# Patient Record
Sex: Male | Born: 1950
Health system: Southern US, Community
[De-identification: ages and names within clinical notes are randomized; demographics above are authoritative.]

## PROBLEM LIST (undated history)

## (undated) DIAGNOSIS — G4730 Sleep apnea, unspecified: Secondary | ICD-10-CM

## (undated) DIAGNOSIS — D509 Iron deficiency anemia, unspecified: Secondary | ICD-10-CM

## (undated) DIAGNOSIS — D573 Sickle-cell trait: Secondary | ICD-10-CM

## (undated) DIAGNOSIS — T7840XA Allergy, unspecified, initial encounter: Secondary | ICD-10-CM

## (undated) DIAGNOSIS — E785 Hyperlipidemia, unspecified: Secondary | ICD-10-CM

## (undated) DIAGNOSIS — I10 Essential (primary) hypertension: Secondary | ICD-10-CM

## (undated) DIAGNOSIS — K5790 Diverticulosis of intestine, part unspecified, without perforation or abscess without bleeding: Secondary | ICD-10-CM

## (undated) DIAGNOSIS — M1990 Unspecified osteoarthritis, unspecified site: Secondary | ICD-10-CM

## (undated) DIAGNOSIS — G4733 Obstructive sleep apnea (adult) (pediatric): Secondary | ICD-10-CM

## (undated) HISTORY — PX: POLYPECTOMY: SHX149

## (undated) HISTORY — DX: Diverticulosis of intestine, part unspecified, without perforation or abscess without bleeding: K57.90

## (undated) HISTORY — DX: Allergy, unspecified, initial encounter: T78.40XA

## (undated) HISTORY — PX: COLONOSCOPY: SHX174

## (undated) HISTORY — PX: THROAT SURGERY: SHX803

## (undated) HISTORY — DX: Hyperlipidemia, unspecified: E78.5

## (undated) HISTORY — DX: Sleep apnea, unspecified: G47.30

## (undated) HISTORY — DX: Obstructive sleep apnea (adult) (pediatric): G47.33

## (undated) HISTORY — PX: ROTATOR CUFF REPAIR: SHX139

## (undated) HISTORY — DX: Essential (primary) hypertension: I10

## (undated) HISTORY — DX: Unspecified osteoarthritis, unspecified site: M19.90

## (undated) HISTORY — DX: Sickle-cell trait: D57.3

---
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
## 2004-02-01 ENCOUNTER — Ambulatory Visit: Payer: Self-pay | Admitting: Internal Medicine

## 2004-02-11 ENCOUNTER — Ambulatory Visit: Payer: Self-pay | Admitting: Internal Medicine

## 2004-08-22 ENCOUNTER — Ambulatory Visit: Payer: Self-pay

## 2004-08-22 ENCOUNTER — Ambulatory Visit: Payer: Self-pay | Admitting: Internal Medicine

## 2005-02-09 ENCOUNTER — Ambulatory Visit: Payer: Self-pay | Admitting: Internal Medicine

## 2005-02-16 ENCOUNTER — Ambulatory Visit: Payer: Self-pay | Admitting: Internal Medicine

## 2005-05-05 ENCOUNTER — Ambulatory Visit: Payer: Self-pay | Admitting: Internal Medicine

## 2005-07-03 ENCOUNTER — Ambulatory Visit: Payer: Self-pay | Admitting: Internal Medicine

## 2005-07-18 ENCOUNTER — Ambulatory Visit: Payer: Self-pay | Admitting: Internal Medicine

## 2005-07-19 ENCOUNTER — Ambulatory Visit: Payer: Self-pay | Admitting: Internal Medicine

## 2005-07-20 ENCOUNTER — Ambulatory Visit (HOSPITAL_COMMUNITY): Admission: RE | Admit: 2005-07-20 | Discharge: 2005-07-20 | Payer: Self-pay | Admitting: Internal Medicine

## 2005-08-11 ENCOUNTER — Ambulatory Visit: Payer: Self-pay | Admitting: Internal Medicine

## 2005-09-04 ENCOUNTER — Ambulatory Visit: Payer: Self-pay | Admitting: Internal Medicine

## 2006-02-12 ENCOUNTER — Ambulatory Visit: Payer: Self-pay | Admitting: Internal Medicine

## 2006-02-12 LAB — CONVERTED CEMR LAB
Albumin: 4 g/dL (ref 3.5–5.2)
Basophils Absolute: 0 10*3/uL (ref 0.0–0.1)
Basophils Relative: 0.5 % (ref 0.0–1.0)
CO2: 28 meq/L (ref 19–32)
Creatinine, Ser: 1.2 mg/dL (ref 0.4–1.5)
GFR calc non Af Amer: 66 mL/min
Glucose, Bld: 96 mg/dL (ref 70–99)
HCT: 36.1 % — ABNORMAL LOW (ref 39.0–52.0)
HDL: 26.2 mg/dL — ABNORMAL LOW (ref >39.0)
Hemoglobin, Urine: NEGATIVE
Hemoglobin: 12.1 g/dL — ABNORMAL LOW (ref 13.0–17.0)
MCV: 86.6 fL (ref 78.0–100.0)
Monocytes Absolute: 0.3 10*3/uL (ref 0.2–0.7)
Monocytes Relative: 7.6 % (ref 3.0–11.0)
PSA: 2.37 ng/mL (ref 0.10–4.00)
Potassium: 3.8 meq/L (ref 3.5–5.1)
RDW: 12.8 % (ref 11.5–14.6)
Sodium: 144 meq/L (ref 135–145)
TSH: 0.73 microintl units/mL (ref 0.35–5.50)
Total Bilirubin: 0.6 mg/dL (ref 0.3–1.2)
Total Protein, Urine: NEGATIVE mg/dL
Total Protein: 6.9 g/dL (ref 6.0–8.3)
WBC: 3.8 10*3/uL — ABNORMAL LOW (ref 4.5–10.5)

## 2006-02-19 ENCOUNTER — Ambulatory Visit: Payer: Self-pay | Admitting: Internal Medicine

## 2006-03-01 ENCOUNTER — Ambulatory Visit: Payer: Self-pay

## 2006-10-31 ENCOUNTER — Encounter: Payer: Self-pay | Admitting: Internal Medicine

## 2006-10-31 DIAGNOSIS — I10 Essential (primary) hypertension: Secondary | ICD-10-CM | POA: Insufficient documentation

## 2007-02-22 ENCOUNTER — Ambulatory Visit: Payer: Self-pay | Admitting: Internal Medicine

## 2007-02-22 LAB — CONVERTED CEMR LAB
ALT: 27 units/L (ref 0–53)
Albumin: 3.9 g/dL (ref 3.5–5.2)
Alkaline Phosphatase: 60 units/L (ref 39–117)
BUN: 14 mg/dL (ref 6–23)
CO2: 31 meq/L (ref 19–32)
Cholesterol: 129 mg/dL (ref 0–200)
Eosinophils Relative: 2.5 % (ref 0.0–5.0)
GFR calc Af Amer: 88 mL/min
Glucose, Bld: 102 mg/dL — ABNORMAL HIGH (ref 70–99)
HDL: 15.6 mg/dL — ABNORMAL LOW (ref >39.0)
Hemoglobin, Urine: NEGATIVE
Ketones, ur: NEGATIVE mg/dL
LDL Cholesterol: 90 mg/dL (ref 0–99)
Neutro Abs: 2.7 10*3/uL (ref 1.4–7.7)
Neutrophils Relative %: 57.4 % (ref 43.0–77.0)
Potassium: 3.8 meq/L (ref 3.5–5.1)
RBC: 4.31 M/uL (ref 4.22–5.81)
Sodium: 143 meq/L (ref 135–145)
Specific Gravity, Urine: 1.015 (ref 1.000–1.03)
TSH: 0.82 microintl units/mL (ref 0.35–5.50)
Total Protein, Urine: NEGATIVE mg/dL
Total Protein: 7.1 g/dL (ref 6.0–8.3)
Triglycerides: 115 mg/dL (ref 0–149)
Urobilinogen, UA: 0.2 (ref 0.0–1.0)
WBC: 4.7 10*3/uL (ref 4.5–10.5)
pH: 6 (ref 5.0–8.0)

## 2007-03-08 ENCOUNTER — Ambulatory Visit: Payer: Self-pay | Admitting: Internal Medicine

## 2007-03-09 DIAGNOSIS — I872 Venous insufficiency (chronic) (peripheral): Secondary | ICD-10-CM | POA: Insufficient documentation

## 2007-03-09 DIAGNOSIS — K5730 Diverticulosis of large intestine without perforation or abscess without bleeding: Secondary | ICD-10-CM | POA: Insufficient documentation

## 2007-03-09 DIAGNOSIS — Z860100 Personal history of colon polyps, unspecified: Secondary | ICD-10-CM | POA: Insufficient documentation

## 2007-03-09 DIAGNOSIS — I739 Peripheral vascular disease, unspecified: Secondary | ICD-10-CM

## 2007-03-13 ENCOUNTER — Telehealth: Payer: Self-pay | Admitting: Internal Medicine

## 2007-05-01 ENCOUNTER — Ambulatory Visit: Payer: Self-pay | Admitting: Internal Medicine

## 2007-05-01 DIAGNOSIS — J310 Chronic rhinitis: Secondary | ICD-10-CM | POA: Insufficient documentation

## 2007-05-01 DIAGNOSIS — J309 Allergic rhinitis, unspecified: Secondary | ICD-10-CM | POA: Insufficient documentation

## 2007-07-02 ENCOUNTER — Telehealth: Payer: Self-pay | Admitting: Endocrinology

## 2007-08-20 ENCOUNTER — Telehealth (INDEPENDENT_AMBULATORY_CARE_PROVIDER_SITE_OTHER): Payer: Self-pay | Admitting: *Deleted

## 2007-08-20 DIAGNOSIS — R5381 Other malaise: Secondary | ICD-10-CM

## 2007-08-20 DIAGNOSIS — R5383 Other fatigue: Secondary | ICD-10-CM | POA: Insufficient documentation

## 2007-08-26 ENCOUNTER — Ambulatory Visit: Payer: Self-pay | Admitting: Internal Medicine

## 2007-08-26 LAB — CONVERTED CEMR LAB
BUN: 19 mg/dL (ref 6–23)
CO2: 26 meq/L (ref 19–32)
Chloride: 111 meq/L (ref 96–112)
Cholesterol: 123 mg/dL (ref 0–200)
Creatinine, Ser: 1.2 mg/dL (ref 0.4–1.5)
GFR calc Af Amer: 80 mL/min
Glucose, Bld: 103 mg/dL — ABNORMAL HIGH (ref 70–99)
Sodium: 144 meq/L (ref 135–145)
Total CHOL/HDL Ratio: 6.9
Triglycerides: 184 mg/dL — ABNORMAL HIGH (ref 0–149)
VLDL: 37 mg/dL (ref 0–40)

## 2007-09-04 ENCOUNTER — Ambulatory Visit: Payer: Self-pay | Admitting: Internal Medicine

## 2007-09-04 DIAGNOSIS — R351 Nocturia: Secondary | ICD-10-CM | POA: Insufficient documentation

## 2007-09-04 DIAGNOSIS — E782 Mixed hyperlipidemia: Secondary | ICD-10-CM | POA: Insufficient documentation

## 2007-09-04 DIAGNOSIS — E785 Hyperlipidemia, unspecified: Secondary | ICD-10-CM

## 2007-10-22 ENCOUNTER — Ambulatory Visit: Payer: Self-pay | Admitting: Internal Medicine

## 2007-10-22 DIAGNOSIS — T07XXXA Unspecified multiple injuries, initial encounter: Secondary | ICD-10-CM | POA: Insufficient documentation

## 2007-10-30 ENCOUNTER — Ambulatory Visit: Payer: Self-pay | Admitting: Internal Medicine

## 2007-10-30 DIAGNOSIS — L02619 Cutaneous abscess of unspecified foot: Secondary | ICD-10-CM | POA: Insufficient documentation

## 2007-10-30 DIAGNOSIS — L03119 Cellulitis of unspecified part of limb: Secondary | ICD-10-CM

## 2007-10-30 DIAGNOSIS — H538 Other visual disturbances: Secondary | ICD-10-CM | POA: Insufficient documentation

## 2007-10-30 DIAGNOSIS — M109 Gout, unspecified: Secondary | ICD-10-CM | POA: Insufficient documentation

## 2007-10-31 LAB — CONVERTED CEMR LAB: Uric Acid, Serum: 6.4 mg/dL (ref 4.0–7.8)

## 2007-11-05 ENCOUNTER — Encounter: Payer: Self-pay | Admitting: Internal Medicine

## 2007-11-06 ENCOUNTER — Ambulatory Visit: Payer: Self-pay | Admitting: Internal Medicine

## 2007-11-06 DIAGNOSIS — M25579 Pain in unspecified ankle and joints of unspecified foot: Secondary | ICD-10-CM | POA: Insufficient documentation

## 2007-12-12 ENCOUNTER — Encounter: Payer: Self-pay | Admitting: Internal Medicine

## 2008-02-28 ENCOUNTER — Encounter: Payer: Self-pay | Admitting: Internal Medicine

## 2008-02-28 ENCOUNTER — Telehealth (INDEPENDENT_AMBULATORY_CARE_PROVIDER_SITE_OTHER): Payer: Self-pay | Admitting: *Deleted

## 2008-03-09 ENCOUNTER — Ambulatory Visit (HOSPITAL_COMMUNITY): Admission: RE | Admit: 2008-03-09 | Discharge: 2008-03-10 | Payer: Self-pay | Admitting: Orthopedic Surgery

## 2008-03-19 ENCOUNTER — Ambulatory Visit: Payer: Self-pay | Admitting: Internal Medicine

## 2008-03-19 LAB — CONVERTED CEMR LAB
ALT: 18 units/L (ref 0–53)
Albumin: 4 g/dL (ref 3.5–5.2)
Alkaline Phosphatase: 65 units/L (ref 39–117)
Bilirubin Urine: NEGATIVE
Chloride: 101 meq/L (ref 96–112)
GFR calc Af Amer: 80 mL/min
GFR calc non Af Amer: 66 mL/min
HDL: 18.7 mg/dL — ABNORMAL LOW (ref >39.0)
Hemoglobin, Urine: NEGATIVE
Hemoglobin: 13.3 g/dL (ref 13.0–17.0)
LDL Cholesterol: 95 mg/dL (ref 0–99)
MCV: 90.4 fL (ref 78.0–100.0)
Nitrite: NEGATIVE
PSA: 2.27 ng/mL (ref 0.10–4.00)
Platelets: 208 10*3/uL (ref 150–400)
Specific Gravity, Urine: 1.015 (ref 1.000–1.03)
TSH: 0.8 microintl units/mL (ref 0.35–5.50)
Total Protein: 7.4 g/dL (ref 6.0–8.3)
Urobilinogen, UA: 0.2 (ref 0.0–1.0)
WBC: 4.4 10*3/uL — ABNORMAL LOW (ref 4.5–10.5)
pH: 5.5 (ref 5.0–8.0)

## 2008-03-26 ENCOUNTER — Encounter: Admission: RE | Admit: 2008-03-26 | Discharge: 2008-06-24 | Payer: Self-pay | Admitting: Orthopedic Surgery

## 2008-03-30 ENCOUNTER — Ambulatory Visit: Payer: Self-pay | Admitting: Internal Medicine

## 2008-06-25 ENCOUNTER — Encounter: Admission: RE | Admit: 2008-06-25 | Discharge: 2008-07-03 | Payer: Self-pay | Admitting: Orthopedic Surgery

## 2009-03-24 ENCOUNTER — Ambulatory Visit: Payer: Self-pay | Admitting: Internal Medicine

## 2009-03-24 LAB — CONVERTED CEMR LAB
ALT: 29 units/L (ref 0–53)
Albumin: 4.1 g/dL (ref 3.5–5.2)
Calcium: 9 mg/dL (ref 8.4–10.5)
Chloride: 108 meq/L (ref 96–112)
Cholesterol: 131 mg/dL (ref 0–200)
Creatinine, Ser: 1.4 mg/dL (ref 0.4–1.5)
Direct LDL: 79.2 mg/dL
GFR calc non Af Amer: 66.34 mL/min (ref >60)
Glucose, Bld: 105 mg/dL — ABNORMAL HIGH (ref 70–99)
HCT: 40.4 % (ref 39.0–52.0)
Hemoglobin, Urine: NEGATIVE
Hemoglobin: 13.4 g/dL (ref 13.0–17.0)
Lymphocytes Relative: 42.9 % (ref 12.0–46.0)
Lymphs Abs: 1.5 10*3/uL (ref 0.7–4.0)
MCHC: 33.3 g/dL (ref 30.0–36.0)
Neutro Abs: 1.6 10*3/uL (ref 1.4–7.7)
Nitrite: NEGATIVE
Potassium: 4.1 meq/L (ref 3.5–5.1)
RBC: 4.34 M/uL (ref 4.22–5.81)
Total Bilirubin: 0.9 mg/dL (ref 0.3–1.2)
Total CHOL/HDL Ratio: 6
Total Protein, Urine: NEGATIVE mg/dL
Total Protein: 7.3 g/dL (ref 6.0–8.3)
Urobilinogen, UA: 0.2 (ref 0.0–1.0)
VLDL: 40.4 mg/dL — ABNORMAL HIGH (ref 0.0–40.0)
WBC: 3.5 10*3/uL — ABNORMAL LOW (ref 4.5–10.5)
pH: 5.5 (ref 5.0–8.0)

## 2009-04-01 ENCOUNTER — Ambulatory Visit: Payer: Self-pay | Admitting: Internal Medicine

## 2009-04-01 DIAGNOSIS — M549 Dorsalgia, unspecified: Secondary | ICD-10-CM | POA: Insufficient documentation

## 2009-04-02 ENCOUNTER — Encounter: Payer: Self-pay | Admitting: Internal Medicine

## 2009-04-27 ENCOUNTER — Encounter: Payer: Self-pay | Admitting: Internal Medicine

## 2009-04-27 ENCOUNTER — Ambulatory Visit: Payer: Self-pay

## 2009-05-04 ENCOUNTER — Telehealth: Payer: Self-pay | Admitting: Internal Medicine

## 2009-05-26 ENCOUNTER — Ambulatory Visit: Payer: Self-pay | Admitting: Internal Medicine

## 2009-05-26 DIAGNOSIS — L989 Disorder of the skin and subcutaneous tissue, unspecified: Secondary | ICD-10-CM | POA: Insufficient documentation

## 2009-05-26 DIAGNOSIS — R6882 Decreased libido: Secondary | ICD-10-CM | POA: Insufficient documentation

## 2010-03-04 ENCOUNTER — Ambulatory Visit: Payer: Self-pay | Admitting: Internal Medicine

## 2010-03-04 LAB — CONVERTED CEMR LAB
ALT: 20 units/L (ref 0–53)
AST: 20 units/L (ref 0–37)
BUN: 17 mg/dL (ref 6–23)
Basophils Absolute: 0 10*3/uL (ref 0.0–0.1)
Basophils Relative: 0.4 % (ref 0.0–3.0)
Bilirubin Urine: NEGATIVE
Blood, UA: NEGATIVE
CO2: 27 meq/L (ref 19–32)
Calcium: 8.8 mg/dL (ref 8.4–10.5)
Chloride: 107 meq/L (ref 96–112)
Cholesterol: 152 mg/dL (ref 0–200)
GFR calc non Af Amer: 74.69 mL/min (ref >60.00)
Glucose, Bld: 81 mg/dL (ref 70–99)
HDL: 25.8 mg/dL — ABNORMAL LOW (ref >39.00)
Hemoglobin: 12.8 g/dL — ABNORMAL LOW (ref 13.0–17.0)
Ketones, ur: NEGATIVE mg/dL
Leukocytes, UA: NEGATIVE
Lymphocytes Relative: 32.9 % (ref 12.0–46.0)
Monocytes Absolute: 0.3 10*3/uL (ref 0.1–1.0)
Potassium: 3.9 meq/L (ref 3.5–5.1)
RBC: 4.27 M/uL (ref 4.22–5.81)
RDW: 14.2 % (ref 11.5–14.6)
Specific Gravity, Urine: 1.01 (ref 1.000–1.030)
Total Bilirubin: 0.5 mg/dL (ref 0.3–1.2)
Total CHOL/HDL Ratio: 6
Total Protein: 6.7 g/dL (ref 6.0–8.3)
Urine Glucose: NEGATIVE mg/dL
Urobilinogen, UA: 0.2 (ref 0.0–1.0)
pH: 5.5 (ref 5.0–8.0)
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
## 2010-04-12 NOTE — Progress Notes (Signed)
Summary: rx alt  Phone Note Call from Patient Call back at Home Phone [phone]   Caller: Patient [phone] Summary of Call: pt called stating that [facility] [address] told pt that generic Colchicine is no longer available and pt cannot afford brand name Colcrys. pt is requesting alternative, has enough to lalst until MDs return. Initial call taken by: Margaret Pyle, CMA,  May 04, 2009 3:17 PM  Follow-up for Phone Call        ok to increase the allopurinol 100 mg - 2 per day - done escript Follow-up by: Corwin Levins MD,  May 04, 2009 5:29 PM  Additional Follow-up for Phone Call Additional follow up Details #1::        pt informed Additional Follow-up by: Margaret Pyle, CMA,  May 05, 2009 8:14 AM    New/Updated Medications: ALLOPURINOL 100 MG  TABS (ALLOPURINOL) Take 2 tablet by mouth once a day Prescriptions: ALLOPURINOL 100 MG  TABS (ALLOPURINOL) Take 2 tablet by mouth once a day  #60 x 11   Entered and Authorized by:   Corwin Levins MD   Signed by:   Corwin Levins MD on 05/04/2009   Method used:   Electronically to        [facility]  [address] # [id number]* (retail)       [address]       [place], [place]  [address]       Ph: [phone] or [phone]       Fax: [phone]   RxID:   [id number]
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
## 2010-04-12 NOTE — Miscellaneous (Signed)
Summary: Orders Update  Clinical Lists Changes  Orders: Added new Test order of Carotid Duplex (Carotid Duplex) - Signed 

## 2010-04-12 NOTE — Progress Notes (Signed)
Summary: PSA readings/Patient  PSA readings/Patient   Imported By: Sherian Rein 04/01/2009 14:55:22  _____________________________________________________________________  External Attachment:    Type:   Image     Comment:   External Document
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
## 2010-07-19 ENCOUNTER — Other Ambulatory Visit: Payer: Self-pay | Admitting: Internal Medicine
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
## 2010-08-20 ENCOUNTER — Encounter: Payer: Self-pay | Admitting: Internal Medicine

## 2010-09-12 ENCOUNTER — Ambulatory Visit: Payer: Managed Care, Other (non HMO) | Attending: Orthopedic Surgery | Admitting: Physical Therapy

## 2010-09-12 DIAGNOSIS — M2560 Stiffness of unspecified joint, not elsewhere classified: Secondary | ICD-10-CM | POA: Insufficient documentation

## 2010-09-12 DIAGNOSIS — IMO0001 Reserved for inherently not codable concepts without codable children: Secondary | ICD-10-CM | POA: Insufficient documentation

## 2010-09-12 DIAGNOSIS — M25519 Pain in unspecified shoulder: Secondary | ICD-10-CM | POA: Insufficient documentation

## 2010-09-15 ENCOUNTER — Ambulatory Visit: Payer: Managed Care, Other (non HMO) | Admitting: Physical Therapy

## 2010-09-20 ENCOUNTER — Ambulatory Visit: Payer: Managed Care, Other (non HMO) | Admitting: Physical Therapy

## 2010-09-22 ENCOUNTER — Ambulatory Visit: Payer: Managed Care, Other (non HMO) | Admitting: Physical Therapy

## 2010-09-26 ENCOUNTER — Ambulatory Visit: Payer: Managed Care, Other (non HMO) | Admitting: Physical Therapy

## 2010-09-28 ENCOUNTER — Ambulatory Visit: Payer: Managed Care, Other (non HMO) | Admitting: Physical Therapy

## 2010-10-06 ENCOUNTER — Ambulatory Visit: Payer: Managed Care, Other (non HMO) | Admitting: Physical Therapy

## 2010-10-10 ENCOUNTER — Encounter: Payer: Managed Care, Other (non HMO) | Admitting: Physical Therapy

## 2010-12-16 LAB — CBC
HCT: 40.9 % (ref 39.0–52.0)
Hemoglobin: 12.8 g/dL — ABNORMAL LOW (ref 13.0–17.0)
MCHC: 33.4 g/dL (ref 30.0–36.0)
MCHC: 33.9 g/dL (ref 30.0–36.0)
MCV: 91.1 fL (ref 78.0–100.0)
Platelets: 141 10*3/uL — ABNORMAL LOW (ref 150–400)
Platelets: 157 10*3/uL (ref 150–400)
RBC: 4.51 MIL/uL (ref 4.22–5.81)
RDW: 13.3 % (ref 11.5–15.5)

## 2010-12-16 LAB — URINALYSIS, ROUTINE W REFLEX MICROSCOPIC
Nitrite: NEGATIVE
Urobilinogen, UA: 0.2 mg/dL (ref 0.0–1.0)

## 2010-12-16 LAB — BASIC METABOLIC PANEL
BUN: 12 mg/dL (ref 6–23)
Calcium: 9.2 mg/dL (ref 8.4–10.5)
Creatinine, Ser: 1.21 mg/dL (ref 0.4–1.5)
Creatinine, Ser: 1.37 mg/dL (ref 0.4–1.5)
GFR calc Af Amer: >60 mL/min (ref >60)
GFR calc non Af Amer: 53 mL/min — ABNORMAL LOW (ref >60)
Glucose, Bld: 121 mg/dL — ABNORMAL HIGH (ref 70–99)
Sodium: 143 mEq/L (ref 135–145)

## 2010-12-16 LAB — DIFFERENTIAL
Basophils Absolute: 0 10*3/uL (ref 0.0–0.1)
Eosinophils Relative: 3 % (ref 0–5)
Lymphs Abs: 1.7 10*3/uL (ref 0.7–4.0)
Monocytes Absolute: 0.3 10*3/uL (ref 0.1–1.0)
Monocytes Relative: 6 % (ref 3–12)
Neutrophils Relative %: 50 % (ref 43–77)

## 2010-12-16 LAB — PROTIME-INR: INR: 1 (ref 0.00–1.49)

## 2011-01-09 ENCOUNTER — Encounter: Payer: Self-pay | Admitting: Internal Medicine

## 2011-01-11 ENCOUNTER — Other Ambulatory Visit: Payer: Self-pay | Admitting: Internal Medicine

## 2011-01-17 ENCOUNTER — Telehealth: Payer: Self-pay | Admitting: Internal Medicine

## 2011-01-17 DIAGNOSIS — Z1289 Encounter for screening for malignant neoplasm of other sites: Secondary | ICD-10-CM

## 2011-01-17 DIAGNOSIS — Z0000 Encounter for general adult medical examination without abnormal findings: Secondary | ICD-10-CM

## 2011-01-17 NOTE — Telephone Encounter (Signed)
Put order in for physical labs. 

## 2011-01-24 ENCOUNTER — Ambulatory Visit (AMBULATORY_SURGERY_CENTER): Payer: Managed Care, Other (non HMO)

## 2011-01-24 VITALS — Ht 70.0 in | Wt 286.6 lb

## 2011-01-24 DIAGNOSIS — Z1211 Encounter for screening for malignant neoplasm of colon: Secondary | ICD-10-CM

## 2011-01-24 DIAGNOSIS — Z86010 Personal history of colonic polyps: Secondary | ICD-10-CM

## 2011-01-24 MED ORDER — PEG-KCL-NACL-NASULF-NA ASC-C 100 G PO SOLR: 1.0000 | Freq: Once | ORAL | Status: AC

## 2011-02-13 ENCOUNTER — Other Ambulatory Visit (INDEPENDENT_AMBULATORY_CARE_PROVIDER_SITE_OTHER): Payer: Managed Care, Other (non HMO)

## 2011-02-13 ENCOUNTER — Ambulatory Visit (AMBULATORY_SURGERY_CENTER): Payer: Managed Care, Other (non HMO) | Admitting: Internal Medicine

## 2011-02-13 ENCOUNTER — Encounter: Payer: Self-pay | Admitting: Internal Medicine

## 2011-02-13 VITALS — BP 140/76 | HR 52 | Temp 97.6°F | Resp 14 | Ht 70.0 in | Wt 286.0 lb

## 2011-02-13 DIAGNOSIS — K5730 Diverticulosis of large intestine without perforation or abscess without bleeding: Secondary | ICD-10-CM

## 2011-02-13 DIAGNOSIS — Z0000 Encounter for general adult medical examination without abnormal findings: Secondary | ICD-10-CM

## 2011-02-13 DIAGNOSIS — Z1289 Encounter for screening for malignant neoplasm of other sites: Secondary | ICD-10-CM

## 2011-02-13 DIAGNOSIS — Z86010 Personal history of colonic polyps: Secondary | ICD-10-CM

## 2011-02-13 DIAGNOSIS — Z1211 Encounter for screening for malignant neoplasm of colon: Secondary | ICD-10-CM

## 2011-02-13 LAB — CBC WITH DIFFERENTIAL/PLATELET
Basophils Relative: 0.5 % (ref 0.0–3.0)
Eosinophils Absolute: 0.1 10*3/uL (ref 0.0–0.7)
Eosinophils Relative: 1.6 % (ref 0.0–5.0)
HCT: 41.8 % (ref 39.0–52.0)
Lymphs Abs: 1.8 10*3/uL (ref 0.7–4.0)
MCHC: 34 g/dL (ref 30.0–36.0)
MCV: 90.8 fl (ref 78.0–100.0)
Monocytes Absolute: 0.3 10*3/uL (ref 0.1–1.0)
Neutrophils Relative %: 43.8 % (ref 43.0–77.0)
Platelets: 160 10*3/uL (ref 150.0–400.0)
RBC: 4.6 Mil/uL (ref 4.22–5.81)
WBC: 3.9 10*3/uL — ABNORMAL LOW (ref 4.5–10.5)

## 2011-02-13 LAB — URINALYSIS, ROUTINE W REFLEX MICROSCOPIC
Ketones, ur: NEGATIVE
Leukocytes, UA: NEGATIVE
Specific Gravity, Urine: 1.01 (ref 1.000–1.030)
Urobilinogen, UA: 0.2 (ref 0.0–1.0)

## 2011-02-13 LAB — BASIC METABOLIC PANEL
BUN: 12 mg/dL (ref 6–23)
Calcium: 8.9 mg/dL (ref 8.4–10.5)
GFR: 78.02 mL/min (ref >60.00)
Glucose, Bld: 88 mg/dL (ref 70–99)

## 2011-02-13 LAB — LIPID PANEL
Cholesterol: 143 mg/dL (ref 0–200)
LDL Cholesterol: 89 mg/dL (ref 0–99)
Total CHOL/HDL Ratio: 6

## 2011-02-13 LAB — TSH: TSH: 0.67 u[IU]/mL (ref 0.35–5.50)

## 2011-02-13 LAB — HEPATIC FUNCTION PANEL
Albumin: 4.2 g/dL (ref 3.5–5.2)
Total Bilirubin: 1.2 mg/dL (ref 0.3–1.2)

## 2011-02-13 MED ORDER — SODIUM CHLORIDE 0.9 % IV SOLN: 500.0000 mL | INTRAVENOUS | Status: DC

## 2011-02-13 NOTE — Progress Notes (Signed)
Patient did not experience any of the following events: a burn prior to discharge; a fall within the facility; wrong site/side/patient/procedure/implant event; or a hospital transfer or hospital admission upon discharge from the facility. (G8907) Patient did not have preoperative order for IV antibiotic SSI prophylaxis. (G8918)  

## 2011-02-13 NOTE — Patient Instructions (Signed)
Please refer to blue and green discharge instruction sheets. 

## 2011-02-14 ENCOUNTER — Telehealth: Payer: Self-pay

## 2011-02-14 NOTE — Telephone Encounter (Signed)

## 2011-02-28 ENCOUNTER — Encounter: Payer: Self-pay | Admitting: Internal Medicine

## 2011-02-28 DIAGNOSIS — Z0000 Encounter for general adult medical examination without abnormal findings: Secondary | ICD-10-CM | POA: Insufficient documentation

## 2011-03-06 ENCOUNTER — Encounter: Payer: Self-pay | Admitting: Internal Medicine

## 2011-03-06 ENCOUNTER — Ambulatory Visit (INDEPENDENT_AMBULATORY_CARE_PROVIDER_SITE_OTHER): Payer: Managed Care, Other (non HMO) | Admitting: Internal Medicine

## 2011-03-06 VITALS — BP 124/86 | HR 61 | Temp 98.1°F | Ht 69.0 in | Wt 284.4 lb

## 2011-03-06 DIAGNOSIS — I10 Essential (primary) hypertension: Secondary | ICD-10-CM

## 2011-03-06 DIAGNOSIS — Z0000 Encounter for general adult medical examination without abnormal findings: Secondary | ICD-10-CM

## 2011-03-06 MED ORDER — SILDENAFIL CITRATE 100 MG PO TABS: 100.0000 mg | ORAL_TABLET | ORAL | Status: DC | PRN

## 2011-03-06 MED ORDER — FLUTICASONE PROPIONATE 50 MCG/ACT NA SUSP: 2.0000 | Freq: Every day | NASAL | Status: DC

## 2011-03-06 NOTE — Patient Instructions (Signed)
Continue all other medications as before Your refills were sent as requested Please have the pharmacy call if you need further refills Please return in 1 year for your yearly visit, or sooner if needed, with Lab testing done 3-5 days before

## 2011-03-06 NOTE — Assessment & Plan Note (Addendum)

## 2011-03-07 ENCOUNTER — Encounter: Payer: Self-pay | Admitting: Internal Medicine

## 2011-03-07 NOTE — Assessment & Plan Note (Signed)
stable overall by hx and exam, most recent data reviewed with pt, and pt to continue medical treatment as before  BP Readings from Last 3 Encounters:  03/06/11 124/86  02/13/11 140/76  03/04/10 120/84

## 2011-03-07 NOTE — Progress Notes (Signed)
Subjective:    Patient ID: Calvin [name], male    DOB: [birth year], 60 y.o.   MRN: [id number]  HPI  Here for wellness and f/u;  Overall doing ok;  Pt denies CP, worsening SOB, DOE, wheezing, orthopnea, PND, worsening LE edema, palpitations, dizziness or syncope.  Pt denies neurological change such as new Headache, facial or extremity weakness.  Pt denies polydipsia, polyuria, or low sugar symptoms. Pt states overall good compliance with treatment and medications, good tolerability, and trying to follow lower cholesterol diet.  Pt denies worsening depressive symptoms, suicidal ideation or panic. No fever, wt loss, night sweats, loss of appetite, or other constitutional symptoms.  Pt states good ability with ADL's, low fall risk, home safety reviewed and adequate, no significant changes in hearing or vision, and occasionally active with exercise.  Needs flonase refill, and tx recently with colcrys for gout. Past Medical History  Diagnosis Date  . Hyperlipidemia   . Hypertension   . Gout   . Diverticulosis    Past Surgical History  Procedure Date  . Rotator cuff repair     left  . Throat surgery     polyps removed from vocal cord  . Polypectomy   . Colonoscopy     reports that he quit smoking about 33 years ago. He has never used smokeless tobacco. He reports that he does not drink alcohol or use illicit drugs. family history is not on file. No Known Allergies Current Outpatient Prescriptions on File Prior to Visit  Medication Sig Dispense Refill  . allopurinol (ZYLOPRIM) 100 MG tablet Take 100 mg by mouth daily. Take 2 tabs daily       . aspirin 81 MG tablet Take 81 mg by mouth daily.        . Coenzyme Q10 (CO Q 10 PO) Take by mouth daily.        . fish oil-omega-3 fatty acids 1000 MG capsule Take 2 g by mouth daily.        Marland Kitchen glucosamine-chondroitin 500-400 MG tablet Take 1 tablet by mouth daily.        . indomethacin (INDOCIN) 50 MG capsule Take 50 mg by mouth 3 (three) times daily as  needed.        . meloxicam (MOBIC) 15 MG tablet Take 15 mg by mouth daily.        Marland Kitchen olmesartan-hydrochlorothiazide (BENICAR HCT) 40-12.5 MG per tablet Take 1 tablet by mouth daily.        . pravastatin (PRAVACHOL) 40 MG tablet Take 40 mg by mouth daily.        . verapamil (VERELAN PM) 200 MG 24 hr capsule Take 200 mg by mouth at bedtime.        . vitamin C (ASCORBIC ACID) 500 MG tablet Take 500 mg by mouth daily.         Current Facility-Administered Medications on File Prior to Visit  Medication Dose Route Frequency Provider Last Rate Last Dose  . 0.9 %  sodium chloride infusion  500 mL Intravenous Continuous Yancey Flemings, MD       Review of Systems Review of Systems  Constitutional: Negative for diaphoresis, activity change, appetite change and unexpected weight change.  HENT: Negative for hearing loss, ear pain, facial swelling, mouth sores and neck stiffness.   Eyes: Negative for pain, redness and visual disturbance.  Respiratory: Negative for shortness of breath and wheezing.   Cardiovascular: Negative for chest pain and palpitations.  Gastrointestinal: Negative for diarrhea, blood in stool,  abdominal distention and rectal pain.  Genitourinary: Negative for hematuria, flank pain and decreased urine volume.  Musculoskeletal: Negative for myalgias and joint swelling.  Skin: Negative for color change and wound.  Neurological: Negative for syncope and numbness.  Hematological: Negative for adenopathy.  Psychiatric/Behavioral: Negative for hallucinations, self-injury, decreased concentration and agitation.      Objective:   Physical Exam BP 124/86  Pulse 61  Temp(Src) 98.1 F (36.7 C) (Oral)  Ht 5\' 9"  (1.753 m)  Wt 284 lb 6 oz (128.992 kg)  BMI 41.99 kg/m2  SpO2 97% Physical Exam  VS noted Constitutional: Pt is oriented to person, place, and time. Appears well-developed and well-nourished.  HENT:  Head: Normocephalic and atraumatic.  Right Ear: External ear normal.  Left Ear:  External ear normal.  Nose: Nose normal.  Mouth/Throat: Oropharynx is clear and moist.  Eyes: Conjunctivae and EOM are normal. Pupils are equal, round, and reactive to light.  Neck: Normal range of motion. Neck supple. No JVD present. No tracheal deviation present.  Cardiovascular: Normal rate, regular rhythm, normal heart sounds and intact distal pulses.   Pulmonary/Chest: Effort normal and breath sounds normal.  Abdominal: Soft. Bowel sounds are normal. There is no tenderness.  Musculoskeletal: Normal range of motion. Exhibits no edema.  Lymphadenopathy:  Has no cervical adenopathy.  Neurological: Pt is alert and oriented to person, place, and time. Pt has normal reflexes. No cranial nerve deficit.  Skin: Skin is warm and dry. No rash noted.  Psychiatric:  Has  normal mood and affect. Behavior is normal.     Assessment & Plan:

## 2011-03-10 ENCOUNTER — Other Ambulatory Visit: Payer: Self-pay | Admitting: *Deleted

## 2011-03-10 MED ORDER — OLMESARTAN MEDOXOMIL-HCTZ 40-12.5 MG PO TABS: 1.0000 | ORAL_TABLET | Freq: Every day | ORAL | Status: DC

## 2011-03-10 MED ORDER — ALLOPURINOL 100 MG PO TABS: 200.0000 mg | ORAL_TABLET | Freq: Every day | ORAL | Status: DC

## 2011-03-10 MED ORDER — VERAPAMIL HCL 200 MG PO CP24: 200.0000 mg | ORAL_CAPSULE | Freq: Every day | ORAL | Status: DC

## 2011-03-10 MED ORDER — PRAVASTATIN SODIUM 40 MG PO TABS: 40.0000 mg | ORAL_TABLET | Freq: Every day | ORAL | Status: DC

## 2011-03-10 MED ORDER — MELOXICAM 15 MG PO TABS: 15.0000 mg | ORAL_TABLET | Freq: Every day | ORAL | Status: DC

## 2011-03-10 NOTE — Telephone Encounter (Signed)
Pt needs written rx for Allopurinol, Meloxicam, Benicar HCT, Pravastatin and Verapamil to send to mail order company. Please call pt once rx is ready.

## 2011-03-13 MED ORDER — VERAPAMIL HCL 200 MG PO CP24: 200.0000 mg | ORAL_CAPSULE | Freq: Every day | ORAL | Status: DC

## 2011-03-13 MED ORDER — MELOXICAM 15 MG PO TABS: 15.0000 mg | ORAL_TABLET | Freq: Every day | ORAL | Status: DC

## 2011-03-13 MED ORDER — ALLOPURINOL 100 MG PO TABS: 100.0000 mg | ORAL_TABLET | Freq: Every day | ORAL | Status: DC

## 2011-03-13 MED ORDER — OLMESARTAN MEDOXOMIL-HCTZ 40-12.5 MG PO TABS: 1.0000 | ORAL_TABLET | Freq: Every day | ORAL | Status: DC

## 2011-03-13 MED ORDER — PRAVASTATIN SODIUM 40 MG PO TABS: 40.0000 mg | ORAL_TABLET | Freq: Every day | ORAL | Status: DC

## 2011-03-13 NOTE — Telephone Encounter (Signed)
Patient informed written prescriptions requested are ready for pickup at front desk.

## 2011-03-24 ENCOUNTER — Telehealth: Payer: Self-pay | Admitting: *Deleted

## 2011-03-24 MED ORDER — ALLOPURINOL 100 MG PO TABS: 200.0000 mg | ORAL_TABLET | Freq: Every day | ORAL | Status: DC

## 2011-03-24 NOTE — Telephone Encounter (Signed)
rx corrected 

## 2011-03-24 NOTE — Telephone Encounter (Signed)
Clarification requested for patient's Allopurinol SIG: Ref #[id number] SIG: sent in read: Take 1 tablet (100 mg total) by mouth daily. Take 2 tabs daily  Please advise.

## 2011-03-24 NOTE — Telephone Encounter (Signed)
Addended by: Corwin Levins on: 03/24/2011 05:24 PM   Modules accepted: Orders

## 2011-05-08 ENCOUNTER — Telehealth: Payer: Self-pay

## 2011-05-08 MED ORDER — ALLOPURINOL 100 MG PO TABS: 100.0000 mg | ORAL_TABLET | Freq: Every day | ORAL | Status: DC

## 2011-05-08 NOTE — Telephone Encounter (Signed)
Sent prescription requested to pharmacy.

## 2011-05-31 ENCOUNTER — Other Ambulatory Visit: Payer: Self-pay | Admitting: Internal Medicine

## 2011-08-04 ENCOUNTER — Telehealth: Payer: Self-pay

## 2011-08-04 NOTE — Telephone Encounter (Signed)
Pt called stating that since retiring, the cost of Benicar has dramatically increased - $300+. Pt is requesting a cheaper alternative. Pt aware that MD is out of office until 05/28 and is willing to wait for his recommendation.

## 2011-08-08 MED ORDER — VALSARTAN-HYDROCHLOROTHIAZIDE 320-12.5 MG PO TABS: 1.0000 | ORAL_TABLET | Freq: Every day | ORAL | Status: DC

## 2011-08-08 NOTE — Telephone Encounter (Signed)
Called the patient informed of prescription change and sent to pharmacy per pt. Request.

## 2011-08-08 NOTE — Telephone Encounter (Signed)
Ok to change to generic diovan hct  - done per emr to [facility]

## 2011-08-17 ENCOUNTER — Other Ambulatory Visit: Payer: Self-pay | Admitting: Internal Medicine

## 2011-09-27 ENCOUNTER — Ambulatory Visit (INDEPENDENT_AMBULATORY_CARE_PROVIDER_SITE_OTHER): Payer: Managed Care, Other (non HMO) | Admitting: Internal Medicine

## 2011-09-27 ENCOUNTER — Encounter: Payer: Self-pay | Admitting: Internal Medicine

## 2011-09-27 VITALS — BP 132/82 | HR 76 | Temp 98.1°F | Ht 68.5 in | Wt 278.0 lb

## 2011-09-27 DIAGNOSIS — M109 Gout, unspecified: Secondary | ICD-10-CM

## 2011-09-27 MED ORDER — PREDNISONE (PAK) 10 MG PO TABS: 10.0000 mg | ORAL_TABLET | ORAL | Status: AC

## 2011-09-27 NOTE — Assessment & Plan Note (Signed)
Hx same, R 5th digit, R wrist and L knee/toes delay in starting colchine and Indomethacin due to vacation Will treat with pred pak now Education/information provided

## 2011-09-27 NOTE — Progress Notes (Signed)
  Subjective:    Patient ID: Calvin [name], male    DOB: 11-20-1950, 61 y.o.   MRN: [id number]  HPI  complains of gout flare - R hand: wrist and little finger Onset while on vacation - delay in starting indocin/colchine Improved but not resolved pain and swelling on tx Hx prior steroids with rapid resolution  Past Medical History  Diagnosis Date  . Hyperlipidemia   . Hypertension   . Gout   . Diverticulosis     Review of Systems  Constitutional: Negative for fever and fatigue.  Musculoskeletal: Positive for joint swelling. Negative for myalgias and back pain.  Skin: Negative for rash and wound.       Objective:   Physical Exam BP 132/82  Pulse 76  Temp 98.1 F (36.7 C) (Oral)  Ht 5' 8.5" (1.74 m)  Wt 278 lb (126.1 kg)  BMI 41.65 kg/m2  SpO2 96% Gen: overweight, NAD MSkel: R 5th digit with mild diffuse swelling and warmth, R ulnar side of wrist with mild swelling/warmth - FROM but painful      Assessment & Plan:  Gout flare - tx steroids

## 2011-09-27 NOTE — Patient Instructions (Signed)
It was good to see you today. Prednisone taper x 6 days for gout symptoms (use in place of other 2 meds for next 6 days) -  Your prescription(s) have been submitted to your pharmacy. Please take as directed and contact our office if you believe you are having problem(s) with the medication(s). Gout Gout is an inflammatory condition (arthritis) caused by a buildup of uric acid crystals in the joints. Uric acid is a chemical that is normally present in the blood. Under some circumstances, uric acid can form into crystals in your joints. This causes joint redness, soreness, and swelling (inflammation). Repeat attacks are common. Over time, uric acid crystals can form into masses (tophi) near a joint, causing disfigurement. Gout is treatable and often preventable. CAUSES   The disease begins with elevated levels of uric acid in the blood. Uric acid is produced by your body when it breaks down a naturally found substance called purines. This also happens when you eat certain foods such as meats and fish. Causes of an elevated uric acid level include:  Being passed down from parent to child (heredity).   Diseases that cause increased uric acid production (obesity, psoriasis, some cancers).   Excessive alcohol use.   Diet, especially diets rich in meat and seafood.   Medicines, including certain cancer-fighting drugs (chemotherapy), diuretics, and aspirin.   Chronic kidney disease. The kidneys are no longer able to remove uric acid well.   Problems with metabolism.  Conditions strongly associated with gout include:  Obesity.   High blood pressure.   High cholesterol.   Diabetes.  Not everyone with elevated uric acid levels gets gout. It is not understood why some people get gout and others do not. Surgery, joint injury, and eating too much of certain foods are some of the factors that can lead to gout. SYMPTOMS    An attack of gout comes on quickly. It causes intense pain with redness,  swelling, and warmth in a joint.   Fever can occur.   Often, only one joint is involved. Certain joints are more commonly involved:   Base of the big toe.   Knee.   Ankle.   Wrist.   Finger.  Without treatment, an attack usually goes away in a few days to weeks. Between attacks, you usually will not have symptoms, which is different from many other forms of arthritis. DIAGNOSIS   Your caregiver will suspect gout based on your symptoms and exam. Removal of fluid from the joint (arthrocentesis) is done to check for uric acid crystals. Your caregiver will give you a medicine that numbs the area (local anesthetic) and use a needle to remove joint fluid for exam. Gout is confirmed when uric acid crystals are seen in joint fluid, using a special microscope. Sometimes, blood, urine, and X-ray tests are also used. TREATMENT   There are 2 phases to gout treatment: treating the sudden onset (acute) attack and preventing attacks (prophylaxis). Treatment of an Acute Attack  Medicines are used. These include anti-inflammatory medicines or steroid medicines.   An injection of steroid medicine into the affected joint is sometimes necessary.   The painful joint is rested. Movement can worsen the arthritis.   You may use warm or cold treatments on painful joints, depending which works best for you.   Discuss the use of coffee, vitamin C, or cherries with your caregiver. These may be helpful treatment options.  Treatment to Prevent Attacks After the acute attack subsides, your caregiver may advise prophylactic  medicine. These medicines either help your kidneys eliminate uric acid from your body or decrease your uric acid production. You may need to stay on these medicines for a very long time. The early phase of treatment with prophylactic medicine can be associated with an increase in acute gout attacks. For this reason, during the first few months of treatment, your caregiver may also advise you to  take medicines usually used for acute gout treatment. Be sure you understand your caregiver's directions. You should also discuss dietary treatment with your caregiver. Certain foods such as meats and fish can increase uric acid levels. Other foods such as dairy can decrease levels. Your caregiver can give you a list of foods to avoid. HOME CARE INSTRUCTIONS    Do not take aspirin to relieve pain. This raises uric acid levels.   Only take over-the-counter or prescription medicines for pain, discomfort, or fever as directed by your caregiver.   Rest the joint as much as possible. When in bed, keep sheets and blankets off painful areas.   Keep the affected joint raised (elevated).   Use crutches if the painful joint is in your leg.   Drink enough water and fluids to keep your urine clear or pale yellow. This helps your body get rid of uric acid. Do not drink alcoholic beverages. They slow the passage of uric acid.   Follow your caregiver's dietary instructions. Pay careful attention to the amount of protein you eat. Your daily diet should emphasize fruits, vegetables, whole grains, and fat-free or low-fat milk products.   Maintain a healthy body weight.  SEEK MEDICAL CARE IF:    You have an oral temperature above 102 F (38.9 C).   You develop diarrhea, vomiting, or any side effects from medicines.   You do not feel better in 24 hours, or you are getting worse.  SEEK IMMEDIATE MEDICAL CARE IF:    Your joint becomes suddenly more tender and you have:   Chills.   An oral temperature above 102 F (38.9 C), not controlled by medicine.  MAKE SURE YOU:    Understand these instructions.   Will watch your condition.   Will get help right away if you are not doing well or get worse.  Document Released: 02/25/2000 Document Revised: 02/16/2011 Document Reviewed: 06/07/2009 [facility] Patient Information 2012 [place], [place].

## 2011-10-09 ENCOUNTER — Telehealth: Payer: Self-pay

## 2011-10-09 MED ORDER — PREDNISONE (PAK) 10 MG PO TABS: 10.0000 mg | ORAL_TABLET | ORAL | Status: AC

## 2011-10-09 NOTE — Telephone Encounter (Signed)
Pt called stating that prednisone helped with gout sxs but a few days after completing course, swelling and tenderness returned, more to his foot than hand. Pt is requesting MD advisement, should he repeat prednisone course?

## 2011-10-09 NOTE — Telephone Encounter (Signed)
Pt advised of Rx and MD's recommendations.

## 2011-10-09 NOTE — Telephone Encounter (Signed)
Ok to repeat pred course - erx done - OV to reeval if worse or unimproved

## 2011-12-13 ENCOUNTER — Telehealth: Payer: Self-pay

## 2011-12-13 MED ORDER — IRBESARTAN-HYDROCHLOROTHIAZIDE 300-12.5 MG PO TABS: 1.0000 | ORAL_TABLET | Freq: Every day | ORAL | Status: DC

## 2011-12-13 NOTE — Telephone Encounter (Signed)
Done erx 

## 2011-12-13 NOTE — Telephone Encounter (Signed)
Received PA for Benicar HCT tab. 40-12.5.  Advise to proceed or offer alternative

## 2011-12-13 NOTE — Telephone Encounter (Signed)
Called the patient and he is ok to change. 

## 2011-12-13 NOTE — Telephone Encounter (Signed)
Ok to ask pt if ok to change to avalide 300/12.5 as this is generic and similar tx

## 2011-12-20 ENCOUNTER — Telehealth: Payer: Self-pay | Admitting: Internal Medicine

## 2011-12-20 NOTE — Telephone Encounter (Signed)
Calvin [name] to contact pt  We received his recent PSA of 2.97 from [facility] lab - sept 16, 2031  His PSA has ranged here at our lab in the past 5 yrs from low of 1.98 to 2.56  The level this time is the highest he has had, but done at a different lab and not overly concerning for the increase in PSA "velocity" (rate of increase over time)  He is due for OV in 3 mo, has labs in computer including the PSA.  Please ask pt to make an appt for 3 mo, and have labs completed for further consideration of need for urology referral

## 2011-12-20 NOTE — Telephone Encounter (Signed)
Called patient informed of results and MD instructions on lab and appt.  He agreed to all instructions.

## 2012-02-12 ENCOUNTER — Ambulatory Visit (INDEPENDENT_AMBULATORY_CARE_PROVIDER_SITE_OTHER): Payer: Managed Care, Other (non HMO) | Admitting: Internal Medicine

## 2012-02-12 ENCOUNTER — Encounter: Payer: Self-pay | Admitting: Internal Medicine

## 2012-02-12 VITALS — BP 132/98 | HR 68 | Ht 69.0 in | Wt 282.0 lb

## 2012-02-12 DIAGNOSIS — R42 Dizziness and giddiness: Secondary | ICD-10-CM

## 2012-02-12 DIAGNOSIS — Z136 Encounter for screening for cardiovascular disorders: Secondary | ICD-10-CM

## 2012-02-12 MED ORDER — MECLIZINE HCL 50 MG PO TABS: 50.0000 mg | ORAL_TABLET | Freq: Three times a day (TID) | ORAL | Status: DC | PRN

## 2012-02-12 NOTE — Progress Notes (Signed)
Subjective:    Patient ID: Calvin [name], male    DOB: 1950-05-18, 61 y.o.   MRN: [id number]  HPI  Pt presents to the clinic today with c/o dizziness and lightheadedness. This started this morning when he woke up to go to the bathroom. He felt as if the room was spinning, so he grabbed onto the dresser. He did not fall. The dizziness and lightheadedness resolved after about 15 minutes. He is feeling well now. He does c/o of ear fullness as well. He denies nausea, vomiting, chest pain, chest tightness or SOB.  Review of Systems  Past Medical History  Diagnosis Date  . Hyperlipidemia   . Hypertension   . Gout   . Diverticulosis     Current Outpatient Prescriptions  Medication Sig Dispense Refill  . allopurinol (ZYLOPRIM) 100 MG tablet Take 1 tablet (100 mg total) by mouth daily.  90 tablet  3  . aspirin 81 MG tablet Take 81 mg by mouth daily.        . Coenzyme Q10 (CO Q 10 PO) Take by mouth daily.        Marland Kitchen COLCRYS 0.6 MG tablet take 1 tablet by mouth once daily if needed  30 tablet  11  . fish oil-omega-3 fatty acids 1000 MG capsule Take 2 g by mouth daily.        . fluticasone (FLONASE) 50 MCG/ACT nasal spray Place 2 sprays into the nose daily.  48 g  3  . glucosamine-chondroitin 500-400 MG tablet Take 1 tablet by mouth daily.        . indomethacin (INDOCIN) 50 MG capsule take 1 capsule by mouth three times a day  90 capsule  3  . irbesartan-hydrochlorothiazide (AVALIDE) 300-12.5 MG per tablet Take 1 tablet by mouth daily.  90 tablet  3  . meloxicam (MOBIC) 15 MG tablet Take 1 tablet (15 mg total) by mouth daily.  90 tablet  3  . pravastatin (PRAVACHOL) 40 MG tablet Take 1 tablet (40 mg total) by mouth daily.  90 tablet  3  . sildenafil (VIAGRA) 100 MG tablet Take 1 tablet (100 mg total) by mouth as needed for erectile dysfunction.  5 tablet  11  . Verapamil HCl CR 200 MG CP24 TAKE 1 CAPSULE ONCE DAILY  90 capsule  3  . vitamin C (ASCORBIC ACID) 500 MG tablet Take 500 mg by mouth  daily.          No Known Allergies  No family history on file.  History   Social History  . Marital Status: Married    Spouse Name: N/A    Number of Children: N/A  . Years of Education: N/A   Occupational History  . Not on file.   Social History Main Topics  . Smoking status: Former Smoker -- 5 years    Quit date: 05/23/1977  . Smokeless tobacco: Never Used  . Alcohol Use: No  . Drug Use: No     Comment: Completed program in april 2000  . Sexually Active: Not on file   Other Topics Concern  . Not on file   Social History Narrative  . No narrative on file     Constitutional: Denies fever, malaise, fatigue, headache or abrupt weight changes.  HEENT: Pt reports ear fullness. Denies eye pain, eye redness, ear pain, ringing in the ears, wax buildup, runny nose, nasal congestion, bloody nose, or sore throat. Respiratory: Denies difficulty breathing, shortness of breath, cough or sputum production.   Cardiovascular:  Denies chest pain, chest tightness, palpitations or swelling in the hands or feet.  Neurological: Pt reports dizziness and lightheadedness. Denies difficulty with memory, difficulty with speech or problems with balance and coordination.   No other specific complaints in a complete review of systems (except as listed in HPI above).     Objective:   Physical Exam  BP 132/98  Pulse 68  Ht 5\' 9"  (1.753 m)  Wt 282 lb (127.914 kg)  BMI 41.64 kg/m2  SpO2 97% Wt Readings from Last 3 Encounters:  02/12/12 282 lb (127.914 kg)  09/27/11 278 lb (126.1 kg)  03/06/11 284 lb 6 oz (128.992 kg)    General: Appears his stated age, well developed, well nourished in NAD. HEENT: Head: normal shape and size; Eyes: sclera white, no icterus, conjunctiva pink, PERRLA and EOMs intact; Ears: Tm's gray and intact, normal light reflex, cerumen impacted bilaterally R>L; Nose: mucosa pink and moist, septum midline; Throat/Mouth: Teeth present, mucosa pink and moist, no exudate,  lesions or ulcerations noted.  Cardiovascular: Normal rate and rhythm. S1,S2 noted.  No murmur, rubs or gallops noted. No JVD or BLE edema. No carotid bruits noted. Pulmonary/Chest: Normal effort and positive vesicular breath sounds. No respiratory distress. No wheezes, rales or ronchi noted.  Musculoskeletal: Normal range of motion. No signs of joint swelling. No difficulty with gait. Negative Rhomberg. Neurological: Alert and oriented. Cranial nerves II-XII intact. Coordination normal. +DTRs bilaterally.    EKG: WNL  BMET    Component Value Date/Time   NA 139 02/13/2011 1009   K 3.9 02/13/2011 1009   CL 104 02/13/2011 1009   CO2 28 02/13/2011 1009   GLUCOSE 88 02/13/2011 1009   GLUCOSE 96 02/12/2006 0738   BUN 12 02/13/2011 1009   CREATININE 1.2 02/13/2011 1009   CALCIUM 8.9 02/13/2011 1009   GFRNONAA 74.69 03/04/2010 0956   GFRAA 80 03/19/2008 0855    Lipid Panel     Component Value Date/Time   CHOL 143 02/13/2011 1009   TRIG 155.0* 02/13/2011 1009   HDL 23.40* 02/13/2011 1009   CHOLHDL 6 02/13/2011 1009   VLDL 31.0 02/13/2011 1009   LDLCALC 89 02/13/2011 1009    CBC    Component Value Date/Time   WBC 3.9* 02/13/2011 1009   RBC 4.60 02/13/2011 1009   HGB 14.2 02/13/2011 1009   HCT 41.8 02/13/2011 1009   PLT 160.0 02/13/2011 1009   MCV 90.8 02/13/2011 1009   MCHC 34.0 02/13/2011 1009   RDW 13.4 02/13/2011 1009   LYMPHSABS 1.8 02/13/2011 1009   MONOABS 0.3 02/13/2011 1009   EOSABS 0.1 02/13/2011 1009   BASOSABS 0.0 02/13/2011 1009    Hgb A1C No results found for this basename: HGBA1C       Assessment & Plan:   Dizziness and Lightheadedness, likely due to cerumen impaction, new onset with additional workup required:  Will obtain EKG to r/o cardiac ischemia Ears lavaged today eRx given for antivert in case dizziness returns  RTC as needed. If symptoms persist or if you develop chest pain, chest pressure, nausea, vomiting or shortness of breath, go to the ER immeadiatly.

## 2012-02-12 NOTE — Patient Instructions (Addendum)
Dizziness Dizziness is a common problem. It is a feeling of unsteadiness or lightheadedness. You may feel like you are about to faint. Dizziness can lead to injury if you stumble or fall. A person of any age group can suffer from dizziness, but dizziness is more common in older adults. CAUSES   Dizziness can be caused by many different things, including:  Middle ear problems.   Standing for too long.   Infections.   An allergic reaction.   Aging.   An emotional response to something, such as the sight of blood.   Side effects of medicines.   Fatigue.   Problems with circulation or blood pressure.   Excess use of alcohol, medicines, or illegal drug use.   Breathing too fast (hyperventilation).   An arrhythmia or problems with your heart rhythm.   Low red blood cell count (anemia).   Pregnancy.   Vomiting, diarrhea, fever, or other illnesses that cause dehydration.   Diseases or conditions such as Parkinson's disease, high blood pressure (hypertension), diabetes, and thyroid problems.   Exposure to extreme heat.  DIAGNOSIS   To find the cause of your dizziness, your caregiver may do a physical exam, lab tests, radiologic imaging scans, or an electrocardiography test (ECG).   TREATMENT   Treatment of dizziness depends on the cause of your symptoms and can vary greatly. HOME CARE INSTRUCTIONS    Drink enough fluids to keep your urine clear or pale yellow. This is especially important in very hot weather. In the elderly, it is also important in cold weather.   If your dizziness is caused by medicines, take them exactly as directed. When taking blood pressure medicines, it is especially important to get up slowly.   Rise slowly from chairs and steady yourself until you feel okay.   In the morning, first sit up on the side of the bed. When this seems okay, stand slowly while holding onto something until you know your balance is fine.   If you need to stand in one place  for a long time, be sure to move your legs often. Tighten and relax the muscles in your legs while standing.   If dizziness continues to be a problem, have someone stay with you for a day or two. Do this until you feel you are well enough to stay alone. Have the person call your caregiver if he or she notices changes in you that are concerning.   Do not drive or use heavy machinery if you feel dizzy.   Do not drink alcohol.  SEEK IMMEDIATE MEDICAL CARE IF:    Your dizziness or lightheadedness gets worse.   You feel nauseous or vomit.   You develop problems with talking, walking, weakness, or using your arms, hands, or legs.   You are not thinking clearly or you have difficulty forming sentences. It may take a friend or family member to determine if your thinking is normal.   You develop chest pain, abdominal pain, shortness of breath, or sweating.   Your vision changes.   You notice any bleeding.   You have side effects from medicine that seems to be getting worse rather than better.  MAKE SURE YOU:    Understand these instructions.   Will watch your condition.   Will get help right away if you are not doing well or get worse.  Document Released: 08/23/2000 Document Revised: 05/22/2011 Document Reviewed: 09/16/2010 [facility] Patient Information 2013 [place], [place].   Cerumen Impaction The structures of  the external ear canal secrete a waxy substance known as cerumen. Excess cerumen can build up in the ear canal, causing a condition known as cerumen impaction. Cerumen impaction can cause ear pain as well as disrupt the function of the ear. The rate of cerumen production differs for each individual. For certain individuals, the configuration of one's ear canal may cause him or her to have a decreased ability to naturally remove cerumen. It is important to note that removing cerumen as a part of normal hygiene is not necessary, and the use of swabs in the ear canal is not  recommended. SYMPTOMS    Diminished hearing.   Ear drainage.   Ear pain.   Ear itch.  CAUSES   Excessive cerumen production.   RISK INCREASES WITH:  Frequent use of swabs to clean ears.   Narrow ear canals.   Eczema (a skin condition).   Dehydration.  PREVENTION  Do Not insert objects into the ear, even with the intent of cleaning the ear.   Maintain hydration.   Control eczema if present.  TREATMENT   Maintaining preventative measures is the best way to treat cerumen impaction. If symptoms of cerumen impaction develop the first step is to use over-the-counter or prescription ear drops that are intended to soften the cerumen. If the cerumen does not clear, then visit your caregiver to have the cerumen removed. The most common method for cerumen removal is through irrigation with warm water. Although, some caregivers use ear curettes and other instruments to remove the cerumen physically. In the most severe cases, cerumen may be removed surgically. Document Released: 02/27/2005 Document Revised: 05/22/2011 Document Reviewed: 06/11/2008 [facility] Patient Information 2013 [place], [place].

## 2012-03-11 ENCOUNTER — Other Ambulatory Visit (INDEPENDENT_AMBULATORY_CARE_PROVIDER_SITE_OTHER): Payer: Managed Care, Other (non HMO)

## 2012-03-11 ENCOUNTER — Other Ambulatory Visit: Payer: Self-pay | Admitting: Internal Medicine

## 2012-03-11 DIAGNOSIS — Z0000 Encounter for general adult medical examination without abnormal findings: Secondary | ICD-10-CM

## 2012-03-11 LAB — LIPID PANEL
HDL: 22 mg/dL — ABNORMAL LOW (ref >39.00)
Total CHOL/HDL Ratio: 5
Triglycerides: 181 mg/dL — ABNORMAL HIGH (ref 0.0–149.0)
VLDL: 36.2 mg/dL (ref 0.0–40.0)

## 2012-03-11 LAB — BASIC METABOLIC PANEL
CO2: 31 mEq/L (ref 19–32)
Calcium: 8.8 mg/dL (ref 8.4–10.5)
Glucose, Bld: 108 mg/dL — ABNORMAL HIGH (ref 70–99)
Potassium: 3.5 mEq/L (ref 3.5–5.1)
Sodium: 142 mEq/L (ref 135–145)

## 2012-03-11 LAB — PSA: PSA: 2.48 ng/mL (ref 0.10–4.00)

## 2012-03-11 LAB — HEPATIC FUNCTION PANEL
AST: 29 U/L (ref 0–37)
Albumin: 4 g/dL (ref 3.5–5.2)
Total Bilirubin: 0.9 mg/dL (ref 0.3–1.2)

## 2012-03-11 LAB — URINALYSIS, ROUTINE W REFLEX MICROSCOPIC
Bilirubin Urine: NEGATIVE
Hgb urine dipstick: NEGATIVE
Ketones, ur: NEGATIVE
Leukocytes, UA: NEGATIVE
Specific Gravity, Urine: 1.02 (ref 1.000–1.030)
Urine Glucose: NEGATIVE
Urobilinogen, UA: 0.2 (ref 0.0–1.0)

## 2012-03-11 LAB — TSH: TSH: 0.77 u[IU]/mL (ref 0.35–5.50)

## 2012-03-12 LAB — CBC WITH DIFFERENTIAL/PLATELET
Basophils Absolute: 0 10*3/uL (ref 0.0–0.1)
Eosinophils Absolute: 0.1 10*3/uL (ref 0.0–0.7)
HCT: 40.5 % (ref 39.0–52.0)
Hemoglobin: 13.8 g/dL (ref 13.0–17.0)
Lymphs Abs: 1.2 10*3/uL (ref 0.7–4.0)
MCHC: 33.9 g/dL (ref 30.0–36.0)
MCV: 87.7 fl (ref 78.0–100.0)
Monocytes Absolute: 0.4 10*3/uL (ref 0.1–1.0)
Neutro Abs: 1.5 10*3/uL (ref 1.4–7.7)
Platelets: 152 10*3/uL (ref 150.0–400.0)
RDW: 14.6 % (ref 11.5–14.6)

## 2012-03-18 ENCOUNTER — Encounter: Payer: Self-pay | Admitting: Internal Medicine

## 2012-03-18 ENCOUNTER — Ambulatory Visit (INDEPENDENT_AMBULATORY_CARE_PROVIDER_SITE_OTHER): Payer: Managed Care, Other (non HMO) | Admitting: Internal Medicine

## 2012-03-18 VITALS — BP 140/90 | HR 73 | Temp 96.7°F | Ht 69.0 in | Wt 286.0 lb

## 2012-03-18 DIAGNOSIS — Z0000 Encounter for general adult medical examination without abnormal findings: Secondary | ICD-10-CM

## 2012-03-18 MED ORDER — INDOMETHACIN 50 MG PO CAPS: 50.0000 mg | ORAL_CAPSULE | Freq: Three times a day (TID) | ORAL | Status: DC | PRN

## 2012-03-18 MED ORDER — ALLOPURINOL 100 MG PO TABS: 100.0000 mg | ORAL_TABLET | Freq: Every day | ORAL | Status: DC

## 2012-03-18 MED ORDER — MELOXICAM 15 MG PO TABS: 15.0000 mg | ORAL_TABLET | Freq: Every day | ORAL | Status: DC | PRN

## 2012-03-18 MED ORDER — SILDENAFIL CITRATE 100 MG PO TABS: 100.0000 mg | ORAL_TABLET | ORAL | Status: DC | PRN

## 2012-03-18 MED ORDER — FLUTICASONE PROPIONATE 50 MCG/ACT NA SUSP: 2.0000 | Freq: Every day | NASAL | Status: DC

## 2012-03-18 MED ORDER — VERAPAMIL HCL ER 200 MG PO CP24: 1.0000 | ORAL_CAPSULE | Freq: Every day | ORAL | Status: DC

## 2012-03-18 MED ORDER — IRBESARTAN-HYDROCHLOROTHIAZIDE 300-12.5 MG PO TABS: 1.0000 | ORAL_TABLET | Freq: Every day | ORAL | Status: DC

## 2012-03-18 MED ORDER — COLCHICINE 0.6 MG PO TABS: 0.6000 mg | ORAL_TABLET | Freq: Every day | ORAL | Status: DC

## 2012-03-18 MED ORDER — PRAVASTATIN SODIUM 40 MG PO TABS: 40.0000 mg | ORAL_TABLET | Freq: Every day | ORAL | Status: DC

## 2012-03-18 NOTE — Assessment & Plan Note (Signed)

## 2012-03-18 NOTE — Patient Instructions (Addendum)
Continue all other medications as before You are given all the hardcopy refills today Please continue your efforts at being more active, low cholesterol diet, and weight control. You are otherwise up to date with prevention measures Thank you for enrolling in MyChart. Please follow the instructions below to securely access your online medical record. MyChart allows you to send messages to your doctor, view your test results, renew your prescriptions, schedule appointments, and more. Please return in 1 year for your yearly visit, or sooner if needed, with Lab testing done 3-5 days before

## 2012-03-18 NOTE — Progress Notes (Signed)
Subjective:    Patient ID: Calvin [name], male    DOB: 07-Jan-1951, 62 y.o.   MRN: [id number]  HPI  Here for wellness and f/u;  Overall doing ok;  Pt denies CP, worsening SOB, DOE, wheezing, orthopnea, PND, worsening LE edema, palpitations, dizziness or syncope.  Pt denies neurological change such as new Headache, facial or extremity weakness.  Pt denies polydipsia, polyuria, or low sugar symptoms. Pt states overall good compliance with treatment and medications, good tolerability, and trying to follow lower cholesterol diet.  Pt denies worsening depressive symptoms, suicidal ideation or panic. No fever, wt loss, night sweats, loss of appetite, or other constitutional symptoms.  Pt states good ability with ADL's, low fall risk, home safety reviewed and adequate, no significant changes in hearing or vision, and occasionally active with exercise.  Had been going to the gym 4-5 times per wk until last nov, plans to re-start soon.  Now retired Past Medical History  Diagnosis Date  . Hyperlipidemia   . Hypertension   . Gout   . Diverticulosis    Past Surgical History  Procedure Date  . Rotator cuff repair     left  . Throat surgery     polyps removed from vocal cord  . Polypectomy   . Colonoscopy     reports that he quit smoking about 34 years ago. He has never used smokeless tobacco. He reports that he does not drink alcohol or use illicit drugs. family history is not on file. No Known Allergies Current Outpatient Prescriptions on File Prior to Visit  Medication Sig Dispense Refill  . allopurinol (ZYLOPRIM) 100 MG tablet Take 1 tablet (100 mg total) by mouth daily.  90 tablet  3  . aspirin 81 MG tablet Take 81 mg by mouth daily.        . Coenzyme Q10 (CO Q 10 PO) Take by mouth daily.        . colchicine (COLCRYS) 0.6 MG tablet Take 1 tablet (0.6 mg total) by mouth daily.  90 tablet  3  . fluticasone (FLONASE) 50 MCG/ACT nasal spray Place 2 sprays into the nose daily.  48 g  3  .  glucosamine-chondroitin 500-400 MG tablet Take 1 tablet by mouth daily.        . indomethacin (INDOCIN) 50 MG capsule Take 1 capsule (50 mg total) by mouth 3 (three) times daily as needed.  90 capsule  3  . irbesartan-hydrochlorothiazide (AVALIDE) 300-12.5 MG per tablet Take 1 tablet by mouth daily.  90 tablet  3  . meloxicam (MOBIC) 15 MG tablet Take 1 tablet (15 mg total) by mouth daily as needed for pain.  90 tablet  3  . pravastatin (PRAVACHOL) 40 MG tablet Take 1 tablet (40 mg total) by mouth daily.  90 tablet  3  . sildenafil (VIAGRA) 100 MG tablet Take 1 tablet (100 mg total) by mouth as needed for erectile dysfunction.  10 tablet  11  . Verapamil HCl CR 200 MG CP24 Take 1 capsule (200 mg total) by mouth daily.  90 capsule  3  . vitamin C (ASCORBIC ACID) 500 MG tablet Take 500 mg by mouth daily.         Review of Systems Review of Systems  Constitutional: Negative for diaphoresis, activity change, appetite change and unexpected weight change.  HENT: Negative for hearing loss, ear pain, facial swelling, mouth sores and neck stiffness.   Eyes: Negative for pain, redness and visual disturbance.  Respiratory: Negative for shortness  of breath and wheezing.   Cardiovascular: Negative for chest pain and palpitations.  Gastrointestinal: Negative for diarrhea, blood in stool, abdominal distention and rectal pain.  Genitourinary: Negative for hematuria, flank pain and decreased urine volume.  Musculoskeletal: Negative for myalgias and joint swelling.  Skin: Negative for color change and wound.  Neurological: Negative for syncope and numbness.  Hematological: Negative for adenopathy.  Psychiatric/Behavioral: Negative for hallucinations, self-injury, decreased concentration and agitation.      Objective:   Physical Exam BP 140/90  Pulse 73  Temp 96.7 F (35.9 C) (Oral)  Ht 5\' 9"  (1.753 m)  Wt 286 lb (129.729 kg)  BMI 42.23 kg/m2  SpO2 93% Physical Exam  VS noted Constitutional: Pt is  oriented to person, place, and time. Appears well-developed and well-nourished.  HENT:  Head: Normocephalic and atraumatic.  Right Ear: External ear normal.  Left Ear: External ear normal.  Nose: Nose normal.  Mouth/Throat: Oropharynx is clear and moist.  Eyes: Conjunctivae and EOM are normal. Pupils are equal, round, and reactive to light.  Neck: Normal range of motion. Neck supple. No JVD present. No tracheal deviation present.  Cardiovascular: Normal rate, regular rhythm, normal heart sounds and intact distal pulses.   Pulmonary/Chest: Effort normal and breath sounds normal.  Abdominal: Soft. Bowel sounds are normal. There is no tenderness.  Musculoskeletal: Normal range of motion. Exhibits no edema.  Lymphadenopathy:  Has no cervical adenopathy.  Neurological: Pt is alert and oriented to person, place, and time. Pt has normal reflexes. No cranial nerve deficit.  Skin: Skin is warm and dry. No rash noted.  Psychiatric:  Has  normal mood and affect. Behavior is normal.      Assessment & Plan:

## 2012-07-05 ENCOUNTER — Ambulatory Visit (INDEPENDENT_AMBULATORY_CARE_PROVIDER_SITE_OTHER): Payer: Managed Care, Other (non HMO) | Admitting: Internal Medicine

## 2012-07-05 ENCOUNTER — Ambulatory Visit (INDEPENDENT_AMBULATORY_CARE_PROVIDER_SITE_OTHER)
Admission: RE | Admit: 2012-07-05 | Discharge: 2012-07-05 | Disposition: A | Discharge status: Home / Self Care | Payer: Managed Care, Other (non HMO) | Source: Ambulatory Visit | Attending: Internal Medicine | Admitting: Internal Medicine

## 2012-07-05 ENCOUNTER — Encounter: Payer: Self-pay | Admitting: Internal Medicine

## 2012-07-05 ENCOUNTER — Telehealth: Payer: Self-pay

## 2012-07-05 VITALS — BP 150/90 | HR 70 | Temp 97.0°F | Ht 68.0 in | Wt 284.1 lb

## 2012-07-05 DIAGNOSIS — I10 Essential (primary) hypertension: Secondary | ICD-10-CM

## 2012-07-05 DIAGNOSIS — E785 Hyperlipidemia, unspecified: Secondary | ICD-10-CM

## 2012-07-05 DIAGNOSIS — M25559 Pain in unspecified hip: Secondary | ICD-10-CM

## 2012-07-05 DIAGNOSIS — M25552 Pain in left hip: Secondary | ICD-10-CM

## 2012-07-05 MED ORDER — HYDROCHLOROTHIAZIDE 25 MG PO TABS: 25.0000 mg | ORAL_TABLET | Freq: Every day | ORAL | Status: DC

## 2012-07-05 MED ORDER — NAPROXEN 500 MG PO TABS: 500.0000 mg | ORAL_TABLET | Freq: Two times a day (BID) | ORAL | Status: DC

## 2012-07-05 MED ORDER — IRBESARTAN 300 MG PO TABS: 300.0000 mg | ORAL_TABLET | Freq: Every day | ORAL | Status: DC

## 2012-07-05 MED ORDER — CYCLOBENZAPRINE HCL 10 MG PO TABS
10.0000 mg | ORAL_TABLET | Freq: Three times a day (TID) | ORAL | Status: DC | PRN
Start: 2012-07-05 — End: 2013-11-13

## 2012-07-05 MED ORDER — IRBESARTAN-HYDROCHLOROTHIAZIDE 300-25 MG PO TABS: 1.0000 | ORAL_TABLET | Freq: Every day | ORAL | Status: DC

## 2012-07-05 NOTE — Telephone Encounter (Signed)
Ok for the 2 scripts - done erx

## 2012-07-05 NOTE — Telephone Encounter (Signed)
Avalide 300-25 not available has been 300/12.5 or can change to two separate rx's  Advise please [facility] [address] [place]

## 2012-07-05 NOTE — Patient Instructions (Addendum)
Please take all new medication as prescribed - the muscle relaxer and anti-inflammatory OK to stop the avalide 300/12.5 Please take all new medication as prescribed - the Avalide 300/25 mg - 1 per day Please go to the XRAY Department in the Basement (go straight as you get off the elevator) for the x-ray testing You will be contacted by phone if any changes need to be made immediately.  Otherwise, you will receive a letter about your results with an explanation, but please check with MyChart first. Thank you for enrolling in MyChart. Please follow the instructions below to securely access your online medical record. MyChart allows you to send messages to your doctor, view your test results, renew your prescriptions, schedule appointments, and more.

## 2012-07-05 NOTE — Progress Notes (Signed)
Subjective:    Patient ID: Calvin [name], male    DOB: [birth year], 62 y.o.   MRN: [id number]  HPI  Here to f/u, was moving furniture recently for his mother with some of it heavy requiring squatting, didn't notice any particular pain or strain at the time, but next day and for the past wk has had mild to mod but persistent left prox leg and hip pain and stiffness, worse in the AM to first get up, takes about an hour to "work it out" but still sometimes limps a bit later in the day after sitting for a time.  Worse to flex hip while lying down as well, nothing makes better .  Has not tried OTC nsaid or similar.  No other significant gait change or falls.   Pt denies fever, wt loss, night sweats, loss of appetite, or other constitutional symptoms  Pt denies chest pain, increased sob or doe, wheezing, orthopnea, PND, increased LE swelling, palpitations, dizziness or syncope.   Past Medical History  Diagnosis Date  . Hyperlipidemia   . Hypertension   . Gout   . Diverticulosis    Past Surgical History  Procedure Laterality Date  . Rotator cuff repair      left  . Throat surgery      polyps removed from vocal cord  . Polypectomy    . Colonoscopy      reports that he quit smoking about 35 years ago. He has never used smokeless tobacco. He reports that he does not drink alcohol or use illicit drugs. family history is not on file. No Known Allergies Current Outpatient Prescriptions on File Prior to Visit  Medication Sig Dispense Refill  . allopurinol (ZYLOPRIM) 100 MG tablet Take 1 tablet (100 mg total) by mouth daily.  90 tablet  3  . aspirin 81 MG tablet Take 81 mg by mouth daily.        . Coenzyme Q10 (CO Q 10 PO) Take by mouth daily.        . colchicine (COLCRYS) 0.6 MG tablet Take 1 tablet (0.6 mg total) by mouth daily.  90 tablet  3  . fluticasone (FLONASE) 50 MCG/ACT nasal spray Place 2 sprays into the nose daily.  48 g  3  . glucosamine-chondroitin 500-400 MG tablet Take 1 tablet by  mouth daily.        . indomethacin (INDOCIN) 50 MG capsule Take 1 capsule (50 mg total) by mouth 3 (three) times daily as needed.  90 capsule  3  . meloxicam (MOBIC) 15 MG tablet Take 1 tablet (15 mg total) by mouth daily as needed for pain.  90 tablet  3  . pravastatin (PRAVACHOL) 40 MG tablet Take 1 tablet (40 mg total) by mouth daily.  90 tablet  3  . sildenafil (VIAGRA) 100 MG tablet Take 1 tablet (100 mg total) by mouth as needed for erectile dysfunction.  10 tablet  11  . Verapamil HCl CR 200 MG CP24 Take 1 capsule (200 mg total) by mouth daily.  90 capsule  3  . vitamin C (ASCORBIC ACID) 500 MG tablet Take 500 mg by mouth daily.         No current facility-administered medications on file prior to visit.   Review of Systems  Constitutional: Negative for unexpected weight change, or unusual diaphoresis  HENT: Negative for tinnitus.   Eyes: Negative for photophobia and visual disturbance.  Respiratory: Negative for choking and stridor.   Gastrointestinal: Negative for vomiting and  blood in stool.  Genitourinary: Negative for hematuria and decreased urine volume.  Musculoskeletal: Negative for acute joint swelling Skin: Negative for color change and wound.  Neurological: Negative for tremors and numbness other than noted  Psychiatric/Behavioral: Negative for decreased concentration or  hyperactivity.       Objective:   Physical Exam BP 150/90  Pulse 70  Temp(Src) 97 F (36.1 C) (Oral)  Ht 5\' 8"  (1.727 m)  Wt 284 lb 2 oz (128.878 kg)  BMI 43.21 kg/m2  SpO2 95% VS noted,  Constitutional: Pt appears well-developed and well-nourished.  HENT: Head: NCAT.  Right Ear: External ear normal.  Left Ear: External ear normal.  Eyes: Conjunctivae and EOM are normal. Pupils are equal, round, and reactive to light.  Neck: Normal range of motion. Neck supple.  Cardiovascular: Normal rate and regular rhythm.   Pulmonary/Chest: Effort normal and breath sounds normal.  Abd:  Soft, NT,  non-distended, + BS Left hip with FROM, NT, some discomfort elicited on passive ext rotation only No groin mass, swelling Neurological: Pt is alert. Not confused, motor/dtr intact  Skin: Skin is warm. No erythema. No rash Psychiatric: Pt behavior is normal. Thought content normal.     Assessment & Plan:

## 2012-07-07 DIAGNOSIS — M25559 Pain in unspecified hip: Secondary | ICD-10-CM | POA: Insufficient documentation

## 2012-07-07 NOTE — Assessment & Plan Note (Signed)
Mild uncontrolled,  BP Readings from Last 3 Encounters:  07/05/12 150/90  03/18/12 140/90  02/12/12 132/98   For change avalide 300/12.5 to 300/25 qd,  to f/u any worsening symptoms or concerns

## 2012-07-07 NOTE — Assessment & Plan Note (Signed)
Suspect msk strain, for film to r/o other such as paget, DJD, doubt fracture, for nsaid and muscle relaxer prn

## 2012-07-07 NOTE — Assessment & Plan Note (Signed)
stable overall by history and exam, recent data reviewed with pt, and pt to continue medical treatment as before,  to f/u any worsening symptoms or concerns Lab Results  Component Value Date   LDLCALC 60 03/11/2012

## 2012-07-11 ENCOUNTER — Encounter: Payer: Self-pay | Admitting: Internal Medicine

## 2012-07-16 ENCOUNTER — Encounter: Payer: Self-pay | Admitting: Internal Medicine

## 2012-07-16 NOTE — Telephone Encounter (Signed)
If there is not a lot of pain or fever, it may be ok to be seen on Friday.  I will have the office call. This is likely a different problem than the hip.  Thanks  Harriett Sine to call pt -

## 2012-07-19 ENCOUNTER — Encounter: Payer: Self-pay | Admitting: Internal Medicine

## 2012-07-19 ENCOUNTER — Other Ambulatory Visit (INDEPENDENT_AMBULATORY_CARE_PROVIDER_SITE_OTHER): Payer: Managed Care, Other (non HMO)

## 2012-07-19 ENCOUNTER — Ambulatory Visit (INDEPENDENT_AMBULATORY_CARE_PROVIDER_SITE_OTHER): Payer: Managed Care, Other (non HMO) | Admitting: Internal Medicine

## 2012-07-19 VITALS — BP 122/82 | HR 77 | Temp 97.0°F | Ht 68.0 in | Wt 287.2 lb

## 2012-07-19 DIAGNOSIS — R339 Retention of urine, unspecified: Secondary | ICD-10-CM

## 2012-07-19 DIAGNOSIS — I10 Essential (primary) hypertension: Secondary | ICD-10-CM

## 2012-07-19 DIAGNOSIS — R350 Frequency of micturition: Secondary | ICD-10-CM

## 2012-07-19 LAB — URINALYSIS, ROUTINE W REFLEX MICROSCOPIC
Nitrite: NEGATIVE
Total Protein, Urine: NEGATIVE
Urine Glucose: NEGATIVE
pH: 6 (ref 5.0–8.0)

## 2012-07-19 MED ORDER — TAMSULOSIN HCL 0.4 MG PO CAPS: 0.4000 mg | ORAL_CAPSULE | Freq: Every day | ORAL | Status: DC

## 2012-07-19 NOTE — Progress Notes (Signed)
Subjective:    Patient ID: Calvin [name], male    DOB: 11-18-1950, 62 y.o.   MRN: [id number]  HPI  Here to f/u; overall doing ok,  Pt denies chest pain, increased sob or doe, wheezing, orthopnea, PND, increased LE swelling, palpitations, dizziness or syncope.  Pt denies polydipsia, polyuria, or low sugar symptoms such as weakness or confusion improved with po intake.  Pt denies new neurological symptoms such as new headache, or facial or extremity weakness or numbness.   Pt states overall good compliance with meds, has been trying to follow lower cholesterol diet, with wt overall stable,  but little exercise however.  Does drink plenty of fluids "about 10 glasses a day" and edema resolved with hctz now at 25 mg  but noticed the diuretic effect and dry mouth, and with hard time starting urination with trickling and dribbling, sometime takes 2 min to start urinating/retention   ? Worse with recent med, better later in the day possibly.  Last psa ok dec 2014. Past Medical History  Diagnosis Date  . Hyperlipidemia   . Hypertension   . Gout   . Diverticulosis    Past Surgical History  Procedure Laterality Date  . Rotator cuff repair      left  . Throat surgery      polyps removed from vocal cord  . Polypectomy    . Colonoscopy      reports that he quit smoking about 35 years ago. He has never used smokeless tobacco. He reports that he does not drink alcohol or use illicit drugs. family history is not on file. No Known Allergies Current Outpatient Prescriptions on File Prior to Visit  Medication Sig Dispense Refill  . allopurinol (ZYLOPRIM) 100 MG tablet Take 1 tablet (100 mg total) by mouth daily.  90 tablet  3  . aspirin 81 MG tablet Take 81 mg by mouth daily.        . Coenzyme Q10 (CO Q 10 PO) Take by mouth daily.        . colchicine (COLCRYS) 0.6 MG tablet Take 1 tablet (0.6 mg total) by mouth daily.  90 tablet  3  . cyclobenzaprine (FLEXERIL) 10 MG tablet Take 1 tablet (10 mg total) by  mouth 3 (three) times daily as needed for muscle spasms.  60 tablet  0  . fluticasone (FLONASE) 50 MCG/ACT nasal spray Place 2 sprays into the nose daily.  48 g  3  . glucosamine-chondroitin 500-400 MG tablet Take 1 tablet by mouth daily.        . hydrochlorothiazide (HYDRODIURIL) 25 MG tablet Take 1 tablet (25 mg total) by mouth daily.  90 tablet  3  . indomethacin (INDOCIN) 50 MG capsule Take 1 capsule (50 mg total) by mouth 3 (three) times daily as needed.  90 capsule  3  . irbesartan (AVAPRO) 300 MG tablet Take 1 tablet (300 mg total) by mouth daily.  90 tablet  3  . meloxicam (MOBIC) 15 MG tablet Take 1 tablet (15 mg total) by mouth daily as needed for pain.  90 tablet  3  . naproxen (NAPROSYN) 500 MG tablet Take 1 tablet (500 mg total) by mouth 2 (two) times daily with a meal.  60 tablet  2  . pravastatin (PRAVACHOL) 40 MG tablet Take 1 tablet (40 mg total) by mouth daily.  90 tablet  3  . sildenafil (VIAGRA) 100 MG tablet Take 1 tablet (100 mg total) by mouth as needed for erectile dysfunction.  10 tablet  11  . Verapamil HCl CR 200 MG CP24 Take 1 capsule (200 mg total) by mouth daily.  90 capsule  3  . vitamin C (ASCORBIC ACID) 500 MG tablet Take 500 mg by mouth daily.         No current facility-administered medications on file prior to visit.     Review of Systems  Constitutional: Negative for unexpected weight change, or unusual diaphoresis  HENT: Negative for tinnitus.   Eyes: Negative for photophobia and visual disturbance.  Respiratory: Negative for choking and stridor.   Gastrointestinal: Negative for vomiting and blood in stool.  Genitourinary: Negative for hematuria and decreased urine volume.  Musculoskeletal: Negative for acute joint swelling Skin: Negative for color change and wound.  Neurological: Negative for tremors and numbness other than noted  Psychiatric/Behavioral: Negative for decreased concentration or  hyperactivity.       Objective:   Physical Exam BP  122/82  Pulse 77  Temp(Src) 97 F (36.1 C) (Oral)  Ht 5\' 8"  (1.727 m)  Wt 287 lb 4 oz (130.296 kg)  BMI 43.69 kg/m2  SpO2 97% VS noted,  Constitutional: Pt appears well-developed and well-nourished.  HENT: Head: NCAT.  Right Ear: External ear normal.  Left Ear: External ear normal.  Eyes: Conjunctivae and EOM are normal. Pupils are equal, round, and reactive to light.  Neck: Normal range of motion. Neck supple.  Cardiovascular: Normal rate and regular rhythm.   Pulmonary/Chest: Effort normal and breath sounds normal.  Abd:  Soft, NT, non-distended, + BS Declines DRE Neurological: Pt is alert. Not confused  Skin: Skin is warm. No erythema.  Psychiatric: Pt behavior is normal. Thought content normal.     Assessment & Plan:

## 2012-07-19 NOTE — Patient Instructions (Addendum)
Please take all new medication as prescribed- the generic for flomax Please call after 1-2 wks if not improved, for urology referral Please continue all other medications as before, and refills have been done if requested. Please have the pharmacy call with any other refills you may need. Please go to the LAB in the Basement (turn left off the elevator) for the tests to be done today - just the urine test today You will be contacted by phone if any changes need to be made immediately.  Otherwise, you will receive a letter about your results with an explanation, but please check with MyChart first. Thank you for enrolling in MyChart. Please follow the instructions below to securely access your online medical record. MyChart allows you to send messages to your doctor, view your test results, renew your prescriptions, schedule appointments, and more.

## 2012-07-20 DIAGNOSIS — R350 Frequency of micturition: Secondary | ICD-10-CM | POA: Insufficient documentation

## 2012-07-20 DIAGNOSIS — R339 Retention of urine, unspecified: Secondary | ICD-10-CM | POA: Insufficient documentation

## 2012-07-20 HISTORY — DX: Retention of urine, unspecified: R33.9

## 2012-07-20 HISTORY — DX: Frequency of micturition: R35.0

## 2012-07-20 NOTE — Assessment & Plan Note (Signed)
Exam benign, suspect related to BPH underlying, for UA today,  to f/u any worsening symptoms or concerns

## 2012-07-20 NOTE — Assessment & Plan Note (Addendum)
prob BPH, for flomax asd,  to f/u any worsening symptoms or concerns for urology if not improved in 1-2 wks

## 2012-07-20 NOTE — Assessment & Plan Note (Signed)
Improved and stable overall by history and exam, recent data reviewed with pt, and pt to continue medical treatment as before,  to f/u any worsening symptoms or concerns BP Readings from Last 3 Encounters:  07/19/12 122/82  07/05/12 150/90  03/18/12 140/90

## 2012-07-29 ENCOUNTER — Encounter: Payer: Self-pay | Admitting: Internal Medicine

## 2012-08-20 ENCOUNTER — Encounter: Payer: Self-pay | Admitting: Internal Medicine

## 2012-08-26 ENCOUNTER — Encounter: Payer: Self-pay | Admitting: Internal Medicine

## 2012-08-26 DIAGNOSIS — R7302 Impaired glucose tolerance (oral): Secondary | ICD-10-CM

## 2012-08-29 ENCOUNTER — Other Ambulatory Visit (INDEPENDENT_AMBULATORY_CARE_PROVIDER_SITE_OTHER): Payer: Managed Care, Other (non HMO)

## 2012-08-29 DIAGNOSIS — R7309 Other abnormal glucose: Secondary | ICD-10-CM

## 2012-08-29 DIAGNOSIS — R7302 Impaired glucose tolerance (oral): Secondary | ICD-10-CM

## 2012-08-29 LAB — HEMOGLOBIN A1C: Hgb A1c MFr Bld: 5.8 % (ref 4.6–6.5)

## 2012-11-13 ENCOUNTER — Encounter: Payer: Self-pay | Admitting: Internal Medicine

## 2012-11-13 ENCOUNTER — Ambulatory Visit (INDEPENDENT_AMBULATORY_CARE_PROVIDER_SITE_OTHER): Payer: Managed Care, Other (non HMO) | Admitting: Internal Medicine

## 2012-11-13 ENCOUNTER — Ambulatory Visit (INDEPENDENT_AMBULATORY_CARE_PROVIDER_SITE_OTHER): Payer: Managed Care, Other (non HMO) | Admitting: Family Medicine

## 2012-11-13 ENCOUNTER — Encounter: Payer: Self-pay | Admitting: Family Medicine

## 2012-11-13 VITALS — BP 120/70 | HR 92 | Temp 98.0°F | Ht 69.0 in | Wt 287.4 lb

## 2012-11-13 VITALS — BP 120/70 | HR 92 | Wt 287.0 lb

## 2012-11-13 DIAGNOSIS — M1A2690 Drug-induced chronic gout, unspecified knee, without tophus (tophi): Secondary | ICD-10-CM | POA: Insufficient documentation

## 2012-11-13 DIAGNOSIS — I10 Essential (primary) hypertension: Secondary | ICD-10-CM

## 2012-11-13 DIAGNOSIS — M25569 Pain in unspecified knee: Secondary | ICD-10-CM

## 2012-11-13 DIAGNOSIS — M1A00X0 Idiopathic chronic gout, unspecified site, without tophus (tophi): Secondary | ICD-10-CM

## 2012-11-13 DIAGNOSIS — M25561 Pain in right knee: Secondary | ICD-10-CM | POA: Insufficient documentation

## 2012-11-13 DIAGNOSIS — J069 Acute upper respiratory infection, unspecified: Secondary | ICD-10-CM | POA: Insufficient documentation

## 2012-11-13 DIAGNOSIS — M25562 Pain in left knee: Secondary | ICD-10-CM

## 2012-11-13 MED ORDER — NAPROXEN 500 MG PO TABS: 500.0000 mg | ORAL_TABLET | Freq: Two times a day (BID) | ORAL | Status: DC

## 2012-11-13 MED ORDER — INDOMETHACIN 50 MG PO CAPS: 50.0000 mg | ORAL_CAPSULE | Freq: Three times a day (TID) | ORAL | Status: DC | PRN

## 2012-11-13 MED ORDER — AZITHROMYCIN 250 MG PO TABS: ORAL_TABLET | ORAL | Status: DC

## 2012-11-13 MED ORDER — AMLODIPINE BESYLATE 5 MG PO TABS: 5.0000 mg | ORAL_TABLET | Freq: Every day | ORAL | Status: DC

## 2012-11-13 NOTE — Patient Instructions (Addendum)
Very nice to meet you Do the exercise daily Do colchicine twice daily for 5 days. Wear the brace with activity Stop HCTZ Start amlodipine 5 mg daily. Probably come see Dr. Jonny Ruiz in 2-3 weeks to make sure blood pressure is ok.  Watch your diet, I will give you a handout.  Come see me again in 1 month if still having any pain.  Gout Gout is an inflammatory condition (arthritis) caused by a buildup of uric acid crystals in the joints. Uric acid is a chemical that is normally present in the blood. Under some circumstances, uric acid can form into crystals in your joints. This causes joint redness, soreness, and swelling (inflammation). Repeat attacks are common. Over time, uric acid crystals can form into masses (tophi) near a joint, causing disfigurement. Gout is treatable and often preventable. CAUSES  The disease begins with elevated levels of uric acid in the blood. Uric acid is produced by your body when it breaks down a naturally found substance called purines. This also happens when you eat certain foods such as meats and fish. Causes of an elevated uric acid level include:  Being passed down from parent to child (heredity).  Diseases that cause increased uric acid production (obesity, psoriasis, some cancers).  Excessive alcohol use.  Diet, especially diets rich in meat and seafood.  Medicines, including certain cancer-fighting drugs (chemotherapy), diuretics, and aspirin.  Chronic kidney disease. The kidneys are no longer able to remove uric acid well.  Problems with metabolism. Conditions strongly associated with gout include:  Obesity.  High blood pressure.  High cholesterol.  Diabetes. Not everyone with elevated uric acid levels gets gout. It is not understood why some people get gout and others do not. Surgery, joint injury, and eating too much of certain foods are some of the factors that can lead to gout. SYMPTOMS   An attack of gout comes on quickly. It causes intense  pain with redness, swelling, and warmth in a joint.  Fever can occur.  Often, only one joint is involved. Certain joints are more commonly involved:  Base of the big toe.  Knee.  Ankle.  Wrist.  Finger. Without treatment, an attack usually goes away in a few days to weeks. Between attacks, you usually will not have symptoms, which is different from many other forms of arthritis. DIAGNOSIS  Your caregiver will suspect gout based on your symptoms and exam. Removal of fluid from the joint (arthrocentesis) is done to check for uric acid crystals. Your caregiver will give you a medicine that numbs the area (local anesthetic) and use a needle to remove joint fluid for exam. Gout is confirmed when uric acid crystals are seen in joint fluid, using a special microscope. Sometimes, blood, urine, and X-ray tests are also used. TREATMENT  There are 2 phases to gout treatment: treating the sudden onset (acute) attack and preventing attacks (prophylaxis). Treatment of an Acute Attack  Medicines are used. These include anti-inflammatory medicines or steroid medicines.  An injection of steroid medicine into the affected joint is sometimes necessary.  The painful joint is rested. Movement can worsen the arthritis.  You may use warm or cold treatments on painful joints, depending which works best for you.  Discuss the use of coffee, vitamin C, or cherries with your caregiver. These may be helpful treatment options. Treatment to Prevent Attacks After the acute attack subsides, your caregiver may advise prophylactic medicine. These medicines either help your kidneys eliminate uric acid from your body or decrease  your uric acid production. You may need to stay on these medicines for a very long time. The early phase of treatment with prophylactic medicine can be associated with an increase in acute gout attacks. For this reason, during the first few months of treatment, your caregiver may also advise you  to take medicines usually used for acute gout treatment. Be sure you understand your caregiver's directions. You should also discuss dietary treatment with your caregiver. Certain foods such as meats and fish can increase uric acid levels. Other foods such as dairy can decrease levels. Your caregiver can give you a list of foods to avoid. HOME CARE INSTRUCTIONS   Do not take aspirin to relieve pain. This raises uric acid levels.  Only take over-the-counter or prescription medicines for pain, discomfort, or fever as directed by your caregiver.  Rest the joint as much as possible. When in bed, keep sheets and blankets off painful areas.  Keep the affected joint raised (elevated).  Use crutches if the painful joint is in your leg.  Drink enough water and fluids to keep your urine clear or pale yellow. This helps your body get rid of uric acid. Do not drink alcoholic beverages. They slow the passage of uric acid.  Follow your caregiver's dietary instructions. Pay careful attention to the amount of protein you eat. Your daily diet should emphasize fruits, vegetables, whole grains, and fat-free or low-fat milk products.  Maintain a healthy body weight. SEEK MEDICAL CARE IF:   You have an oral temperature above 102 F (38.9 C).  You develop diarrhea, vomiting, or any side effects from medicines.  You do not feel better in 24 hours, or you are getting worse. SEEK IMMEDIATE MEDICAL CARE IF:   Your joint becomes suddenly more tender and you have:  Chills.  An oral temperature above 102 F (38.9 C), not controlled by medicine. MAKE SURE YOU:   Understand these instructions.  Will watch your condition.  Will get help right away if you are not doing well or get worse. Document Released: 02/25/2000 Document Revised: 05/22/2011 Document Reviewed: 06/07/2009 [facility] Patient Information 2014 [place], [place].

## 2012-11-13 NOTE — Progress Notes (Signed)
I'm seeing this patient by the request  of:  Dr. Jonny Ruiz  CC: Patient is a very pleasant 62 year old male coming in with left knee pain. Patient does have a past medical history significant for gout of his left knee multiple years ago. Patient states recently he has been doing very well but unfortunately he started having some mild swelling as well as pain over the course last couple weeks on the left knee. Patient had been taking indomethacin which has been somewhat helpful. Patient states though that he try to cut back to 2 pills a day unfortunately that started to increase the pain again. Patient's states that he did go to a seafood restaurant recently as well as change in medication. Patient has started hydrochlorothiazide recently. Patient states that this feels somewhat like his gout flare but does not have the swelling or redness that is usually accompanied him. She denies any true injury. Patient describes the pain is more of a dull aching sensation with sharp pain with certain movement. Patient is also noticed he is unable to flex it nearly as much as previously. Patient denies any radiation and denies any numbness or weakness of the leg. Patient states though that the pain can wake him up at night. Patient states that any movement seems to be more her full and once again indomethacin does seem to be somewhat beneficial. Patient was a severity a 7/10. Patient though has also been trying to use the elliptical more to try to lose weight.   Past medical, surgical, family and social history reviewed. Medications reviewed all in the electronic medical record.   Review of Systems: No headache, visual changes, nausea, vomiting, diarrhea, constipation, dizziness, abdominal pain, skin rash, fevers, chills, night sweats, weight loss, swollen lymph nodes, body aches, joint swelling, muscle aches, chest pain, shortness of breath, mood changes.   Objective:    Blood pressure 120/70, pulse 92, weight 287 lb  (130.182 kg), SpO2 97.00%.   General: No apparent distress alert and oriented x3 mood and affect normal, dressed appropriately. Obese male HEENT: Pupils equal, extraocular movements intact Respiratory: Patient's speak in full sentences and does not appear short of breath Cardiovascular: No lower extremity edema, non tender, no erythema Skin: Warm dry intact with no signs of infection or rash on extremities or on axial skeleton. Abdomen: Soft nontender Neuro: Cranial nerves II through XII are intact, neurovascularly intact in all extremities with 2+ DTRs and 2+ pulses. Lymph: No lymphadenopathy of posterior or anterior cervical chain or axillae bilaterally.  Gait normal with good balance and coordination.  MSK: Non tender with full range of motion and good stability and symmetric strength and tone of shoulders, elbows, wrist, hip,  and ankles bilaterally.  Knee: Left Normal to inspection with no erythema but does have trace effusion Palpation normal with no warmth,  but does have joint line tenderness, patellar tenderness, or condyle tenderness. Range of motion shows full extension but only has flexion to 95 compared to 120 and contralateral side. Ligaments with solid consistent endpoints including ACL, PCL, LCL, MCL. Symmetric to contralateral side Negative Mcmurray's, Apley's, and Thessalonian tests. Non painful patellar compression. Patellar glide without crepitus. Patellar and quadriceps tendons unremarkable. Hamstring and quadriceps strength is normal.   MSK US performed of: Left knee This study was ordered, performed, and interpreted by Terrilee Files D.O.  Knee: Left limited exam All structures visualized. Anteromedial, anterolateral, posteromedial, and posterolateral menisci unremarkable without tearing, fraying, , or displacement. Patient though does have some mild hypoechoic  changes surrounding the medial meniscus and this is layering that is consistent with gouty deposits. This is  also seen on the lateral meniscus. No true tear accompanied with the gouty deposits. Patellar Tendon unremarkable on long and transverse views without effusion. Patient though does have a trace effusion of the knee joint itself the No abnormality of prepatellar bursa. LCL and MCL unremarkable on long and transverse views. No abnormality of origin of medial or lateral head of the gastrocnemius.  IMPRESSION:  Gouty deposits with trace knee effusion  Procedure note After informed written and verbal consent, patient was seated on exam table. Left knee was prepped with alcohol swab and utilizing anterolateral approach, patient's left knee space was injected with 2:2:1  Lidocaine 1% :marcaine 0.5%: Kenalog 40mg /dL. Patient tolerated the procedure well without immediate complications.  Impression and Recommendations:     This case required medical decision making of moderate complexity.

## 2012-11-13 NOTE — Patient Instructions (Signed)
Please take all new medication as prescribed - the antibiotic, and the naproxyn for pain Please continue all other medications as before, and refills have been done if requested. Please have the pharmacy call with any other refills you may need.  Please see Dr Katrinka Blazing in our office for your knee pain later today - 330 pm  Please remember to sign up for My Chart if you have not done so, as this will be important to you in the future with finding out test results, communicating by private email, and scheduling acute appointments online when needed.

## 2012-11-13 NOTE — Assessment & Plan Note (Signed)
Injected as described above. I do think patient's likely has exacerbation secondary to his diet as well as potentially the hydrochlorothiazide causing a reabsorption of his uric acid. Patient was changed to a looking today after discussing with primary care provider. Patient was given a brace today and was fitted by me. Strapping was done by me as well. Home exercise program was given Discussed icing protocol Patient will return in one month for further followup.

## 2012-11-13 NOTE — Progress Notes (Signed)
Subjective:    Patient ID: Calvin [name], male    DOB: 22-Jan-1951, 62 y.o.   MRN: [id number]  HPI   Here with 2-3 days acute onset fever, facial pain, ear pain, pressure, headache, general weakness and malaise, and greenish d/c, with mild ST and cough, but pt denies chest pain, wheezing, increased sob or doe, orthopnea, PND, increased LE swelling, palpitations, dizziness or syncope. Pt denies new neurological symptoms such as new headache, or facial or extremity weakness or numbness  Also states mobic not working for bilat left > right knee pain, indocin 50 tid some better but stil pain about 6/10, chronic persistent, without effusion or giveaways or falls, but daily, worse to ambulate/stand. Past Medical History  Diagnosis Date  . Hyperlipidemia   . Hypertension   . Gout   . Diverticulosis    Past Surgical History  Procedure Laterality Date  . Rotator cuff repair      left  . Throat surgery      polyps removed from vocal cord  . Polypectomy    . Colonoscopy      reports that he quit smoking about 35 years ago. He has never used smokeless tobacco. He reports that he does not drink alcohol or use illicit drugs. family history is not on file. No Known Allergies Current Outpatient Prescriptions on File Prior to Visit  Medication Sig Dispense Refill  . allopurinol (ZYLOPRIM) 100 MG tablet Take 1 tablet (100 mg total) by mouth daily.  90 tablet  3  . aspirin 81 MG tablet Take 81 mg by mouth daily.        . Coenzyme Q10 (CO Q 10 PO) Take by mouth daily.        . colchicine (COLCRYS) 0.6 MG tablet Take 1 tablet (0.6 mg total) by mouth daily.  90 tablet  3  . cyclobenzaprine (FLEXERIL) 10 MG tablet Take 1 tablet (10 mg total) by mouth 3 (three) times daily as needed for muscle spasms.  60 tablet  0  . fluticasone (FLONASE) 50 MCG/ACT nasal spray Place 2 sprays into the nose daily.  48 g  3  . glucosamine-chondroitin 500-400 MG tablet Take 1 tablet by mouth daily.        . hydrochlorothiazide  (HYDRODIURIL) 25 MG tablet Take 1 tablet (25 mg total) by mouth daily.  90 tablet  3  . irbesartan (AVAPRO) 300 MG tablet Take 1 tablet (300 mg total) by mouth daily.  90 tablet  3  . meloxicam (MOBIC) 15 MG tablet Take 1 tablet (15 mg total) by mouth daily as needed for pain.  90 tablet  3  . pravastatin (PRAVACHOL) 40 MG tablet Take 1 tablet (40 mg total) by mouth daily.  90 tablet  3  . sildenafil (VIAGRA) 100 MG tablet Take 1 tablet (100 mg total) by mouth as needed for erectile dysfunction.  10 tablet  11  . tamsulosin (FLOMAX) 0.4 MG CAPS Take 1 capsule (0.4 mg total) by mouth daily.  90 capsule  3  . Verapamil HCl CR 200 MG CP24 Take 1 capsule (200 mg total) by mouth daily.  90 capsule  3  . vitamin C (ASCORBIC ACID) 500 MG tablet Take 500 mg by mouth daily.         No current facility-administered medications on file prior to visit.   Review of Systems  Constitutional: Negative for unexpected weight change, or unusual diaphoresis  HENT: Negative for tinnitus.   Eyes: Negative for photophobia and visual  disturbance.  Respiratory: Negative for choking and stridor.   Gastrointestinal: Negative for vomiting and blood in stool.  Genitourinary: Negative for hematuria and decreased urine volume.  Musculoskeletal: Negative for acute joint swelling Skin: Negative for color change and wound.  Neurological: Negative for tremors and numbness other than noted  Psychiatric/Behavioral: Negative for decreased concentration or  hyperactivity.       Objective:   Physical Exam BP 120/70  Pulse 92  Temp(Src) 98 F (36.7 C) (Oral)  Ht 5\' 9"  (1.753 m)  Wt 287 lb 6 oz (130.352 kg)  BMI 42.42 kg/m2  SpO2 97% VS noted,  Constitutional: Pt appears well-developed and well-nourished.  HENT: Head: NCAT.  Right Ear: External ear normal.  Left Ear: External ear normal.  Bilat tm's with mild erythema.  Max sinus areas mild tender.  Pharynx with mild erythema, no exudate Eyes: Conjunctivae and EOM are  normal. Pupils are equal, round, and reactive to light.  Neck: Normal range of motion. Neck supple.  Cardiovascular: Normal rate and regular rhythm.   Pulmonary/Chest: Effort normal and breath sounds normal.  Neurological: Pt is alert. Not confused  Skin: Skin is warm. No erythema.  Psychiatric: Pt behavior is normal. Thought content normal.  Bilat knee crepitus, decreased ROM, no effusion noted    Assessment & Plan:

## 2012-11-17 NOTE — Assessment & Plan Note (Signed)
?   djd vs other, for sports med referral today, pain control,  to f/u any worsening symptoms or concerns

## 2012-11-17 NOTE — Assessment & Plan Note (Signed)
Mild to mod, for antibx course,  to f/u any worsening symptoms or concerns 

## 2012-11-17 NOTE — Assessment & Plan Note (Signed)
stable overall by history and exam, recent data reviewed with pt, and pt to continue medical treatment as before,  to f/u any worsening symptoms or concerns BP Readings from Last 3 Encounters:  11/13/12 120/70  11/13/12 120/70  07/19/12 122/82

## 2012-12-09 ENCOUNTER — Encounter: Payer: Self-pay | Admitting: Internal Medicine

## 2012-12-09 DIAGNOSIS — R972 Elevated prostate specific antigen [PSA]: Secondary | ICD-10-CM

## 2012-12-10 ENCOUNTER — Other Ambulatory Visit (INDEPENDENT_AMBULATORY_CARE_PROVIDER_SITE_OTHER): Payer: Managed Care, Other (non HMO)

## 2012-12-10 DIAGNOSIS — R972 Elevated prostate specific antigen [PSA]: Secondary | ICD-10-CM

## 2012-12-10 LAB — PSA: PSA: 3.08 ng/mL (ref 0.10–4.00)

## 2013-01-20 ENCOUNTER — Ambulatory Visit (INDEPENDENT_AMBULATORY_CARE_PROVIDER_SITE_OTHER): Payer: Managed Care, Other (non HMO) | Admitting: Nurse Practitioner

## 2013-01-20 VITALS — BP 124/78 | HR 67 | Temp 98.1°F | Wt 282.0 lb

## 2013-01-20 DIAGNOSIS — T1490XA Injury, unspecified, initial encounter: Secondary | ICD-10-CM

## 2013-01-20 DIAGNOSIS — IMO0002 Reserved for concepts with insufficient information to code with codable children: Secondary | ICD-10-CM

## 2013-01-20 NOTE — Patient Instructions (Signed)
I removed a small splinter from your foot today. Please prevent infection by soaking foot daily in warm water, peroxide & literene for 3-4 days. Keep covered with band aid. Look at foot daily for signs of infection: redness, red streaks, pain, pus, fever. Pleasure to meet you!

## 2013-01-20 NOTE — Progress Notes (Signed)
Pre-visit discussion using our clinic review tool. No additional management support is needed unless otherwise documented below in the visit note.  

## 2013-01-20 NOTE — Progress Notes (Signed)
Subjective:    Calvin [name] is a 62 y.o. male who presents with right foot pain. Onset of the symptoms was 2 weeks ago. Precipitating event: none known. Current symptoms include: occasional pain in foot, thinks something is in it. Evaluation to date: none. Treatment to date: none.  The following portions of the patient's history were reviewed and updated as appropriate: allergies, current medications, past medical history, past surgical history and problem list.  Review of Systems Constitutional: negative for chills, fatigue, fevers and night sweats Integument/breast: thinks he can feel something in bottom of foot. Does not remember stepping on anything. hurts off & on, depending on position of foot. Musculoskeletal:negative for arthralgias and swelling    Objective:    BP 124/78  Pulse 67  Temp(Src) 98.1 F (36.7 C) (Oral)  Wt 282 lb (127.914 kg)  SpO2 97% Right foot:  discoloration & nodular swelling bottom of r foot, point of entry size of 25 ga needle. Nontender. no pus or bleeding.  Left foot:  normal exam, no swelling, tenderness, instability; ligaments intact, full range of motion of all ankle/foot joints   Splinter removal: cleaned nodular area with betadine swabs X3. Allowed to dry. Used 21 ga needle to open point of entry area. Removed splinter 3/8 " brown. Unable to express puss, blood, or drainage. Cleansed betadine from foot with alcohol pad. Pt tolerated procedure with no c/o pain. Applied bacitracin & band aid.  Assessment & Plan:    Splinter imbedded in R foot. Removed w/no complications. Instructed on foot care & f/u.

## 2013-03-19 ENCOUNTER — Other Ambulatory Visit: Payer: Self-pay | Admitting: Internal Medicine

## 2013-03-19 ENCOUNTER — Telehealth: Payer: Self-pay | Admitting: Internal Medicine

## 2013-03-19 ENCOUNTER — Encounter: Payer: Self-pay | Admitting: Family Medicine

## 2013-03-19 DIAGNOSIS — D582 Other hemoglobinopathies: Secondary | ICD-10-CM

## 2013-03-19 NOTE — Telephone Encounter (Signed)
03/19/2012 Pt dropped off paperwork from [employer] for Dr. Jenny Reichmann to look over before his lab work/CPE next week. Put in Dr.'s box for review.

## 2013-03-19 NOTE — Telephone Encounter (Signed)
For lab due to [facility] info per pt that sickling seen on recent lab screening

## 2013-03-20 ENCOUNTER — Telehealth: Payer: Self-pay | Admitting: Family Medicine

## 2013-03-20 MED ORDER — AMLODIPINE BESYLATE 5 MG PO TABS: 5.0000 mg | ORAL_TABLET | Freq: Every day | ORAL | Status: DC

## 2013-03-20 NOTE — Telephone Encounter (Signed)
Refilled med

## 2013-03-26 ENCOUNTER — Other Ambulatory Visit (INDEPENDENT_AMBULATORY_CARE_PROVIDER_SITE_OTHER): Payer: Managed Care, Other (non HMO)

## 2013-03-26 DIAGNOSIS — Z0000 Encounter for general adult medical examination without abnormal findings: Secondary | ICD-10-CM

## 2013-03-26 DIAGNOSIS — D582 Other hemoglobinopathies: Secondary | ICD-10-CM

## 2013-03-26 LAB — CBC WITH DIFFERENTIAL/PLATELET
BASOS ABS: 0 10*3/uL (ref 0.0–0.1)
BASOS PCT: 0.4 % (ref 0.0–3.0)
EOS PCT: 3 % (ref 0.0–5.0)
Eosinophils Absolute: 0.1 10*3/uL (ref 0.0–0.7)
HCT: 38.5 % — ABNORMAL LOW (ref 39.0–52.0)
HEMOGLOBIN: 13 g/dL (ref 13.0–17.0)
LYMPHS PCT: 39.8 % (ref 12.0–46.0)
Lymphs Abs: 1.4 10*3/uL (ref 0.7–4.0)
MCHC: 33.8 g/dL (ref 30.0–36.0)
MCV: 88 fl (ref 78.0–100.0)
MONOS PCT: 8.5 % (ref 3.0–12.0)
Monocytes Absolute: 0.3 10*3/uL (ref 0.1–1.0)
NEUTROS ABS: 1.7 10*3/uL (ref 1.4–7.7)
Neutrophils Relative %: 48.3 % (ref 43.0–77.0)
Platelets: 185 10*3/uL (ref 150.0–400.0)
RBC: 4.37 Mil/uL (ref 4.22–5.81)
RDW: 13.8 % (ref 11.5–14.6)
WBC: 3.6 10*3/uL — ABNORMAL LOW (ref 4.5–10.5)

## 2013-03-26 LAB — URINALYSIS, ROUTINE W REFLEX MICROSCOPIC
BILIRUBIN URINE: NEGATIVE
Hgb urine dipstick: NEGATIVE
KETONES UR: NEGATIVE
Leukocytes, UA: NEGATIVE
Nitrite: NEGATIVE
RBC / HPF: NONE SEEN (ref 0–?)
Specific Gravity, Urine: 1.02 (ref 1.000–1.030)
TOTAL PROTEIN, URINE-UPE24: NEGATIVE
Urine Glucose: NEGATIVE
Urobilinogen, UA: 0.2 (ref 0.0–1.0)
pH: 6 (ref 5.0–8.0)

## 2013-03-26 LAB — PSA: PSA: 3.4 ng/mL (ref 0.10–4.00)

## 2013-03-26 LAB — TSH: TSH: 0.78 u[IU]/mL (ref 0.35–5.50)

## 2013-03-26 LAB — BASIC METABOLIC PANEL WITH GFR
BUN: 11 mg/dL (ref 6–23)
CO2: 28 meq/L (ref 19–32)
Calcium: 8.9 mg/dL (ref 8.4–10.5)
Chloride: 108 meq/L (ref 96–112)
Creatinine, Ser: 1.2 mg/dL (ref 0.4–1.5)
GFR: 77.49 mL/min
Glucose, Bld: 86 mg/dL (ref 70–99)
Potassium: 3.5 meq/L (ref 3.5–5.1)
Sodium: 141 meq/L (ref 135–145)

## 2013-03-26 LAB — HEPATIC FUNCTION PANEL
ALBUMIN: 4.1 g/dL (ref 3.5–5.2)
ALT: 20 U/L (ref 0–53)
AST: 22 U/L (ref 0–37)
Alkaline Phosphatase: 66 U/L (ref 39–117)
Bilirubin, Direct: 0.2 mg/dL (ref 0.0–0.3)
Total Bilirubin: 1 mg/dL (ref 0.3–1.2)
Total Protein: 7 g/dL (ref 6.0–8.3)

## 2013-03-28 LAB — HEMOGLOBINOPATHY EVALUATION
HGB A2 QUANT: 3.4 % — AB (ref 2.2–3.2)
Hemoglobin Other: 0 %
Hgb A: 58.6 % — ABNORMAL LOW (ref 96.8–97.8)
Hgb F Quant: 0 % (ref 0.0–2.0)
Hgb S Quant: 38 % — ABNORMAL HIGH

## 2013-04-02 ENCOUNTER — Ambulatory Visit (INDEPENDENT_AMBULATORY_CARE_PROVIDER_SITE_OTHER): Payer: Managed Care, Other (non HMO) | Admitting: Internal Medicine

## 2013-04-02 ENCOUNTER — Other Ambulatory Visit (INDEPENDENT_AMBULATORY_CARE_PROVIDER_SITE_OTHER): Payer: Managed Care, Other (non HMO)

## 2013-04-02 ENCOUNTER — Encounter: Payer: Self-pay | Admitting: Internal Medicine

## 2013-04-02 VITALS — BP 120/80 | HR 64 | Temp 98.0°F | Ht 69.0 in | Wt 280.0 lb

## 2013-04-02 DIAGNOSIS — Z0000 Encounter for general adult medical examination without abnormal findings: Secondary | ICD-10-CM

## 2013-04-02 DIAGNOSIS — N400 Enlarged prostate without lower urinary tract symptoms: Secondary | ICD-10-CM | POA: Insufficient documentation

## 2013-04-02 DIAGNOSIS — E785 Hyperlipidemia, unspecified: Secondary | ICD-10-CM

## 2013-04-02 DIAGNOSIS — R972 Elevated prostate specific antigen [PSA]: Secondary | ICD-10-CM

## 2013-04-02 LAB — LIPID PANEL
CHOL/HDL RATIO: 5
Cholesterol: 123 mg/dL (ref 0–200)
HDL: 22.9 mg/dL — ABNORMAL LOW (ref >39.00)
LDL Cholesterol: 83 mg/dL (ref 0–99)
TRIGLYCERIDES: 86 mg/dL (ref 0.0–149.0)
VLDL: 17.2 mg/dL (ref 0.0–40.0)

## 2013-04-02 MED ORDER — PRAVASTATIN SODIUM 40 MG PO TABS: 40.0000 mg | ORAL_TABLET | Freq: Every day | ORAL | Status: DC

## 2013-04-02 MED ORDER — NAPROXEN 500 MG PO TABS: 500.0000 mg | ORAL_TABLET | Freq: Two times a day (BID) | ORAL | Status: DC

## 2013-04-02 MED ORDER — HYDROCHLOROTHIAZIDE 25 MG PO TABS: 25.0000 mg | ORAL_TABLET | Freq: Every day | ORAL | Status: DC

## 2013-04-02 MED ORDER — MELOXICAM 15 MG PO TABS: 15.0000 mg | ORAL_TABLET | Freq: Every day | ORAL | Status: DC | PRN

## 2013-04-02 MED ORDER — IRBESARTAN 300 MG PO TABS
300.0000 mg | ORAL_TABLET | Freq: Every day | ORAL | Status: DC
Start: 2013-04-02 — End: 2014-04-22

## 2013-04-02 MED ORDER — VERAPAMIL HCL ER 200 MG PO CP24: 1.0000 | ORAL_CAPSULE | Freq: Every day | ORAL | Status: DC

## 2013-04-02 MED ORDER — TAMSULOSIN HCL 0.4 MG PO CAPS: 0.4000 mg | ORAL_CAPSULE | Freq: Every day | ORAL | Status: DC

## 2013-04-02 MED ORDER — AMLODIPINE BESYLATE 5 MG PO TABS: 5.0000 mg | ORAL_TABLET | Freq: Every day | ORAL | Status: DC

## 2013-04-02 NOTE — Progress Notes (Signed)
Subjective:    Patient ID: Calvin [name], male    DOB: 07/03/1950, 63 y.o.   MRN: [id number]  HPI Here for wellness and f/u;  Overall doing ok;  Pt denies CP, worsening SOB, DOE, wheezing, orthopnea, PND, worsening LE edema, palpitations, dizziness or syncope.  Pt denies neurological change such as new headache, facial or extremity weakness.  Pt denies polydipsia, polyuria, or low sugar symptoms. Pt states overall good compliance with treatment and medications, good tolerability, and has been trying to follow lower cholesterol diet.  Pt denies worsening depressive symptoms, suicidal ideation or panic. No fever, night sweats, wt loss, loss of appetite, or other constitutional symptoms.  Pt states good ability with ADL's, has low fall risk, home safety reviewed and adequate, no other significant changes in hearing or vision, and active with exercise - back to exercise now after the first of the yr, mostly cardio at the gym.  Also with occas night sweats for unclear reasons. No other acute complaints Past Medical History  Diagnosis Date  . Hyperlipidemia   . Hypertension   . Gout   . Diverticulosis    Past Surgical History  Procedure Laterality Date  . Rotator cuff repair      left  . Throat surgery      polyps removed from vocal cord  . Polypectomy    . Colonoscopy      reports that he quit smoking about 35 years ago. He has never used smokeless tobacco. He reports that he does not drink alcohol or use illicit drugs. family history is not on file. No Known Allergies  Review of Systems Constitutional: Negative for diaphoresis, activity change, appetite change or unexpected weight change.  HENT: Negative for hearing loss, ear pain, facial swelling, mouth sores and neck stiffness.   Eyes: Negative for pain, redness and visual disturbance.  Respiratory: Negative for shortness of breath and wheezing.   Cardiovascular: Negative for chest pain and palpitations.  Gastrointestinal: Negative for  diarrhea, blood in stool, abdominal distention or other pain Genitourinary: Negative for hematuria, flank pain or change in urine volume.  Musculoskeletal: Negative for myalgias and joint swelling.  Skin: Negative for color change and wound.  Neurological: Negative for syncope and numbness. other than noted Hematological: Negative for adenopathy.  Psychiatric/Behavioral: Negative for hallucinations, self-injury, decreased concentration and agitation.      Objective:   Physical Exam BP 120/80  Pulse 64  Temp(Src) 98 F (36.7 C) (Oral)  Ht 5\' 9"  (1.753 m)  Wt 280 lb (127.007 kg)  BMI 41.33 kg/m2  SpO2 98% VS noted,  Constitutional: Pt is oriented to person, place, and time. Appears well-developed and well-nourished.  Head: Normocephalic and atraumatic.  Right Ear: External ear normal.  Left Ear: External ear normal.  Nose: Nose normal.  Mouth/Throat: Oropharynx is clear and moist.  Eyes: Conjunctivae and EOM are normal. Pupils are equal, round, and reactive to light.  Neck: Normal range of motion. Neck supple. No JVD present. No tracheal deviation present.  Cardiovascular: Normal rate, regular rhythm, normal heart sounds and intact distal pulses.   Pulmonary/Chest: Effort normal and breath sounds normal.  Abdominal: Soft. Bowel sounds are normal. There is no tenderness. No HSM  Musculoskeletal: Normal range of motion. Exhibits no edema.  Lymphadenopathy:  Has no cervical adenopathy.  Neurological: Pt is alert and oriented to person, place, and time. Pt has normal reflexes. No cranial nerve deficit.  Skin: Skin is warm and dry. No rash noted.  Psychiatric:  Has  normal mood and affect. Behavior is normal.     Assessment & Plan:

## 2013-04-02 NOTE — Assessment & Plan Note (Signed)

## 2013-04-02 NOTE — Assessment & Plan Note (Signed)
Ok for urology referral.  

## 2013-04-02 NOTE — Assessment & Plan Note (Signed)
For some reason lipids not done with previsit labs - for lipids

## 2013-04-02 NOTE — Patient Instructions (Addendum)
Please make a Nurse Appt after June 2 for the new Prevnar pneumonia shot Please continue all other medications as before, and refills have been done if requested. Please have the pharmacy call with any other refills you may need. Please continue your efforts at being more active, low cholesterol diet, and weight control. You are otherwise up to date with prevention measures today. Please keep your appointments with your specialists as you may have planned  Your EKG was OK today Your labs were given to you today  Please go to the LAB in the Basement (turn left off the elevator) for the tests to be done today - ONLY the cholesterol today  You will be contacted by phone if any changes need to be made immediately.  Otherwise, you will receive a letter about your results with an explanation, but please check with MyChart first.  You will be contacted regarding the referral for: urology  Please remember to sign up for My Chart if you have not done so, as this will be important to you in the future with finding out test results, communicating by private email, and scheduling acute appointments online when needed.  Please return in 1 year for your yearly visit, or sooner if needed, with Lab testing done 3-5 days before

## 2013-04-02 NOTE — Progress Notes (Signed)
Pre-visit discussion using our clinic review tool. No additional management support is needed unless otherwise documented below in the visit note.  

## 2013-04-08 ENCOUNTER — Encounter: Payer: Self-pay | Admitting: Internal Medicine

## 2013-04-08 NOTE — Telephone Encounter (Signed)
The referral has been "in the system" since jan 21; I will forward the concern to the coordinators

## 2013-05-02 ENCOUNTER — Other Ambulatory Visit: Payer: Self-pay

## 2013-05-02 ENCOUNTER — Encounter: Payer: Self-pay | Admitting: Internal Medicine

## 2013-05-02 MED ORDER — ALLOPURINOL 100 MG PO TABS: 100.0000 mg | ORAL_TABLET | Freq: Every day | ORAL | Status: DC

## 2013-08-27 ENCOUNTER — Ambulatory Visit: Payer: Managed Care, Other (non HMO)

## 2013-08-27 ENCOUNTER — Encounter: Payer: Self-pay | Admitting: Internal Medicine

## 2013-08-27 ENCOUNTER — Ambulatory Visit (INDEPENDENT_AMBULATORY_CARE_PROVIDER_SITE_OTHER): Payer: Medicare Other | Admitting: Internal Medicine

## 2013-08-27 VITALS — BP 110/82 | HR 93 | Temp 98.3°F | Wt 281.0 lb

## 2013-08-27 DIAGNOSIS — R609 Edema, unspecified: Secondary | ICD-10-CM | POA: Insufficient documentation

## 2013-08-27 DIAGNOSIS — Z23 Encounter for immunization: Secondary | ICD-10-CM | POA: Diagnosis not present

## 2013-08-27 DIAGNOSIS — R600 Localized edema: Secondary | ICD-10-CM

## 2013-08-27 DIAGNOSIS — R197 Diarrhea, unspecified: Secondary | ICD-10-CM | POA: Diagnosis not present

## 2013-08-27 DIAGNOSIS — I10 Essential (primary) hypertension: Secondary | ICD-10-CM

## 2013-08-27 MED ORDER — CIPROFLOXACIN HCL 500 MG PO TABS: 500.0000 mg | ORAL_TABLET | Freq: Two times a day (BID) | ORAL | Status: DC

## 2013-08-27 MED ORDER — FUROSEMIDE 20 MG PO TABS: 20.0000 mg | ORAL_TABLET | Freq: Every day | ORAL | Status: DC

## 2013-08-27 NOTE — Patient Instructions (Addendum)
You had the new prevnar pneumonia shot today  Please take all new medication as prescribed  - the antibiotic, and the fluid pill as prescribed  Please continue all other medications as before, and refills have been done if requested.  Please have the pharmacy call with any other refills you may need.  Please continue your efforts at being more active, low cholesterol diet, and weight control.  Please return for further evaluation for any persistent or worsening swelling, abdominal pain or diarrhea

## 2013-08-27 NOTE — Progress Notes (Signed)
Subjective:    Patient ID: Calvin [name], male    DOB: Jun 16, 1950, 63 y.o.   MRN: [id number]  HPI  Here to f/u, just back from 2 wk vacation out of the country involving [place] and [employer], admits to many meals with salt such as french fries, now with new onset LE edema both legs beginning towards late vacation, not really worse with travle, overall mild, worse towards the evening, better in the am, has been on chronic amlod 5 and naproxen prn without prior swelling for years and no other med changes. Pt denies chest pain, increased sob or doe, wheezing, orthopnea, PND, increased LE swelling, palpitations, dizziness or syncope. Except for the above.  Also incidentally just after returned home with acute onset now 2-3 days mult joint aches and soreness without swelling or stiffness, but also with sudden onset  "growling" stomach mult loose watery diarrheal stools, no blood, worsening abd pai, n/v, wt loss, dizziness. Pt asks for prevnar today as he is due Past Medical History  Diagnosis Date  . Hyperlipidemia   . Hypertension   . Gout   . Diverticulosis    Past Surgical History  Procedure Laterality Date  . Rotator cuff repair      left  . Throat surgery      polyps removed from vocal cord  . Polypectomy    . Colonoscopy      reports that he quit smoking about 36 years ago. He has never used smokeless tobacco. He reports that he does not drink alcohol or use illicit drugs. family history is not on file. No Known Allergies Current Outpatient Prescriptions on File Prior to Visit  Medication Sig Dispense Refill  . allopurinol (ZYLOPRIM) 100 MG tablet Take 1 tablet (100 mg total) by mouth daily.  90 tablet  3  . amLODipine (NORVASC) 5 MG tablet Take 1 tablet (5 mg total) by mouth daily.  90 tablet  3  . aspirin 81 MG tablet Take 81 mg by mouth daily.        . Coenzyme Q10 (CO Q 10 PO) Take by mouth daily.        . colchicine (COLCRYS) 0.6 MG tablet Take 1 tablet (0.6 mg total) by  mouth daily.  90 tablet  3  . cyclobenzaprine (FLEXERIL) 10 MG tablet Take 1 tablet (10 mg total) by mouth 3 (three) times daily as needed for muscle spasms.  60 tablet  0  . fluticasone (FLONASE) 50 MCG/ACT nasal spray Place 2 sprays into the nose daily.  48 g  3  . glucosamine-chondroitin 500-400 MG tablet Take 1 tablet by mouth daily.        . hydrochlorothiazide (HYDRODIURIL) 25 MG tablet Take 1 tablet (25 mg total) by mouth daily.  90 tablet  3  . indomethacin (INDOCIN) 50 MG capsule Take 1 capsule (50 mg total) by mouth 3 (three) times daily as needed. For gout  90 capsule  3  . irbesartan (AVAPRO) 300 MG tablet Take 1 tablet (300 mg total) by mouth daily.  90 tablet  3  . meloxicam (MOBIC) 15 MG tablet Take 1 tablet (15 mg total) by mouth daily as needed for pain.  90 tablet  3  . naproxen (NAPROSYN) 500 MG tablet Take 1 tablet (500 mg total) by mouth 2 (two) times daily with a meal.  60 tablet  5  . pravastatin (PRAVACHOL) 40 MG tablet Take 1 tablet (40 mg total) by mouth daily.  90 tablet  3  . sildenafil (VIAGRA) 100 MG tablet Take 1 tablet (100 mg total) by mouth as needed for erectile dysfunction.  10 tablet  11  . tamsulosin (FLOMAX) 0.4 MG CAPS capsule Take 1 capsule (0.4 mg total) by mouth daily.  90 capsule  3  . Verapamil HCl CR 200 MG CP24 Take 1 capsule (200 mg total) by mouth daily.  90 capsule  3  . vitamin C (ASCORBIC ACID) 500 MG tablet Take 500 mg by mouth daily.         No current facility-administered medications on file prior to visit.   Review of Systems  Constitutional: Negative for unusual diaphoresis or other sweats  HENT: Negative for ringing in ear Eyes: Negative for double vision or worsening visual disturbance.  Respiratory: Negative for choking and stridor.   Gastrointestinal: Negative for vomiting or other signifcant bowel change Genitourinary: Negative for hematuria or decreased urine volume.  Musculoskeletal: Negative for other MSK pain or  swelling Skin: Negative for color change and worsening wound.  Neurological: Negative for tremors and numbness other than noted  Psychiatric/Behavioral: Negative for decreased concentration or agitation other than above       Objective:   Physical Exam BP 110/82  Pulse 93  Temp(Src) 98.3 F (36.8 C) (Oral)  Wt 281 lb (127.461 kg)  SpO2 99% VS noted, not ill appearing Constitutional: Pt appears well-developed, well-nourished.  HENT: Head: NCAT.  Right Ear: External ear normal.  Left Ear: External ear normal.  Eyes: . Pupils are equal, round, and reactive to light. Conjunctivae and EOM are normal Neck: Normal range of motion. Neck supple.  Cardiovascular: Normal rate and regular rhythm.   Pulmonary/Chest: Effort normal and breath sounds normal.  Abd:  Soft, NT, ND, + BS Neurological: Pt is alert. Not confused , motor grossly intact Skin: Skin is warm. No rash, trace bilat LE edema to knees Psychiatric: Pt behavior is normal. No agitation.     Assessment & Plan:

## 2013-08-27 NOTE — Assessment & Plan Note (Signed)
Suspect norovirus or traveler's diarreha, for cipro bid course x 1 wk empiric, consider further stool studies/c diff, labs for persistent or worsening s/s

## 2013-08-27 NOTE — Assessment & Plan Note (Signed)
I suspect dietary indiscretion with vacation has led to fluid retention/salt excess. Exam o/w benign, will hold on further labs, ecg, cxr but would consider for further symptoms or worsening edema, for lasix 20 qd for 1 wk,  to f/u any worsening symptoms or concerns

## 2013-08-27 NOTE — Assessment & Plan Note (Signed)
stable overall by history and exam, recent data reviewed with pt, and pt to continue medical treatment as before,  to f/u any worsening symptoms or concerns BP Readings from Last 3 Encounters:  08/27/13 110/82  04/02/13 120/80  01/20/13 124/78

## 2013-11-13 ENCOUNTER — Encounter: Payer: Self-pay | Admitting: Pulmonary Disease

## 2013-11-13 ENCOUNTER — Ambulatory Visit (INDEPENDENT_AMBULATORY_CARE_PROVIDER_SITE_OTHER): Payer: Medicare Other | Admitting: Pulmonary Disease

## 2013-11-13 VITALS — BP 128/62 | HR 80 | Ht 69.0 in | Wt 289.0 lb

## 2013-11-13 DIAGNOSIS — R0609 Other forms of dyspnea: Secondary | ICD-10-CM | POA: Diagnosis not present

## 2013-11-13 DIAGNOSIS — R0683 Snoring: Secondary | ICD-10-CM

## 2013-11-13 DIAGNOSIS — R0989 Other specified symptoms and signs involving the circulatory and respiratory systems: Secondary | ICD-10-CM

## 2013-11-13 DIAGNOSIS — G4733 Obstructive sleep apnea (adult) (pediatric): Secondary | ICD-10-CM

## 2013-11-13 HISTORY — DX: Obstructive sleep apnea (adult) (pediatric): G47.33

## 2013-11-13 NOTE — Progress Notes (Deleted)
   Subjective:    Patient ID: Calvin [name], male    DOB: 02-10-1951, 63 y.o.   MRN: [id number]  HPI    Review of Systems  Constitutional: Negative for fever and unexpected weight change.  HENT: Negative for congestion, dental problem, ear pain, nosebleeds, postnasal drip, rhinorrhea, sinus pressure, sneezing, sore throat and trouble swallowing.        Allergies  Eyes: Negative for redness and itching.  Respiratory: Negative for cough, chest tightness, shortness of breath and wheezing.   Cardiovascular: Negative for palpitations and leg swelling.  Gastrointestinal: Negative for nausea and vomiting.  Genitourinary: Negative for dysuria.  Musculoskeletal: Negative for joint swelling.  Skin: Negative for rash.  Neurological: Negative for headaches.  Hematological: Does not bruise/bleed easily.  Psychiatric/Behavioral: Negative for dysphoric mood. The patient is not nervous/anxious.        Objective:   Physical Exam        Assessment & Plan:

## 2013-11-13 NOTE — Patient Instructions (Signed)
Will arrange for home sleep study Will call to arrange for follow up after sleep study reviewed  

## 2013-11-13 NOTE — Progress Notes (Signed)
Chief Complaint  Patient presents with  . SLEEP CONSULT    Self referral. Epworth Score: 4    History of Present Illness: Calvin [name] is a 63 y.o. male for evaluation of sleep problems.  His wife has been concerned about his snoring, and says he stops breathing while asleep.  He has dry mouth at night.  He has to use energy drinks when he goes on long trips.  He has several of his friends who have sleep problems and use CPAP.  He was concerned he has similar trouble with his sleep.  He goes to sleep at 11 pm.  He falls asleep quickly.  He wakes up 3 times to use the bathroom.  He gets out of bed at 7 am.  He feels tire in the morning.  He denies morning headache.  He does not use anything to help him fall sleep or stay awake.  He denies sleep walking, sleep talking, bruxism, or nightmares.  There is no history of restless legs.  He denies sleep hallucinations, sleep paralysis, or cataplexy.  The Epworth score is 4 out of 24.   Calvin [name]  has a past medical history of Hyperlipidemia; Hypertension; Gout; and Diverticulosis.  Calvin [name]  has past surgical history that includes Rotator cuff repair; Throat surgery; Polypectomy; and Colonoscopy.  Prior to Admission medications   Medication Sig Start Date End Date Taking? Authorizing Provider  allopurinol (ZYLOPRIM) 100 MG tablet Take 1 tablet (100 mg total) by mouth daily. 05/02/13  Yes Biagio Borg, MD  amLODipine (NORVASC) 5 MG tablet Take 1 tablet (5 mg total) by mouth daily. 04/02/13  Yes Biagio Borg, MD  aspirin 81 MG tablet Take 81 mg by mouth daily.     Yes Historical Provider, MD  colchicine (COLCRYS) 0.6 MG tablet Take 1 tablet (0.6 mg total) by mouth daily. 03/18/12  Yes Biagio Borg, MD  fluticasone (FLONASE) 50 MCG/ACT nasal spray Place 2 sprays into the nose daily. 03/18/12  Yes Biagio Borg, MD  glucosamine-chondroitin 500-400 MG tablet Take 1 tablet by mouth daily.     Yes Historical Provider, MD  indomethacin (INDOCIN)  50 MG capsule Take 1 capsule (50 mg total) by mouth 3 (three) times daily as needed. For gout 11/13/12  Yes Biagio Borg, MD  irbesartan (AVAPRO) 300 MG tablet Take 1 tablet (300 mg total) by mouth daily. 04/02/13  Yes Biagio Borg, MD  naproxen (NAPROSYN) 500 MG tablet Take 1 tablet (500 mg total) by mouth 2 (two) times daily with a meal. 04/02/13  Yes Biagio Borg, MD  pravastatin (PRAVACHOL) 40 MG tablet Take 1 tablet (40 mg total) by mouth daily. 04/02/13  Yes Biagio Borg, MD  sildenafil (VIAGRA) 100 MG tablet Take 1 tablet (100 mg total) by mouth as needed for erectile dysfunction. 03/18/12  Yes Biagio Borg, MD  tamsulosin (FLOMAX) 0.4 MG CAPS capsule Take 1 capsule (0.4 mg total) by mouth daily. 04/02/13  Yes Biagio Borg, MD  Verapamil HCl CR 200 MG CP24 Take 1 capsule (200 mg total) by mouth daily. 04/02/13  Yes Biagio Borg, MD  vitamin C (ASCORBIC ACID) 500 MG tablet Take 500 mg by mouth daily.     Yes Historical Provider, MD    No Known Allergies  His family history is not on file.  He  reports that he quit smoking about 36 years ago. His smoking use included Cigarettes. He has a 10 pack-year smoking  history. He has never used smokeless tobacco. He reports that he does not drink alcohol or use illicit drugs.  Review of Systems  Constitutional: Negative for fever and unexpected weight change.  HENT: Negative for congestion, dental problem, ear pain, nosebleeds, postnasal drip, rhinorrhea, sinus pressure, sneezing, sore throat and trouble swallowing.        Allergies  Eyes: Negative for redness and itching.  Respiratory: Negative for cough, chest tightness, shortness of breath and wheezing.   Cardiovascular: Negative for palpitations and leg swelling.  Gastrointestinal: Negative for nausea and vomiting.  Genitourinary: Negative for dysuria.  Musculoskeletal: Negative for joint swelling.  Skin: Negative for rash.  Neurological: Negative for headaches.  Hematological: Does not  bruise/bleed easily.  Psychiatric/Behavioral: Negative for dysphoric mood. The patient is not nervous/anxious.    Physical Exam:  General - No distress ENT - No sinus tenderness, no oral exudate, no LAN, no thyromegaly, TM clear, pupils equal/reactive, MP 4 Cardiac - s1s2 regular, no murmur, pulses symmetric Chest - No wheeze/rales/dullness, good air entry, normal respiratory excursion Back - No focal tenderness Abd - Soft, non-tender, no organomegaly, + bowel sounds Ext - No edema Neuro - Normal strength, cranial nerves intact Skin - onychomycosis Psych - Normal mood, and behavior  Assessment/plan:  Calvin [name], M.D. Pager [phone]

## 2013-11-13 NOTE — Assessment & Plan Note (Addendum)
He has snoring, sleep disruption, apnea, and daytime sleepiness.  He has hx of HTN.  I am concerned he could have sleep apnea.  We discussed how sleep apnea can affect various health problems including risks for hypertension, cardiovascular disease, and diabetes.  We also discussed how sleep disruption can increase risks for accident, such as while driving.  Weight loss as a means of improving sleep apnea was also reviewed.  Additional treatment options discussed were CPAP therapy, oral appliance, and surgical intervention.  To further assess will arrange for home sleep study pending insurance approval.

## 2013-11-24 ENCOUNTER — Other Ambulatory Visit: Payer: Self-pay | Admitting: Internal Medicine

## 2013-12-01 DIAGNOSIS — Z23 Encounter for immunization: Secondary | ICD-10-CM | POA: Diagnosis not present

## 2013-12-10 ENCOUNTER — Ambulatory Visit (INDEPENDENT_AMBULATORY_CARE_PROVIDER_SITE_OTHER): Payer: Medicare Other | Admitting: Family Medicine

## 2013-12-10 ENCOUNTER — Encounter: Payer: Self-pay | Admitting: Family Medicine

## 2013-12-10 ENCOUNTER — Ambulatory Visit (INDEPENDENT_AMBULATORY_CARE_PROVIDER_SITE_OTHER)
Admission: RE | Admit: 2013-12-10 | Discharge: 2013-12-10 | Disposition: A | Discharge status: Home / Self Care | Payer: Medicare Other | Source: Ambulatory Visit | Attending: Family Medicine | Admitting: Family Medicine

## 2013-12-10 VITALS — BP 130/70 | HR 86 | Temp 98.5°F | Wt 283.0 lb

## 2013-12-10 DIAGNOSIS — S83207A Unspecified tear of unspecified meniscus, current injury, left knee, initial encounter: Secondary | ICD-10-CM | POA: Insufficient documentation

## 2013-12-10 DIAGNOSIS — S83209D Unspecified tear of unspecified meniscus, current injury, unspecified knee, subsequent encounter: Secondary | ICD-10-CM

## 2013-12-10 DIAGNOSIS — M1710 Unilateral primary osteoarthritis, unspecified knee: Secondary | ICD-10-CM

## 2013-12-10 DIAGNOSIS — Z23 Encounter for immunization: Secondary | ICD-10-CM | POA: Diagnosis not present

## 2013-12-10 DIAGNOSIS — IMO0002 Reserved for concepts with insufficient information to code with codable children: Secondary | ICD-10-CM

## 2013-12-10 MED ORDER — TRIAMCINOLONE ACETONIDE 40 MG/ML IJ SUSP
40.0000 mg | Freq: Once | INTRAMUSCULAR | Status: AC
  Administered 2013-12-10: 40 mg via INTRA_ARTICULAR

## 2013-12-10 MED ORDER — DICLOFENAC SODIUM 2 % TD SOLN: 2.0000 "application " | Freq: Two times a day (BID) | TRANSDERMAL | Status: DC

## 2013-12-10 NOTE — Progress Notes (Signed)
CC: Left knee pain  Patient is a very pleasant 63 year old male coming in with left knee pain. Patient was seen greater than one year ago and was found to have gouty arthritic changes of the left knee. Patient did respond very well to a corticosteroid injection and we did start patient on gout medications and changed hypertensive medications at that time. 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 Patient states that since that time approximately 2 weeks ago the pain is gotten worse and worse. Patient states that unfortunately the pain seems to be on the medial and lateral aspect of the knee and get some catching sensation from time to time. Denies ever giving on him. States that the severity is about 6/10. Denies any nighttime awakening. Patient though has had to decrease his activity secondary to the pain. Patient has tried naproxen with mild benefit.   Past medical, surgical, family and social history reviewed. Medications reviewed all in the electronic medical record.   Review of Systems: No headache, visual changes, nausea, vomiting, diarrhea, constipation, dizziness, abdominal pain, skin rash, fevers, chills, night sweats, weight loss, swollen lymph nodes, body aches, joint swelling, muscle aches, chest pain, shortness of breath, mood changes.   Objective:    Blood pressure 130/70, pulse 86, temperature 98.5 F (36.9 C), temperature source Oral, weight 283 lb (128.368 kg), SpO2 96.00%.   General: No apparent distress alert and oriented x3 mood and affect normal, dressed appropriately. Obese male HEENT: Pupils equal, extraocular movements intact Respiratory: Patient's speak in full sentences and does not appear short of breath Cardiovascular: No lower extremity edema, non tender, no erythema Skin: Warm dry intact with no signs of infection or rash on extremities or on axial skeleton. Abdomen: Soft  nontender Neuro: Cranial nerves II through XII are intact, neurovascularly intact in all extremities with 2+ DTRs and 2+ pulses. Lymph: No lymphadenopathy of posterior or anterior cervical chain or axillae bilaterally.  Gait normal with good balance and coordination.  MSK: Non tender with full range of motion and good stability and symmetric strength and tone of shoulders, elbows, wrist, hip,  and ankles bilaterally.  Knee: Left Normal to inspection with no erythema but does have trace effusion  Palpation normal with no warmth,  but does have joint line tenderness, but negative patellar tenderness, or condyle tenderness. Range of motion is full but does have some pain with full flexion Ligaments with solid consistent endpoints including ACL, PCL, LCL, MCL. Symmetric to contralateral side Positive Mcmurray's, Apley's, and Thessalonian tests. Non painful patellar compression. Patellar glide without crepitus. Patellar and quadriceps tendons unremarkable. Hamstring and quadriceps strength is normal.   MSK US performed of: Left knee This study was ordered, performed, and interpreted by Charlann Boxer D.O.  Knee: Left limited exam All structures visualized. Anterior medial meniscus does seem to be displacing does have severe narrowing of the medial joint line. No Swelling in the knee noted. This is also seen on the lateral meniscus.  Patellar Tendon unremarkable on long and transverse views without effusion. Patient though does have a trace effusion of the knee joint itself the No abnormality of prepatellar bursa. LCL and MCL unremarkable on long and transverse views. No abnormality of origin of medial or lateral head of the gastrocnemius.  IMPRESSION:  Meniscal tear of the medial side with moderate osteophytic changes  Procedure: Real-time Ultrasound Guided Injection of left knee Device: GE  Logiq E  Ultrasound guided injection is preferred based studies that show increased duration, increased  effect, greater accuracy, decreased procedural pain, increased response rate, and decreased cost with ultrasound guided versus blind injection.  Verbal informed consent obtained.  Time-out conducted.  Noted no overlying erythema, induration, or other signs of local infection.  Skin prepped in a sterile fashion.  Local anesthesia: Topical Ethyl chloride.  With sterile technique and under real time ultrasound guidance: With a 22-gauge 2 inch needle patient was injected with 4 cc of 0.5% Marcaine and 1 cc of Kenalog 40 mg/dL. This was from a superior lateral approach.  Completed without difficulty  Pain immediately resolved suggesting accurate placement of the medication.  Advised to call if fevers/chills, erythema, induration, drainage, or persistent bleeding.  Images permanently stored and available for review in the ultrasound unit.  Impression: Technically successful ultrasound guided injection.  Impression and Recommendations:     This case required medical decision making of moderate complexity.

## 2013-12-10 NOTE — Addendum Note (Signed)
Addended by: Monico Blitz T on: 12/10/2013 01:57 PM   Modules accepted: Orders

## 2013-12-10 NOTE — Assessment & Plan Note (Signed)
Patient was given injection today and tolerated the procedure very well. Patient's given the suggestion to wear brace a regular basis. In addition this patient will do a topical anti-inflammatory prescription was given. Patient was to do home exercises 3 times a week and then come back and see me again for further evaluation. Patient has continued pain worsening pain we may need to consider further imaging. X-rays were ordered today and pending to evaluate this significant aspect of his arthritis.  Spent greater than 25 minutes with patient face-to-face and had greater than 50% of counseling including as described above in assessment and plan.

## 2013-12-10 NOTE — Addendum Note (Signed)
Addended by: Monico Blitz T on: 12/10/2013 01:52 PM   Modules accepted: Orders

## 2013-12-10 NOTE — Patient Instructions (Signed)
Great to see you again.  Ice is your friend. Ice 20 minutes 2 times daily. Usually after activity and before bed. Good shoes can help Try the pennsaid twice daily and can stop the naproxen.  Exercises 3 times a week.  Wear brace with a lot of activity Xray downstairs today.  See me again in 3 weeks

## 2013-12-10 NOTE — Progress Notes (Signed)
Pre visit review using our clinic review tool, if applicable. No additional management support is needed unless otherwise documented below in the visit note. 

## 2013-12-24 DIAGNOSIS — N528 Other male erectile dysfunction: Secondary | ICD-10-CM | POA: Diagnosis not present

## 2013-12-24 DIAGNOSIS — R972 Elevated prostate specific antigen [PSA]: Secondary | ICD-10-CM | POA: Diagnosis not present

## 2013-12-29 DIAGNOSIS — G4730 Sleep apnea, unspecified: Secondary | ICD-10-CM | POA: Diagnosis not present

## 2013-12-30 ENCOUNTER — Ambulatory Visit: Payer: Medicare Other | Admitting: Family Medicine

## 2013-12-30 ENCOUNTER — Ambulatory Visit (INDEPENDENT_AMBULATORY_CARE_PROVIDER_SITE_OTHER): Payer: Medicare Other | Admitting: Family Medicine

## 2013-12-30 ENCOUNTER — Encounter: Payer: Self-pay | Admitting: Family Medicine

## 2013-12-30 VITALS — BP 138/72 | HR 71 | Ht 69.0 in | Wt 280.0 lb

## 2013-12-30 DIAGNOSIS — M179 Osteoarthritis of knee, unspecified: Secondary | ICD-10-CM

## 2013-12-30 DIAGNOSIS — M1710 Unilateral primary osteoarthritis, unspecified knee: Secondary | ICD-10-CM

## 2013-12-30 NOTE — Assessment & Plan Note (Signed)
Patient does have severe osteophytic changes mostly of the medial joint line. We discussed him trying some conservative therapy. Patient had so much good relief with the cortical steroid injection of the left knee that we try one on the right knee today. Patient did ambulate better immediately. Patient encouraged to do the same exercises and icing protocol as he does on the other side. Patient will come back and see me again in 3 weeks. At that time if he continues to have pain he would be a candidate for viscous supplementation. We'll discuss at followup. Discussed the importance of weight loss as well.  Spent greater than 25 minutes with patient face-to-face and had greater than 50% of counseling including as described above in assessment and plan.

## 2013-12-30 NOTE — Patient Instructions (Signed)
Great to see you Calvin [name] is your friend.  Continue the exercises 3 times a week.  Continue the medications we discussed.  Pennsaid and naproxen ok if not hurting the stomach During gout flare stop the naproxen and start indomethacin.  See me again in 3-4 weeks.

## 2013-12-30 NOTE — Progress Notes (Signed)
CC: Right knee pain  Patient was seen previously for left knee pain and found to have moderate osteophytic changes as well as an acute on chronic meniscal tear. Patient was given a corticosteroid injection and states that he is doing significantly better. Patient states that he is approximately 85-90% better of the left knee.  Unfortunately patient is now having right sided knee pain. Patient does not remember any injury but states that since his left knee has been feeling better he started having more pain on the medial aspect of the right knee. States that it is worse when he goes up or down stairs. Has a clicking sensation and can be very painful. Denies it giving out on him. Does not feel like his gout. States that the severity is approximately 6 or even 7/10. Denies any nighttime awakening.   Past medical, surgical, family and social history reviewed. Medications reviewed all in the electronic medical record.   Review of Systems: No headache, visual changes, nausea, vomiting, diarrhea, constipation, dizziness, abdominal pain, skin rash, fevers, chills, night sweats, weight loss, swollen lymph nodes, body aches, joint swelling, muscle aches, chest pain, shortness of breath, mood changes.   Objective:    Blood pressure 138/72, pulse 71, height 5\' 9"  (1.753 m), weight 280 lb (127.007 kg), SpO2 97.00%.   General: No apparent distress alert and oriented x3 mood and affect normal, dressed appropriately. Obese male HEENT: Pupils equal, extraocular movements intact Respiratory: Patient's speak in full sentences and does not appear short of breath Cardiovascular: No lower extremity edema, non tender, no erythema Skin: Warm dry intact with no signs of infection or rash on extremities or on axial skeleton. Abdomen: Soft nontender Neuro: Cranial nerves II through XII are intact, neurovascularly intact in all extremities with 2+ DTRs and 2+ pulses. Lymph: No lymphadenopathy of posterior or anterior  cervical chain or axillae bilaterally.  Gait normal with good balance and coordination.  MSK: Non tender with full range of motion and good stability and symmetric strength and tone of shoulders, elbows, wrist, hip,  and ankles bilaterally.  Knee: Right Normal to inspection with no erythema but does have trace effusion  Palpation shows medial joint line tenderness Range of motion is full but does have some pain with full flexion Ligaments with solid consistent endpoints including ACL, PCL, LCL, MCL. Symmetric to contralateral side Negative Mcmurray's, Apley's, and Thessalonian tests. Non painful patellar compression. Patellar glide without crepitus. Patellar and quadriceps tendons unremarkable. Hamstring and quadriceps strength is normal.  Contralateral knee is significantly better but still has a positive McMurray's.  MSK US performed of: Right knee This study was ordered, performed, and interpreted by Charlann Boxer D.O.  Knee: Right limited exam All structures visualized. Patient has severe osteophytic changes of the medial joint line and unable to see meniscus. No Swelling in the knee noted. This is also seen on the lateral meniscus.  Patellar Tendon unremarkable on long and transverse views without effusion. Patient though does have a trace effusion of the knee joint itself the No abnormality of prepatellar bursa. LCL and MCL unremarkable on long and transverse views. No abnormality of origin of medial or lateral head of the gastrocnemius.  IMPRESSION:  Significant osteoarthritic changes mostly of the medial joint line  Procedure: Real-time Ultrasound Guided Injection of right knee Device: GE Logiq E  Ultrasound guided injection is preferred based studies that show increased duration, increased effect, greater accuracy, decreased procedural pain, increased response rate, and decreased cost with ultrasound guided versus  blind injection.  Verbal informed consent obtained.  Time-out  conducted.  Noted no overlying erythema, induration, or other signs of local infection.  Skin prepped in a sterile fashion.  Local anesthesia: Topical Ethyl chloride.  With sterile technique and under real time ultrasound guidance: With a 22-gauge 2 inch needle patient was injected with 4 cc of 0.5% Marcaine and 1 cc of Kenalog 40 mg/dL. This was from a superior lateral approach.  Completed without difficulty  Pain immediately resolved suggesting accurate placement of the medication.  Advised to call if fevers/chills, erythema, induration, drainage, or persistent bleeding.  Images permanently stored and available for review in the ultrasound unit.  Impression: Technically successful ultrasound guided injection.  Impression and Recommendations:     This case required medical decision making of moderate complexity.

## 2013-12-31 ENCOUNTER — Other Ambulatory Visit: Payer: Self-pay | Admitting: Internal Medicine

## 2014-01-01 ENCOUNTER — Telehealth: Payer: Self-pay | Admitting: Pulmonary Disease

## 2014-01-01 ENCOUNTER — Encounter: Payer: Self-pay | Admitting: Pulmonary Disease

## 2014-01-01 DIAGNOSIS — G4730 Sleep apnea, unspecified: Secondary | ICD-10-CM

## 2014-01-01 NOTE — Telephone Encounter (Signed)
HST 12/29/13 >> AHI 62.8, SaO2 low 71%.  Will have my nurse inform pt that sleep study shows severe sleep apnea.  Options at this time are 1) arrange for auto CPAP now and ROV in 2 months after CPAP set up, or 2) ROV first.  If pt is agreeable to auto CPAP, then send order for auto CPAP with pressure range of 5 to 20 cm H2O with heated humidity and mask of choice.  Have download sent 1 month after starting CPAP and ROV 2 months after starting CPAP.

## 2014-01-06 NOTE — Telephone Encounter (Signed)
LMTCB x 1 

## 2014-01-08 ENCOUNTER — Encounter: Payer: Self-pay | Admitting: Pulmonary Disease

## 2014-01-09 DIAGNOSIS — G4730 Sleep apnea, unspecified: Secondary | ICD-10-CM | POA: Diagnosis not present

## 2014-01-12 ENCOUNTER — Ambulatory Visit (INDEPENDENT_AMBULATORY_CARE_PROVIDER_SITE_OTHER): Payer: Medicare Other | Admitting: Family Medicine

## 2014-01-12 ENCOUNTER — Encounter: Payer: Self-pay | Admitting: Family Medicine

## 2014-01-12 VITALS — BP 132/78 | HR 60 | Ht 69.0 in | Wt 278.0 lb

## 2014-01-12 DIAGNOSIS — M1710 Unilateral primary osteoarthritis, unspecified knee: Secondary | ICD-10-CM

## 2014-01-12 DIAGNOSIS — M179 Osteoarthritis of knee, unspecified: Secondary | ICD-10-CM | POA: Diagnosis not present

## 2014-01-12 MED ORDER — ALLOPURINOL 100 MG PO TABS: 200.0000 mg | ORAL_TABLET | Freq: Every day | ORAL | Status: DC

## 2014-01-12 NOTE — Progress Notes (Signed)
   CC: bilateral knee pain  Patient is coming in for follow-up of his bilateral knee pain. Patient did have an acute meniscal tear of the left knee but did improve after an injection greater than 1 month ago. Patient unfortunately is starting to have more right knee pain. Patient was seen not long ago and was given a critical steroid injection into his right knee. Patient has been doing the over-the-counter medicines as well as naproxen and has noticed significant improvement. Patient states that he is able to do significantly more activity. Patient is able to get up and down from a seated position much easier than previous. Patient is also started on back to the gym and has noticed improvement. Patient denies any new symptoms. Able to do all daily activities as well as rest comfortably at night. Patient has had some dry skin no secondary to the topical anti-inflammatory.   Past medical, surgical, family and social history reviewed. Medications reviewed all in the electronic medical record.   Review of Systems: No headache, visual changes, nausea, vomiting, diarrhea, constipation, dizziness, abdominal pain, skin rash, fevers, chills, night sweats, weight loss, swollen lymph nodes, body aches, joint swelling, muscle aches, chest pain, shortness of breath, mood changes.   Objective:    Blood pressure 132/78, pulse 60, height 5\' 9"  (1.753 m), weight 278 lb (126.1 kg), SpO2 97 %.   General: No apparent distress alert and oriented x3 mood and affect normal, dressed appropriately. Obese male HEENT: Pupils equal, extraocular movements intact Respiratory: Patient's speak in full sentences and does not appear short of breath Cardiovascular: No lower extremity edema, non tender, no erythema Skin: Warm dry intact with no signs of infection or rash on extremities or on axial skeleton. Abdomen: Soft nontender Neuro: Cranial nerves II through XII are intact, neurovascularly intact in all extremities with 2+  DTRs and 2+ pulses. Lymph: No lymphadenopathy of posterior or anterior cervical chain or axillae bilaterally.  Gait normal with good balance and coordination.  MSK: Non tender with full range of motion and good stability and symmetric strength and tone of shoulders, elbows, wrist, hip,  and ankles bilaterally.  Knee: bilateral Inspection shows the patient does have severe dry skin over the knees where the medicine was placed. No breakdown in skin no signs of infection. Palpation shows medial joint line tenderness Range of motion is full but does have some pain with full flexion Ligaments with solid consistent endpoints including ACL, PCL, LCL, MCL. Symmetric to contralateral side Negative Mcmurray's, Apley's, and Thessalonian tests. Non painful patellar compression. Patellar glide without crepitus. Patellar and quadriceps tendons unremarkable. Hamstring and quadriceps strength is normal.  Contralateral knee is significantly better and now  negativeMcMurray's.  .  Impression and Recommendations:     This case required medical decision making of moderate complexity.

## 2014-01-12 NOTE — Assessment & Plan Note (Signed)
Patient is doing significantly better after bilateral corticosteroid injections over the course of the last 5 weeks. Patient does not need any more aggressive therapy to seems at this time. Patient is having allergic reaction to the topical anti-inflammatory and we'll change of medication to see if he makes any improvement. Patient will continue with the naproxen. I did increase patient's allopurinol to 200 mg daily if this is any association with the C PPD that was seen on x-ray. Discussed the importance of losing weight and staying active. Patient and will follow up and see me again in 8 weeks for further evaluation.  Spent greater than 25 minutes with patient face-to-face and had greater than 50% of counseling including as described above in assessment and plan.

## 2014-01-12 NOTE — Patient Instructions (Signed)
Good to see you You are doing great! Ice after exercises but ok to do hot baths as well.  Try the voltaren gel and see if it drys out your skin as well Stop the pennsaid Continue the exercises and trying to lose weight See me when you need me.  We can repeat injections in 3 months.

## 2014-01-12 NOTE — Telephone Encounter (Signed)
Pt stopped by office. Informed pt of the results and recs per VS. Pt stated he would move forward with the CPAP set up. Order placed for CPAP. Appt made for 1/13 with VS. Pt aware someone will be contacting him to get CPAP set up. Pt verbalized understanding and denied any further questions or concerns at this time.

## 2014-03-01 ENCOUNTER — Other Ambulatory Visit: Payer: Self-pay | Admitting: Internal Medicine

## 2014-03-18 ENCOUNTER — Telehealth: Payer: Self-pay | Admitting: Pulmonary Disease

## 2014-03-18 NOTE — Telephone Encounter (Signed)
Per Ashtyn - we can double book on 03/30/14 at 2pm.  Pt is aware and appointment has been changed.

## 2014-03-21 ENCOUNTER — Encounter: Payer: Self-pay | Admitting: Family Medicine

## 2014-03-23 MED ORDER — DICLOFENAC SODIUM 2 % TD SOLN: 2.0000 "application " | Freq: Two times a day (BID) | TRANSDERMAL | Status: DC

## 2014-03-25 ENCOUNTER — Ambulatory Visit: Payer: Medicare Other | Admitting: Pulmonary Disease

## 2014-03-27 MED ORDER — DICLOFENAC SODIUM 1 % TD GEL: 2.0000 g | Freq: Two times a day (BID) | TRANSDERMAL | Status: DC

## 2014-03-27 NOTE — Addendum Note (Signed)
Addended by: Douglass Rivers T on: 03/27/2014 03:34 PM   Modules accepted: Orders

## 2014-03-30 ENCOUNTER — Encounter: Payer: Self-pay | Admitting: Pulmonary Disease

## 2014-03-30 ENCOUNTER — Ambulatory Visit (INDEPENDENT_AMBULATORY_CARE_PROVIDER_SITE_OTHER): Payer: Medicare Other | Admitting: Pulmonary Disease

## 2014-03-30 ENCOUNTER — Telehealth: Payer: Self-pay | Admitting: Pulmonary Disease

## 2014-03-30 VITALS — BP 134/78 | HR 62 | Temp 97.6°F | Ht 69.0 in | Wt 281.0 lb

## 2014-03-30 DIAGNOSIS — G4733 Obstructive sleep apnea (adult) (pediatric): Secondary | ICD-10-CM

## 2014-03-30 NOTE — Progress Notes (Signed)
Chief Complaint  Patient presents with  . Follow-up    Pt here after sleep study. Pt states he is wearing CPAP nightly for 7 hours. Pt states he has dry mouth in the monring and saliva is gathering in the mask. Pt denies issues with machine. Pt stated he is sleeping much better, occassional llight snoring but much improved.    History of Present Illness: Calvin [name] is a 64 y.o. male with OSA.  He has been doing well with CPAP.  He has full face mask.  He is sleeping better, and feels rested.  He gets occasional water build up in his mask.  He is no longer snoring.   TESTS: HST 12/29/13 >> AHI 62.8, SaO2 low 71%. Auto CPAP 02/28/14 to 03/29/14 >> used on 27 of 30 nights with average 6 hrs and 55 min.  Average AHI is 9 with median CPAP 10 cm H2O and 95 th percentile CPAP 14 cm H20.   Past medical hx >> HTN, HLD, Gout  Past surgical hx, Medications, Allergies, Family hx, Social hx all reviewed.   Physical Exam:  General - No distress ENT - No sinus tenderness, no oral exudate, no LAN, MP 4 Cardiac - s1s2 regular, no murmur Chest - No wheeze/rales/dullness Back - No focal tenderness Abd - Soft, non-tender Ext - No edema Neuro - Normal strength Skin - No rashes Psych - normal mood, and behavior   Assessment/Plan:  Obstructive sleep apnea. He is compliant with CPAP and reports benefit from therapy. Plan: - continue auto CPAP with range of 5 to 20 cm H2O  Obesity. Plan: - discussed importance of weight loss   [name], MD [facility] Pager:  [phone]

## 2014-03-30 NOTE — Telephone Encounter (Signed)
Auto CPAP 02/28/14 to 03/29/14 >> used on 27 of 30 nights with average 6 hrs and 55 min.  Average AHI is 9 with median CPAP 10 cm H2O and 95 th percentile CPAP 14 cm H20.   Will have my nurse inform pt that CPAP report looks good.  No change to current set up.

## 2014-03-30 NOTE — Patient Instructions (Signed)
Follow up in 1 year.

## 2014-04-02 NOTE — Telephone Encounter (Signed)
Results have been explained to patient, pt expressed understanding. Requests that results be mailed to him. These have been placed in mail. Nothing further needed.

## 2014-04-03 DIAGNOSIS — G4733 Obstructive sleep apnea (adult) (pediatric): Secondary | ICD-10-CM | POA: Diagnosis not present

## 2014-04-11 DIAGNOSIS — G4733 Obstructive sleep apnea (adult) (pediatric): Secondary | ICD-10-CM | POA: Diagnosis not present

## 2014-04-16 ENCOUNTER — Other Ambulatory Visit (INDEPENDENT_AMBULATORY_CARE_PROVIDER_SITE_OTHER): Payer: Medicare Other

## 2014-04-16 DIAGNOSIS — Z125 Encounter for screening for malignant neoplasm of prostate: Secondary | ICD-10-CM

## 2014-04-16 DIAGNOSIS — I10 Essential (primary) hypertension: Secondary | ICD-10-CM

## 2014-04-16 DIAGNOSIS — E785 Hyperlipidemia, unspecified: Secondary | ICD-10-CM | POA: Diagnosis not present

## 2014-04-16 DIAGNOSIS — Z0000 Encounter for general adult medical examination without abnormal findings: Secondary | ICD-10-CM | POA: Diagnosis not present

## 2014-04-16 LAB — LIPID PANEL
CHOL/HDL RATIO: 6
CHOLESTEROL: 132 mg/dL (ref 0–200)
HDL: 22.7 mg/dL — AB (ref >39.00)
LDL Cholesterol: 93 mg/dL (ref 0–99)
NonHDL: 109.3
Triglycerides: 83 mg/dL (ref 0.0–149.0)
VLDL: 16.6 mg/dL (ref 0.0–40.0)

## 2014-04-16 LAB — URINALYSIS, ROUTINE W REFLEX MICROSCOPIC
BILIRUBIN URINE: NEGATIVE
HGB URINE DIPSTICK: NEGATIVE
Ketones, ur: NEGATIVE
Leukocytes, UA: NEGATIVE
NITRITE: NEGATIVE
RBC / HPF: NONE SEEN (ref 0–?)
Specific Gravity, Urine: 1.02 (ref 1.000–1.030)
Total Protein, Urine: NEGATIVE
Urine Glucose: NEGATIVE
Urobilinogen, UA: 0.2 (ref 0.0–1.0)
pH: 6 (ref 5.0–8.0)

## 2014-04-16 LAB — CBC WITH DIFFERENTIAL/PLATELET
BASOS ABS: 0 10*3/uL (ref 0.0–0.1)
BASOS PCT: 0.5 % (ref 0.0–3.0)
EOS PCT: 1.7 % (ref 0.0–5.0)
Eosinophils Absolute: 0.1 10*3/uL (ref 0.0–0.7)
HEMATOCRIT: 38.3 % — AB (ref 39.0–52.0)
Hemoglobin: 13.1 g/dL (ref 13.0–17.0)
LYMPHS ABS: 1.3 10*3/uL (ref 0.7–4.0)
Lymphocytes Relative: 37.4 % (ref 12.0–46.0)
MCHC: 34.3 g/dL (ref 30.0–36.0)
MCV: 86.9 fl (ref 78.0–100.0)
MONO ABS: 0.3 10*3/uL (ref 0.1–1.0)
Monocytes Relative: 8.9 % (ref 3.0–12.0)
Neutro Abs: 1.9 10*3/uL (ref 1.4–7.7)
Neutrophils Relative %: 51.5 % (ref 43.0–77.0)
PLATELETS: 179 10*3/uL (ref 150.0–400.0)
RBC: 4.4 Mil/uL (ref 4.22–5.81)
RDW: 13.5 % (ref 11.5–15.5)
WBC: 3.6 10*3/uL — ABNORMAL LOW (ref 4.0–10.5)

## 2014-04-16 LAB — HEPATIC FUNCTION PANEL
ALK PHOS: 79 U/L (ref 39–117)
ALT: 15 U/L (ref 0–53)
AST: 18 U/L (ref 0–37)
Albumin: 4.2 g/dL (ref 3.5–5.2)
Bilirubin, Direct: 0.2 mg/dL (ref 0.0–0.3)
Total Bilirubin: 0.6 mg/dL (ref 0.2–1.2)
Total Protein: 6.8 g/dL (ref 6.0–8.3)

## 2014-04-16 LAB — BASIC METABOLIC PANEL
BUN: 13 mg/dL (ref 6–23)
CALCIUM: 8.9 mg/dL (ref 8.4–10.5)
CHLORIDE: 109 meq/L (ref 96–112)
CO2: 27 mEq/L (ref 19–32)
Creatinine, Ser: 1.2 mg/dL (ref 0.40–1.50)
GFR: 77.98 mL/min (ref >60.00)
GLUCOSE: 84 mg/dL (ref 70–99)
POTASSIUM: 3.8 meq/L (ref 3.5–5.1)
Sodium: 142 mEq/L (ref 135–145)

## 2014-04-16 LAB — PSA: PSA: 2.32 ng/mL (ref 0.10–4.00)

## 2014-04-16 LAB — TSH: TSH: 0.73 u[IU]/mL (ref 0.35–4.50)

## 2014-04-22 ENCOUNTER — Encounter: Payer: Self-pay | Admitting: Internal Medicine

## 2014-04-22 ENCOUNTER — Ambulatory Visit (INDEPENDENT_AMBULATORY_CARE_PROVIDER_SITE_OTHER): Payer: Medicare Other | Admitting: Family Medicine

## 2014-04-22 ENCOUNTER — Encounter: Payer: Self-pay | Admitting: Family Medicine

## 2014-04-22 ENCOUNTER — Ambulatory Visit (INDEPENDENT_AMBULATORY_CARE_PROVIDER_SITE_OTHER): Payer: Medicare Other | Admitting: Internal Medicine

## 2014-04-22 VITALS — BP 138/82 | HR 72 | Wt 275.0 lb

## 2014-04-22 VITALS — BP 140/92 | HR 60 | Temp 97.6°F | Ht 69.0 in | Wt 275.0 lb

## 2014-04-22 DIAGNOSIS — I10 Essential (primary) hypertension: Secondary | ICD-10-CM | POA: Diagnosis not present

## 2014-04-22 DIAGNOSIS — M179 Osteoarthritis of knee, unspecified: Secondary | ICD-10-CM

## 2014-04-22 DIAGNOSIS — M1710 Unilateral primary osteoarthritis, unspecified knee: Secondary | ICD-10-CM

## 2014-04-22 DIAGNOSIS — Z0189 Encounter for other specified special examinations: Secondary | ICD-10-CM

## 2014-04-22 DIAGNOSIS — Z0000 Encounter for general adult medical examination without abnormal findings: Secondary | ICD-10-CM | POA: Diagnosis not present

## 2014-04-22 MED ORDER — IRBESARTAN 300 MG PO TABS: 300.0000 mg | ORAL_TABLET | Freq: Every day | ORAL | Status: DC

## 2014-04-22 MED ORDER — TAMSULOSIN HCL 0.4 MG PO CAPS: 0.4000 mg | ORAL_CAPSULE | Freq: Every day | ORAL | Status: DC

## 2014-04-22 MED ORDER — FINASTERIDE 5 MG PO TABS: 5.0000 mg | ORAL_TABLET | Freq: Every day | ORAL | Status: DC

## 2014-04-22 MED ORDER — ALLOPURINOL 100 MG PO TABS: 200.0000 mg | ORAL_TABLET | Freq: Every day | ORAL | Status: DC

## 2014-04-22 MED ORDER — PRAVASTATIN SODIUM 40 MG PO TABS: 40.0000 mg | ORAL_TABLET | Freq: Every day | ORAL | Status: DC

## 2014-04-22 MED ORDER — NAPROXEN 500 MG PO TABS: 500.0000 mg | ORAL_TABLET | Freq: Two times a day (BID) | ORAL | Status: DC

## 2014-04-22 MED ORDER — AMLODIPINE BESYLATE 5 MG PO TABS: 5.0000 mg | ORAL_TABLET | Freq: Every day | ORAL | Status: DC

## 2014-04-22 MED ORDER — VERAPAMIL HCL ER 200 MG PO CP24: 1.0000 | ORAL_CAPSULE | Freq: Every day | ORAL | Status: DC

## 2014-04-22 NOTE — Assessment & Plan Note (Signed)
Patient is doing significantly better even 4 months after last injection. Encourage patient to continue to be active and continue to work on weight loss. Patient has braces if needed. Patient will continue with the topical medicine he has over-the-counter. Encourage patient to continue the vitamin supplementation. Patient follow-up with me again on an as-needed basis.

## 2014-04-22 NOTE — Assessment & Plan Note (Signed)

## 2014-04-22 NOTE — Progress Notes (Signed)
Pre visit review using our clinic review tool, if applicable. No additional management support is needed unless otherwise documented below in the visit note. 

## 2014-04-22 NOTE — Patient Instructions (Signed)
Please continue all other medications as before, and refills have been done if requested to [facility]  Please have the pharmacy call with any other refills you may need.  Please continue your efforts at being more active, low cholesterol diet, and weight control.  You are otherwise up to date with prevention measures today.  Please keep your appointments with your specialists as you may have planned  Please return in 1 year for your yearly visit, or sooner if needed, with Lab testing done 3-5 days before

## 2014-04-22 NOTE — Progress Notes (Signed)
Subjective:    Patient ID: Calvin [name], male    DOB: 08-17-1950, 64 y.o.   MRN: [id number]  HPI  Here for wellness and f/u;  Overall doing ok;  Pt denies CP, worsening SOB, DOE, wheezing, orthopnea, PND, worsening LE edema, palpitations, dizziness or syncope.  Pt denies neurological change such as new headache, facial or extremity weakness.  Pt denies polydipsia, polyuria, or low sugar symptoms. Pt states overall good compliance with treatment and medications, good tolerability, and has been trying to follow lower cholesterol diet.  Pt denies worsening depressive symptoms, suicidal ideation or panic. No fever, night sweats, wt loss, loss of appetite, or other constitutional symptoms.  Pt states good ability with ADL's, has low fall risk, home safety reviewed and adequate, no other significant changes in hearing or vision, and only occasionally active with exercise.  Sees Dr Halford Chessman Fatima Sanger wit tx for CPAP good compliance.   Retired at [employer].   Past Medical History  Diagnosis Date  . Hyperlipidemia   . Hypertension   . Gout   . Diverticulosis   . OSA (obstructive sleep apnea) 11/13/2013   Past Surgical History  Procedure Laterality Date  . Rotator cuff repair      left  . Throat surgery      polyps removed from vocal cord  . Polypectomy    . Colonoscopy      reports that he quit smoking about 36 years ago. His smoking use included Cigarettes. He has a 10 pack-year smoking history. He has never used smokeless tobacco. He reports that he does not drink alcohol or use illicit drugs. family history is not on file. No Known Allergies Current Outpatient Prescriptions on File Prior to Visit  Medication Sig Dispense Refill  . allopurinol (ZYLOPRIM) 100 MG tablet Take 2 tablets (200 mg total) by mouth daily. 180 tablet 3  . amLODipine (NORVASC) 5 MG tablet Take 1 tablet (5 mg total) by mouth daily. 90 tablet 3  . aspirin 81 MG tablet Take 81 mg by mouth daily.      Marland Kitchen COLCRYS 0.6 MG tablet take 1  tablet by mouth daily (Patient taking differently: take 1 tablet by mouth daily prn) 90 tablet 3  . diclofenac sodium (VOLTAREN) 1 % GEL Apply 2 g topically 2 (two) times daily. 100 g 3  . fluticasone (FLONASE) 50 MCG/ACT nasal spray Place 2 sprays into the nose daily. (Patient taking differently: Place 2 sprays into the nose daily as needed. ) 48 g 3  . glucosamine-chondroitin 500-400 MG tablet Take 1 tablet by mouth daily.      . indomethacin (INDOCIN) 50 MG capsule take 1 capsule by mouth three times a day if needed 90 capsule 1  . irbesartan (AVAPRO) 300 MG tablet Take 1 tablet (300 mg total) by mouth daily. 90 tablet 3  . pravastatin (PRAVACHOL) 40 MG tablet Take 1 tablet (40 mg total) by mouth daily. 90 tablet 3  . sildenafil (VIAGRA) 100 MG tablet Take 1 tablet (100 mg total) by mouth as needed for erectile dysfunction. 10 tablet 11  . tamsulosin (FLOMAX) 0.4 MG CAPS capsule Take 1 capsule (0.4 mg total) by mouth daily. 90 capsule 3  . Verapamil HCl CR 200 MG CP24 Take 1 capsule (200 mg total) by mouth daily. 90 capsule 3  . vitamin C (ASCORBIC ACID) 500 MG tablet Take 500 mg by mouth daily.       No current facility-administered medications on file prior to visit.  Review of Systems Constitutional: Negative for increased diaphoresis, other activity, appetite or other siginficant weight change  HENT: Negative for worsening hearing loss, ear pain, facial swelling, mouth sores and neck stiffness.   Eyes: Negative for other worsening pain, redness or visual disturbance.  Respiratory: Negative for shortness of breath and wheezing.   Cardiovascular: Negative for chest pain and palpitations.  Gastrointestinal: Negative for diarrhea, blood in stool, abdominal distention or other pain Genitourinary: Negative for hematuria, flank pain or change in urine volume.  Musculoskeletal: Negative for myalgias or other joint complaints.  Skin: Negative for color change and wound.  Neurological:  Negative for syncope and numbness. other than noted Hematological: Negative for adenopathy. or other swelling Psychiatric/Behavioral: Negative for hallucinations, self-injury, decreased concentration or other worsening agitation.      Objective:   Physical Exam BP 140/92 mmHg  Pulse 60  Temp(Src) 97.6 F (36.4 C) (Oral)  Ht 5\' 9"  (1.753 m)  Wt 275 lb (124.739 kg)  BMI 40.59 kg/m2 VS noted,  Constitutional: Pt is oriented to person, place, and time. Appears well-developed and well-nourished. Calvin [name] Head: Normocephalic and atraumatic.  Right Ear: External ear normal.  Left Ear: External ear normal.  Nose: Nose normal.  Mouth/Throat: Oropharynx is clear and moist.  Eyes: Conjunctivae and EOM are normal. Pupils are equal, round, and reactive to light.  Neck: Normal range of motion. Neck supple. No JVD present. No tracheal deviation present.  Cardiovascular: Normal rate, regular rhythm, normal heart sounds and intact distal pulses.   Pulmonary/Chest: Effort normal and breath sounds without rales or wheezing  Abdominal: Soft. Bowel sounds are normal. NT. No HSM  Musculoskeletal: Normal range of motion. Exhibits no edema.  Lymphadenopathy:  Has no cervical adenopathy.  Neurological: Pt is alert and oriented to person, place, and time. Pt has normal reflexes. No cranial nerve deficit. Motor grossly intact Skin: Skin is warm and dry. No rash noted.  Psychiatric:  Has normal mood and affect. Behavior is normal.     Assessment & Plan:

## 2014-04-22 NOTE — Progress Notes (Signed)
   CC: bilateral knee pain  Patient is coming in for follow-up of his bilateral knee pain. Patient did have an acute meniscal tear of the left knee but did improve after an injection greater than 3 months ago. Patient states overall he continues to do well. Patient has started to increase his activity recently. Patient was doing topical anti-inflammatory gel but unfortunately due to cost he is no longer doing this. Patient has gotten some over-the-counter topical which has been helpful. Patient continues to do the exercises fairly regularly. These knees or not stopping him from any activities at this time. Overall patient is fairly happy with the results.   Past medical, surgical, family and social history reviewed. Medications reviewed all in the electronic medical record.   Review of Systems: No headache, visual changes, nausea, vomiting, diarrhea, constipation, dizziness, abdominal pain, skin rash, fevers, chills, night sweats, weight loss, swollen lymph nodes, body aches, joint swelling, muscle aches, chest pain, shortness of breath, mood changes.   Objective:    Blood pressure 138/82, pulse 72, weight 275 lb (124.739 kg), SpO2 97 %.   General: No apparent distress alert and oriented x3 mood and affect normal, dressed appropriately. Obese male HEENT: Pupils equal, extraocular movements intact Respiratory: Patient's speak in full sentences and does not appear short of breath Cardiovascular: No lower extremity edema, non tender, no erythema Skin: Warm dry intact with no signs of infection or rash on extremities or on axial skeleton. Abdomen: Soft nontender Neuro: Cranial nerves II through XII are intact, neurovascularly intact in all extremities with 2+ DTRs and 2+ pulses. Lymph: No lymphadenopathy of posterior or anterior cervical chain or axillae bilaterally.  Gait normal with good balance and coordination.  MSK: Non tender with full range of motion and good stability and symmetric  strength and tone of shoulders, elbows, wrist, hip,  and ankles bilaterally.  Knee: bilateral Inspection shows no significant discoloration of skin which is an improvement from previous exam. Palpation shows medial joint line tenderness but improved from previous exam Range of motion is full but does have some pain with full flexion Ligaments with solid consistent endpoints including ACL, PCL, LCL, MCL. Symmetric to contralateral side Negative Mcmurray's, Apley's, and Thessalonian tests. Non painful patellar compression. Patellar glide without crepitus. Patellar and quadriceps tendons unremarkable. Hamstring and quadriceps strength is normal.  Contralateral knee is significantly better and now  negativeMcMurray's. Moderate improvement from previous exam. .  Impression and Recommendations:     This case required medical decision making of moderate complexity.

## 2014-04-22 NOTE — Patient Instructions (Addendum)
Good to see you Ice is your friend Continue the exercises and being active Try the capsicin 2-3 times a week.   See me when you need me.

## 2014-04-23 ENCOUNTER — Telehealth: Payer: Self-pay | Admitting: Pulmonary Disease

## 2014-04-23 NOTE — Telephone Encounter (Signed)
Spoke with pt. He reports [facility] advised him his appt for 03/30/14 w/ VS was too soon. He needed his follow up on CPAP 04/2014 or after. He reports [facility] is telling him insurance will not cover CPAP machine based off of appt in January. He was told he needs to scheduled another appt with VS. Please advise PCC's thanks

## 2014-04-24 ENCOUNTER — Encounter: Payer: Self-pay | Admitting: Internal Medicine

## 2014-04-24 MED ORDER — VERAPAMIL HCL ER 180 MG PO CP24: 180.0000 mg | ORAL_CAPSULE | Freq: Every day | ORAL | Status: DC

## 2014-04-27 NOTE — Telephone Encounter (Signed)
Called and spoke with patient and he was set up on his cpap with [facility] on 03/03/14. Pt has to be seen for f/u visit on CPAP within 31-90 days. Pt came in on 03/30/14, however, that was too soon.  Pt will need an appointment before before May 21, 2014 to be compliant with his insurance. There are no slots available for appointment. Need triage to schedule patient an appointment to see Dr. Halford Chessman before his 90 days run out.  Please contact patient to schedule this appointment. Pt is aware and is fine coming back in to see Dr. Halford Chessman. Rhonda J Cobb

## 2014-04-27 NOTE — Telephone Encounter (Signed)
Calvin [name], please advise where pt can be worked in.  Thank you.

## 2014-04-28 ENCOUNTER — Other Ambulatory Visit: Payer: Self-pay | Admitting: Internal Medicine

## 2014-04-28 NOTE — Telephone Encounter (Signed)
Pt scheduled for OV with VS 05/06/14 at 4:15 Will send to [facility] as FYI.  Nothing further needed.

## 2014-05-04 DIAGNOSIS — G4733 Obstructive sleep apnea (adult) (pediatric): Secondary | ICD-10-CM | POA: Diagnosis not present

## 2014-05-06 ENCOUNTER — Ambulatory Visit (INDEPENDENT_AMBULATORY_CARE_PROVIDER_SITE_OTHER): Payer: Medicare Other | Admitting: Pulmonary Disease

## 2014-05-06 ENCOUNTER — Ambulatory Visit: Payer: Medicare Other | Admitting: Pulmonary Disease

## 2014-05-06 ENCOUNTER — Encounter: Payer: Self-pay | Admitting: Pulmonary Disease

## 2014-05-06 VITALS — BP 120/84 | HR 68 | Temp 97.1°F | Ht 68.0 in | Wt 273.8 lb

## 2014-05-06 DIAGNOSIS — G4733 Obstructive sleep apnea (adult) (pediatric): Secondary | ICD-10-CM | POA: Diagnosis not present

## 2014-05-06 NOTE — Progress Notes (Signed)
Chief Complaint  Patient presents with  . Follow-up    CPAP compliance- 2 month. Wears CPAP nightly. Denies problems with mask or pressure.     History of Present Illness: Calvin [name] is a 64 y.o. male with OSA.  He was seen last month.  He was required to have repeat visit to meet compliance requirements by medicare.  He is doing well with CPAP.  He is sleeping about 7 hours per night.  He has no issues with mask fit.  He is not using bathroom as much at night, and no longer needs to take naps.  He is working on his weight by changing his diet.  TESTS: HST 12/29/13 >> AHI 62.8, SaO2 low 71%. Auto CPAP 04/05/14 to 05/04/14 >> used on 30 of 30 nights with average 7 hrs and 23 min.  Average AHI is 3.8 with median CPAP 9 cm H2O and 95 th percentile CPAP 13 cm H20.  Past medical hx >> HTN, HLD, Gout  Past surgical hx, Medications, Allergies, Family hx, Social hx all reviewed.   Physical Exam: Blood pressure 120/84, pulse 68, temperature 97.1 F (36.2 C), temperature source Oral, height 5\' 8"  (1.727 m), weight 273 lb 12.8 oz (124.195 kg), SpO2 97 %. Body mass index is 41.64 kg/(m^2).  General - No distress ENT - No sinus tenderness, no oral exudate, no LAN, MP 4 Cardiac - s1s2 regular, no murmur Chest - No wheeze/rales/dullness Back - No focal tenderness Abd - Soft, non-tender Ext - No edema Neuro - Normal strength Skin - No rashes Psych - normal mood, and behavior   Assessment/Plan:  Obstructive sleep apnea. He is compliant with CPAP and reports benefit from therapy. Plan: - continue auto CPAP with range of 5 to 20 cm H2O  Obesity. Plan: - discussed importance of weight loss and techniques to assist with weight loss   [name], MD [facility] Pager:  [phone]

## 2014-05-06 NOTE — Patient Instructions (Signed)
Follow up in 1 year.

## 2014-05-07 DIAGNOSIS — G4733 Obstructive sleep apnea (adult) (pediatric): Secondary | ICD-10-CM | POA: Diagnosis not present

## 2014-05-08 ENCOUNTER — Other Ambulatory Visit: Payer: Self-pay | Admitting: Internal Medicine

## 2014-05-27 ENCOUNTER — Encounter: Payer: Self-pay | Admitting: Internal Medicine

## 2014-05-27 ENCOUNTER — Ambulatory Visit (INDEPENDENT_AMBULATORY_CARE_PROVIDER_SITE_OTHER): Payer: Medicare Other | Admitting: Internal Medicine

## 2014-05-27 VITALS — BP 142/80 | HR 68 | Temp 97.8°F | Resp 20 | Ht 68.0 in | Wt 278.6 lb

## 2014-05-27 DIAGNOSIS — I10 Essential (primary) hypertension: Secondary | ICD-10-CM

## 2014-05-27 MED ORDER — VERAPAMIL HCL ER 180 MG PO TBCR: 180.0000 mg | EXTENDED_RELEASE_TABLET | Freq: Every day | ORAL | Status: DC

## 2014-05-27 NOTE — Patient Instructions (Signed)
OK to stop the verapamil ER 200 capsule  Please take all new medication as prescribed - the verapamil ER 180 tablet  Please continue all other medications as before, and refills have been done if requested.  Please have the pharmacy call with any other refills you may need.  Please keep your appointments with your specialists as you may have planned

## 2014-05-27 NOTE — Progress Notes (Signed)
Subjective:    Patient ID: Calvin [name], male    DOB: 10-07-1950, 64 y.o.   MRN: [id number]  HPI  Here to f/u; overall doing ok,  Pt denies chest pain, increasing sob or doe, wheezing, orthopnea, PND, increased LE swelling, palpitations, dizziness or syncope.  Pt denies new neurological symptoms such as new headache, or facial or extremity weakness or numbness.  Pt denies polydipsia, polyuria, or low sugar episode.   Pt denies new neurological symptoms such as new headache, or facial or extremity weakness or numbness.   Pt states overall good compliance with meds.  Needs verapamil cap 240 qd changed to less expensive tablet Past Medical History  Diagnosis Date  . Hyperlipidemia   . Hypertension   . Gout   . Diverticulosis   . OSA (obstructive sleep apnea) 11/13/2013   Past Surgical History  Procedure Laterality Date  . Rotator cuff repair      left  . Throat surgery      polyps removed from vocal cord  . Polypectomy    . Colonoscopy      reports that he quit smoking about 37 years ago. His smoking use included Cigarettes. He has a 10 pack-year smoking history. He has never used smokeless tobacco. He reports that he does not drink alcohol or use illicit drugs. family history is not on file. No Known Allergies Current Outpatient Prescriptions on File Prior to Visit  Medication Sig Dispense Refill  . allopurinol (ZYLOPRIM) 100 MG tablet Take 2 tablets (200 mg total) by mouth daily. 180 tablet 3  . amLODipine (NORVASC) 5 MG tablet take 1 tablet by mouth once daily 90 tablet 3  . aspirin 81 MG tablet Take 81 mg by mouth daily.      Marland Kitchen COLCRYS 0.6 MG tablet take 1 tablet by mouth daily (Patient taking differently: take 1 tablet by mouth daily prn) 90 tablet 3  . diclofenac sodium (VOLTAREN) 1 % GEL Apply 2 g topically 2 (two) times daily. 100 g 3  . finasteride (PROSCAR) 5 MG tablet Take 1 tablet (5 mg total) by mouth daily. 90 tablet 3  . fluticasone (FLONASE) 50 MCG/ACT nasal spray Place  2 sprays into the nose daily. (Patient taking differently: Place 2 sprays into the nose daily as needed. ) 48 g 3  . glucosamine-chondroitin 500-400 MG tablet Take 1 tablet by mouth daily.      . indomethacin (INDOCIN) 50 MG capsule take 1 capsule by mouth three times a day if needed 90 capsule 1  . irbesartan (AVAPRO) 300 MG tablet take 1 tablet by mouth once daily 90 tablet 3  . naproxen (NAPROSYN) 500 MG tablet Take 1 tablet (500 mg total) by mouth 2 (two) times daily with a meal. 180 tablet 3  . pravastatin (PRAVACHOL) 40 MG tablet take 1 tablet by mouth once daily 30 tablet 3  . sildenafil (VIAGRA) 100 MG tablet Take 1 tablet (100 mg total) by mouth as needed for erectile dysfunction. 10 tablet 11  . tamsulosin (FLOMAX) 0.4 MG CAPS capsule take 1 capsule by mouth once daily 90 capsule 3  . vitamin C (ASCORBIC ACID) 500 MG tablet Take 500 mg by mouth daily.       No current facility-administered medications on file prior to visit.   Review of Systems All otherwise neg per pt     Objective:   Physical Exam BP Readings from Last 3 Encounters:  05/27/14 142/80  05/06/14 120/84  04/22/14 140/92  BP  142/80 mmHg  Pulse 68  Temp(Src) 97.8 F (36.6 C) (Oral)  Resp 20  Ht 5\' 8"  (1.727 m)  Wt 278 lb 10.2 oz (126.39 kg)  BMI 42.38 kg/m2  SpO2 97% VS noted,  Constitutional: Pt appears in no significant distress HENT: Head: NCAT.  Right Ear: External ear normal.  Left Ear: External ear normal.  Eyes: . Pupils are equal, round, and reactive to light. Conjunctivae and EOM are normal Neck: Normal range of motion. Neck supple.  Cardiovascular: Normal rate and regular rhythm.   Pulmonary/Chest: Effort normal and breath sounds without rales or wheezing.  Neurological: Pt is alert. Not confused , motor grossly intact Skin: Skin is warm. No rash, no LE edema Psychiatric: Pt behavior is normal. No agitation.     Assessment & Plan:

## 2014-05-27 NOTE — Progress Notes (Signed)
Pre visit review using our clinic review tool, if applicable. No additional management support is needed unless otherwise documented below in the visit note. 

## 2014-05-28 NOTE — Assessment & Plan Note (Signed)
stable overall by history and exam, recent data reviewed with pt, and pt ok to change verapamil cap 200 to 180 mg tablet due to cost ,  to f/u any worsening symptoms or concerns BP Readings from Last 3 Encounters:  05/27/14 142/80  05/06/14 120/84  04/22/14 140/92

## 2014-06-02 DIAGNOSIS — G4733 Obstructive sleep apnea (adult) (pediatric): Secondary | ICD-10-CM | POA: Diagnosis not present

## 2014-06-04 ENCOUNTER — Telehealth: Payer: Self-pay | Admitting: Internal Medicine

## 2014-06-04 MED ORDER — VERAPAMIL HCL ER 240 MG PO CP24: 240.0000 mg | ORAL_CAPSULE | Freq: Every day | ORAL | Status: DC

## 2014-06-04 MED ORDER — VERAPAMIL HCL ER 240 MG PO TBCR: 240.0000 mg | EXTENDED_RELEASE_TABLET | Freq: Every day | ORAL | Status: DC

## 2014-06-04 NOTE — Telephone Encounter (Signed)
Notified pt md change to tablets...Calvin [name]

## 2014-06-04 NOTE — Addendum Note (Signed)
Addended by: Earnstine Regal on: 06/04/2014 11:37 AM   Modules accepted: Orders

## 2014-06-04 NOTE — Telephone Encounter (Signed)
See previous phone note.  Patient is requesting tablets of verelan pm to be sent to pharmacy instead of capsules.

## 2014-06-04 NOTE — Telephone Encounter (Signed)
For cherina to let pt know  Med list was reviewed again  He does not need to be on amlodipine 5 mg daily in addition to the Verapamil ER since they are somewhat similar  Ok to d/c the amloidipine  Also to increase the verapamil ER to 240 qd (done to [facility])

## 2014-06-04 NOTE — Telephone Encounter (Signed)
Notified pt with md response. Sent 30 day to [facility] so pt can get started...Calvin [name]

## 2014-06-04 NOTE — Telephone Encounter (Signed)
[place] for tab - I think I sent in the right one  Let me know if not done right, thanks

## 2014-06-08 DIAGNOSIS — G4733 Obstructive sleep apnea (adult) (pediatric): Secondary | ICD-10-CM | POA: Diagnosis not present

## 2014-07-03 DIAGNOSIS — G4733 Obstructive sleep apnea (adult) (pediatric): Secondary | ICD-10-CM | POA: Diagnosis not present

## 2014-07-10 ENCOUNTER — Ambulatory Visit (INDEPENDENT_AMBULATORY_CARE_PROVIDER_SITE_OTHER): Payer: Medicare Other | Admitting: Family Medicine

## 2014-07-10 ENCOUNTER — Telehealth: Payer: Self-pay | Admitting: Family Medicine

## 2014-07-10 ENCOUNTER — Encounter: Payer: Self-pay | Admitting: Family Medicine

## 2014-07-10 VITALS — BP 130/76 | HR 76 | Resp 10 | Ht 68.0 in | Wt 275.0 lb

## 2014-07-10 DIAGNOSIS — M25562 Pain in left knee: Secondary | ICD-10-CM

## 2014-07-10 DIAGNOSIS — M25561 Pain in right knee: Secondary | ICD-10-CM

## 2014-07-10 DIAGNOSIS — M1710 Unilateral primary osteoarthritis, unspecified knee: Secondary | ICD-10-CM

## 2014-07-10 DIAGNOSIS — M179 Osteoarthritis of knee, unspecified: Secondary | ICD-10-CM

## 2014-07-10 NOTE — Assessment & Plan Note (Addendum)
Injection bilaterally patient continue oral anti-inflammatories, topical antibiotic inflammatory's, icing protocol and home exercises. Patient is also to do some bracing. Patient will follow-up with me again next month. Patient may be a candidate for viscous supplementation.  Spent  25 minutes with patient face-to-face and had greater than 50% of counseling including as described above in assessment and plan.

## 2014-07-10 NOTE — Progress Notes (Signed)
CC: bilateral knee pain  Patient is coming in for follow-up of his bilateral knee pain. Patient did have an acute meniscal tear of the left knee but did improve after an injection greater than 3 months ago. Patient was doing very well but is having worsening symptoms again. Patient discusses it as more of a dull, throbbing, aching sensation. No significant instability of the knee but unfortunately given him too much pain to walk on a regular basis. Patient is going out of town and would like to see if another injection is possible.  Past medical, surgical, family and social history reviewed. Medications reviewed all in the electronic medical record.   Review of Systems: No headache, visual changes, nausea, vomiting, diarrhea, constipation, dizziness, abdominal pain, skin rash, fevers, chills, night sweats, weight loss, swollen lymph nodes, body aches, joint swelling, muscle aches, chest pain, shortness of breath, mood changes.   Objective:    Blood pressure 130/76, pulse 76, resp. rate 10, height 5\' 8"  (1.727 m), weight 275 lb (124.739 kg), SpO2 98 %.   General: No apparent distress alert and oriented x3 mood and affect normal, dressed appropriately. Obese male HEENT: Pupils equal, extraocular movements intact Respiratory: Patient's speak in full sentences and does not appear short of breath Cardiovascular: No lower extremity edema, non tender, no erythema Skin: Warm dry intact with no signs of infection or rash on extremities or on axial skeleton. Abdomen: Soft nontender Neuro: Cranial nerves II through XII are intact, neurovascularly intact in all extremities with 2+ DTRs and 2+ pulses. Lymph: No lymphadenopathy of posterior or anterior cervical chain or axillae bilaterally.  Gait normal with good balance and coordination.  MSK: Non tender with full range of motion and good stability and symmetric strength and tone of shoulders, elbows, wrist, hip,  and ankles bilaterally.  Knee:  bilateral Inspection shows no significant discoloration of skin which is an improvement from previous exam. The patient shows the patient is having severe bilateral knee pain. Mostly over the medial joint line. Range of motion is full but does have some pain with full flexion Ligaments with solid consistent endpoints including ACL, PCL, LCL, MCL. Symmetric to contralateral side Negative Mcmurray's, Apley's, and Thessalonian tests. Non painful patellar compression. Patellar glide without crepitus. Patellar and quadriceps tendons unremarkable. Hamstring and quadriceps strength is normal.  Contralateral knee is significantly better and now  negativeMcMurray's. Moderate improvement from previous exam.  Procedure: Real-time Ultrasound Guided Injection of right knee Device: GE Logiq E  Ultrasound guided injection is preferred based studies that show increased duration, increased effect, greater accuracy, decreased procedural pain, increased response rate, and decreased cost with ultrasound guided versus blind injection.  Verbal informed consent obtained.  Time-out conducted.  Noted no overlying erythema, induration, or other signs of local infection.  Skin prepped in a sterile fashion.  Local anesthesia: Topical Ethyl chloride.  With sterile technique and under real time ultrasound guidance: With a 22-gauge 2 inch needle patient was injected with 4 cc of 0.5% Marcaine and 1 cc of Kenalog 40 mg/dL. This was from a superior lateral approach.  Completed without difficulty  Pain immediately resolved suggesting accurate placement of the medication.  Advised to call if fevers/chills, erythema, induration, drainage, or persistent bleeding.  Images permanently stored and available for review in the ultrasound unit.  Impression: Technically successful ultrasound guided injection.   Procedure: Real-time Ultrasound Guided Injection of left knee Device: GE Logiq E  Ultrasound guided injection is  preferred based studies that show increased  duration, increased effect, greater accuracy, decreased procedural pain, increased response rate, and decreased cost with ultrasound guided versus blind injection.  Verbal informed consent obtained.  Time-out conducted.  Noted no overlying erythema, induration, or other signs of local infection.  Skin prepped in a sterile fashion.  Local anesthesia: Topical Ethyl chloride.  With sterile technique and under real time ultrasound guidance: With a 22-gauge 2 inch needle patient was injected with 4 cc of 0.5% Marcaine and 1 cc of Kenalog 40 mg/dL. This was from a superior lateral approach.  Completed without difficulty  Pain immediately resolved suggesting accurate placement of the medication.  Advised to call if fevers/chills, erythema, induration, drainage, or persistent bleeding.  Images permanently stored and available for review in the ultrasound unit.  Impression: Technically successful ultrasound guided injection. .  Impression and Recommendations:     This case required medical decision making of moderate complexity.

## 2014-07-10 NOTE — Patient Instructions (Signed)
Good to see you Have a great trip Ice is your friend Continue the medicines and the vitamins See me again in 4 weeks.

## 2014-07-10 NOTE — Telephone Encounter (Signed)
Patient is requesting an injection in his knee as soon as possible.  I have scheduled patient for Wed but patient is hoping to be worked in on Monday.  Please follow up with patient in regards.

## 2014-07-13 DIAGNOSIS — G4733 Obstructive sleep apnea (adult) (pediatric): Secondary | ICD-10-CM | POA: Diagnosis not present

## 2014-07-13 NOTE — Telephone Encounter (Signed)
Dr Tamala Julian did injections on Friday.

## 2014-07-14 ENCOUNTER — Encounter: Payer: Self-pay | Admitting: Family Medicine

## 2014-07-15 ENCOUNTER — Ambulatory Visit: Payer: Medicare Other | Admitting: Family Medicine

## 2014-07-20 ENCOUNTER — Telehealth: Payer: Self-pay | Admitting: *Deleted

## 2014-07-20 DIAGNOSIS — G4733 Obstructive sleep apnea (adult) (pediatric): Secondary | ICD-10-CM | POA: Diagnosis not present

## 2014-07-20 NOTE — Telephone Encounter (Signed)
Received fax pt needing PA on his Indomethacin. Completed PA on cover-my-meds waiting on approval status...Calvin [name]

## 2014-07-20 NOTE — Telephone Encounter (Signed)
Received PA back med has been denied. It states plan does not cover this drug unless pt has tried and failed celebrex, colchicine, etodalac, diflunisal, ketoprofen, naproxen, oxaprozin, and sulindac. Gave PA response to md for his recommendation...Calvin [name]

## 2014-07-21 ENCOUNTER — Encounter: Payer: Self-pay | Admitting: Internal Medicine

## 2014-07-21 MED ORDER — INDOMETHACIN 50 MG PO CAPS: ORAL_CAPSULE | ORAL | Status: DC

## 2014-07-22 ENCOUNTER — Encounter: Payer: Self-pay | Admitting: Internal Medicine

## 2014-07-22 MED ORDER — INDOMETHACIN 50 MG PO CAPS: ORAL_CAPSULE | ORAL | Status: DC

## 2014-07-22 NOTE — Telephone Encounter (Signed)
Pt has sent md email regarding denial for Indomethicin md gave his recommendation....Calvin [name]

## 2014-07-22 NOTE — Addendum Note (Signed)
Addended by: Biagio Borg on: 07/22/2014 09:29 PM   Modules accepted: Orders

## 2014-07-24 ENCOUNTER — Other Ambulatory Visit: Payer: Self-pay

## 2014-07-24 MED ORDER — INDOMETHACIN 50 MG PO CAPS: ORAL_CAPSULE | ORAL | Status: DC

## 2014-08-02 DIAGNOSIS — G4733 Obstructive sleep apnea (adult) (pediatric): Secondary | ICD-10-CM | POA: Diagnosis not present

## 2014-09-02 DIAGNOSIS — G4733 Obstructive sleep apnea (adult) (pediatric): Secondary | ICD-10-CM | POA: Diagnosis not present

## 2014-09-06 ENCOUNTER — Encounter: Payer: Self-pay | Admitting: Internal Medicine

## 2014-09-07 DIAGNOSIS — G4733 Obstructive sleep apnea (adult) (pediatric): Secondary | ICD-10-CM | POA: Diagnosis not present

## 2014-09-07 MED ORDER — VERAPAMIL HCL ER 240 MG PO TBCR: 240.0000 mg | EXTENDED_RELEASE_TABLET | Freq: Every day | ORAL | Status: DC

## 2014-09-22 DIAGNOSIS — L820 Inflamed seborrheic keratosis: Secondary | ICD-10-CM | POA: Diagnosis not present

## 2014-09-22 DIAGNOSIS — L821 Other seborrheic keratosis: Secondary | ICD-10-CM | POA: Diagnosis not present

## 2014-10-02 DIAGNOSIS — G4733 Obstructive sleep apnea (adult) (pediatric): Secondary | ICD-10-CM | POA: Diagnosis not present

## 2014-10-12 DIAGNOSIS — G4733 Obstructive sleep apnea (adult) (pediatric): Secondary | ICD-10-CM | POA: Diagnosis not present

## 2014-10-20 DIAGNOSIS — L820 Inflamed seborrheic keratosis: Secondary | ICD-10-CM | POA: Diagnosis not present

## 2014-10-20 DIAGNOSIS — L720 Epidermal cyst: Secondary | ICD-10-CM | POA: Diagnosis not present

## 2014-11-02 DIAGNOSIS — G4733 Obstructive sleep apnea (adult) (pediatric): Secondary | ICD-10-CM | POA: Diagnosis not present

## 2014-11-11 DIAGNOSIS — G4733 Obstructive sleep apnea (adult) (pediatric): Secondary | ICD-10-CM | POA: Diagnosis not present

## 2014-11-26 ENCOUNTER — Encounter: Payer: Self-pay | Admitting: Internal Medicine

## 2014-12-03 DIAGNOSIS — G4733 Obstructive sleep apnea (adult) (pediatric): Secondary | ICD-10-CM | POA: Diagnosis not present

## 2014-12-11 DIAGNOSIS — G4733 Obstructive sleep apnea (adult) (pediatric): Secondary | ICD-10-CM | POA: Diagnosis not present

## 2014-12-28 DIAGNOSIS — R972 Elevated prostate specific antigen [PSA]: Secondary | ICD-10-CM | POA: Diagnosis not present

## 2014-12-28 DIAGNOSIS — N5201 Erectile dysfunction due to arterial insufficiency: Secondary | ICD-10-CM | POA: Diagnosis not present

## 2014-12-28 DIAGNOSIS — N401 Enlarged prostate with lower urinary tract symptoms: Secondary | ICD-10-CM | POA: Diagnosis not present

## 2014-12-28 DIAGNOSIS — R3912 Poor urinary stream: Secondary | ICD-10-CM | POA: Diagnosis not present

## 2015-01-02 DIAGNOSIS — G4733 Obstructive sleep apnea (adult) (pediatric): Secondary | ICD-10-CM | POA: Diagnosis not present

## 2015-01-11 DIAGNOSIS — G4733 Obstructive sleep apnea (adult) (pediatric): Secondary | ICD-10-CM | POA: Diagnosis not present

## 2015-01-12 ENCOUNTER — Encounter: Payer: Self-pay | Admitting: Internal Medicine

## 2015-01-12 ENCOUNTER — Ambulatory Visit (INDEPENDENT_AMBULATORY_CARE_PROVIDER_SITE_OTHER): Payer: Medicare Other | Admitting: Internal Medicine

## 2015-01-12 VITALS — BP 124/80 | HR 52 | Temp 97.9°F | Ht 68.0 in | Wt 261.0 lb

## 2015-01-12 DIAGNOSIS — R52 Pain, unspecified: Secondary | ICD-10-CM

## 2015-01-12 DIAGNOSIS — I10 Essential (primary) hypertension: Secondary | ICD-10-CM | POA: Diagnosis not present

## 2015-01-12 DIAGNOSIS — Z0000 Encounter for general adult medical examination without abnormal findings: Secondary | ICD-10-CM

## 2015-01-12 MED ORDER — CYCLOBENZAPRINE HCL 5 MG PO TABS: 5.0000 mg | ORAL_TABLET | Freq: Three times a day (TID) | ORAL | Status: DC | PRN

## 2015-01-12 NOTE — Progress Notes (Signed)
Subjective:    Patient ID: Calvin [name], male    DOB: 11/14/1950, 64 y.o.   MRN: [id number]  HPI  Here with 1 wk onset right lateral abd/side pain, sore and tender to touch but Denies worsening reflux, abd pain, dysphagia, n/v, bowel change or blood.  No fever. Denies urinary symptoms such as dysuria, frequency, urgency, flank pain, hematuria or n/v, fever, chills.  Started after working in yard.  Worse to twist at the waist left and right.  Pt denies new neurological symptoms such as new headache, or facial or extremity weakness or numbness   Pt denies polydipsia, polyuria Past Medical History  Diagnosis Date  . Hyperlipidemia   . Hypertension   . Gout   . Diverticulosis   . OSA (obstructive sleep apnea) 11/13/2013   Past Surgical History  Procedure Laterality Date  . Rotator cuff repair      left  . Throat surgery      polyps removed from vocal cord  . Polypectomy    . Colonoscopy      reports that he quit smoking about 37 years ago. His smoking use included Cigarettes. He has a 10 pack-year smoking history. He has never used smokeless tobacco. He reports that he does not drink alcohol or use illicit drugs. family history is not on file. No Known Allergies Current Outpatient Prescriptions on File Prior to Visit  Medication Sig Dispense Refill  . allopurinol (ZYLOPRIM) 100 MG tablet Take 2 tablets (200 mg total) by mouth daily. 180 tablet 3  . aspirin 81 MG tablet Take 81 mg by mouth daily.      Marland Kitchen COLCRYS 0.6 MG tablet take 1 tablet by mouth daily (Patient taking differently: take 1 tablet by mouth daily prn) 90 tablet 3  . finasteride (PROSCAR) 5 MG tablet Take 1 tablet (5 mg total) by mouth daily. 90 tablet 3  . fluticasone (FLONASE) 50 MCG/ACT nasal spray Place 2 sprays into the nose daily. (Patient taking differently: Place 2 sprays into the nose daily as needed. ) 48 g 3  . glucosamine-chondroitin 500-400 MG tablet Take 1 tablet by mouth daily.      . indomethacin (INDOCIN) 50  MG capsule take 1 capsule by mouth three times a day if needed 90 capsule 1  . irbesartan (AVAPRO) 300 MG tablet take 1 tablet by mouth once daily 90 tablet 3  . naproxen (NAPROSYN) 500 MG tablet Take 1 tablet (500 mg total) by mouth 2 (two) times daily with a meal. 180 tablet 3  . pravastatin (PRAVACHOL) 40 MG tablet take 1 tablet by mouth once daily 30 tablet 3  . sildenafil (VIAGRA) 100 MG tablet Take 1 tablet (100 mg total) by mouth as needed for erectile dysfunction. 10 tablet 11  . tamsulosin (FLOMAX) 0.4 MG CAPS capsule take 1 capsule by mouth once daily 90 capsule 3  . verapamil (CALAN-SR) 240 MG CR tablet Take 1 tablet (240 mg total) by mouth at bedtime. 90 tablet 3  . diclofenac sodium (VOLTAREN) 1 % GEL Apply 2 g topically 2 (two) times daily. (Patient not taking: Reported on 01/12/2015) 100 g 3  . vitamin C (ASCORBIC ACID) 500 MG tablet Take 500 mg by mouth daily.       No current facility-administered medications on file prior to visit.   Review of Systems  All otherwise neg per pt       Objective:   Physical Exam BP 124/80 mmHg  Pulse 52  Temp(Src) 97.9 F (  36.6 C) (Oral)  Ht 5\' 8"  (1.727 m)  Wt 261 lb (118.389 kg)  BMI 39.69 kg/m2  SpO2 98% VS noted,  Constitutional: Pt appears in no significant distress HENT: Head: NCAT.  Right Ear: External ear normal.  Left Ear: External ear normal.  Eyes: . Pupils are equal, round, and reactive to light. Conjunctivae and EOM are normal Neck: Normal range of motion. Neck supple.  Cardiovascular: Normal rate and regular rhythm.   Pulmonary/Chest: Effort normal and breath sounds without rales or wheezing.  Abd:  Soft, NT, ND, + BS, mild tender right side approx t11 dermatome without rash or swelling Neurological: Pt is alert. Not confused , motor grossly intact Skin: Skin is warm. No rash, no LE edema Psychiatric: Pt behavior is normal. No agitation.     Assessment & Plan:

## 2015-01-12 NOTE — Patient Instructions (Addendum)
Please take all new medication as prescribed - the muscle relaxer  Please continue all other medications as before, and refills have been done if requested.  Please have the pharmacy call with any other refills you may need.  Please continue your efforts at being more active, low cholesterol diet, and weight control.  You are otherwise up to date with prevention measures today.  Please keep your appointments with your specialists as you may have planned  Please return in Feb 2017 or sooner if needed, with Lab testing done 3-5 days before

## 2015-01-16 DIAGNOSIS — R52 Pain, unspecified: Secondary | ICD-10-CM | POA: Insufficient documentation

## 2015-01-16 NOTE — Assessment & Plan Note (Addendum)
Hx and exam c/w  Likely msk discomfort, for muscle relaxer, I feel likely does not need further evaluation such as UA or other,  to f/u any worsening symptoms or concerns

## 2015-01-16 NOTE — Assessment & Plan Note (Signed)
stable overall by history and exam, recent data reviewed with pt, and pt to continue medical treatment as before,  to f/u any worsening symptoms or concerns BP Readings from Last 3 Encounters:  01/12/15 124/80  07/10/14 130/76  05/27/14 142/80

## 2015-02-02 DIAGNOSIS — G4733 Obstructive sleep apnea (adult) (pediatric): Secondary | ICD-10-CM | POA: Diagnosis not present

## 2015-02-10 DIAGNOSIS — G4733 Obstructive sleep apnea (adult) (pediatric): Secondary | ICD-10-CM | POA: Diagnosis not present

## 2015-03-03 ENCOUNTER — Ambulatory Visit (INDEPENDENT_AMBULATORY_CARE_PROVIDER_SITE_OTHER): Payer: Medicare Other | Admitting: Family Medicine

## 2015-03-03 ENCOUNTER — Encounter: Payer: Self-pay | Admitting: Family Medicine

## 2015-03-03 ENCOUNTER — Ambulatory Visit (INDEPENDENT_AMBULATORY_CARE_PROVIDER_SITE_OTHER): Payer: Medicare Other

## 2015-03-03 DIAGNOSIS — M7551 Bursitis of right shoulder: Secondary | ICD-10-CM | POA: Insufficient documentation

## 2015-03-03 DIAGNOSIS — M25511 Pain in right shoulder: Secondary | ICD-10-CM

## 2015-03-03 NOTE — Progress Notes (Signed)
[name] [place], [place] [address] Phone: [phone] Subjective:    \  CC: right shoulder pain  [id number] Calvin [name] is a 64 y.o. male coming in with complaint of right shoulder pain. Patient describes the pain as more of a dull, throbbing aching sensation that has been weeks. Not affecting his daily activities but unfortunately does give him discomfort at night. Patient feels that his gout has been well controlled. Does not think that this is associated with it. Denies any radiation down the arm or any numbness or tingling. Denies any weakness. Rates the severity of pain though is 5 out of 10 at night. It is affecting his sleep regularly. Anti-inflammatories is somewhat beneficial.     Past Medical History  Diagnosis Date  . Hyperlipidemia   . Hypertension   . Gout   . Diverticulosis   . OSA (obstructive sleep apnea) 11/13/2013   Past Surgical History  Procedure Laterality Date  . Rotator cuff repair      left  . Throat surgery      polyps removed from vocal cord  . Polypectomy    . Colonoscopy     Social History  Substance Use Topics  . Smoking status: Former Smoker -- 2.00 packs/day for 5 years    Types: Cigarettes    Quit date: 05/23/1977  . Smokeless tobacco: Never Used  . Alcohol Use: No     Comment: QUIT 2002   No Known Allergies No family history on file.  Past medical history, social, surgical and family history all reviewed in electronic medical record.   Review of Systems: No headache, visual changes, nausea, vomiting, diarrhea, constipation, dizziness, abdominal pain, skin rash, fevers, chills, night sweats, weight loss, swollen lymph nodes, body aches, joint swelling, muscle aches, chest pain, shortness of breath, mood changes.   Objective There were no vitals taken for this visit.  General: No apparent distress alert and oriented x3 mood and affect normal, dressed appropriately.  HEENT: Pupils  equal, extraocular movements intact  Respiratory: Patient's speak in full sentences and does not appear short of breath  Cardiovascular: No lower extremity edema, non tender, no erythema  Skin: Warm dry intact with no signs of infection or rash on extremities or on axial skeleton.  Abdomen: Soft nontender  Neuro: Cranial nerves II through XII are intact, neurovascularly intact in all extremities with 2+ DTRs and 2+ pulses.  Lymph: No lymphadenopathy of posterior or anterior cervical chain or axillae bilaterally.  Gait normal with good balance and coordination.  MSK:  Non tender with full range of motion and good stability and symmetric strength and tone of  elbows, wrist, hip, knee and ankles bilaterally.  Shoulder: Right Inspection reveals no abnormalities, atrophy or asymmetry. Palpation is normal with no tenderness over AC joint or bicipital groove. ROM is full in all planes passively. Rotator cuff strength normal throughout. signs of impingement with positive Neer and Hawkin's tests, but negative empty can sign. Speeds and Yergason's tests normal. No labral pathology noted with negative Obrien's, negative clunk and good stability. Normal scapular function observed. No painful arc and no drop arm sign. No apprehension sign  MSK US performed of: Right This study was ordered, performed, and interpreted by Charlann Boxer D.O.  Shoulder:   Supraspinatus:  Appears normal on long and transverse views, Bursal bulge seen with shoulder abduction on impingement view. Infraspinatus:  Appears normal on long and transverse views. Significant increase in Doppler flow Subscapularis:  Appears normal on long and transverse views. Positive bursa Teres Minor:  Appears normal on long and transverse views. AC joint:  Capsule undistended, no geyser sign. Glenohumeral Joint:  Appears normal without effusion. Glenoid Labrum:  Intact without visualized tears. Biceps Tendon:  Appears normal on long and  transverse views, no fraying of tendon, tendon located in intertubercular groove, no subluxation with shoulder internal or external rotation.  Impression: Subacromial bursitis  Procedure: Real-time Ultrasound Guided Injection of right glenohumeral joint Device: GE Logiq E  Ultrasound guided injection is preferred based studies that show increased duration, increased effect, greater accuracy, decreased procedural pain, increased response rate with ultrasound guided versus blind injection.  Verbal informed consent obtained.  Time-out conducted.  Noted no overlying erythema, induration, or other signs of local infection.  Skin prepped in a sterile fashion.  Local anesthesia: Topical Ethyl chloride.  With sterile technique and under real time ultrasound guidance:  Joint visualized.  23g 1  inch needle inserted posterior approach. Pictures taken for needle placement. Patient did have injection of 2 cc of 1% lidocaine, 2 cc of 0.5% Marcaine, and 1.0 cc of Kenalog 40 mg/dL. Completed without difficulty  Pain immediately resolved suggesting accurate placement of the medication.  Advised to call if fevers/chills, erythema, induration, drainage, or persistent bleeding.  Images permanently stored and available for review in the ultrasound unit.  Impression: Technically successful ultrasound guided injection.    Impression and Recommendations:     This case required medical decision making of moderate complexity.

## 2015-03-03 NOTE — Assessment & Plan Note (Signed)
She given an injection today and tolerated the procedure well. Patient given home exercises and will try topical anti-inflammatories, icing, and we discussed over-the-counter supplementation. Patient will remain active. We will see him back again in 3-4 weeks for further evaluation and treatment.

## 2015-03-03 NOTE — Patient Instructions (Addendum)
Good to see you Ice 20 minutes 2 times daily. Usually after activity and before bed. Exercises 3 times a week.  pennsaid pinkie amount topically 2 times daily as needed. Steal some of your wifes Have a great anniversary  Happy holidays!  See me again in 4 weeks if not happy with it.

## 2015-03-03 NOTE — Progress Notes (Signed)
Pre visit review using our clinic review tool, if applicable. No additional management support is needed unless otherwise documented below in the visit note. 

## 2015-03-04 DIAGNOSIS — G4733 Obstructive sleep apnea (adult) (pediatric): Secondary | ICD-10-CM | POA: Diagnosis not present

## 2015-03-05 ENCOUNTER — Ambulatory Visit: Payer: Medicare Other | Admitting: Family Medicine

## 2015-03-12 DIAGNOSIS — G4733 Obstructive sleep apnea (adult) (pediatric): Secondary | ICD-10-CM | POA: Diagnosis not present

## 2015-03-16 ENCOUNTER — Other Ambulatory Visit: Payer: Self-pay | Admitting: Internal Medicine

## 2015-03-22 ENCOUNTER — Encounter: Payer: Self-pay | Admitting: Family Medicine

## 2015-04-04 DIAGNOSIS — G4733 Obstructive sleep apnea (adult) (pediatric): Secondary | ICD-10-CM | POA: Diagnosis not present

## 2015-04-06 ENCOUNTER — Other Ambulatory Visit: Payer: Self-pay | Admitting: Internal Medicine

## 2015-04-11 DIAGNOSIS — G4733 Obstructive sleep apnea (adult) (pediatric): Secondary | ICD-10-CM | POA: Diagnosis not present

## 2015-04-14 ENCOUNTER — Other Ambulatory Visit (INDEPENDENT_AMBULATORY_CARE_PROVIDER_SITE_OTHER): Payer: Medicare Other

## 2015-04-14 ENCOUNTER — Telehealth: Payer: Self-pay | Admitting: Internal Medicine

## 2015-04-14 ENCOUNTER — Telehealth: Payer: Self-pay

## 2015-04-14 DIAGNOSIS — Z0000 Encounter for general adult medical examination without abnormal findings: Secondary | ICD-10-CM

## 2015-04-14 DIAGNOSIS — Z0189 Encounter for other specified special examinations: Secondary | ICD-10-CM

## 2015-04-14 DIAGNOSIS — E785 Hyperlipidemia, unspecified: Secondary | ICD-10-CM | POA: Diagnosis not present

## 2015-04-14 LAB — CBC WITH DIFFERENTIAL/PLATELET
Basophils Absolute: 0 10*3/uL (ref 0.0–0.1)
Basophils Relative: 0.3 % (ref 0.0–3.0)
EOS PCT: 1.6 % (ref 0.0–5.0)
Eosinophils Absolute: 0.1 10*3/uL (ref 0.0–0.7)
HCT: 36.5 % — ABNORMAL LOW (ref 39.0–52.0)
HEMOGLOBIN: 12.1 g/dL — AB (ref 13.0–17.0)
LYMPHS ABS: 1.6 10*3/uL (ref 0.7–4.0)
Lymphocytes Relative: 33.5 % (ref 12.0–46.0)
MCHC: 33.3 g/dL (ref 30.0–36.0)
MCV: 86.9 fl (ref 78.0–100.0)
MONOS PCT: 6.9 % (ref 3.0–12.0)
Monocytes Absolute: 0.3 10*3/uL (ref 0.1–1.0)
Neutro Abs: 2.8 10*3/uL (ref 1.4–7.7)
Neutrophils Relative %: 57.7 % (ref 43.0–77.0)
Platelets: 190 10*3/uL (ref 150.0–400.0)
RBC: 4.2 Mil/uL — AB (ref 4.22–5.81)
RDW: 14.9 % (ref 11.5–15.5)
WBC: 4.9 10*3/uL (ref 4.0–10.5)

## 2015-04-14 LAB — LIPID PANEL
CHOL/HDL RATIO: 6
CHOLESTEROL: 139 mg/dL (ref 0–200)
HDL: 24.4 mg/dL — ABNORMAL LOW (ref >39.00)
LDL CALC: 83 mg/dL (ref 0–99)
NonHDL: 114.66
Triglycerides: 158 mg/dL — ABNORMAL HIGH (ref 0.0–149.0)
VLDL: 31.6 mg/dL (ref 0.0–40.0)

## 2015-04-14 LAB — URINALYSIS, ROUTINE W REFLEX MICROSCOPIC
Bilirubin Urine: NEGATIVE
HGB URINE DIPSTICK: NEGATIVE
Ketones, ur: NEGATIVE
LEUKOCYTES UA: NEGATIVE
NITRITE: NEGATIVE
RBC / HPF: NONE SEEN (ref 0–?)
Specific Gravity, Urine: 1.01 (ref 1.000–1.030)
Total Protein, Urine: NEGATIVE
Urine Glucose: NEGATIVE
Urobilinogen, UA: 0.2 (ref 0.0–1.0)
WBC, UA: NONE SEEN (ref 0–?)
pH: 6 (ref 5.0–8.0)

## 2015-04-14 LAB — BASIC METABOLIC PANEL
BUN: 15 mg/dL (ref 6–23)
CALCIUM: 9.2 mg/dL (ref 8.4–10.5)
CO2: 27 mEq/L (ref 19–32)
Chloride: 107 mEq/L (ref 96–112)
Creatinine, Ser: 1.21 mg/dL (ref 0.40–1.50)
GFR: 77 mL/min (ref >60.00)
GLUCOSE: 96 mg/dL (ref 70–99)
Potassium: 4.3 mEq/L (ref 3.5–5.1)
SODIUM: 143 meq/L (ref 135–145)

## 2015-04-14 LAB — HEPATIC FUNCTION PANEL
ALBUMIN: 4.3 g/dL (ref 3.5–5.2)
ALK PHOS: 80 U/L (ref 39–117)
ALT: 17 U/L (ref 0–53)
AST: 18 U/L (ref 0–37)
BILIRUBIN TOTAL: 0.5 mg/dL (ref 0.2–1.2)
Bilirubin, Direct: 0.1 mg/dL (ref 0.0–0.3)
Total Protein: 7.3 g/dL (ref 6.0–8.3)

## 2015-04-14 LAB — TSH: TSH: 0.86 u[IU]/mL (ref 0.35–4.50)

## 2015-04-14 LAB — PSA: PSA: 1.88 ng/mL (ref 0.10–4.00)

## 2015-04-14 NOTE — Telephone Encounter (Signed)
Pt came in today and had his labs done. Mychart told him to get a Hep C test. They went ahead and did the lab for the hep c but they are needing you to put the order in for it.

## 2015-04-14 NOTE — Telephone Encounter (Signed)
Patient called in and needed Hep C drawn with his other labs. Lab went ahead and drew the lab work they just needed the order put in

## 2015-04-15 LAB — HEPATITIS C ANTIBODY: HCV AB: NEGATIVE

## 2015-04-23 ENCOUNTER — Ambulatory Visit (INDEPENDENT_AMBULATORY_CARE_PROVIDER_SITE_OTHER): Payer: Medicare Other | Admitting: Internal Medicine

## 2015-04-23 ENCOUNTER — Encounter: Payer: Self-pay | Admitting: Internal Medicine

## 2015-04-23 VITALS — BP 132/78 | HR 75 | Temp 98.6°F | Resp 20 | Wt 270.0 lb

## 2015-04-23 DIAGNOSIS — I10 Essential (primary) hypertension: Secondary | ICD-10-CM

## 2015-04-23 DIAGNOSIS — E785 Hyperlipidemia, unspecified: Secondary | ICD-10-CM

## 2015-04-23 DIAGNOSIS — R413 Other amnesia: Secondary | ICD-10-CM

## 2015-04-23 DIAGNOSIS — Z0000 Encounter for general adult medical examination without abnormal findings: Secondary | ICD-10-CM

## 2015-04-23 MED ORDER — HYDROCHLOROTHIAZIDE 25 MG PO TABS: ORAL_TABLET | ORAL | Status: DC

## 2015-04-23 NOTE — Progress Notes (Signed)
Pre visit review using our clinic review tool, if applicable. No additional management support is needed unless otherwise documented below in the visit note. 

## 2015-04-23 NOTE — Assessment & Plan Note (Signed)
C/w benign forgetfulness, ok to continue to monitor for now

## 2015-04-23 NOTE — Patient Instructions (Addendum)
Please take all new medication as prescribed - the mild fluid pill  Please continue all other medications as before, and refills have been done if requested.  Please have the pharmacy call with any other refills you may need.  Please continue your efforts at being more active, low cholesterol diet, and weight control.  You are otherwise up to date with prevention measures today.  Please keep your appointments with your specialists as you may have planned  Please return in 1 year for your yearly visit, or sooner if needed, with Lab testing done 3-5 days before

## 2015-04-23 NOTE — Assessment & Plan Note (Signed)

## 2015-04-23 NOTE — Progress Notes (Signed)
Subjective:    Patient ID: Calvin [name], male    DOB: [birth year], 65 y.o.   MRN: [id number]  HPI Here for wellness and f/u;  Overall doing ok;  Pt denies Chest pain, worsening SOB, DOE, wheezing, orthopnea, PND, worsening LE edema, palpitations, dizziness or syncope.  Pt denies neurological change such as new headache, facial or extremity weakness.  Pt denies polydipsia, polyuria, or low sugar symptoms. Pt states overall good compliance with treatment and medications, good tolerability, and has been trying to follow appropriate diet.  Pt denies worsening depressive symptoms, suicidal ideation or panic. No fever, night sweats, wt loss, loss of appetite, or other constitutional symptoms.  Pt states good ability with ADL's, has low fall risk, home safety reviewed and adequate, no other significant changes in hearing or vision, and only occasionally active with exercise. Now retired, working at home and community activities. No specific compalints Wt Readings from Last 3 Encounters:  04/23/15 270 lb (122.471 kg)  01/12/15 261 lb (118.389 kg)  07/10/14 275 lb (124.739 kg)  [facility] and [facility] gives [facility] mild elevated most often 140/'s to 150 sbp.  Mother with hx of dementia, and he has notice some benign forgetfulness in the last year, worried about this.  Has no other signficant ST memory issue.  Has some mild worseing hearing loss that wife reminds him of, but declines eval for this now.   Past Medical History  Diagnosis Date  . Hyperlipidemia   . Hypertension   . Gout   . Diverticulosis   . OSA (obstructive sleep apnea) 11/13/2013   Past Surgical History  Procedure Laterality Date  . Rotator cuff repair      left  . Throat surgery      polyps removed from vocal cord  . Polypectomy    . Colonoscopy      reports that he quit smoking about 37 years ago. His smoking use included Cigarettes. He has a 10 pack-year smoking history. He has never used smokeless tobacco. He reports that  he does not drink alcohol or use illicit drugs. family history is not on file. No Known Allergies Current Outpatient Prescriptions on File Prior to Visit  Medication Sig Dispense Refill  . allopurinol (ZYLOPRIM) 100 MG tablet Take 2 tablets (200 mg total) by mouth daily. 180 tablet 3  . aspirin 81 MG tablet Take 81 mg by mouth daily.      . colchicine 0.6 MG tablet take 1 tablet by mouth once daily 90 tablet 3  . cyclobenzaprine (FLEXERIL) 5 MG tablet Take 1 tablet (5 mg total) by mouth 3 (three) times daily as needed for muscle spasms. 60 tablet 1  . finasteride (PROSCAR) 5 MG tablet Take 1 tablet (5 mg total) by mouth daily. 90 tablet 3  . fluticasone (FLONASE) 50 MCG/ACT nasal spray Place 2 sprays into the nose daily. (Patient taking differently: Place 2 sprays into the nose daily as needed. ) 48 g 3  . glucosamine-chondroitin 500-400 MG tablet Take 1 tablet by mouth daily.      . indomethacin (INDOCIN) 50 MG capsule take 1 capsule by mouth three times a day if needed 90 capsule 1  . irbesartan (AVAPRO) 300 MG tablet take 1 tablet by mouth once daily 90 tablet 3  . naproxen (NAPROSYN) 500 MG tablet Take 1 tablet (500 mg total) by mouth 2 (two) times daily with a meal. 180 tablet 3  . pravastatin (PRAVACHOL) 40 MG tablet Take 1  tablet by mouth  daily 90 tablet 1  . sildenafil (VIAGRA) 100 MG tablet Take 1 tablet (100 mg total) by mouth as needed for erectile dysfunction. 10 tablet 11  . tamsulosin (FLOMAX) 0.4 MG CAPS capsule take 1 capsule by mouth once daily 90 capsule 3  . verapamil (CALAN-SR) 240 MG CR tablet Take 1 tablet (240 mg total) by mouth at bedtime. 90 tablet 3  . vitamin C (ASCORBIC ACID) 500 MG tablet Take 500 mg by mouth daily.       No current facility-administered medications on file prior to visit.    Review of Systems Constitutional: Negative for increased diaphoresis, other activity, appetite or siginficant weight change other than noted HENT: Negative for worsening  hearing loss, ear pain, facial swelling, mouth sores and neck stiffness.   Eyes: Negative for other worsening pain, redness or visual disturbance.  Respiratory: Negative for shortness of breath and wheezing  Cardiovascular: Negative for chest pain and palpitations.  Gastrointestinal: Negative for diarrhea, blood in stool, abdominal distention or other pain Genitourinary: Negative for hematuria, flank pain or change in urine volume.  Musculoskeletal: Negative for myalgias or other joint complaints.  Skin: Negative for color change and wound or drainage.  Neurological: Negative for syncope and numbness. other than noted Hematological: Negative for adenopathy. or other swelling Psychiatric/Behavioral: Negative for hallucinations, SI, self-injury, decreased concentration or other worsening agitation. ]     Objective:   Physical Exam BP 132/78 mmHg  Pulse 75  Temp(Src) 98.6 F (37 C) (Oral)  Resp 20  Wt 270 lb (122.471 kg)  SpO2 98% VS noted,  Constitutional: Pt is oriented to person, place, and time. Appears well-developed and well-nourished, in no significant distress Head: Normocephalic and atraumatic.  Right Ear: External ear normal.  Left Ear: External ear normal.  Nose: Nose normal.  Mouth/Throat: Oropharynx is clear and moist.  Eyes: Conjunctivae and EOM are normal. Pupils are equal, round, and reactive to light.  Neck: Normal range of motion. Neck supple. No JVD present. No tracheal deviation present or significant neck LA or mass Cardiovascular: Normal rate, regular rhythm, normal heart sounds and intact distal pulses.   Pulmonary/Chest: Effort normal and breath sounds without rales or wheezing  Abdominal: Soft. Bowel sounds are normal. NT. No HSM  Musculoskeletal: Normal range of motion. Exhibits no edema.  Lymphadenopathy:  Has no cervical adenopathy.  Neurological: Pt is alert and oriented to person, place, and time. Pt has normal reflexes. No cranial nerve deficit. Motor  grossly intact Skin: Skin is warm and dry. No rash noted.  Psychiatric:  Has normal mood and affect. Behavior is normal.     Assessment & Plan:

## 2015-04-23 NOTE — Assessment & Plan Note (Signed)
stable overall by history and exam, recent data reviewed with pt, and pt to continue medical treatment as before,  to f/u any worsening symptoms or concerns Lab Results  Component Value Date   LDLCALC 83 04/14/2015

## 2015-04-23 NOTE — Assessment & Plan Note (Signed)
Mild uncontrolled overall by BP at home, for add hct 12.5 qd, cont all other meds, f/u BP at home and next visit, to f/u any worsening symptoms or concerns BP Readings from Last 3 Encounters:  04/23/15 132/78  01/12/15 124/80  07/10/14 130/76

## 2015-04-30 ENCOUNTER — Ambulatory Visit (INDEPENDENT_AMBULATORY_CARE_PROVIDER_SITE_OTHER): Payer: Medicare Other | Admitting: Pulmonary Disease

## 2015-04-30 ENCOUNTER — Other Ambulatory Visit: Payer: Self-pay | Admitting: Internal Medicine

## 2015-04-30 ENCOUNTER — Encounter: Payer: Self-pay | Admitting: Pulmonary Disease

## 2015-04-30 VITALS — BP 120/86 | HR 67 | Ht 69.0 in | Wt 269.0 lb

## 2015-04-30 DIAGNOSIS — G4733 Obstructive sleep apnea (adult) (pediatric): Secondary | ICD-10-CM

## 2015-04-30 MED ORDER — TAMSULOSIN HCL 0.4 MG PO CAPS: 0.4000 mg | ORAL_CAPSULE | Freq: Every day | ORAL | Status: DC

## 2015-04-30 MED ORDER — ALLOPURINOL 100 MG PO TABS: 200.0000 mg | ORAL_TABLET | Freq: Every day | ORAL | Status: DC

## 2015-04-30 MED ORDER — NAPROXEN 500 MG PO TABS: 500.0000 mg | ORAL_TABLET | Freq: Two times a day (BID) | ORAL | Status: DC

## 2015-04-30 MED ORDER — IRBESARTAN 300 MG PO TABS: 300.0000 mg | ORAL_TABLET | Freq: Every day | ORAL | Status: DC

## 2015-04-30 NOTE — Telephone Encounter (Signed)
All done erx 

## 2015-04-30 NOTE — Progress Notes (Signed)
Current Outpatient Prescriptions on File Prior to Visit  Medication Sig  . allopurinol (ZYLOPRIM) 100 MG tablet Take 2 tablets (200 mg total) by mouth daily.  Marland Kitchen aspirin 81 MG tablet Take 81 mg by mouth daily.    . colchicine 0.6 MG tablet take 1 tablet by mouth once daily  . cyclobenzaprine (FLEXERIL) 5 MG tablet Take 1 tablet (5 mg total) by mouth 3 (three) times daily as needed for muscle spasms.  . finasteride (PROSCAR) 5 MG tablet Take 1 tablet (5 mg total) by mouth daily.  . fluticasone (FLONASE) 50 MCG/ACT nasal spray Place 2 sprays into the nose daily. (Patient taking differently: Place 2 sprays into the nose daily as needed. )  . glucosamine-chondroitin 500-400 MG tablet Take 1 tablet by mouth daily.    . hydrochlorothiazide (HYDRODIURIL) 25 MG tablet 1/2 tab by mouth per day  . indomethacin (INDOCIN) 50 MG capsule take 1 capsule by mouth three times a day if needed  . irbesartan (AVAPRO) 300 MG tablet take 1 tablet by mouth once daily  . naproxen (NAPROSYN) 500 MG tablet Take 1 tablet (500 mg total) by mouth 2 (two) times daily with a meal.  . pravastatin (PRAVACHOL) 40 MG tablet Take 1 tablet by mouth  daily  . sildenafil (VIAGRA) 100 MG tablet Take 1 tablet (100 mg total) by mouth as needed for erectile dysfunction.  . tamsulosin (FLOMAX) 0.4 MG CAPS capsule take 1 capsule by mouth once daily  . verapamil (CALAN-SR) 240 MG CR tablet Take 1 tablet (240 mg total) by mouth at bedtime.  . vitamin C (ASCORBIC ACID) 500 MG tablet Take 500 mg by mouth daily.     No current facility-administered medications on file prior to visit.     Chief Complaint  Patient presents with  . Follow-up    Wears CPAP nightly. Denies problems with mask/pressure. DME: [facility]     Tests HST 12/29/13 >> AHI 62.8, SaO2 low 71%. Auto CPAP 03/30/15 to 04/28/15 >> used on 30 of 30 nights with average 6 hrs 41 min.  Average AHI 3.6 with median CPAP 10 cm H2O and 95 th percentile CPAP 16 cm H2O  Past medical  hx HTN. HLD, Gout, Diverticulosis  Past surgical hx, Allergies, Family hx, Social hx all reviewed.  Vital Signs BP 120/86 mmHg  Pulse 67  Ht 5\' 9"  (1.753 m)  Wt 269 lb (122.018 kg)  BMI 39.71 kg/m2  SpO2 98%  History of Present Illness Calvin [name] is a 65 y.o. male with OSA.  He has been doing well with CPAP.  He has full face mask, and no issues with mask fit.  He gets good sleep and feels rested.  Physical Exam  General - No distress ENT - No sinus tenderness, no oral exudate, no LAN, MP 4, enlarged tongue Cardiac - s1s2 regular, no murmur Chest - No wheeze/rales/dullness Back - No focal tenderness Abd - Soft, non-tender Ext - No edema Neuro - Normal strength Skin - No rashes Psych - normal mood, and behavior   Assessment/Plan  Obstructive sleep apnea. He is compliant with therapy and reports benefit. Plan: - continue auto CPAP    Patient Instructions  Follow up in 1 year     [name], MD [place] Pager:  [phone]

## 2015-04-30 NOTE — Telephone Encounter (Signed)
Patient walked in to request that we submit RX's to his mail order- [facility]  tamsulosin (FLOMAX) 0.4 MG CAPS capsule [id number]  irbesartan (AVAPRO) 300 MG tablet [id number]  allopurinol (ZYLOPRIM) 100 MG tablet [id number]  naproxen (NAPROSYN) 500 MG tablet [id number]

## 2015-04-30 NOTE — Patient Instructions (Signed)
Follow up in 1 year.

## 2015-05-05 DIAGNOSIS — G4733 Obstructive sleep apnea (adult) (pediatric): Secondary | ICD-10-CM | POA: Diagnosis not present

## 2015-05-11 DIAGNOSIS — G4733 Obstructive sleep apnea (adult) (pediatric): Secondary | ICD-10-CM | POA: Diagnosis not present

## 2015-06-02 DIAGNOSIS — G4733 Obstructive sleep apnea (adult) (pediatric): Secondary | ICD-10-CM | POA: Diagnosis not present

## 2015-06-10 DIAGNOSIS — G4733 Obstructive sleep apnea (adult) (pediatric): Secondary | ICD-10-CM | POA: Diagnosis not present

## 2015-06-23 ENCOUNTER — Other Ambulatory Visit: Payer: Self-pay | Admitting: Internal Medicine

## 2015-07-03 DIAGNOSIS — G4733 Obstructive sleep apnea (adult) (pediatric): Secondary | ICD-10-CM | POA: Diagnosis not present

## 2015-07-10 DIAGNOSIS — G4733 Obstructive sleep apnea (adult) (pediatric): Secondary | ICD-10-CM | POA: Diagnosis not present

## 2015-07-15 ENCOUNTER — Encounter: Payer: Self-pay | Admitting: Internal Medicine

## 2015-07-15 ENCOUNTER — Ambulatory Visit (INDEPENDENT_AMBULATORY_CARE_PROVIDER_SITE_OTHER): Payer: Medicare Other | Admitting: Internal Medicine

## 2015-07-15 VITALS — BP 120/82 | HR 69 | Temp 97.8°F | Resp 20 | Wt 276.0 lb

## 2015-07-15 DIAGNOSIS — E785 Hyperlipidemia, unspecified: Secondary | ICD-10-CM | POA: Diagnosis not present

## 2015-07-15 DIAGNOSIS — I10 Essential (primary) hypertension: Secondary | ICD-10-CM

## 2015-07-15 NOTE — Progress Notes (Signed)
Subjective:    Patient ID: Calvin [name], male    DOB: [birth year], 65 y.o.   MRN: [id number]  HPI  Here to f/u; overall doing ok,  Pt denies chest pain, increasing sob or doe, wheezing, orthopnea, PND, increased LE swelling, palpitations, dizziness or syncope.  Pt denies new neurological symptoms such as new headache, or facial or extremity weakness or numbness.  Pt denies polydipsia, polyuria, or low sugar episode.   Pt denies new neurological symptoms such as new headache, or facial or extremity weakness or numbness.   Pt states overall good compliance with meds, mostly trying to follow appropriate diet, with wt overall stable,  but little exercise however. Concerned about the HCT and being in the sun. Wt Readings from Last 3 Encounters:  07/15/15 276 lb (125.193 kg)  04/30/15 269 lb (122.018 kg)  04/23/15 270 lb (122.471 kg)   Past Medical History  Diagnosis Date  . Hyperlipidemia   . Hypertension   . Gout   . Diverticulosis   . OSA (obstructive sleep apnea) 11/13/2013   Past Surgical History  Procedure Laterality Date  . Rotator cuff repair      left  . Throat surgery      polyps removed from vocal cord  . Polypectomy    . Colonoscopy      reports that he quit smoking about 38 years ago. His smoking use included Cigarettes. He has a 10 pack-year smoking history. He has never used smokeless tobacco. He reports that he does not drink alcohol or use illicit drugs. family history is not on file. No Known Allergies Current Outpatient Prescriptions on File Prior to Visit  Medication Sig Dispense Refill  . allopurinol (ZYLOPRIM) 100 MG tablet Take 2 tablets (200 mg total) by mouth daily. 180 tablet 3  . aspirin 81 MG tablet Take 81 mg by mouth daily.      . colchicine 0.6 MG tablet take 1 tablet by mouth once daily 90 tablet 3  . cyclobenzaprine (FLEXERIL) 5 MG tablet Take 1 tablet (5 mg total) by mouth 3 (three) times daily as needed for muscle spasms. 60 tablet 1  . finasteride  (PROSCAR) 5 MG tablet Take 1 tablet (5 mg total) by mouth daily. 90 tablet 3  . fluticasone (FLONASE) 50 MCG/ACT nasal spray Place 2 sprays into the nose daily. (Patient taking differently: Place 2 sprays into the nose daily as needed. ) 48 g 3  . glucosamine-chondroitin 500-400 MG tablet Take 1 tablet by mouth daily.      . hydrochlorothiazide (HYDRODIURIL) 25 MG tablet 1/2 tab by mouth per day 45 tablet 3  . indomethacin (INDOCIN) 50 MG capsule take 1 capsule by mouth three times a day if needed 90 capsule 1  . irbesartan (AVAPRO) 300 MG tablet Take 1 tablet (300 mg total) by mouth daily. 90 tablet 3  . naproxen (NAPROSYN) 500 MG tablet Take 1 tablet (500 mg total) by mouth 2 (two) times daily with a meal. 180 tablet 3  . pravastatin (PRAVACHOL) 40 MG tablet Take 1 tablet by mouth  daily 90 tablet 1  . sildenafil (VIAGRA) 100 MG tablet Take 1 tablet (100 mg total) by mouth as needed for erectile dysfunction. 10 tablet 11  . tamsulosin (FLOMAX) 0.4 MG CAPS capsule Take 1 capsule (0.4 mg total) by mouth daily. 90 capsule 3  . verapamil (CALAN-SR) 240 MG CR tablet Take 1 tablet by mouth at  bedtime 90 tablet 0  . vitamin C (ASCORBIC ACID)  500 MG tablet Take 500 mg by mouth daily.       No current facility-administered medications on file prior to visit.   Review of Systems  Constitutional: Negative for unusual diaphoresis or night sweats HENT: Negative for ear swelling or discharge Eyes: Negative for worsening visual haziness  Respiratory: Negative for choking and stridor.   Gastrointestinal: Negative for distension or worsening eructation Genitourinary: Negative for retention or change in urine volume.  Musculoskeletal: Negative for other MSK pain or swelling Skin: Negative for color change and worsening wound Neurological: Negative for tremors and numbness other than noted  Psychiatric/Behavioral: Negative for decreased concentration or agitation other than above       Objective:    Physical Exam BP 120/82 mmHg  Pulse 69  Temp(Src) 97.8 F (36.6 C) (Oral)  Resp 20  Wt 276 lb (125.193 kg)  SpO2 99% VS noted, not ill appearing Constitutional: Pt appears in no apparent distress HENT: Head: NCAT.  Right Ear: External ear normal.  Left Ear: External ear normal.  Eyes: . Pupils are equal, round, and reactive to light. Conjunctivae and EOM are normal Neck: Normal range of motion. Neck supple.  Cardiovascular: Normal rate and regular rhythm.   Pulmonary/Chest: Effort normal and breath sounds without rales or wheezing.  Neurological: Pt is alert. Not confused , motor grossly intact Skin: Skin is warm. No rash, no LE edema Psychiatric: Pt behavior is normal. No agitation.     Assessment & Plan:

## 2015-07-15 NOTE — Progress Notes (Signed)
Pre visit review using our clinic review tool, if applicable. No additional management support is needed unless otherwise documented below in the visit note. 

## 2015-07-15 NOTE — Assessment & Plan Note (Signed)
stable overall by history and exam, recent data reviewed with pt, and pt to continue medical treatment as before,  to f/u any worsening symptoms or concerns Lab Results  Component Value Date   LDLCALC 83 04/14/2015

## 2015-07-15 NOTE — Patient Instructions (Signed)
Please continue all other medications as before, and refills have been done if requested.  Please have the pharmacy call with any other refills you may need.  Please keep your appointments with your specialists as you may have planned  Have a good time in [place].

## 2015-07-15 NOTE — Assessment & Plan Note (Signed)
stable overall by history and exam, recent data reviewed with pt, and pt to continue medical treatment as before,  to f/u any worsening symptoms or concerns BP Readings from Last 3 Encounters:  07/15/15 120/82  04/30/15 120/86  04/23/15 132/78  ok to cont all meds in the sun

## 2015-08-02 DIAGNOSIS — G4733 Obstructive sleep apnea (adult) (pediatric): Secondary | ICD-10-CM | POA: Diagnosis not present

## 2015-08-09 DIAGNOSIS — G4733 Obstructive sleep apnea (adult) (pediatric): Secondary | ICD-10-CM | POA: Diagnosis not present

## 2015-08-29 ENCOUNTER — Other Ambulatory Visit: Payer: Self-pay | Admitting: Internal Medicine

## 2015-09-02 DIAGNOSIS — G4733 Obstructive sleep apnea (adult) (pediatric): Secondary | ICD-10-CM | POA: Diagnosis not present

## 2015-09-09 DIAGNOSIS — G4733 Obstructive sleep apnea (adult) (pediatric): Secondary | ICD-10-CM | POA: Diagnosis not present

## 2015-10-02 DIAGNOSIS — G4733 Obstructive sleep apnea (adult) (pediatric): Secondary | ICD-10-CM | POA: Diagnosis not present

## 2015-10-09 DIAGNOSIS — G4733 Obstructive sleep apnea (adult) (pediatric): Secondary | ICD-10-CM | POA: Diagnosis not present

## 2015-10-20 DIAGNOSIS — H5203 Hypermetropia, bilateral: Secondary | ICD-10-CM | POA: Diagnosis not present

## 2015-11-02 DIAGNOSIS — G4733 Obstructive sleep apnea (adult) (pediatric): Secondary | ICD-10-CM | POA: Diagnosis not present

## 2015-11-08 DIAGNOSIS — G4733 Obstructive sleep apnea (adult) (pediatric): Secondary | ICD-10-CM | POA: Diagnosis not present

## 2015-11-18 ENCOUNTER — Encounter: Payer: Self-pay | Admitting: Family Medicine

## 2015-11-22 NOTE — Progress Notes (Signed)
[name] [place], [place] [address] Phone: [phone] Subjective:    I'm seeing this patient by the request  of:  Cathlean Cower, MD   CC: Left shoulder pain  [id number]  Calvin [name] is a 65 y.o. male coming in with complaint of left shoulder pain. Past medical history is significant for rotator cuff tear. Patient fell out of the way back of a [employer] last week. Patient states fell directly onto the left shoulder. Patient states unfortunately he has had significant amount of pain and swelling and bruising immediately. Patient states since then has had decreasing pain and swelling but unfortunately continues to have difficulty with moving it. Unable to lift greater than 30. Denies any radiation down the arm. Patient states that it is waking him up at night. Anti-inflammatories have been helpful. Rates severity of pain a 7 out of 10     Past Medical History:  Diagnosis Date  . Diverticulosis   . Gout   . Hyperlipidemia   . Hypertension   . OSA (obstructive sleep apnea) 11/13/2013   Past Surgical History:  Procedure Laterality Date  . COLONOSCOPY    . POLYPECTOMY    . ROTATOR CUFF REPAIR     left  . THROAT SURGERY     polyps removed from vocal cord   Social History   Social History  . Marital status: Married    Spouse name: N/A  . Number of children: N/A  . Years of education: N/A   Social History Main Topics  . Smoking status: Former Smoker    Packs/day: 2.00    Years: 5.00    Types: Cigarettes    Quit date: 05/23/1977  . Smokeless tobacco: Never Used  . Alcohol use No     Comment: QUIT 2002  . Drug use: No     Comment: Completed program in april 2000  . Sexual activity: Not Asked   Other Topics Concern  . None   Social History Narrative  . None   No Known Allergies No family history on file. No family history rheumatological diseases.  Past medical history, social, surgical and family history all reviewed in  electronic medical record.  No pertanent information unless stated regarding to the chief complaint.   Review of Systems: No headache, visual changes, nausea, vomiting, diarrhea, constipation, dizziness, abdominal pain, skin rash, fevers, chills, night sweats, weight loss, swollen lymph nodes, body aches, joint swelling, muscle aches, chest pain, shortness of breath, mood changes.   Objective  Blood pressure 128/80, pulse 77, weight 279 lb (126.6 kg), SpO2 96 %.  General: No apparent distress alert and oriented x3 mood and affect normal, dressed appropriately.  HEENT: Pupils equal, extraocular movements intact  Respiratory: Patient's speak in full sentences and does not appear short of breath  Cardiovascular: No lower extremity edema, non tender, no erythema  Skin: Warm dry intact with no signs of infection or rash on extremities or on axial skeleton.  Abdomen: Soft nontender  Neuro: Cranial nerves II through XII are intact, neurovascularly intact in all extremities with 2+ DTRs and 2+ pulses.  Lymph: No lymphadenopathy of posterior or anterior cervical chain or axillae bilaterally.  Gait normal with good balance and coordination.  MSK:  Non tender with full range of motion and good stability and symmetric strength and tone of  elbows, wrist, hip, knee and ankles bilaterally.  Shoulder: Left Atrophy noted.. Palpation is normal with no tenderness over AC joint or bicipital  groove. Rotator cuff has 3-5 strength. signs of impingement with positive Neer and Hawkin's tests, but negative empty can sign. Speeds and Yergason's tests positive. painful arc and no drop arm sign. Contralateral shoulder unremarkable  MSK US performed of: left This study was ordered, performed, and interpreted by Charlann Boxer D.O.  Shoulder:   Supraspinatus: Large high-grade tear of the proximal he 75% of the articular surface. Mild retraction noted. Infraspinatus:  Appears normal on long and transverse views.  Significant increase in Doppler flow Subscapularis:  Intersubstance tear noted. Teres Minor:  Appears normal on long and transverse views. AC joint:  Mild arthritic changes Glenohumeral Joint:  Mild arthritis Glenoid Labrum: Degenerative changes noted Biceps Tendon:  Patient does have what appears to be a full-thickness tear noted. No significant retraction though.  Impression: High grade partial tear.       Impression and Recommendations:     This case required medical decision making of moderate complexity.      Note: This dictation was prepared with Dragon dictation along with smaller phrase technology. Any transcriptional errors that result from this process are unintentional.

## 2015-11-23 ENCOUNTER — Ambulatory Visit (INDEPENDENT_AMBULATORY_CARE_PROVIDER_SITE_OTHER): Payer: Medicare Other | Admitting: Family Medicine

## 2015-11-23 ENCOUNTER — Ambulatory Visit (INDEPENDENT_AMBULATORY_CARE_PROVIDER_SITE_OTHER)
Admission: RE | Admit: 2015-11-23 | Discharge: 2015-11-23 | Disposition: A | Discharge status: Home / Self Care | Payer: Medicare Other | Source: Ambulatory Visit | Attending: Family Medicine | Admitting: Family Medicine

## 2015-11-23 ENCOUNTER — Other Ambulatory Visit: Payer: Self-pay

## 2015-11-23 ENCOUNTER — Encounter: Payer: Self-pay | Admitting: Family Medicine

## 2015-11-23 VITALS — BP 128/80 | HR 77 | Wt 279.0 lb

## 2015-11-23 DIAGNOSIS — M25512 Pain in left shoulder: Secondary | ICD-10-CM

## 2015-11-23 DIAGNOSIS — M75102 Unspecified rotator cuff tear or rupture of left shoulder, not specified as traumatic: Secondary | ICD-10-CM | POA: Diagnosis not present

## 2015-11-23 NOTE — Patient Instructions (Signed)
Good to see you  I am concern with the rotator cuff but does not appear to be full thickness but with your history I do not want to lose any ground.  Lets get xray downstairs today and MRI is ordered.  I will write you when I get the results.  Depending on this we will see what is going on.  Now if you get better before the MRI then we can wait a little longer.  Ice 20 minutes 2 times daily. Usually after activity and before bed.

## 2015-11-23 NOTE — Assessment & Plan Note (Signed)
Patient does have a left rotator cuff tear. We discussed with patient that I do think that this could need surgical intervention. Patient unfortunately has had surgery on this side previously. I think x-rays are needed. With patient also been very active in the amount of weakness I do feel that advance imaging is warranted as well. Ultrasound shows some retraction already underwent a make sure that patient has a opportunity to have surgical intervention if necessary. Patient then will come back after the MRI and we'll discuss further evaluation and treatment.

## 2015-11-30 ENCOUNTER — Ambulatory Visit
Admission: RE | Admit: 2015-11-30 | Discharge: 2015-11-30 | Disposition: A | Discharge status: Home / Self Care | Payer: Medicare Other | Source: Ambulatory Visit | Attending: Family Medicine | Admitting: Family Medicine

## 2015-11-30 DIAGNOSIS — M25512 Pain in left shoulder: Secondary | ICD-10-CM

## 2015-11-30 DIAGNOSIS — S46012A Strain of muscle(s) and tendon(s) of the rotator cuff of left shoulder, initial encounter: Secondary | ICD-10-CM | POA: Diagnosis not present

## 2015-12-01 ENCOUNTER — Encounter: Payer: Self-pay | Admitting: Family Medicine

## 2015-12-03 DIAGNOSIS — G4733 Obstructive sleep apnea (adult) (pediatric): Secondary | ICD-10-CM | POA: Diagnosis not present

## 2015-12-08 DIAGNOSIS — G4733 Obstructive sleep apnea (adult) (pediatric): Secondary | ICD-10-CM | POA: Diagnosis not present

## 2015-12-14 DIAGNOSIS — S46012A Strain of muscle(s) and tendon(s) of the rotator cuff of left shoulder, initial encounter: Secondary | ICD-10-CM | POA: Diagnosis not present

## 2015-12-14 DIAGNOSIS — S4982XA Other specified injuries of left shoulder and upper arm, initial encounter: Secondary | ICD-10-CM | POA: Diagnosis not present

## 2015-12-21 DIAGNOSIS — R972 Elevated prostate specific antigen [PSA]: Secondary | ICD-10-CM | POA: Diagnosis not present

## 2015-12-27 DIAGNOSIS — R972 Elevated prostate specific antigen [PSA]: Secondary | ICD-10-CM | POA: Diagnosis not present

## 2015-12-27 DIAGNOSIS — N400 Enlarged prostate without lower urinary tract symptoms: Secondary | ICD-10-CM | POA: Diagnosis not present

## 2015-12-31 ENCOUNTER — Encounter: Payer: Self-pay | Admitting: Internal Medicine

## 2015-12-31 DIAGNOSIS — D649 Anemia, unspecified: Secondary | ICD-10-CM

## 2016-01-02 DIAGNOSIS — G4733 Obstructive sleep apnea (adult) (pediatric): Secondary | ICD-10-CM | POA: Diagnosis not present

## 2016-01-03 ENCOUNTER — Other Ambulatory Visit (INDEPENDENT_AMBULATORY_CARE_PROVIDER_SITE_OTHER): Payer: Medicare Other

## 2016-01-03 ENCOUNTER — Ambulatory Visit (INDEPENDENT_AMBULATORY_CARE_PROVIDER_SITE_OTHER): Payer: Medicare Other

## 2016-01-03 DIAGNOSIS — D649 Anemia, unspecified: Secondary | ICD-10-CM

## 2016-01-03 DIAGNOSIS — Z23 Encounter for immunization: Secondary | ICD-10-CM | POA: Diagnosis not present

## 2016-01-03 LAB — CBC WITH DIFFERENTIAL/PLATELET
BASOS PCT: 0.4 % (ref 0.0–3.0)
Basophils Absolute: 0 10*3/uL (ref 0.0–0.1)
EOS ABS: 0.1 10*3/uL (ref 0.0–0.7)
Eosinophils Relative: 2.1 % (ref 0.0–5.0)
HCT: 35 % — ABNORMAL LOW (ref 39.0–52.0)
Hemoglobin: 11.6 g/dL — ABNORMAL LOW (ref 13.0–17.0)
Lymphocytes Relative: 42 % (ref 12.0–46.0)
Lymphs Abs: 1.5 10*3/uL (ref 0.7–4.0)
MCHC: 33 g/dL (ref 30.0–36.0)
MCV: 81.1 fl (ref 78.0–100.0)
MONO ABS: 0.2 10*3/uL (ref 0.1–1.0)
Monocytes Relative: 6.6 % (ref 3.0–12.0)
NEUTROS ABS: 1.8 10*3/uL (ref 1.4–7.7)
NEUTROS PCT: 48.9 % (ref 43.0–77.0)
PLATELETS: 171 10*3/uL (ref 150.0–400.0)
RBC: 4.31 Mil/uL (ref 4.22–5.81)
RDW: 20.4 % — AB (ref 11.5–15.5)
WBC: 3.7 10*3/uL — ABNORMAL LOW (ref 4.0–10.5)

## 2016-01-03 LAB — IBC PANEL
IRON: 64 ug/dL (ref 42–165)
Saturation Ratios: 14.9 % — ABNORMAL LOW (ref 20.0–50.0)
TRANSFERRIN: 306 mg/dL (ref 212.0–360.0)

## 2016-01-03 LAB — VITAMIN B12: Vitamin B-12: 185 pg/mL — ABNORMAL LOW (ref 211–911)

## 2016-01-03 NOTE — Progress Notes (Signed)
Pre visit review using our clinic review tool, if applicable. No additional management support is needed unless otherwise documented below in the visit note. 

## 2016-01-05 ENCOUNTER — Ambulatory Visit (INDEPENDENT_AMBULATORY_CARE_PROVIDER_SITE_OTHER): Payer: Medicare Other

## 2016-01-05 DIAGNOSIS — E538 Deficiency of other specified B group vitamins: Secondary | ICD-10-CM | POA: Diagnosis not present

## 2016-01-05 MED ORDER — CYANOCOBALAMIN 1000 MCG/ML IJ SOLN
1000.0000 ug | Freq: Once | INTRAMUSCULAR | Status: AC
  Administered 2016-01-05: 1000 ug via INTRAMUSCULAR

## 2016-01-07 DIAGNOSIS — G4733 Obstructive sleep apnea (adult) (pediatric): Secondary | ICD-10-CM | POA: Diagnosis not present

## 2016-01-10 ENCOUNTER — Other Ambulatory Visit: Payer: Self-pay | Admitting: Internal Medicine

## 2016-01-31 ENCOUNTER — Encounter: Payer: Self-pay | Admitting: Internal Medicine

## 2016-02-02 DIAGNOSIS — G4733 Obstructive sleep apnea (adult) (pediatric): Secondary | ICD-10-CM | POA: Diagnosis not present

## 2016-02-07 DIAGNOSIS — G4733 Obstructive sleep apnea (adult) (pediatric): Secondary | ICD-10-CM | POA: Diagnosis not present

## 2016-03-03 DIAGNOSIS — G4733 Obstructive sleep apnea (adult) (pediatric): Secondary | ICD-10-CM | POA: Diagnosis not present

## 2016-03-07 DIAGNOSIS — G4733 Obstructive sleep apnea (adult) (pediatric): Secondary | ICD-10-CM | POA: Diagnosis not present

## 2016-03-21 ENCOUNTER — Other Ambulatory Visit: Payer: Self-pay | Admitting: *Deleted

## 2016-03-21 MED ORDER — SILDENAFIL CITRATE 100 MG PO TABS: 100.0000 mg | ORAL_TABLET | ORAL | 0 refills | Status: DC | PRN

## 2016-03-21 NOTE — Telephone Encounter (Signed)
Left msg on triage pt requesting refill on his Viagra. Sent electronically...Johny Chess

## 2016-04-03 ENCOUNTER — Encounter: Payer: Self-pay | Admitting: Internal Medicine

## 2016-04-03 ENCOUNTER — Ambulatory Visit (AMBULATORY_SURGERY_CENTER): Payer: Self-pay

## 2016-04-03 VITALS — Ht 68.5 in | Wt 278.8 lb

## 2016-04-03 DIAGNOSIS — Z860100 Personal history of colon polyps, unspecified: Secondary | ICD-10-CM

## 2016-04-03 DIAGNOSIS — G4733 Obstructive sleep apnea (adult) (pediatric): Secondary | ICD-10-CM | POA: Diagnosis not present

## 2016-04-03 MED ORDER — SUPREP BOWEL PREP KIT 17.5-3.13-1.6 GM/177ML PO SOLN: 1.0000 | Freq: Once | ORAL | 0 refills | Status: AC

## 2016-04-03 NOTE — Progress Notes (Signed)
No allergies to eggs or soy No diet meds No past problems with anesthesia  Declined emmi 

## 2016-04-06 DIAGNOSIS — H43811 Vitreous degeneration, right eye: Secondary | ICD-10-CM | POA: Diagnosis not present

## 2016-04-10 ENCOUNTER — Telehealth: Payer: Self-pay | Admitting: Internal Medicine

## 2016-04-10 MED ORDER — NA SULFATE-K SULFATE-MG SULF 17.5-3.13-1.6 GM/177ML PO SOLN: 1.0000 | Freq: Once | ORAL | 0 refills | Status: AC

## 2016-04-10 NOTE — Telephone Encounter (Signed)
Sent Suprep to [employer] [facility]

## 2016-04-13 ENCOUNTER — Ambulatory Visit (AMBULATORY_SURGERY_CENTER): Payer: Medicare Other | Admitting: Internal Medicine

## 2016-04-13 ENCOUNTER — Encounter: Payer: Self-pay | Admitting: Internal Medicine

## 2016-04-13 VITALS — BP 148/88 | HR 60 | Temp 96.2°F | Resp 16 | Ht 68.0 in | Wt 278.0 lb

## 2016-04-13 DIAGNOSIS — K621 Rectal polyp: Secondary | ICD-10-CM | POA: Diagnosis not present

## 2016-04-13 DIAGNOSIS — D128 Benign neoplasm of rectum: Secondary | ICD-10-CM

## 2016-04-13 DIAGNOSIS — K635 Polyp of colon: Secondary | ICD-10-CM | POA: Diagnosis not present

## 2016-04-13 DIAGNOSIS — Z86010 Personal history of colonic polyps: Secondary | ICD-10-CM

## 2016-04-13 DIAGNOSIS — Z1211 Encounter for screening for malignant neoplasm of colon: Secondary | ICD-10-CM | POA: Diagnosis not present

## 2016-04-13 DIAGNOSIS — D123 Benign neoplasm of transverse colon: Secondary | ICD-10-CM

## 2016-04-13 MED ORDER — SODIUM CHLORIDE 0.9 % IV SOLN: 500.0000 mL | INTRAVENOUS | Status: DC

## 2016-04-13 NOTE — Patient Instructions (Signed)
YOU HAD AN ENDOSCOPIC PROCEDURE TODAY AT THE [facility]:   Refer to the procedure report that was given to you for any specific questions about what was found during the examination.  If the procedure report does not answer your questions, please call your gastroenterologist to clarify.  If you requested that your care partner not be given the details of your procedure findings, then the procedure report has been included in a sealed envelope for you to review at your convenience later.  YOU SHOULD EXPECT: Some feelings of bloating in the abdomen. Passage of more gas than usual.  Walking can help get rid of the air that was put into your GI tract during the procedure and reduce the bloating. If you had a lower endoscopy (such as a colonoscopy or flexible sigmoidoscopy) you may notice spotting of blood in your stool or on the toilet paper. If you underwent a bowel prep for your procedure, you may not have a normal bowel movement for a few days.  Please Note:  You might notice some irritation and congestion in your nose or some drainage.  This is from the oxygen used during your procedure.  There is no need for concern and it should clear up in a day or so.  SYMPTOMS TO REPORT IMMEDIATELY:   Following lower endoscopy (colonoscopy or flexible sigmoidoscopy):  Excessive amounts of blood in the stool  Significant tenderness or worsening of abdominal pains  Swelling of the abdomen that is new, acute  Fever of 100F or higher   For urgent or emergent issues, a gastroenterologist can be reached at any hour by calling [phone].   DIET:  We do recommend a small meal at first, but then you may proceed to your regular diet.  Drink plenty of fluids but you should avoid alcoholic beverages for 24 hours.  ACTIVITY:  You should plan to take it easy for the rest of today and you should NOT DRIVE or use heavy machinery until tomorrow (because of the sedation medicines used during the test).     FOLLOW UP: Our staff will call the number listed on your records the next business day following your procedure to check on you and address any questions or concerns that you may have regarding the information given to you following your procedure. If we do not reach you, we will leave a message.  However, if you are feeling well and you are not experiencing any problems, there is no need to return our call.  We will assume that you have returned to your regular daily activities without incident.  If any biopsies were taken you will be contacted by phone or by letter within the next 1-3 weeks.  Please call us at [phone] if you have not heard about the biopsies in 3 weeks.    SIGNATURES/CONFIDENTIALITY: You and/or your care partner have signed paperwork which will be entered into your electronic medical record.  These signatures attest to the fact that that the information above on your After Visit Summary has been reviewed and is understood.  Full responsibility of the confidentiality of this discharge information lies with you and/or your care-partner.   Resume medications. Information given on polyps,diverticulosis and hemorrhoids. 

## 2016-04-13 NOTE — Op Note (Signed)
[facility] Patient Name: Calvin [name] Procedure Date: 04/13/2016 8:50 AM MRN: [id number] Endoscopist: Docia Chuck. Henrene Pastor , MD Age: 66 Referring MD:  Date of Birth: Oct 12, 1950 Gender: Male Account #: [id number] Procedure:                Colonoscopy, with cold snare polypectomy x 2 Indications:              High risk colon cancer surveillance: Personal                            history of non-advanced adenoma. Index examination                            1999 with tubular adenoma. Follow-up examinations                            2002, 2007, and 2012 negative for neoplasia Medicines:                Monitored Anesthesia Care Procedure:                Pre-Anesthesia Assessment:                           - Prior to the procedure, a History and Physical                            was performed, and patient medications and                            allergies were reviewed. The patient's tolerance of                            previous anesthesia was also reviewed. The risks                            and benefits of the procedure and the sedation                            options and risks were discussed with the patient.                            All questions were answered, and informed consent                            was obtained. Prior Anticoagulants: The patient has                            taken no previous anticoagulant or antiplatelet                            agents. ASA Grade Assessment: II - A patient with                            mild systemic disease. After reviewing the risks  and benefits, the patient was deemed in                            satisfactory condition to undergo the procedure.                           After obtaining informed consent, the colonoscope                            was passed under direct vision. Throughout the                            procedure, the patient's blood pressure, pulse, and          oxygen saturations were monitored continuously. The                            Model CF-HQ190L [id number]) scope was introduced                            through the anus and advanced to the the cecum,                            identified by appendiceal orifice and ileocecal                            valve. The ileocecal valve, appendiceal orifice,                            and rectum were photographed. The quality of the                            bowel preparation was excellent. The colonoscopy                            was performed without difficulty. The patient                            tolerated the procedure well. The bowel preparation                            used was SUPREP. Scope In: 9:14:19 AM Scope Out: 9:29:14 AM Scope Withdrawal Time: 0 hours 11 minutes 43 seconds  Total Procedure Duration: 0 hours 14 minutes 55 seconds  Findings:                 Two polyps were found in the rectum and ascending                            colon. The polyps were 1 to 3 mm in size. These                            polyps were removed with a cold snare. Resection  and retrieval were complete.                           Multiple small and large-mouthed diverticula were                            found in the entire colon.                           Internal hemorrhoids were found during retroflexion.                           The exam was otherwise without abnormality on                            direct and retroflexion views. Complications:            No immediate complications. Estimated blood loss:                            None. Estimated Blood Loss:     Estimated blood loss: none. Impression:               - Two 1 to 3 mm polyps in the rectum and in the                            ascending colon, removed with a cold snare.                            Resected and retrieved.                           - Diverticulosis in the entire examined colon.                            - Internal hemorrhoids.                           - The examination was otherwise normal on direct                            and retroflexion views. Recommendation:           - Repeat colonoscopy in 5-10 years for surveillance.                           - Patient has a contact number available for                            emergencies. The signs and symptoms of potential                            delayed complications were discussed with the                            patient. Return to normal activities tomorrow.  Written discharge instructions were provided to the                            patient.                           - Resume previous diet.                           - Continue present medications.                           - Await pathology results. Docia Chuck. Henrene Pastor, MD 04/13/2016 9:37:51 AM This report has been signed electronically.

## 2016-04-13 NOTE — Progress Notes (Signed)
Patient awakening,vss,report to rn 

## 2016-04-13 NOTE — Progress Notes (Signed)
Called to room to assist during endoscopic procedure.  Patient ID and intended procedure confirmed with present staff. Received instructions for my participation in the procedure from the performing physician.  

## 2016-04-14 ENCOUNTER — Telehealth: Payer: Self-pay

## 2016-04-14 NOTE — Telephone Encounter (Signed)
  Follow up Call-  Call back number 04/13/2016  Post procedure Call Back phone  # [phone]  Permission to leave phone message Yes  Some recent data might be hidden     Patient questions:  Do you have a fever, pain , or abdominal swelling? No. Pain Score  0 *  Have you tolerated food without any problems? Yes.    Have you been able to return to your normal activities? Yes.    Do you have any questions about your discharge instructions: Diet   No. Medications  No. Follow up visit  No.  Do you have questions or concerns about your Care? No.  Actions: * If pain score is 4 or above: No action needed, pain <4.

## 2016-04-19 ENCOUNTER — Encounter: Payer: Self-pay | Admitting: Internal Medicine

## 2016-04-26 ENCOUNTER — Encounter: Payer: Medicare Other | Admitting: Internal Medicine

## 2016-04-26 ENCOUNTER — Other Ambulatory Visit (INDEPENDENT_AMBULATORY_CARE_PROVIDER_SITE_OTHER): Payer: Medicare Other

## 2016-04-26 DIAGNOSIS — R7989 Other specified abnormal findings of blood chemistry: Secondary | ICD-10-CM

## 2016-04-26 DIAGNOSIS — Z0000 Encounter for general adult medical examination without abnormal findings: Secondary | ICD-10-CM | POA: Diagnosis not present

## 2016-04-26 LAB — CBC WITH DIFFERENTIAL/PLATELET
BASOS ABS: 0 10*3/uL (ref 0.0–0.1)
Basophils Relative: 0.3 % (ref 0.0–3.0)
EOS ABS: 0.1 10*3/uL (ref 0.0–0.7)
Eosinophils Relative: 3 % (ref 0.0–5.0)
HEMATOCRIT: 39.3 % (ref 39.0–52.0)
Hemoglobin: 13.2 g/dL (ref 13.0–17.0)
LYMPHS PCT: 38.4 % (ref 12.0–46.0)
Lymphs Abs: 1.6 10*3/uL (ref 0.7–4.0)
MCHC: 33.6 g/dL (ref 30.0–36.0)
MCV: 87.1 fl (ref 78.0–100.0)
MONOS PCT: 6.9 % (ref 3.0–12.0)
Monocytes Absolute: 0.3 10*3/uL (ref 0.1–1.0)
NEUTROS ABS: 2.2 10*3/uL (ref 1.4–7.7)
NEUTROS PCT: 51.4 % (ref 43.0–77.0)
PLATELETS: 155 10*3/uL (ref 150.0–400.0)
RBC: 4.52 Mil/uL (ref 4.22–5.81)
RDW: 15.8 % — ABNORMAL HIGH (ref 11.5–15.5)
WBC: 4.2 10*3/uL (ref 4.0–10.5)

## 2016-04-26 LAB — HEPATIC FUNCTION PANEL
ALBUMIN: 4.3 g/dL (ref 3.5–5.2)
ALT: 12 U/L (ref 0–53)
AST: 14 U/L (ref 0–37)
Alkaline Phosphatase: 71 U/L (ref 39–117)
Bilirubin, Direct: 0.1 mg/dL (ref 0.0–0.3)
TOTAL PROTEIN: 7.1 g/dL (ref 6.0–8.3)
Total Bilirubin: 0.5 mg/dL (ref 0.2–1.2)

## 2016-04-26 LAB — TSH: TSH: 0.82 u[IU]/mL (ref 0.35–4.50)

## 2016-04-26 LAB — BASIC METABOLIC PANEL
BUN: 17 mg/dL (ref 6–23)
CALCIUM: 8.9 mg/dL (ref 8.4–10.5)
CHLORIDE: 108 meq/L (ref 96–112)
CO2: 30 meq/L (ref 19–32)
CREATININE: 1.31 mg/dL (ref 0.40–1.50)
GFR: 70.04 mL/min (ref >60.00)
GLUCOSE: 120 mg/dL — AB (ref 70–99)
Potassium: 3.8 mEq/L (ref 3.5–5.1)
Sodium: 142 mEq/L (ref 135–145)

## 2016-04-26 LAB — LIPID PANEL
CHOLESTEROL: 122 mg/dL (ref 0–200)
HDL: 22.1 mg/dL — AB (ref >39.00)
NonHDL: 100.36
TRIGLYCERIDES: 225 mg/dL — AB (ref 0.0–149.0)
Total CHOL/HDL Ratio: 6
VLDL: 45 mg/dL — ABNORMAL HIGH (ref 0.0–40.0)

## 2016-04-26 LAB — URINALYSIS, ROUTINE W REFLEX MICROSCOPIC
BILIRUBIN URINE: NEGATIVE
Hgb urine dipstick: NEGATIVE
KETONES UR: NEGATIVE
LEUKOCYTES UA: NEGATIVE
Nitrite: NEGATIVE
PH: 6 (ref 5.0–8.0)
RBC / HPF: NONE SEEN (ref 0–?)
Specific Gravity, Urine: 1.015 (ref 1.000–1.030)
TOTAL PROTEIN, URINE-UPE24: NEGATIVE
UROBILINOGEN UA: 0.2 (ref 0.0–1.0)
Urine Glucose: NEGATIVE
WBC, UA: NONE SEEN (ref 0–?)

## 2016-04-26 LAB — PSA: PSA: 1.66 ng/mL (ref 0.10–4.00)

## 2016-04-26 LAB — LDL CHOLESTEROL, DIRECT: Direct LDL: 62 mg/dL

## 2016-05-02 ENCOUNTER — Ambulatory Visit (INDEPENDENT_AMBULATORY_CARE_PROVIDER_SITE_OTHER): Payer: Medicare Other | Admitting: Pulmonary Disease

## 2016-05-02 ENCOUNTER — Encounter: Payer: Self-pay | Admitting: Pulmonary Disease

## 2016-05-02 VITALS — BP 112/78 | HR 70 | Ht 69.0 in | Wt 273.8 lb

## 2016-05-02 DIAGNOSIS — Z6841 Body Mass Index (BMI) 40.0 and over, adult: Secondary | ICD-10-CM

## 2016-05-02 DIAGNOSIS — G4733 Obstructive sleep apnea (adult) (pediatric): Secondary | ICD-10-CM | POA: Diagnosis not present

## 2016-05-02 NOTE — Patient Instructions (Signed)
Follow up in 1 year.

## 2016-05-02 NOTE — Progress Notes (Signed)
Current Outpatient Prescriptions on File Prior to Visit  Medication Sig  . allopurinol (ZYLOPRIM) 100 MG tablet Take 2 tablets (200 mg total) by mouth daily.  Marland Kitchen aspirin 81 MG tablet Take 81 mg by mouth daily.    . colchicine 0.6 MG tablet take 1 tablet by mouth once daily  . cyclobenzaprine (FLEXERIL) 5 MG tablet Take 1 tablet (5 mg total) by mouth 3 (three) times daily as needed for muscle spasms.  . finasteride (PROSCAR) 5 MG tablet Take 1 tablet (5 mg total) by mouth daily.  . fluticasone (FLONASE) 50 MCG/ACT nasal spray Place 2 sprays into the nose daily. (Patient taking differently: Place 2 sprays into the nose daily as needed. )  . glucosamine-chondroitin 500-400 MG tablet Take 1 tablet by mouth daily.    . hydrochlorothiazide (HYDRODIURIL) 25 MG tablet TAKE ONE-HALF TABLET BY  MOUTH EVERY DAY  . indomethacin (INDOCIN) 50 MG capsule take 1 capsule by mouth three times a day if needed  . irbesartan (AVAPRO) 300 MG tablet Take 1 tablet (300 mg total) by mouth daily.  . naproxen (NAPROSYN) 500 MG tablet Take 1 tablet (500 mg total) by mouth 2 (two) times daily with a meal.  . NON FORMULARY 1,000 mg daily.  . pravastatin (PRAVACHOL) 40 MG tablet Take 1 tablet by mouth  daily  . sildenafil (VIAGRA) 100 MG tablet Take 1 tablet (100 mg total) by mouth as needed for erectile dysfunction. Yearly physical w/labs due in February must see MD for refills  . tamsulosin (FLOMAX) 0.4 MG CAPS capsule TAKE 1 CAPSULE BY MOUTH  DAILY  . verapamil (CALAN-SR) 240 MG CR tablet Take 1 tablet by mouth at  bedtime  . vitamin C (ASCORBIC ACID) 500 MG tablet Take 500 mg by mouth daily.     Current Facility-Administered Medications on File Prior to Visit  Medication  . 0.9 %  sodium chloride infusion     Chief Complaint  Patient presents with  . Follow-up    Wears CPAP nightly. Denies problems with mask/pressure. DME: [facility]     Sleep tests HST 12/29/13 >> AHI 62.8, SaO2 low 71%. Auto CPAP 02/02/16 to  05/02/15 >> used on 90 of 90 nights with average 7 hrs 10 min.  Average AHI 2.6 with median CPAP 8 and 95 th percentile CPAP 14 cm H2O  Past medical hisotry HTN. HLD, Gout, Diverticulosis  Past surgical history, Family history, Social history, Allergies reviewed  Vital Signs BP 112/78 (BP Location: Left Arm, Cuff Size: Normal)   Pulse 70   Ht 5\' 9"  (1.753 m)   Wt 273 lb 12.8 oz (124.2 kg)   SpO2 98%   BMI 40.43 kg/m   History of Present Illness Calvin [name] is a 66 y.o. male with OSA.  He is doing well with CPAP.  Uses every night.  Has full face mask.  Gets occasional mouth dryness.  Asked about CPAP cleaner.  Physical Exam  General - pleasant ENT - MP 4, enlarged tongue, no oral exudate, no sinus tenderness, no LAN Cardiac - regular, no murmur Chest - no wheeze/rales Back - no tenderness Abd - soft, non tender Ext - no edema Neuro - normal strength Skin - no rashes Psych - normal mood  Assessment/Plan  Obstructive sleep apnea. - he is compliant with therapy and reports benefit from CPAP - discussed the role of distilled water, and how to adjust humidifier settings - reviewed +/- of CPAP cleaners, and expense involved - continue auto CPAP  Obesity. - discussed importance of weight loss   Patient Instructions  Follow up in 1 year   [name], MD [facility] Pager:  [phone] 05/02/2016, 10:39 AM

## 2016-05-04 ENCOUNTER — Other Ambulatory Visit: Payer: Self-pay | Admitting: Internal Medicine

## 2016-05-04 ENCOUNTER — Ambulatory Visit (INDEPENDENT_AMBULATORY_CARE_PROVIDER_SITE_OTHER): Payer: Medicare Other | Admitting: Internal Medicine

## 2016-05-04 ENCOUNTER — Encounter: Payer: Self-pay | Admitting: Internal Medicine

## 2016-05-04 VITALS — BP 128/72 | HR 78 | Temp 97.7°F | Ht 69.0 in | Wt 274.0 lb

## 2016-05-04 DIAGNOSIS — I10 Essential (primary) hypertension: Secondary | ICD-10-CM | POA: Diagnosis not present

## 2016-05-04 DIAGNOSIS — R748 Abnormal levels of other serum enzymes: Secondary | ICD-10-CM | POA: Insufficient documentation

## 2016-05-04 DIAGNOSIS — Z0001 Encounter for general adult medical examination with abnormal findings: Secondary | ICD-10-CM

## 2016-05-04 DIAGNOSIS — R739 Hyperglycemia, unspecified: Secondary | ICD-10-CM | POA: Diagnosis not present

## 2016-05-04 DIAGNOSIS — E781 Pure hyperglyceridemia: Secondary | ICD-10-CM | POA: Insufficient documentation

## 2016-05-04 DIAGNOSIS — Z0000 Encounter for general adult medical examination without abnormal findings: Secondary | ICD-10-CM | POA: Diagnosis not present

## 2016-05-04 DIAGNOSIS — R001 Bradycardia, unspecified: Secondary | ICD-10-CM | POA: Insufficient documentation

## 2016-05-04 LAB — POCT GLYCOSYLATED HEMOGLOBIN (HGB A1C): HEMOGLOBIN A1C: 5.4

## 2016-05-04 MED ORDER — IRBESARTAN 300 MG PO TABS: 300.0000 mg | ORAL_TABLET | Freq: Every day | ORAL | 3 refills | Status: DC

## 2016-05-04 MED ORDER — NAPROXEN 500 MG PO TABS: 500.0000 mg | ORAL_TABLET | Freq: Two times a day (BID) | ORAL | 3 refills | Status: DC

## 2016-05-04 MED ORDER — ALLOPURINOL 100 MG PO TABS: 200.0000 mg | ORAL_TABLET | Freq: Every day | ORAL | 3 refills | Status: DC

## 2016-05-04 MED ORDER — TAMSULOSIN HCL 0.4 MG PO CAPS: 0.4000 mg | ORAL_CAPSULE | Freq: Every day | ORAL | 1 refills | Status: DC

## 2016-05-04 MED ORDER — VERAPAMIL HCL ER 240 MG PO TBCR: 240.0000 mg | EXTENDED_RELEASE_TABLET | Freq: Every day | ORAL | 2 refills | Status: DC

## 2016-05-04 MED ORDER — PRAVASTATIN SODIUM 40 MG PO TABS
40.0000 mg | ORAL_TABLET | Freq: Every day | ORAL | 2 refills | Status: DC
Start: 2016-05-04 — End: 2016-05-17

## 2016-05-04 MED ORDER — FINASTERIDE 5 MG PO TABS
5.0000 mg | ORAL_TABLET | Freq: Every day | ORAL | 3 refills | Status: DC
Start: 2016-05-04 — End: 2018-05-09

## 2016-05-04 NOTE — Patient Instructions (Signed)
Your A1c was OK today  Please continue all other medications as before, and refills have been done if requested.  Please have the pharmacy call with any other refills you may need.  Please continue your efforts at being more active, low cholesterol diet, and weight control.  You are otherwise up to date with prevention measures today.  Please keep your appointments with your specialists as you may have planned  Please return in 1 year for your yearly visit, or sooner if needed, with Lab testing done 3-5 days before  

## 2016-05-04 NOTE — Addendum Note (Signed)
Addended by: Elta Guadeloupe on: 05/04/2016 09:28 AM   Modules accepted: Orders

## 2016-05-04 NOTE — Assessment & Plan Note (Signed)
a1c stable, to cont to work on diet, wt loss efforts

## 2016-05-04 NOTE — Assessment & Plan Note (Signed)
Asympt, possibly related to verapamil, declines med change for now

## 2016-05-04 NOTE — Assessment & Plan Note (Signed)

## 2016-05-04 NOTE — Assessment & Plan Note (Signed)
Mild worsening, for wt loss, low fat diet

## 2016-05-04 NOTE — Assessment & Plan Note (Signed)
stable overall by history and exam, recent data reviewed with pt, and pt to continue medical treatment as before,  to f/u any worsening symptoms or concerns BP Readings from Last 3 Encounters:  05/04/16 128/72  05/02/16 112/78  04/13/16 (!) 148/88

## 2016-05-04 NOTE — Progress Notes (Signed)
Subjective:    Patient ID: Calvin [name], male    DOB: 1950-10-09, 66 y.o.   MRN: [id number]  HPI    Here for wellness and f/u;  Overall doing ok;  Pt denies Chest pain, worsening SOB, DOE, wheezing, orthopnea, PND, worsening LE edema, palpitations, dizziness or syncope.  Pt denies neurological change such as new headache, facial or extremity weakness.  Pt denies polydipsia, polyuria, or low sugar symptoms. Pt states overall good compliance with treatment and medications, good tolerability, but has not been trying to follow appropriate diet.  Pt denies worsening depressive symptoms, suicidal ideation or panic. No fever, night sweats, wt loss, loss of appetite, or other constitutional symptoms.  Pt states good ability with ADL's, has low fall risk, home safety reviewed and adequate, no other significant changes in hearing or vision, and not active with exercise, but plans to start more walking.  Plans to start clairitin and flonase with his yearly bike trip to [place], always starts this during the season.  BP Readings from Last 3 Encounters:  05/04/16 128/72  05/02/16 112/78  04/13/16 (!) 148/88   Wt Readings from Last 3 Encounters:  05/04/16 274 lb (124.3 kg)  05/02/16 273 lb 12.8 oz (124.2 kg)  04/13/16 278 lb (126.1 kg)  Pt admits to not following diet, has had increased fat and no increased acitivity despite past recommendations. Past Medical History:  Diagnosis Date  . Allergy   . Arthritis   . Diverticulosis   . Gout   . Hyperlipidemia   . Hypertension   . OSA (obstructive sleep apnea) 11/13/2013  . Sickle cell trait ([place])   . Sleep apnea    Past Surgical History:  Procedure Laterality Date  . COLONOSCOPY    . POLYPECTOMY    . ROTATOR CUFF REPAIR     left  . THROAT SURGERY     polyps removed from vocal cord    reports that he quit smoking about 38 years ago. His smoking use included Cigarettes. He has a 10.00 pack-year smoking history. He has never used smokeless tobacco. He  reports that he does not drink alcohol or use drugs. family history is not on file. No Known Allergies Current Outpatient Prescriptions on File Prior to Visit  Medication Sig Dispense Refill  . allopurinol (ZYLOPRIM) 100 MG tablet Take 2 tablets (200 mg total) by mouth daily. 180 tablet 3  . aspirin 81 MG tablet Take 81 mg by mouth daily.      . colchicine 0.6 MG tablet take 1 tablet by mouth once daily 90 tablet 3  . cyclobenzaprine (FLEXERIL) 5 MG tablet Take 1 tablet (5 mg total) by mouth 3 (three) times daily as needed for muscle spasms. 60 tablet 1  . finasteride (PROSCAR) 5 MG tablet Take 1 tablet (5 mg total) by mouth daily. 90 tablet 3  . fluticasone (FLONASE) 50 MCG/ACT nasal spray Place 2 sprays into the nose daily. (Patient taking differently: Place 2 sprays into the nose daily as needed. ) 48 g 3  . glucosamine-chondroitin 500-400 MG tablet Take 1 tablet by mouth daily.      . hydrochlorothiazide (HYDRODIURIL) 25 MG tablet TAKE ONE-HALF TABLET BY  MOUTH EVERY DAY 45 tablet 1  . indomethacin (INDOCIN) 50 MG capsule take 1 capsule by mouth three times a day if needed 90 capsule 1  . irbesartan (AVAPRO) 300 MG tablet Take 1 tablet (300 mg total) by mouth daily. 90 tablet 3  . naproxen (NAPROSYN) 500 MG tablet  Take 1 tablet (500 mg total) by mouth 2 (two) times daily with a meal. 180 tablet 3  . NON FORMULARY 1,000 mg daily.    . pravastatin (PRAVACHOL) 40 MG tablet Take 1 tablet by mouth  daily 90 tablet 2  . sildenafil (VIAGRA) 100 MG tablet Take 1 tablet (100 mg total) by mouth as needed for erectile dysfunction. Yearly physical w/labs due in February must see MD for refills 10 tablet 0  . tamsulosin (FLOMAX) 0.4 MG CAPS capsule TAKE 1 CAPSULE BY MOUTH  DAILY 90 capsule 1  . verapamil (CALAN-SR) 240 MG CR tablet Take 1 tablet by mouth at  bedtime 90 tablet 2  . vitamin C (ASCORBIC ACID) 500 MG tablet Take 500 mg by mouth daily.       No current facility-administered medications on  file prior to visit.    Review of Systems Constitutional: Negative for increased diaphoresis, or other activity, appetite or siginficant weight change other than noted HENT: Negative for worsening hearing loss, ear pain, facial swelling, mouth sores and neck stiffness.   Eyes: Negative for other worsening pain, redness or visual disturbance.  Respiratory: Negative for choking or stridor Cardiovascular: Negative for other chest pain and palpitations.  Gastrointestinal: Negative for worsening diarrhea, blood in stool, or abdominal distention Genitourinary: Negative for hematuria, flank pain or change in urine volume.  Musculoskeletal: Negative for myalgias or other joint complaints.  Skin: Negative for other color change and wound or drainage.  Neurological: Negative for syncope and numbness. other than noted Hematological: Negative for adenopathy. or other swelling Psychiatric/Behavioral: Negative for hallucinations, SI, self-injury, decreased concentration or other worsening agitation.  All other system neg per pt    Objective:   Physical Exam BP 128/72   Pulse 78   Temp 97.7 F (36.5 C)   Ht 5\' 9"  (1.753 m)   Wt 274 lb (124.3 kg)   SpO2 97%   BMI 40.46 kg/m  VS noted, obese Constitutional: Pt is oriented to person, place, and time. Appears well-developed and well-nourished, in no significant distress Head: Normocephalic and atraumatic  Eyes: Conjunctivae and EOM are normal. Pupils are equal, round, and reactive to light Right Ear: External ear normal.  Left Ear: External ear normal Nose: Nose normal.  Mouth/Throat: Oropharynx is clear and moist  Neck: Normal range of motion. Neck supple. No JVD present. No tracheal deviation present or significant neck LA or mass Cardiovascular: Normal rate, regular rhythm, normal heart sounds and intact distal pulses.   Pulmonary/Chest: Effort normal and breath sounds without rales or wheezing  Abdominal: Soft. Bowel sounds are normal. NT. No  HSM  Musculoskeletal: Normal range of motion. Exhibits no edema, has some crepitus left knee, NT, no effusion Lymphadenopathy: Has no cervical adenopathy.  Neurological: Pt is alert and oriented to person, place, and time. Pt has normal reflexes. No cranial nerve deficit. Motor grossly intact Skin: Skin is warm and dry. No rash noted or new ulcers Psychiatric:  Has normal mood and affect. Behavior is normal.  No other new exam findings  Lab Results  Component Value Date   WBC 4.2 04/26/2016   HGB 13.2 04/26/2016   HCT 39.3 04/26/2016   PLT 155.0 04/26/2016   GLUCOSE 120 (H) 04/26/2016   CHOL 122 04/26/2016   TRIG 225.0 (H) 04/26/2016   HDL 22.10 (L) 04/26/2016   LDLDIRECT 62.0 04/26/2016   LDLCALC 83 04/14/2015   ALT 12 04/26/2016   AST 14 04/26/2016   NA 142 04/26/2016  K 3.8 04/26/2016   CL 108 04/26/2016   CREATININE 1.31 04/26/2016   BUN 17 04/26/2016   CO2 30 04/26/2016   TSH 0.82 04/26/2016   PSA 1.66 04/26/2016   INR 1.0 03/03/2008   HGBA1C 5.8 08/29/2012   POCT - A1c - 5.4  ECG today I have personally interpreted:  Sinus bradycardia 48     Assessment & Plan:

## 2016-05-04 NOTE — Assessment & Plan Note (Signed)
Urged pt to start exercise with walking daily 20 -30 min

## 2016-05-04 NOTE — Telephone Encounter (Signed)
Refilled the medication as follow: send to [facility] Allopurinol Finasteride HZTC irbersartan  Naproxen  Pravastatin tamsulosin Verapamil

## 2016-05-08 DIAGNOSIS — G4733 Obstructive sleep apnea (adult) (pediatric): Secondary | ICD-10-CM | POA: Diagnosis not present

## 2016-05-17 ENCOUNTER — Other Ambulatory Visit: Payer: Self-pay | Admitting: Internal Medicine

## 2016-07-10 DIAGNOSIS — B351 Tinea unguium: Secondary | ICD-10-CM | POA: Diagnosis not present

## 2016-08-08 DIAGNOSIS — G4733 Obstructive sleep apnea (adult) (pediatric): Secondary | ICD-10-CM | POA: Diagnosis not present

## 2016-08-16 ENCOUNTER — Other Ambulatory Visit: Payer: Self-pay | Admitting: Internal Medicine

## 2016-09-04 ENCOUNTER — Other Ambulatory Visit: Payer: Self-pay | Admitting: Internal Medicine

## 2016-09-04 NOTE — Progress Notes (Signed)
Pre visit review using our clinic review tool, if applicable. No additional management support is needed unless otherwise documented below in the visit note. 

## 2016-09-04 NOTE — Progress Notes (Addendum)
Subjective:   Calvin [name] is a 66 y.o. male who presents for an Initial Medicare Annual Wellness Visit.  Review of Systems  Physical assessment deferred to PCP.    Sleep patterns: feels rested on waking, gets up 2 times nightly to void and sleeps 7-8 hours nightly.     Home Safety/Smoke Alarms: Feels safe in home. Smoke alarms in place.  Living environment; residence and Firearm Safety: 2-story house, no firearms.Lives with family, no DME needed Seat Belt Safety/Bike Helmet: Wears seat belt.   Counseling:   Eye Exam- appointment yearly Dental- appointment every 6 months   Male:   [facility]- last 04/13/16, benign polyps, recall 10 years     PSA-  Lab Results  Component Value Date   PSA 1.66 04/26/2016   PSA 1.88 04/14/2015   PSA 2.32 04/16/2014      Objective:    There were no vitals filed for this visit. There is no height or weight on file to calculate BMI.  Current Medications (verified) Outpatient Encounter Prescriptions as of 09/06/2016  Medication Sig  . allopurinol (ZYLOPRIM) 100 MG tablet Take 2 tablets (200 mg total) by mouth daily.  Marland Kitchen allopurinol (ZYLOPRIM) 100 MG tablet TAKE 2 TABLETS BY MOUTH  DAILY  . aspirin 81 MG tablet Take 81 mg by mouth daily.    . colchicine 0.6 MG tablet take 1 tablet by mouth once daily  . cyanocobalamin 1000 MCG tablet Take 1,000 mcg by mouth daily.  . cyclobenzaprine (FLEXERIL) 5 MG tablet Take 1 tablet (5 mg total) by mouth 3 (three) times daily as needed for muscle spasms.  . finasteride (PROSCAR) 5 MG tablet Take 1 tablet (5 mg total) by mouth daily.  . fluticasone (FLONASE) 50 MCG/ACT nasal spray Place 2 sprays into the nose daily. (Patient taking differently: Place 2 sprays into the nose daily as needed. )  . glucosamine-chondroitin 500-400 MG tablet Take 1 tablet by mouth daily.    . hydrochlorothiazide (HYDRODIURIL) 25 MG tablet TAKE ONE-HALF TABLET BY  MOUTH EVERY DAY  . indomethacin (INDOCIN) 50 MG capsule take 1 capsule by  mouth three times a day if needed  . irbesartan (AVAPRO) 300 MG tablet Take 1 tablet (300 mg total) by mouth daily.  . irbesartan (AVAPRO) 300 MG tablet TAKE 1 TABLET BY MOUTH  DAILY  . naproxen (NAPROSYN) 500 MG tablet Take 1 tablet (500 mg total) by mouth 2 (two) times daily with a meal.  . NON FORMULARY 1,000 mg daily.  . pravastatin (PRAVACHOL) 40 MG tablet TAKE 1 TABLET BY MOUTH  DAILY  . sildenafil (VIAGRA) 100 MG tablet Take 1 tablet (100 mg total) by mouth as needed for erectile dysfunction. Yearly physical w/labs due in February must see MD for refills  . tamsulosin (FLOMAX) 0.4 MG CAPS capsule Take 1 capsule (0.4 mg total) by mouth daily.  . tamsulosin (FLOMAX) 0.4 MG CAPS capsule TAKE 1 CAPSULE BY MOUTH  DAILY  . verapamil (CALAN-SR) 240 MG CR tablet Take 1 tablet (240 mg total) by mouth at bedtime.  . verapamil (CALAN-SR) 240 MG CR tablet TAKE 1 TABLET BY MOUTH AT  BEDTIME  . vitamin C (ASCORBIC ACID) 500 MG tablet Take 500 mg by mouth daily.     No facility-administered encounter medications on file as of 09/06/2016.     Allergies (verified) Patient has no known allergies.   History: Past Medical History:  Diagnosis Date  . Allergy   . Arthritis   . Diverticulosis   .  Gout   . Hyperlipidemia   . Hypertension   . OSA (obstructive sleep apnea) 11/13/2013  . Sickle cell trait ([place])   . Sleep apnea    Past Surgical History:  Procedure Laterality Date  . COLONOSCOPY    . POLYPECTOMY    . ROTATOR CUFF REPAIR     left  . THROAT SURGERY     polyps removed from vocal cord   Family History  Problem Relation Age of Onset  . Colon cancer Neg Hx    Social History   Occupational History  . Not on file.   Social History Main Topics  . Smoking status: Former Smoker    Packs/day: 2.00    Years: 5.00    Types: Cigarettes    Quit date: 05/23/1977  . Smokeless tobacco: Never Used  . Alcohol use No     Comment: QUIT 2002  . Drug use: No     Comment: Completed program  in april 2000  . Sexual activity: Not on file   Tobacco Counseling Counseling given: Not Answered   Activities of Daily Living No flowsheet data found.  Immunizations and Health Maintenance Immunization History  Administered Date(s) Administered  . H1N1 03/30/2008  . Influenza Split 01/18/2012, 11/11/2013  . Influenza Whole 01/14/2007, 01/11/2009  . Influenza, High Dose Seasonal PF 01/03/2016  . Influenza-Unspecified 12/18/2012, 02/02/2015  . Pneumococcal Conjugate-13 08/27/2013  . Pneumococcal Polysaccharide-23 12/10/2013  . Td 10/19/2007  . Zoster 04/01/2009  at There are no preventive care reminders to display for this patient.  Patient Care Team: Biagio Borg, MD as PCP - General  Indicate any recent Medical Services you may have received from other than [facility] providers in the past year (date may be approximate).    Assessment:   This is a routine wellness examination for Calvin [name]. Physical assessment deferred to PCP.   Hearing/Vision screen No exam data present  Dietary issues and exercise activities discussed:   Diet (meal preparation, eat out, water intake, caffeinated beverages, dairy products, fruits and vegetables): in general, a "healthy" diet  , low fat/ cholesterol, low salt  Reviewed heart healthy and low carbohydrate diet, Diet education was attached to patient's AVS.   Goals    None     Depression Screen PHQ 2/9 Scores 05/04/2016 04/23/2015 04/23/2015 04/22/2014  PHQ - 2 Score 0 0 0 0    Fall Risk Fall Risk  05/04/2016 04/23/2015 04/23/2015 04/22/2014 04/22/2014  Falls in the past year? Yes No No No No  Number falls in past yr: 1 - - - -  Injury with Fall? Yes - - - -    Cognitive Function:       Ad8 score reviewed for issues:  Issues making decisions: no  Less interest in hobbies / activities: no  Repeats questions, stories (family complaining): no  Trouble using ordinary gadgets (microwave, computer, phone):no  Forgets the month or year:  no  Mismanaging finances: no  Remembering appts: no  Daily problems with thinking and/or memory: no Ad8 score is= 0 Screening Tests Health Maintenance  Topic Date Due  . INFLUENZA VACCINE  10/11/2016  . TETANUS/TDAP  10/18/2017  . COLONOSCOPY  04/13/2026  . Hepatitis C Screening  Completed  . PNA vac Low Risk Adult  Completed        Plan:     Continue doing brain stimulating activities (puzzles, reading, adult coloring books, staying active) to keep memory sharp.   Continue to eat heart healthy diet (full of fruits,  vegetables, whole grains, lean protein, water--limit salt, fat, and sugar intake) and increase physical activity as tolerated.  I have personally reviewed and noted the following in the patient's chart:   . Medical and social history . Use of alcohol, tobacco or illicit drugs  . Current medications and supplements . Functional ability and status . Nutritional status . Physical activity . Advanced directives . List of other physicians . Vitals . Screenings to include cognitive, depression, and falls . Referrals and appointments  In addition, I have reviewed and discussed with patient certain preventive protocols, quality metrics, and best practice recommendations. A written personalized care plan for preventive services as well as general preventive health recommendations were provided to patient.     Michiel Cowboy, RN   09/04/2016   Medical screening examination/treatment/procedure(s) were performed by non-physician practitioner and as supervising physician I was immediately available for consultation/collaboration. I agree with above. Cathlean Cower, MD

## 2016-09-06 ENCOUNTER — Ambulatory Visit (INDEPENDENT_AMBULATORY_CARE_PROVIDER_SITE_OTHER): Payer: Medicare Other | Admitting: *Deleted

## 2016-09-06 VITALS — BP 142/85 | HR 60 | Resp 20 | Ht 69.0 in | Wt 281.0 lb

## 2016-09-06 DIAGNOSIS — Z0000 Encounter for general adult medical examination without abnormal findings: Secondary | ICD-10-CM | POA: Diagnosis not present

## 2016-09-06 DIAGNOSIS — G4733 Obstructive sleep apnea (adult) (pediatric): Secondary | ICD-10-CM | POA: Diagnosis not present

## 2016-09-06 NOTE — Patient Instructions (Addendum)
Continue doing brain stimulating activities (puzzles, reading, adult coloring books, staying active) to keep memory sharp.   Continue to eat heart healthy diet (full of fruits, vegetables, whole grains, lean protein, water--limit salt, fat, and sugar intake) and increase physical activity as tolerated.   Calvin [name] , Thank you for taking time to come for your Medicare Wellness Visit. I appreciate your ongoing commitment to your health goals. Please review the following plan we discussed and let me know if I can assist you in the future.   These are the goals we discussed: Goals    . Start to exercise on a regular basis and lose weight          I will start to go the gym 3 times per week, reduce dessert intake to 3 times a week.       This is a list of the screening recommended for you and due dates:  Health Maintenance  Topic Date Due  . Flu Shot  10/11/2016  . Tetanus Vaccine  10/18/2017  . Colon Cancer Screening  04/13/2026  .  Hepatitis C: One time screening is recommended by Center for Disease Control  (CDC) for  adults born from [birth year] through 1967.   Completed  . Pneumonia vaccines  Completed     Reading Food Labels Foods that are packaged or in containers have a Nutrition Facts panel on the side or back. This is commonly called the food label. The food label helps you make healthy food choices by providing information about serving size and the amount of calories and various nutrients in the food. You can check the food label to find out if the food contains high or low amounts of nutrients that you want to limit in your diet. You can also use the food label to see if the food is a good source of the nutrients that you want to make sure are included in your diet. How do I read the food label?  Begin by checking the serving size and number of servings in the container. All of the nutrition information listed on the food label is based on one serving. If you eat more than one  serving, you must multiply the amounts (such as calories, grams of saturated fat, or milligrams of sodium) by the number of servings.  Check the calories. Choosing foods that are low in calories can help you manage your weight.  Look at the numbers in the % Daily Value column for each listed nutrient. This gives you an idea of how much of the daily recommended amount for that nutrient is provided in one serving of the food. A daily value of 5% or less is considered low. A daily value of 20% or more is considered high.  Check the amounts for the items you should limit in your diet. These include: ? Total fat. ? Saturated fat. ? Trans fat. ? Cholesterol. ? Sodium.  Check the amounts for the items you should make sure you get enough of. These include: ? Dietary fiber. ? Vitamins A and C. ? Calcium. ? Iron. What information is provided on the food label? Serving information  Serving size. ? The serving size is listed in cups or pieces. The nutrient amounts listed on the food label apply to this amount of the food.  Servings per container or package. ? This shows the number of servings you can expect to get from the container or package if you follow the suggested serving size. Amount  per serving  Calories. ? The number of calories in one serving of the food. This information is helpful in managing weight. Low-calorie foods contain 40 calories or less. High-calorie foods contain 400 or more calories.  Calories from fat. ? The number of calories that come from fat in one serving. Percent daily value Percent daily value (shown on the label as % Daily Value) tells you what percent of the daily value for each nutrient one serving provides. The daily value is the recommended amount of the nutrient that you should get each day. For example, if 15% is listed next to dietary fiber, it means that one serving of the food will give you 15% of the recommended amount of fiber that you should get in a  day. The daily values are based on a 2,000-calorie-per-day diet. You may get more or less than 2,000 calories in your diet each day, but the % Daily Value gives you an idea of whether the food contains a high or low amount of the listed nutrient. A daily value of 5% or less is low. A daily value of 20% or more is high. Total fat Total fat shows you the total amount of fat in one serving (listed in grams). Foods with high amounts of fat usually have higher calories and may lead to weight gain. Two of the fats that make up a portion of the total fat are included on the label:  Saturated fat. ? This number is the amount of saturated fat in one serving (listed in grams). Saturated fat increases the amount of blood cholesterol and should be limited to less than 7% of total calories each day. This means that if you eat 2,000 calories each day, you should eat less than 140 calories from saturated fat.    Cholesterol The amount of cholesterol in one serving is listed in milligrams. Cholesterol should be limited to no more than 300 mg each day. Sodium The amount of sodium in one serving is listed in milligrams. Most people should limit their sodium intake to 2,300 mg a day. Total carbohydrate This number shows the amount of total carbohydrate in one serving (listed in grams). This information can help people with diabetes manage the amount of carbohydrate they eat. Two of the carbohydrates that make up a portion of the total carbohydrate are included on the label:  Dietary fiber. ? The amount of dietary fiber in one serving is listed in grams. Most people should eat at least 25 g of dietary fiber each day.  Sugars. ? The amount of sugar in one serving is also listed in grams. This value includes both naturally occurring sugars from fruit and milk and added sugars such as honey or table sugar.  Protein The amount of protein in one serving is listed in grams. What other important labeling is on the food  package? Ingredients Food labels will list each ingredient in the food. The first ingredient listed is the ingredient that the food has the most of. The ingredients are listed in the order of their amount by weight from highest to lowest. Food allergen labeling Food labels may also include a food allergen warning. Listed here are ingredients that can cause allergic reactions in some people. The potential allergens are listed behind the word "Contains" or "May contain." Examples of ingredients that may be listed are wheat, dairy, eggs, soy, and nuts. If a person knows that he or she is allergic to one of these ingredients, he or she  will know to avoid that food. Where to find more information:  [place] Food and Drug Administration: [employer] This information is not intended to replace advice given to you by your health care provider. Make sure you discuss any questions you have with your health care provider. Document Released: 02/27/2005 Document Revised: 10/27/2015 Document Reviewed: 01/20/2013 Elsevier Interactive Patient Education  2017 [employer] for Diabetes Mellitus, Adult Carbohydrate counting is a method for keeping track of how many carbohydrates you eat. Eating carbohydrates naturally increases the amount of sugar (glucose) in the blood. Counting how many carbohydrates you eat helps keep your blood glucose within normal limits, which helps you manage your diabetes (diabetes mellitus). It is important to know how many carbohydrates you can safely have in each meal. This is different for every person. A diet and nutrition specialist (registered dietitian) can help you make a meal plan and calculate how many carbohydrates you should have at each meal and snack. Carbohydrates are found in the following foods:  Grains, such as breads and cereals.  Dried beans and soy products.  Starchy vegetables, such as potatoes, peas, and corn.  Fruit and fruit juices.  Milk  and yogurt.  Sweets and snack foods, such as cake, cookies, candy, chips, and soft drinks.  How do I count carbohydrates? There are two ways to count carbohydrates in food. You can use either of the methods or a combination of both. Reading "Nutrition Facts" on packaged food The "Nutrition Facts" list is included on the labels of almost all packaged foods and beverages in the [place] It includes:  The serving size.  Information about nutrients in each serving, including the grams (g) of carbohydrate per serving.  To use the "Nutrition Facts":  Decide how many servings you will have.  Multiply the number of servings by the number of carbohydrates per serving.  The resulting number is the total amount of carbohydrates that you will be having.  Learning standard serving sizes of other foods When you eat foods containing carbohydrates that are not packaged or do not include "Nutrition Facts" on the label, you need to measure the servings in order to count the amount of carbohydrates:  Measure the foods that you will eat with a food scale or measuring cup, if needed.  Decide how many standard-size servings you will eat.  Multiply the number of servings by 15. Most carbohydrate-rich foods have about 15 g of carbohydrates per serving. ? For example, if you eat 8 oz (170 g) of strawberries, you will have eaten 2 servings and 30 g of carbohydrates (2 servings x 15 g = 30 g).  For foods that have more than one food mixed, such as soups and casseroles, you must count the carbohydrates in each food that is included.  The following list contains standard serving sizes of common carbohydrate-rich foods. Each of these servings has about 15 g of carbohydrates:   hamburger bun or  English muffin.   oz (15 mL) syrup.   oz (14 g) jelly.  1 slice of bread.  1 six-inch tortilla.  3 oz (85 g) cooked rice or pasta.  4 oz (113 g) cooked dried beans.  4 oz (113 g) starchy vegetable, such as  peas, corn, or potatoes.  4 oz (113 g) hot cereal.  4 oz (113 g) mashed potatoes or  of a large baked potato.  4 oz (113 g) canned or frozen fruit.  4 oz (120 mL) fruit juice.  4-6 crackers.  6 chicken nuggets.  6 oz (170 g) unsweetened dry cereal.  6 oz (170 g) plain fat-free yogurt or yogurt sweetened with artificial sweeteners.  8 oz (240 mL) milk.  8 oz (170 g) fresh fruit or one small piece of fruit.  24 oz (680 g) popped popcorn.  Example of carbohydrate counting Sample meal  3 oz (85 g) chicken breast.  6 oz (170 g) brown rice.  4 oz (113 g) corn.  8 oz (240 mL) milk.  8 oz (170 g) strawberries with sugar-free whipped topping. Carbohydrate calculation 1. Identify the foods that contain carbohydrates: ? Rice. ? Corn. ? Milk. ? Strawberries. 2. Calculate how many servings you have of each food: ? 2 servings rice. ? 1 serving corn. ? 1 serving milk. ? 1 serving strawberries. 3. Multiply each number of servings by 15 g: ? 2 servings rice x 15 g = 30 g. ? 1 serving corn x 15 g = 15 g. ? 1 serving milk x 15 g = 15 g. ? 1 serving strawberries x 15 g = 15 g. 4. Add together all of the amounts to find the total grams of carbohydrates eaten: ? 30 g + 15 g + 15 g + 15 g = 75 g of carbohydrates total. This information is not intended to replace advice given to you by your health care provider. Make sure you discuss any questions you have with your health care provider. Document Released: 02/27/2005 Document Revised: 09/17/2015 Document Reviewed: 08/11/2015 Elsevier Interactive Patient Education  [employer].

## 2016-10-24 ENCOUNTER — Other Ambulatory Visit: Payer: Self-pay | Admitting: Internal Medicine

## 2016-11-06 DIAGNOSIS — G4733 Obstructive sleep apnea (adult) (pediatric): Secondary | ICD-10-CM | POA: Diagnosis not present

## 2016-12-22 DIAGNOSIS — R972 Elevated prostate specific antigen [PSA]: Secondary | ICD-10-CM | POA: Diagnosis not present

## 2016-12-28 ENCOUNTER — Other Ambulatory Visit: Payer: Self-pay | Admitting: Internal Medicine

## 2016-12-29 DIAGNOSIS — R972 Elevated prostate specific antigen [PSA]: Secondary | ICD-10-CM | POA: Diagnosis not present

## 2016-12-29 DIAGNOSIS — N401 Enlarged prostate with lower urinary tract symptoms: Secondary | ICD-10-CM | POA: Diagnosis not present

## 2017-02-05 DIAGNOSIS — G4733 Obstructive sleep apnea (adult) (pediatric): Secondary | ICD-10-CM | POA: Diagnosis not present

## 2017-02-08 DIAGNOSIS — H35371 Puckering of macula, right eye: Secondary | ICD-10-CM | POA: Diagnosis not present

## 2017-02-08 DIAGNOSIS — H5203 Hypermetropia, bilateral: Secondary | ICD-10-CM | POA: Diagnosis not present

## 2017-02-12 ENCOUNTER — Encounter: Payer: Self-pay | Admitting: Internal Medicine

## 2017-02-12 ENCOUNTER — Other Ambulatory Visit: Payer: Self-pay | Admitting: Internal Medicine

## 2017-02-12 MED ORDER — INDOMETHACIN 50 MG PO CAPS: ORAL_CAPSULE | ORAL | 1 refills | Status: DC

## 2017-02-14 ENCOUNTER — Ambulatory Visit: Payer: Medicare Other | Admitting: Family Medicine

## 2017-03-07 DIAGNOSIS — G4733 Obstructive sleep apnea (adult) (pediatric): Secondary | ICD-10-CM | POA: Diagnosis not present

## 2017-04-21 ENCOUNTER — Other Ambulatory Visit: Payer: Self-pay | Admitting: Internal Medicine

## 2017-04-30 ENCOUNTER — Other Ambulatory Visit (INDEPENDENT_AMBULATORY_CARE_PROVIDER_SITE_OTHER): Payer: Medicare Other

## 2017-04-30 DIAGNOSIS — R739 Hyperglycemia, unspecified: Secondary | ICD-10-CM | POA: Diagnosis not present

## 2017-04-30 DIAGNOSIS — Z0001 Encounter for general adult medical examination with abnormal findings: Secondary | ICD-10-CM | POA: Diagnosis not present

## 2017-04-30 LAB — BASIC METABOLIC PANEL
BUN: 23 mg/dL (ref 6–23)
CALCIUM: 8.6 mg/dL (ref 8.4–10.5)
CO2: 27 meq/L (ref 19–32)
CREATININE: 1.31 mg/dL (ref 0.40–1.50)
Chloride: 104 mEq/L (ref 96–112)
GFR: 69.83 mL/min (ref >60.00)
GLUCOSE: 107 mg/dL — AB (ref 70–99)
Potassium: 3.6 mEq/L (ref 3.5–5.1)
Sodium: 140 mEq/L (ref 135–145)

## 2017-04-30 LAB — HEPATIC FUNCTION PANEL
ALK PHOS: 64 U/L (ref 39–117)
ALT: 16 U/L (ref 0–53)
AST: 20 U/L (ref 0–37)
Albumin: 4.4 g/dL (ref 3.5–5.2)
BILIRUBIN DIRECT: 0.2 mg/dL (ref 0.0–0.3)
BILIRUBIN TOTAL: 0.7 mg/dL (ref 0.2–1.2)
Total Protein: 7.2 g/dL (ref 6.0–8.3)

## 2017-04-30 LAB — CBC WITH DIFFERENTIAL/PLATELET
BASOS ABS: 0 10*3/uL (ref 0.0–0.1)
Basophils Relative: 0.7 % (ref 0.0–3.0)
EOS PCT: 2.1 % (ref 0.0–5.0)
Eosinophils Absolute: 0.1 10*3/uL (ref 0.0–0.7)
HCT: 38.7 % — ABNORMAL LOW (ref 39.0–52.0)
HEMOGLOBIN: 13.1 g/dL (ref 13.0–17.0)
Lymphocytes Relative: 41.5 % (ref 12.0–46.0)
Lymphs Abs: 1.6 10*3/uL (ref 0.7–4.0)
MCHC: 33.9 g/dL (ref 30.0–36.0)
MCV: 90.2 fl (ref 78.0–100.0)
Monocytes Absolute: 0.2 10*3/uL (ref 0.1–1.0)
Monocytes Relative: 6.3 % (ref 3.0–12.0)
Neutro Abs: 1.9 10*3/uL (ref 1.4–7.7)
Neutrophils Relative %: 49.4 % (ref 43.0–77.0)
Platelets: 131 10*3/uL — ABNORMAL LOW (ref 150.0–400.0)
RBC: 4.29 Mil/uL (ref 4.22–5.81)
RDW: 14 % (ref 11.5–15.5)
WBC: 3.9 10*3/uL — AB (ref 4.0–10.5)

## 2017-04-30 LAB — LIPID PANEL
CHOL/HDL RATIO: 5
Cholesterol: 100 mg/dL (ref 0–200)
HDL: 18.1 mg/dL — AB (ref >39.00)
LDL Cholesterol: 50 mg/dL (ref 0–99)
NONHDL: 81.44
Triglycerides: 155 mg/dL — ABNORMAL HIGH (ref 0.0–149.0)
VLDL: 31 mg/dL (ref 0.0–40.0)

## 2017-04-30 LAB — TSH: TSH: 0.8 u[IU]/mL (ref 0.35–4.50)

## 2017-04-30 LAB — URINALYSIS, ROUTINE W REFLEX MICROSCOPIC
Bilirubin Urine: NEGATIVE
Hgb urine dipstick: NEGATIVE
Ketones, ur: NEGATIVE
Leukocytes, UA: NEGATIVE
NITRITE: NEGATIVE
PH: 6 (ref 5.0–8.0)
RBC / HPF: NONE SEEN (ref 0–?)
Specific Gravity, Urine: 1.015 (ref 1.000–1.030)
Total Protein, Urine: NEGATIVE
Urine Glucose: NEGATIVE
Urobilinogen, UA: 0.2 (ref 0.0–1.0)
WBC UA: NONE SEEN (ref 0–?)

## 2017-04-30 LAB — PSA: PSA: 1.95 ng/mL (ref 0.10–4.00)

## 2017-04-30 LAB — HEMOGLOBIN A1C: HEMOGLOBIN A1C: 5.5 % (ref 4.6–6.5)

## 2017-05-07 DIAGNOSIS — G4733 Obstructive sleep apnea (adult) (pediatric): Secondary | ICD-10-CM | POA: Diagnosis not present

## 2017-05-08 ENCOUNTER — Ambulatory Visit (INDEPENDENT_AMBULATORY_CARE_PROVIDER_SITE_OTHER): Payer: Medicare Other | Admitting: Internal Medicine

## 2017-05-08 ENCOUNTER — Encounter: Payer: Self-pay | Admitting: Internal Medicine

## 2017-05-08 ENCOUNTER — Encounter: Payer: Medicare Other | Admitting: Internal Medicine

## 2017-05-08 VITALS — BP 122/80 | HR 74 | Temp 97.6°F | Ht 69.0 in | Wt 279.0 lb

## 2017-05-08 DIAGNOSIS — R739 Hyperglycemia, unspecified: Secondary | ICD-10-CM

## 2017-05-08 DIAGNOSIS — Z0001 Encounter for general adult medical examination with abnormal findings: Secondary | ICD-10-CM | POA: Diagnosis not present

## 2017-05-08 DIAGNOSIS — R21 Rash and other nonspecific skin eruption: Secondary | ICD-10-CM | POA: Insufficient documentation

## 2017-05-08 DIAGNOSIS — Z0000 Encounter for general adult medical examination without abnormal findings: Secondary | ICD-10-CM

## 2017-05-08 MED ORDER — CLOTRIMAZOLE-BETAMETHASONE 1-0.05 % EX CREA: TOPICAL_CREAM | CUTANEOUS | 1 refills | Status: DC

## 2017-05-08 NOTE — Progress Notes (Signed)
Subjective:    Patient ID: Calvin [name], male    DOB: 1950/07/31, 67 y.o.   MRN: [id number]  HPI  Here for wellness and f/u;  Overall doing ok;  Pt denies Chest pain, worsening SOB, DOE, wheezing, orthopnea, PND, worsening LE edema, palpitations, dizziness or syncope.  Pt denies neurological change such as new headache, facial or extremity weakness.  Pt denies polydipsia, polyuria, or low sugar symptoms. Pt states overall good compliance with treatment and medications, good tolerability, and has been trying to follow appropriate diet.  Pt denies worsening depressive symptoms, suicidal ideation or panic. No fever, night sweats, wt loss, loss of appetite, or other constitutional symptoms.  Pt states good ability with ADL's, has low fall risk, home safety reviewed and adequate, no other significant changes in hearing or vision, and only occasionally active with exercise. Has f/u with Dr Halford Chessman in about 1 mo for OSA, pt plans to ask about getting PFT's  Retired now, plans to attend the [employer] at [place] this spring.  Has a small rash to bilateral pinna with itching.  No other interval hx or new complaints Past Medical History:  Diagnosis Date  . Allergy   . Arthritis   . Diverticulosis   . Gout   . Hyperlipidemia   . Hypertension   . OSA (obstructive sleep apnea) 11/13/2013  . Sickle cell trait ([place])   . Sleep apnea    Past Surgical History:  Procedure Laterality Date  . COLONOSCOPY    . POLYPECTOMY    . ROTATOR CUFF REPAIR     left  . THROAT SURGERY     polyps removed from vocal cord    reports that he quit smoking about 39 years ago. His smoking use included cigarettes. He has a 10.00 pack-year smoking history. he has never used smokeless tobacco. He reports that he does not drink alcohol or use drugs. family history is not on file. No Known Allergies Current Outpatient Medications on File Prior to Visit  Medication Sig Dispense Refill  . allopurinol (ZYLOPRIM) 100 MG tablet Take  2 tablets (200 mg total) by mouth daily. 180 tablet 3  . aspirin 81 MG tablet Take 81 mg by mouth daily.      . colchicine 0.6 MG tablet take 1 tablet by mouth once daily 90 tablet 0  . cyanocobalamin 1000 MCG tablet Take 1,000 mcg by mouth daily.    . finasteride (PROSCAR) 5 MG tablet Take 1 tablet (5 mg total) by mouth daily. 90 tablet 3  . fluticasone (FLONASE) 50 MCG/ACT nasal spray Place 2 sprays into the nose daily. 48 g 3  . glucosamine-chondroitin 500-400 MG tablet Take 1 tablet by mouth daily.      . hydrochlorothiazide (HYDRODIURIL) 25 MG tablet TAKE ONE-HALF TABLET BY  MOUTH EVERY DAY 45 tablet 0  . indomethacin (INDOCIN) 50 MG capsule 1 tab by mouth three times per day as needed 90 capsule 1  . irbesartan (AVAPRO) 300 MG tablet Take 1 tablet (300 mg total) by mouth daily. 90 tablet 3  . loratadine (CLARITIN) 10 MG tablet Take 10 mg by mouth daily as needed for allergies.    . naproxen (NAPROSYN) 500 MG tablet Take 1 tablet (500 mg total) by mouth 2 (two) times daily with a meal. 180 tablet 3  . NON FORMULARY 1,000 mg daily.    . pravastatin (PRAVACHOL) 40 MG tablet TAKE 1 TABLET BY MOUTH  DAILY 90 tablet 0  . sildenafil (VIAGRA) 100 MG  tablet Take 1 tablet (100 mg total) by mouth as needed for erectile dysfunction. Yearly physical w/labs due in February must see MD for refills 10 tablet 0  . tamsulosin (FLOMAX) 0.4 MG CAPS capsule Take 1 capsule (0.4 mg total) by mouth daily. 90 capsule 1  . verapamil (CALAN-SR) 240 MG CR tablet Take 1 tablet (240 mg total) by mouth at bedtime. 90 tablet 2   No current facility-administered medications on file prior to visit.     Review of Systems Constitutional: Negative for other unusual diaphoresis, sweats, appetite or weight changes HENT: Negative for other worsening hearing loss, ear pain, facial swelling, mouth sores or neck stiffness.   Eyes: Negative for other worsening pain, redness or other visual disturbance.  Respiratory: Negative for  other stridor or swelling Cardiovascular: Negative for other palpitations or other chest pain  Gastrointestinal: Negative for worsening diarrhea or loose stools, blood in stool, distention or other pain Genitourinary: Negative for hematuria, flank pain or other change in urine volume.  Musculoskeletal: Negative for myalgias or other joint swelling.  Skin: Negative for other color change, or other wound or worsening drainage.  Neurological: Negative for other syncope or numbness. Hematological: Negative for other adenopathy or swelling Psychiatric/Behavioral: Negative for hallucinations, other worsening agitation, SI, self-injury, or new decreased concentration All other system neg per pt    Objective:   Physical Exam BP 122/80   Pulse 74   Temp 97.6 F (36.4 C) (Oral)   Ht 5\' 9"  (1.753 m)   Wt 279 lb (126.6 kg)   SpO2 98%   BMI 41.20 kg/m  VS noted,  Constitutional: Pt is oriented to person, place, and time. Appears well-developed and well-nourished, in no significant distress and comfortable Head: Normocephalic and atraumatic  Eyes: Conjunctivae and EOM are normal. Pupils are equal, round, and reactive to light Right Ear: External ear normal without discharge Left Ear: External ear normal without discharge Nose: Nose without discharge or deformity Mouth/Throat: Oropharynx is without other ulcerations and moist  Neck: Normal range of motion. Neck supple. No JVD present. No tracheal deviation present or significant neck LA or mass Cardiovascular: Normal rate, regular rhythm, normal heart sounds and intact distal pulses.   Pulmonary/Chest: WOB normal and breath sounds without rales or wheezing  Abdominal: Soft. Bowel sounds are normal. NT. No HSM  Musculoskeletal: Normal range of motion. Exhibits no edema Lymphadenopathy: Has no other cervical adenopathy.  Neurological: Pt is alert and oriented to person, place, and time. Pt has normal reflexes. No cranial nerve deficit. Motor  grossly intact, Gait intact Skin: Skin is warm and dry. + scaly rash to bilat pinna, o/w none noted or new ulcerations Psychiatric:  Has normal mood and affect. Behavior is normal without agitation No other exam findings    Assessment & Plan:

## 2017-05-08 NOTE — Patient Instructions (Addendum)
Please take all new medication as prescribed - the cream for the ears (sent to [facility])  Please continue all other medications as before, and refills have been done if requested.  Please have the pharmacy call with any other refills you may need.  Please continue your efforts at being more active, low cholesterol diet, and weight control.  You are otherwise up to date with prevention measures today.  Please keep your appointments with your specialists as you may have planned  Please return in 1 year for your yearly visit, or sooner if needed, with Lab testing done 3-5 days before

## 2017-05-10 NOTE — Assessment & Plan Note (Signed)

## 2017-05-10 NOTE — Assessment & Plan Note (Signed)
Mild to mod, for steroid cream prn,  to f/u any worsening symptoms or concerns

## 2017-05-10 NOTE — Assessment & Plan Note (Signed)
Asympt,  to f/u any worsening symptoms or concerns,  Lab Results  Component Value Date   HGBA1C 5.5 04/30/2017

## 2017-06-04 ENCOUNTER — Ambulatory Visit: Payer: Medicare Other | Admitting: Pulmonary Disease

## 2017-06-04 ENCOUNTER — Encounter: Payer: Self-pay | Admitting: Pulmonary Disease

## 2017-06-04 VITALS — BP 146/80 | HR 60 | Ht 69.0 in | Wt 283.0 lb

## 2017-06-04 DIAGNOSIS — Z6841 Body Mass Index (BMI) 40.0 and over, adult: Secondary | ICD-10-CM | POA: Diagnosis not present

## 2017-06-04 DIAGNOSIS — G4733 Obstructive sleep apnea (adult) (pediatric): Secondary | ICD-10-CM

## 2017-06-04 NOTE — Progress Notes (Signed)
[facility]  Chief Complaint  Patient presents with  . Follow-up    Vital signs: BP (!) 146/80 (BP Location: Left Arm, Cuff Size: Normal)   Pulse 60   Ht 5\' 9"  (1.753 m)   Wt 283 lb (128.4 kg)   SpO2 99%   BMI 41.79 kg/m   History of Present Illness: Calvin [name] is a 67 y.o. male obstructive sleep apnea.  He is doing well with CPAP.  Has full face mask.  No issue with mask fit.  Can't sleep w/o CPAP.  Is planning to start going to the gym.  Asked about cleaning machines for CPAP.  Physical Exam:  General - pleasant Eyes - pupils reactive ENT - no sinus tenderness, no oral exudate, no LAN, MP 4, enlarged tongue Cardiac - regular, no murmur Chest - no wheeze, rales Abd - soft, non tender Ext - no edema Skin - no rashes Neuro - normal strength Psych - normal mood   Assessment/Plan:  Obstructive sleep apnea. - he is compliant with CPAP and reports benefit from therapy - continue auto CPAP  Obesity. - encouraged him to keep up with is weight loss efforts   Patient Instructions  Follow up in 1 year    [name], MD [place] 06/04/2017, 10:27 AM Pager:  [phone]  Flow Sheet  Sleep tests: HST 12/29/13 >> AHI 62.8, SaO2 low 71%. Auto CPAP 04/02/17 to 05/01/17 >> used on 30 of 30 nights average 7 hrs 37 min.  Average AHI 2.1 with median CPAP 8 and 95 th percentile CPAP 13 cm H2O.  Past Medical History: He  has a past medical history of Allergy, Arthritis, Diverticulosis, Gout, Hyperlipidemia, Hypertension, OSA (obstructive sleep apnea) (11/13/2013), Sickle cell trait ([place]), and Sleep apnea.  Past Surgical History: He  has a past surgical history that includes Rotator cuff repair; Throat surgery; Polypectomy; and Colonoscopy.  Family History: His family history negative for sleep apnea.  Social History: He  reports that he quit smoking about 40 years ago. His smoking use included cigarettes.  He has a 10.00 pack-year smoking history. He has never used smokeless tobacco. He reports that he does not drink alcohol or use drugs.  Medications: Allergies as of 06/04/2017   No Known Allergies     Medication List        Accurate as of 06/04/17 10:27 AM. Always use your most recent med list.          allopurinol 100 MG tablet Commonly known as:  ZYLOPRIM Take 2 tablets (200 mg total) by mouth daily.   aspirin 81 MG tablet Take 81 mg by mouth daily.   clotrimazole-betamethasone cream Commonly known as:  LOTRISONE Use as directed twice per day as needed to affected area   colchicine 0.6 MG tablet take 1 tablet by mouth once daily   finasteride 5 MG tablet Commonly known as:  PROSCAR Take 1 tablet (5 mg total) by mouth daily.   fluticasone 50 MCG/ACT nasal spray Commonly known as:  FLONASE Place 2 sprays into the nose daily.   glucosamine-chondroitin 500-400 MG tablet Take 1 tablet by mouth daily.   hydrochlorothiazide 25 MG tablet Commonly known as:  HYDRODIURIL TAKE ONE-HALF TABLET BY  MOUTH EVERY DAY   indomethacin 50 MG capsule Commonly known as:  INDOCIN 1 tab by mouth three times per day as needed   irbesartan 300 MG tablet Commonly known as:  AVAPRO Take 1 tablet (300 mg total) by  mouth daily.   loratadine 10 MG tablet Commonly known as:  CLARITIN Take 10 mg by mouth daily as needed for allergies.   naproxen 500 MG tablet Commonly known as:  NAPROSYN Take 1 tablet (500 mg total) by mouth 2 (two) times daily with a meal.   NON FORMULARY 1,000 mg daily.   pravastatin 40 MG tablet Commonly known as:  PRAVACHOL TAKE 1 TABLET BY MOUTH  DAILY   sildenafil 100 MG tablet Commonly known as:  VIAGRA Take 1 tablet (100 mg total) by mouth as needed for erectile dysfunction. Yearly physical w/labs due in February must see MD for refills   tamsulosin 0.4 MG Caps capsule Commonly known as:  FLOMAX Take 1 capsule (0.4 mg total) by mouth daily.     verapamil 240 MG CR tablet Commonly known as:  CALAN-SR Take 1 tablet (240 mg total) by mouth at bedtime.

## 2017-06-04 NOTE — Patient Instructions (Signed)
Follow up in 1 year.

## 2017-07-12 ENCOUNTER — Encounter: Payer: Self-pay | Admitting: Internal Medicine

## 2017-07-12 DIAGNOSIS — Z7184 Encounter for health counseling related to travel: Secondary | ICD-10-CM

## 2017-07-13 ENCOUNTER — Other Ambulatory Visit: Payer: Medicare Other

## 2017-07-13 DIAGNOSIS — Z7184 Encounter for health counseling related to travel: Secondary | ICD-10-CM

## 2017-07-13 DIAGNOSIS — Z7189 Other specified counseling: Secondary | ICD-10-CM | POA: Diagnosis not present

## 2017-07-16 LAB — RUBEOLA ANTIBODY IGG: RUBEOLA IGG: 93.7 [AU]/ml

## 2017-07-16 NOTE — Progress Notes (Signed)
Calvin [name]. - 67 y.o. male MRN [id number]  Date of birth: [birth year]  SUBJECTIVE:  Including CC & ROS.  Chief Complaint  Patient presents with  . Wrist Pain    right    Calvin [name]. is a 67 y.o. male that is  presenting with right wrist swelling. Has been taking indomethacin and colchicine for about 2 weeks with some improvement. Has a history of gout and this feels like that. Mild pain located on dorsal aspect of wrist. No injury. Swelling is evident compared to left.    Review of Systems  Constitutional: Negative for fever.  HENT: Negative for congestion.   Respiratory: Negative for cough.   Cardiovascular: Negative for chest pain.  Gastrointestinal: Negative for abdominal pain.  Musculoskeletal: Positive for joint swelling.  Skin: Negative for color change.  Neurological: Negative for weakness.  Hematological: Negative for adenopathy.  Psychiatric/Behavioral: Negative for agitation.    HISTORY: Past Medical, Surgical, Social, and Family History Reviewed & Updated per EMR.   Pertinent Historical Findings include:  Past Medical History:  Diagnosis Date  . Allergy   . Arthritis   . Diverticulosis   . Gout   . Hyperlipidemia   . Hypertension   . OSA (obstructive sleep apnea) 11/13/2013  . Sickle cell trait ([place])   . Sleep apnea     Past Surgical History:  Procedure Laterality Date  . COLONOSCOPY    . POLYPECTOMY    . ROTATOR CUFF REPAIR     left  . THROAT SURGERY     polyps removed from vocal cord    No Known Allergies  Family History  Problem Relation Age of Onset  . Colon cancer Neg Hx      Social History   Socioeconomic History  . Marital status: Married    Spouse name: Not on file  . Number of children: Not on file  . Years of education: Not on file  . Highest education level: Not on file  Occupational History  . Not on file  Social Needs  . Financial resource strain: Not on file  . Food insecurity:    Worry: Not on file   Inability: Not on file  . Transportation needs:    Medical: Not on file    Non-medical: Not on file  Tobacco Use  . Smoking status: Former Smoker    Packs/day: 2.00    Years: 5.00    Pack years: 10.00    Types: Cigarettes    Last attempt to quit: 05/23/1977    Years since quitting: 40.1  . Smokeless tobacco: Never Used  Substance and Sexual Activity  . Alcohol use: No    Comment: QUIT 2002  . Drug use: No    Comment: Completed program in april 2000  . Sexual activity: Yes  Lifestyle  . Physical activity:    Days per week: Not on file    Minutes per session: Not on file  . Stress: Not on file  Relationships  . Social connections:    Talks on phone: Not on file    Gets together: Not on file    Attends religious service: Not on file    Active member of club or organization: Not on file    Attends meetings of clubs or organizations: Not on file    Relationship status: Not on file  . Intimate partner violence:    Fear of current or ex partner: Not on file    Emotionally abused: Not on  file    Physically abused: Not on file    Forced sexual activity: Not on file  Other Topics Concern  . Not on file  Social History Narrative  . Not on file     PHYSICAL EXAM:  VS: BP 110/80 (BP Location: Left Arm, Patient Position: Sitting)   Pulse 70   Ht 5\' 9"  (1.753 m)   Wt 283 lb (128.4 kg)   SpO2 98%   BMI 41.79 kg/m  Physical Exam Gen: NAD, alert, cooperative with exam, well-appearing ENT: normal lips, normal nasal mucosa,  Eye: normal EOM, normal conjunctiva and lids CV:  no edema, +2 pedal pulses   Resp: no accessory muscle use, non-labored,  Skin: no rashes, no areas of induration  Neuro: normal tone, normal sensation to touch Psych:  normal insight, alert and oriented MSK:  Right wrist:  Obvious swelling of the dorsal wrist  Normal ROM  No streaking or redness  Normal finger adduction and abduction  Normal pincer grasp  No snuffbox tenderness  CMC arthritis    Neurovascularly intact   Limited ultrasound: right wrist:  No fracture  Hypoechoic change deep and emanating from the radioscaphoid joint. Appears to have crystalline debris to suggest gout. Degenerative changes of the carpal bones is evident. Possible for gouty arthropathy  No tendinitis in dorsal compartments    Summary: findings suggestive of unresolved gout   Ultrasound and interpretation by Clearance Coots, MD        ASSESSMENT & PLAN:   Right wrist pain Pain seems to be related to a gout flare that hasn't resolved. Has tried colchicine and indomethicin. Uric acid was in 2011 was elevated at 7.8.  - try prednisone  - uric acid  - xray  - if no improvement try CSI

## 2017-07-17 ENCOUNTER — Encounter: Payer: Self-pay | Admitting: Family Medicine

## 2017-07-17 ENCOUNTER — Ambulatory Visit (INDEPENDENT_AMBULATORY_CARE_PROVIDER_SITE_OTHER)
Admission: RE | Admit: 2017-07-17 | Discharge: 2017-07-17 | Disposition: A | Discharge status: Home / Self Care | Payer: Medicare Other | Source: Ambulatory Visit | Attending: Family Medicine | Admitting: Family Medicine

## 2017-07-17 ENCOUNTER — Other Ambulatory Visit (INDEPENDENT_AMBULATORY_CARE_PROVIDER_SITE_OTHER): Payer: Medicare Other

## 2017-07-17 ENCOUNTER — Ambulatory Visit (INDEPENDENT_AMBULATORY_CARE_PROVIDER_SITE_OTHER): Payer: Medicare Other | Admitting: Family Medicine

## 2017-07-17 ENCOUNTER — Ambulatory Visit: Payer: Self-pay

## 2017-07-17 VITALS — BP 110/80 | HR 70 | Ht 69.0 in | Wt 283.0 lb

## 2017-07-17 DIAGNOSIS — M19031 Primary osteoarthritis, right wrist: Secondary | ICD-10-CM | POA: Diagnosis not present

## 2017-07-17 DIAGNOSIS — M25531 Pain in right wrist: Secondary | ICD-10-CM

## 2017-07-17 LAB — URIC ACID: URIC ACID, SERUM: 6.4 mg/dL (ref 4.0–7.8)

## 2017-07-17 MED ORDER — PREDNISONE 10 MG PO TABS: ORAL_TABLET | ORAL | 0 refills | Status: DC

## 2017-07-17 NOTE — Assessment & Plan Note (Signed)
Pain seems to be related to a gout flare that hasn't resolved. Has tried colchicine and indomethicin. Uric acid was in 2011 was elevated at 7.8.  - try prednisone  - uric acid  - xray  - if no improvement try CSI

## 2017-07-17 NOTE — Patient Instructions (Signed)
Good to meet you  Please try the prednisone  I will call you with the results from today  Please follow up with me if your symptoms don't improve

## 2017-07-24 ENCOUNTER — Encounter: Payer: Self-pay | Admitting: Family Medicine

## 2017-08-02 NOTE — Progress Notes (Signed)
[name] [place], [place] [address] Phone: [phone] Subjective:      CC: Right hand, left knee pain  [id number]  Calvin [name]. is a 67 y.o. male coming in with complaint of knee pain. He isn't having bad pain in the knee but wants to discuss glucosamine chondrotin. He has been using naproxen for his pain on hte medial aspect of the knee. He also would like to follow up for wrist pain.  Known arthritic changes.  Has responded to injections in the past.  Not having severe amount of pain at this time no.  Was experiencing tingling in right hand. This has dissipated since last visit with Dr. Raeford Razor.  Patient states that sometimes has a numbness and tingling sensation.  Seems to be in the fingertips.  Reviewing patient's previous notes there was a concern for carpal tunnel.  Patient feels he did not have great information on what to do next.  Feels there is minimal improvement in the last 3 weeks      Past Medical History:  Diagnosis Date  . Allergy   . Arthritis   . Diverticulosis   . Gout   . Hyperlipidemia   . Hypertension   . OSA (obstructive sleep apnea) 11/13/2013  . Sickle cell trait ([place])   . Sleep apnea    Past Surgical History:  Procedure Laterality Date  . COLONOSCOPY    . POLYPECTOMY    . ROTATOR CUFF REPAIR     left  . THROAT SURGERY     polyps removed from vocal cord   Social History   Socioeconomic History  . Marital status: Married    Spouse name: Not on file  . Number of children: Not on file  . Years of education: Not on file  . Highest education level: Not on file  Occupational History  . Not on file  Social Needs  . Financial resource strain: Not on file  . Food insecurity:    Worry: Not on file    Inability: Not on file  . Transportation needs:    Medical: Not on file    Non-medical: Not on file  Tobacco Use  . Smoking status: Former Smoker    Packs/day: 2.00    Years: 5.00    Pack  years: 10.00    Types: Cigarettes    Last attempt to quit: 05/23/1977    Years since quitting: 40.2  . Smokeless tobacco: Never Used  Substance and Sexual Activity  . Alcohol use: No    Comment: QUIT 2002  . Drug use: No    Comment: Completed program in april 2000  . Sexual activity: Yes  Lifestyle  . Physical activity:    Days per week: Not on file    Minutes per session: Not on file  . Stress: Not on file  Relationships  . Social connections:    Talks on phone: Not on file    Gets together: Not on file    Attends religious service: Not on file    Active member of club or organization: Not on file    Attends meetings of clubs or organizations: Not on file    Relationship status: Not on file  Other Topics Concern  . Not on file  Social History Narrative  . Not on file   No Known Allergies Family History  Problem Relation Age of Onset  . Colon cancer Neg Hx      Past  medical history, social, surgical and family history all reviewed in electronic medical record.  No pertanent information unless stated regarding to the chief complaint.   Review of Systems:Review of systems updated and as accurate as of 08/03/17  No headache, visual changes, nausea, vomiting, diarrhea, constipation, dizziness, abdominal pain, skin rash, fevers, chills, night sweats, weight loss, swollen lymph nodes, body aches, joint swelling, muscle aches, chest pain, shortness of breath, mood changes.   Objective  Blood pressure 122/78, pulse 73, height 5\' 9"  (1.753 m), weight 281 lb (127.5 kg), SpO2 98 %. Systems examined below as of 08/03/17   General: No apparent distress alert and oriented x3 mood and affect normal, dressed appropriately.  HEENT: Pupils equal, extraocular movements intact  Respiratory: Patient's speak in full sentences and does not appear short of breath  Cardiovascular: No lower extremity edema, non tender, no erythema  Skin: Warm dry intact with no signs of infection or rash on  extremities or on axial skeleton.  Abdomen: Soft nontender  Neuro: Cranial nerves II through XII are intact, neurovascularly intact in all extremities with 2+ DTRs and 2+ pulses.  Lymph: No lymphadenopathy of posterior or anterior cervical chain or axillae bilaterally.  Gait normal with good balance and coordination.  MSK:  Non tender with full range of motion and good stability and symmetric strength and tone of shoulders, elbows, hip, and ankles bilaterally.  Knee: Left valgus deformity noted. Large thigh to calf ratio.  Tender to palpation over medial and PF joint line.  ROM full in flexion and extension and lower leg rotation. instability with valgus force.  painful patellar compression. Patellar glide with moderate crepitus. Patellar and quadriceps tendons unremarkable. Hamstring and quadriceps strength is normal. Contralateral knee shows mild arthritic changes.  Wrist: Right Inspection normal with no visible erythema or swelling. ROM smooth and normal with good flexion and extension and ulnar/radial deviation that is symmetrical with opposite wrist. Palpation is normal over metacarpals, navicular, lunate, and TFCC; tendons without tenderness/ swelling No snuffbox tenderness. No tenderness over Canal of Gu[place]. Strength 5/5 in all directions without pain. Negative Finkelstein, mild positive Tinel's and phalens. Negative Watson's test.   Impression and Recommendations:     This case required medical decision making of moderate complexity.      Note: This dictation was prepared with Dragon dictation along with smaller phrase technology. Any transcriptional errors that result from this process are unintentional.

## 2017-08-03 ENCOUNTER — Encounter: Payer: Self-pay | Admitting: Family Medicine

## 2017-08-03 ENCOUNTER — Ambulatory Visit: Payer: Medicare Other | Admitting: Family Medicine

## 2017-08-03 ENCOUNTER — Ambulatory Visit: Payer: Self-pay

## 2017-08-03 VITALS — BP 122/78 | HR 73 | Ht 69.0 in | Wt 281.0 lb

## 2017-08-03 DIAGNOSIS — M1A0620 Idiopathic chronic gout, left knee, without tophus (tophi): Secondary | ICD-10-CM | POA: Diagnosis not present

## 2017-08-03 DIAGNOSIS — M25562 Pain in left knee: Secondary | ICD-10-CM

## 2017-08-03 DIAGNOSIS — G8929 Other chronic pain: Secondary | ICD-10-CM | POA: Diagnosis not present

## 2017-08-03 DIAGNOSIS — M1712 Unilateral primary osteoarthritis, left knee: Secondary | ICD-10-CM | POA: Diagnosis not present

## 2017-08-03 DIAGNOSIS — M25531 Pain in right wrist: Secondary | ICD-10-CM | POA: Diagnosis not present

## 2017-08-03 MED ORDER — ALLOPURINOL 300 MG PO TABS: 300.0000 mg | ORAL_TABLET | Freq: Every day | ORAL | 6 refills | Status: DC

## 2017-08-03 NOTE — Assessment & Plan Note (Signed)
Degenerative arthritis of the knee.  Discussed icing regimen anti-inflammatories given.  Discussed the possibility of injections.  Patient does have underlying gout and this can be contributing.  We discussed increasing allopurinol.  Discontinuing glucosamine.  Following up again in 4 weeks

## 2017-08-03 NOTE — Patient Instructions (Signed)
Good to see you  I am glad you are doing better  Ice 20 minutes 2 times daily. Usually after activity and before bed. pennsaid pinkie amount topically 2 times daily as needed.  Stop the glucosamine.  Allopurinol increase to 300mg  daily  Stay active and hydrated Exercises 3 times a week.   Make an appointment in 4 weeks just in case If you need me call [phone]

## 2017-08-03 NOTE — Assessment & Plan Note (Signed)
Based on symptoms and findings sending consistent with potentially mild to moderate carpal tunnel syndrome.  Discussed the possibility of injection which patient declined.  Patient also declined any type of bracing but was given home exercises.  Discussed icing regimen.  Discussed avoiding repetitive activity for flexion topical anti-inflammatories given.  Follow-up again in 4 weeks

## 2017-08-03 NOTE — Assessment & Plan Note (Signed)
Increased allopurinol 300 mg

## 2017-08-04 ENCOUNTER — Other Ambulatory Visit: Payer: Self-pay | Admitting: Internal Medicine

## 2017-08-07 DIAGNOSIS — G4733 Obstructive sleep apnea (adult) (pediatric): Secondary | ICD-10-CM | POA: Diagnosis not present

## 2017-08-29 NOTE — Progress Notes (Signed)
[name] [place], [place] [address] Phone: [phone] Subjective:       CC: Knee pain and wrist pain follow-up  ELF:[id number]  Calvin [name]. is a 67 y.o. male coming in with complaint of knee pain. Patient complains of stiffness in his knee. States that pennsaid has been working well.  Patient states also the allopurinol seems to be helping.  Patient had a flare up of his wrist pain. He took indomethacin instead of naproxen and the pain dissipated. He is back to just taking naproxen. He would like to get a brace today as he declined it last visit.  Right side.  Increasing numbness as well..     Past Medical History:  Diagnosis Date  . Allergy   . Arthritis   . Diverticulosis   . Gout   . Hyperlipidemia   . Hypertension   . OSA (obstructive sleep apnea) 11/13/2013  . Sickle cell trait ([place])   . Sleep apnea    Past Surgical History:  Procedure Laterality Date  . COLONOSCOPY    . POLYPECTOMY    . ROTATOR CUFF REPAIR     left  . THROAT SURGERY     polyps removed from vocal cord   Social History   Socioeconomic History  . Marital status: Married    Spouse name: Not on file  . Number of children: Not on file  . Years of education: Not on file  . Highest education level: Not on file  Occupational History  . Not on file  Social Needs  . Financial resource strain: Not on file  . Food insecurity:    Worry: Not on file    Inability: Not on file  . Transportation needs:    Medical: Not on file    Non-medical: Not on file  Tobacco Use  . Smoking status: Former Smoker    Packs/day: 2.00    Years: 5.00    Pack years: 10.00    Types: Cigarettes    Last attempt to quit: 05/23/1977    Years since quitting: 40.3  . Smokeless tobacco: Never Used  Substance and Sexual Activity  . Alcohol use: No    Comment: QUIT 2002  . Drug use: No    Comment: Completed program in april 2000  . Sexual activity: Yes  Lifestyle  .  Physical activity:    Days per week: Not on file    Minutes per session: Not on file  . Stress: Not on file  Relationships  . Social connections:    Talks on phone: Not on file    Gets together: Not on file    Attends religious service: Not on file    Active member of club or organization: Not on file    Attends meetings of clubs or organizations: Not on file    Relationship status: Not on file  Other Topics Concern  . Not on file  Social History Narrative  . Not on file   No Known Allergies Family History  Problem Relation Age of Onset  . Colon cancer Neg Hx      Past medical history, social, surgical and family history all reviewed in electronic medical record.  No pertanent information unless stated regarding to the chief complaint.   Review of Systems:Review of systems updated and as  No headache, visual changes, nausea, vomiting, diarrhea, constipation, dizziness, abdominal pain, skin rash, fevers, chills, night sweats, weight loss, swollen lymph nodes, body aches,  joint swelling, muscle aches, chest pain, shortness of breath, mood changes.   Objective  Blood pressure 118/72, pulse 76, height 5\' 9"  (1.753 m), weight 280 lb (127 kg), SpO2 98 %.   General: No apparent distress alert and oriented x3 mood and affect normal, dressed appropriately.  HEENT: Pupils equal, extraocular movements intact  Respiratory: Patient's speak in full sentences and does not appear short of breath  Cardiovascular: No lower extremity edema, non tender, no erythema  Skin: Warm dry intact with no signs of infection or rash on extremities or on axial skeleton.  Abdomen: Soft nontender  Neuro: Cranial nerves II through XII are intact, neurovascularly intact in all extremities with 2+ DTRs and 2+ pulses.  Lymph: No lymphadenopathy of posterior or anterior cervical chain or axillae bilaterally.  Gait normal with good balance and coordination.  MSK:  Non tender with full range of motion and good  stability and symmetric strength and tone of shoulders, elbows,  hip, and ankles bilaterally.  Knee: bilateral valgus deformity noted. Large thigh to calf ratio.  Tender to palpation over medial and PF joint line.  ROM full in flexion and extension and lower leg rotation. instability with valgus force.  painful patellar compression. Patellar glide with moderate crepitus. Patellar and quadriceps tendons unremarkable. Hamstring and quadriceps strength is normal.   Wrist: Right Inspection normal with no visible erythema or swelling. ROM smooth and normal with good flexion and extension and ulnar/radial deviation that is symmetrical with opposite wrist. Palpation is normal over metacarpals, navicular, lunate, and TFCC; tendons without tenderness/ swelling No snuffbox tenderness. No tenderness over Canal of Gu[place]. Strength 5/5 in all directions without pain. Negative Finkelstein, positive Tinel's and phalens. Negative Watson's test. Contralateral wrist unremarkable  Procedure: Real-time Ultrasound Guided Injection of right carpal tunnel Device: GE Logiq Q7  Ultrasound guided injection is preferred based studies that show increased duration, increased effect, greater accuracy, decreased procedural pain, increased response rate with ultrasound guided versus blind injection.  Verbal informed consent obtained.  Time-out conducted.  Noted no overlying erythema, induration, or other signs of local infection.  Skin prepped in a sterile fashion.  Local anesthesia: Topical Ethyl chloride.  With sterile technique and under real time ultrasound guidance:  median nerve visualized.  23g 5/8 inch needle inserted distal to proximal approach into nerve sheath. Pictures taken nfor needle placement. Patient did have injection of 2 cc of 1% lidocaine, 1 cc of 0.5% Marcaine, and 1 cc of Kenalog 40 mg/dL. Completed without difficulty  Pain immediately resolved suggesting accurate placement of the medication.    Advised to call if fevers/chills, erythema, induration, drainage, or persistent bleeding.  Images permanently stored and available for review in the ultrasound unit.  Impression: Technically successful ultrasound guided injection. Impression and Recommendations:     This case required medical decision making of moderate complexity.      Note: This dictation was prepared with Dragon dictation along with smaller phrase technology. Any transcriptional errors that result from this process are unintentional.

## 2017-08-30 ENCOUNTER — Encounter: Payer: Self-pay | Admitting: Family Medicine

## 2017-08-30 ENCOUNTER — Ambulatory Visit: Payer: Self-pay

## 2017-08-30 ENCOUNTER — Ambulatory Visit: Payer: Medicare Other | Admitting: Family Medicine

## 2017-08-30 VITALS — BP 118/72 | HR 76 | Ht 69.0 in | Wt 280.0 lb

## 2017-08-30 DIAGNOSIS — M25531 Pain in right wrist: Secondary | ICD-10-CM

## 2017-08-30 DIAGNOSIS — G5601 Carpal tunnel syndrome, right upper limb: Secondary | ICD-10-CM

## 2017-08-30 DIAGNOSIS — M1712 Unilateral primary osteoarthritis, left knee: Secondary | ICD-10-CM

## 2017-08-30 NOTE — Patient Instructions (Addendum)
Good to see you  Calvin [name] is your friend.  pennsaid pinkie amount topically 2 times daily as needed.  Wear the brace day and night for 1 week then nightly for 2 weeks Should calm it down well  Once again make an appointment in 4 weeks just in case.

## 2017-08-31 DIAGNOSIS — G5601 Carpal tunnel syndrome, right upper limb: Secondary | ICD-10-CM | POA: Insufficient documentation

## 2017-08-31 NOTE — Assessment & Plan Note (Signed)
Mild degree, injected today and tolerated procedure well.  Discussed icing regimen, brace given today.  Home exercises encouraged.  Follow-up again in 4 weeks

## 2017-08-31 NOTE — Assessment & Plan Note (Signed)
Stable.  No changes at this point.  Declined any type of injection.

## 2017-09-03 DIAGNOSIS — G4733 Obstructive sleep apnea (adult) (pediatric): Secondary | ICD-10-CM | POA: Diagnosis not present

## 2017-09-07 ENCOUNTER — Ambulatory Visit (INDEPENDENT_AMBULATORY_CARE_PROVIDER_SITE_OTHER): Payer: Medicare Other | Admitting: *Deleted

## 2017-09-07 VITALS — BP 142/84 | HR 68 | Resp 18 | Ht 69.0 in | Wt 281.0 lb

## 2017-09-07 DIAGNOSIS — Z0000 Encounter for general adult medical examination without abnormal findings: Secondary | ICD-10-CM

## 2017-09-07 NOTE — Patient Instructions (Addendum)
Continue doing brain stimulating activities (puzzles, reading, adult coloring books, staying active) to keep memory sharp.   Continue to eat heart healthy diet (full of fruits, vegetables, whole grains, lean protein, water--limit salt, fat, and sugar intake) and increase physical activity as tolerated.   Calvin [name] , Thank you for taking time to come for your Medicare Wellness Visit. I appreciate your ongoing commitment to your health goals. Please review the following plan we discussed and let me know if I can assist you in the future.   These are the goals we discussed: Goals    . Patient Stated     I want to eat dinner earlier before 6:00, exercise routinely 3 times weekly. Enjoy life and family. Research better choices of desserts by looking up healthier  Recipes on line sub. With fruit.        This is a list of the screening recommended for you and due dates:  Health Maintenance  Topic Date Due  . Flu Shot  10/11/2017  . Tetanus Vaccine  10/18/2017  . Colon Cancer Screening  04/13/2026  .  Hepatitis C: One time screening is recommended by Center for Disease Control  (CDC) for  adults born from [birth year] through 1967.   Completed  . Pneumonia vaccines  Completed     Health Maintenance, Male A healthy lifestyle and preventive care is important for your health and wellness. Ask your health care provider about what schedule of regular examinations is right for you. What should I know about weight and diet? Eat a Healthy Diet  Eat plenty of vegetables, fruits, whole grains, low-fat dairy products, and lean protein.  Do not eat a lot of foods high in solid fats, added sugars, or salt.  Maintain a Healthy Weight Regular exercise can help you achieve or maintain a healthy weight. You should:  Do at least 150 minutes of exercise each week. The exercise should increase your heart rate and make you sweat (moderate-intensity exercise).  Do strength-training exercises at least twice a  week.  Watch Your Levels of Cholesterol and Blood Lipids  Have your blood tested for lipids and cholesterol every 5 years starting at 69 years of age. If you are at high risk for heart disease, you should start having your blood tested when you are 69 years old. You may need to have your cholesterol levels checked more often if: ? Your lipid or cholesterol levels are high. ? You are older than 69 years of age. ? You are at high risk for heart disease.  What should I know about cancer screening? Many types of cancers can be detected early and may often be prevented. Lung Cancer  You should be screened every year for lung cancer if: ? You are a current smoker who has smoked for at least 30 years. ? You are a former smoker who has quit within the past 15 years.  Talk to your health care provider about your screening options, when you should start screening, and how often you should be screened.  Colorectal Cancer  Routine colorectal cancer screening usually begins at 69 years of age and should be repeated every 5-10 years until you are 69 years old. You may need to be screened more often if early forms of precancerous polyps or small growths are found. Your health care provider may recommend screening at an earlier age if you have risk factors for colon cancer.  Your health care provider may recommend using home test kits to check  for hidden blood in the stool.  A small camera at the end of a tube can be used to examine your colon (sigmoidoscopy or colonoscopy). This checks for the earliest forms of colorectal cancer.  Prostate and Testicular Cancer  Depending on your age and overall health, your health care provider may do certain tests to screen for prostate and testicular cancer.  Talk to your health care provider about any symptoms or concerns you have about testicular or prostate cancer.  Skin Cancer  Check your skin from head to toe regularly.  Tell your health care provider  about any new moles or changes in moles, especially if: ? There is a change in a mole's size, shape, or color. ? You have a mole that is larger than a pencil eraser.  Always use sunscreen. Apply sunscreen liberally and repeat throughout the day.  Protect yourself by wearing long sleeves, pants, a wide-brimmed hat, and sunglasses when outside.  What should I know about heart disease, diabetes, and high blood pressure?  If you are 85-59 years of age, have your blood pressure checked every 3-5 years. If you are 9 years of age or older, have your blood pressure checked every year. You should have your blood pressure measured twice-once when you are at a hospital or clinic, and once when you are not at a hospital or clinic. Record the average of the two measurements. To check your blood pressure when you are not at a hospital or clinic, you can use: ? An automated blood pressure machine at a pharmacy. ? A home blood pressure monitor.  Talk to your health care provider about your target blood pressure.  If you are between 37-27 years old, ask your health care provider if you should take aspirin to prevent heart disease.  Have regular diabetes screenings by checking your fasting blood sugar level. ? If you are at a normal weight and have a low risk for diabetes, have this test once every three years after the age of 8. ? If you are overweight and have a high risk for diabetes, consider being tested at a younger age or more often.  A one-time screening for abdominal aortic aneurysm (AAA) by ultrasound is recommended for men aged 57-75 years who are current or former smokers. What should I know about preventing infection? Hepatitis B If you have a higher risk for hepatitis B, you should be screened for this virus. Talk with your health care provider to find out if you are at risk for hepatitis B infection. Hepatitis C Blood testing is recommended for:  Everyone born from [birth year] through  1965.  Anyone with known risk factors for hepatitis C.  Sexually Transmitted Diseases (STDs)  You should be screened each year for STDs including gonorrhea and chlamydia if: ? You are sexually active and are younger than 69 years of age. ? You are older than 69 years of age and your health care provider tells you that you are at risk for this type of infection. ? Your sexual activity has changed since you were last screened and you are at an increased risk for chlamydia or gonorrhea. Ask your health care provider if you are at risk.  Talk with your health care provider about whether you are at high risk of being infected with HIV. Your health care provider may recommend a prescription medicine to help prevent HIV infection.  What else can I do?  Schedule regular health, dental, and eye exams.  Stay current with  your vaccines (immunizations).  Do not use any tobacco products, such as cigarettes, chewing tobacco, and e-cigarettes. If you need help quitting, ask your health care provider.  Limit alcohol intake to no more than 2 drinks per day. One drink equals 12 ounces of beer, 5 ounces of wine, or 1 ounces of hard liquor.  Do not use street drugs.  Do not share needles.  Ask your health care provider for help if you need support or information about quitting drugs.  Tell your health care provider if you often feel depressed.  Tell your health care provider if you have ever been abused or do not feel safe at home. This information is not intended to replace advice given to you by your health care provider. Make sure you discuss any questions you have with your health care provider. Document Released: 08/26/2007 Document Revised: 10/27/2015 Document Reviewed: 12/01/2014 Elsevier Interactive Patient Education  [employer].

## 2017-09-07 NOTE — Progress Notes (Addendum)
Subjective:   Calvin [name]. is a 67 y.o. male who presents for Medicare Annual/Subsequent preventive examination.  Review of Systems:  No ROS.  Medicare Wellness Visit. Additional risk factors are reflected in the social history.  Cardiac Risk Factors include: advanced age (>54men, >67 women);dyslipidemia;hypertension;male gender;sedentary lifestyle Sleep patterns: feels rested on waking, gets up 1 times nightly to void and sleeps 7-8 hours nightly.  Uses C-PAP  Home Safety/Smoke Alarms: Feels safe in home. Smoke alarms in place.  Living environment; residence and Firearm Safety: 2-story house, no firearms., Lives with wife, no needs for DME, good support system Seat Belt Safety/Bike Helmet: Wears seat belt.   PSA-  Lab Results  Component Value Date   PSA 1.95 04/30/2017   PSA 1.66 04/26/2016   PSA 1.88 04/14/2015       Objective:    Vitals: BP (!) 142/84   Pulse 68   Resp 18   Ht 5\' 9"  (1.753 m)   Wt 281 lb (127.5 kg)   SpO2 98%   BMI 41.50 kg/m   Body mass index is 41.5 kg/m.  Advanced Directives 09/07/2017 09/06/2016 04/13/2016 04/03/2016  Does Patient Have a Medical Advance Directive? Yes Yes Yes Yes  Type of Paramedic of [place];Living will [place];Living will [place];Living will  Copy of [place] in Chart? - No - copy requested - -    Tobacco Social History   Tobacco Use  Smoking Status Former Smoker  . Packs/day: 2.00  . Years: 5.00  . Pack years: 10.00  . Types: Cigarettes  . Last attempt to quit: 05/23/1977  . Years since quitting: 40.3  Smokeless Tobacco Never Used     Counseling given: Not Answered     Past Medical History:  Diagnosis Date  . Allergy   . Arthritis   . Diverticulosis   . Gout   . Hyperlipidemia   . Hypertension   . OSA (obstructive sleep apnea) 11/13/2013  . Sickle cell trait ([place])   . Sleep apnea     Past Surgical History:  Procedure Laterality Date  . COLONOSCOPY    . POLYPECTOMY    . ROTATOR CUFF REPAIR     left  . THROAT SURGERY     polyps removed from vocal cord   Family History  Problem Relation Age of Onset  . Colon cancer Neg Hx    Social History   Socioeconomic History  . Marital status: Married    Spouse name: Not on file  . Number of children: 1  . Years of education: Not on file  . Highest education level: Not on file  Occupational History  . Not on file  Social Needs  . Financial resource strain: Not hard at all  . Food insecurity:    Worry: Never true    Inability: Never true  . Transportation needs:    Medical: No    Non-medical: No  Tobacco Use  . Smoking status: Former Smoker    Packs/day: 2.00    Years: 5.00    Pack years: 10.00    Types: Cigarettes    Last attempt to quit: 05/23/1977    Years since quitting: 40.3  . Smokeless tobacco: Never Used  Substance and Sexual Activity  . Alcohol use: No    Comment: QUIT 2002  . Drug use: No    Comment: Completed program in april 2000  . Sexual activity: Yes  Lifestyle  . Physical activity:    Days per week: 3 days    Minutes per session: 60 min  . Stress: Not at all  Relationships  . Social connections:    Talks on phone: More than three times a week    Gets together: More than three times a week    Attends religious service: More than 4 times per year    Active member of club or organization: Yes    Attends meetings of clubs or organizations: More than 4 times per year    Relationship status: Married  Other Topics Concern  . Not on file  Social History Narrative  . Not on file    Outpatient Encounter Medications as of 09/07/2017  Medication Sig  . allopurinol (ZYLOPRIM) 300 MG tablet Take 1 tablet (300 mg total) by mouth daily.  Marland Kitchen aspirin 81 MG tablet Take 81 mg by mouth daily.    . clotrimazole-betamethasone (LOTRISONE) cream Use as directed twice per day as needed to affected area   . colchicine 0.6 MG tablet take 1 tablet by mouth once daily  . Diclofenac Sodium (PENNSAID) 2 % SOLN Place 4 % onto the skin as needed.  . finasteride (PROSCAR) 5 MG tablet Take 1 tablet (5 mg total) by mouth daily.  . fluticasone (FLONASE) 50 MCG/ACT nasal spray Place 2 sprays into the nose daily.  Marland Kitchen glucosamine-chondroitin 500-400 MG tablet Take 1 tablet by mouth daily.    . hydrochlorothiazide (HYDRODIURIL) 25 MG tablet TAKE ONE-HALF TABLET BY  MOUTH EVERY DAY  . indomethacin (INDOCIN) 50 MG capsule 1 tab by mouth three times per day as needed  . irbesartan (AVAPRO) 300 MG tablet TAKE 1 TABLET BY MOUTH  DAILY  . loratadine (CLARITIN) 10 MG tablet Take 10 mg by mouth daily as needed for allergies.  . naproxen (NAPROSYN) 500 MG tablet Take 1 tablet (500 mg total) by mouth 2 (two) times daily with a meal.  . pravastatin (PRAVACHOL) 40 MG tablet TAKE 1 TABLET BY MOUTH  DAILY  . sildenafil (VIAGRA) 100 MG tablet Take 1 tablet (100 mg total) by mouth as needed for erectile dysfunction. Yearly physical w/labs due in February must see MD for refills  . tamsulosin (FLOMAX) 0.4 MG CAPS capsule Take 1 capsule (0.4 mg total) by mouth daily.  . verapamil (CALAN-SR) 240 MG CR tablet Take 1 tablet (240 mg total) by mouth at bedtime.  . [DISCONTINUED] allopurinol (ZYLOPRIM) 100 MG tablet TAKE 2 TABLETS BY MOUTH  DAILY  . [DISCONTINUED] irbesartan (AVAPRO) 300 MG tablet Take 1 tablet (300 mg total) by mouth daily.  . [DISCONTINUED] NON FORMULARY 1,000 mg daily.  . [DISCONTINUED] predniSONE (DELTASONE) 10 MG tablet TAKE 3 TABS ONCE DAILY FOR 3 DAYS, 2 FOR 3 DAYS, 1 FOR 2 DAYS THEN 1/2 FOR 2 DAYS (Patient not taking: Reported on 09/07/2017)  . [DISCONTINUED] tamsulosin (FLOMAX) 0.4 MG CAPS capsule TAKE 1 CAPSULE BY MOUTH  DAILY  . [DISCONTINUED] verapamil (CALAN-SR) 240 MG CR tablet TAKE 1 TABLET BY MOUTH AT  BEDTIME   No facility-administered encounter medications on file as of 09/07/2017.     Activities  of Daily Living In your present state of health, do you have any difficulty performing the following activities: 09/07/2017  Hearing? N  Vision? N  Difficulty concentrating or making decisions? N  Walking or climbing stairs? N  Dressing or bathing? N  Doing errands, shopping? N  Preparing Food and eating ? N  Using the Toilet? N  In the past six months, have you accidently leaked urine? N  Do you have problems with loss of bowel control? N  Managing your Medications? N  Managing your Finances? N  Housekeeping or managing your Housekeeping? N  Some recent data might be hidden    Patient Care Team: Biagio Borg, MD as PCP - Patric Dykes, MD as Consulting Physician (Urology) Netta Cedars, MD as Consulting Physician (Orthopedic Surgery)   Assessment:   This is a routine wellness examination for Arya. Physical assessment deferred to PCP.   Exercise Activities and Dietary recommendations Current Exercise Habits: Structured exercise class, Type of exercise: treadmill;strength training/weights, Time (Minutes): 60, Frequency (Times/Week): 3, Weekly Exercise (Minutes/Week): 180, Intensity: Mild, Exercise limited by: orthopedic condition(s)  Diet (meal preparation, eat out, water intake, caffeinated beverages, dairy products, fruits and vegetables): in general, a "healthy" diet  , well balanced   Reviewed heart healthy diet, discussed weight loss strategies. Encouraged patient to increase daily water and fluid intake. Diet education was provided via handout.     Goals    . Patient Stated     I want to eat dinner earlier before 6:00, exercise routinely 3 times weekly. Enjoy life and family. Research better choices of desserts by looking up healthier  Recipes on line sub. With fruit.        Fall Risk Fall Risk  09/07/2017 05/08/2017 09/06/2016 05/04/2016 04/23/2015  Falls in the past year? No No Yes Yes No  Number falls in past yr: - - 1 1 -  Injury with Fall? - - Yes Yes -   Comment - - - saw Dr Norris/ortho pain to left shoulder after injury due to fall , with hx of left shoulder surgury -  Follow up - - Falls prevention discussed - -    Depression Screen PHQ 2/9 Scores 09/07/2017 05/08/2017 09/06/2016 05/04/2016  PHQ - 2 Score 0 0 0 0    Cognitive Function MMSE - Mini Mental State Exam 09/07/2017  Orientation to time 5  Orientation to Place 5  Registration 3  Attention/ Calculation 5  Recall 3  Language- name 2 objects 2  Language- repeat 1  Language- follow 3 step command 3  Language- read & follow direction 1  Write a sentence 1  Copy design 1  Total score 30        Immunization History  Administered Date(s) Administered  . H1N1 03/30/2008  . Influenza Split 01/18/2012, 11/11/2013  . Influenza Whole 01/14/2007, 01/11/2009  . Influenza, High Dose Seasonal PF 01/03/2016  . Influenza-Unspecified 02/02/2015, 01/04/2017  . Pneumococcal Conjugate-13 08/27/2013  . Pneumococcal Polysaccharide-23 12/10/2013  . Td 10/19/2007  . Zoster 04/01/2009   Screening Tests Health Maintenance  Topic Date Due  . INFLUENZA VACCINE  10/11/2017  . TETANUS/TDAP  10/18/2017  . COLONOSCOPY  04/13/2026  . Hepatitis C Screening  Completed  . PNA vac Low Risk Adult  Completed        Plan:     Continue doing brain stimulating activities (puzzles, reading, adult coloring books, staying active) to keep memory sharp.   Continue to eat heart healthy diet (full of fruits, vegetables, whole grains, lean protein, water--limit salt, fat, and sugar intake) and increase physical activity as tolerated.  I have personally reviewed and noted the following in the patient's chart:   . Medical and social history . Use of alcohol, tobacco or illicit drugs  . Current medications and supplements . Functional ability and status . Nutritional status .  Physical activity . Advanced directives . List of other physicians . Vitals . Screenings to include cognitive,  depression, and falls . Referrals and appointments  In addition, I have reviewed and discussed with patient certain preventive protocols, quality metrics, and best practice recommendations. A written personalized care plan for preventive services as well as general preventive health recommendations were provided to patient.     Michiel Cowboy, RN  09/07/2017  Medical screening examination/treatment/procedure(s) were performed by non-physician practitioner and as supervising physician I was immediately available for consultation/collaboration. I agree with above. Cathlean Cower, MD

## 2017-09-26 NOTE — Progress Notes (Signed)
[name] [address] [place], [place] [address] Phone: [phone] Subjective:     CC: Right wrist pain  NIO:[id number]  Calvin [name]. is a 67 y.o. male coming in with complaint of right wrist pain. He was wearing a brace after last visit which helped improve his pain. He said that one week ago he started to having swelling over the joint. Is unable to close his hand. Patient said he went back on gout medications 5 days ago. Has helped swelling to come down in his opinion. Pain with wrist flexion and pronation and supination. Is back to wearing brace again at night and occasionally during the day.   Patient was seen previously and did have a carpal tunnel injection.  States that the tingling numbness in the fingers has completely resolved at this moment fortunately the swelling was there.  Feels much more like his gout and did respond to the medications.     Past Medical History:  Diagnosis Date  . Allergy   . Arthritis   . Diverticulosis   . Gout   . Hyperlipidemia   . Hypertension   . OSA (obstructive sleep apnea) 11/13/2013  . Sickle cell trait ([place])   . Sleep apnea    Past Surgical History:  Procedure Laterality Date  . COLONOSCOPY    . POLYPECTOMY    . ROTATOR CUFF REPAIR     left  . THROAT SURGERY     polyps removed from vocal cord   Social History   Socioeconomic History  . Marital status: Married    Spouse name: Not on file  . Number of children: 1  . Years of education: Not on file  . Highest education level: Not on file  Occupational History  . Not on file  Social Needs  . Financial resource strain: Not hard at all  . Food insecurity:    Worry: Never true    Inability: Never true  . Transportation needs:    Medical: No    Non-medical: No  Tobacco Use  . Smoking status: Former Smoker    Packs/day: 2.00    Years: 5.00    Pack years: 10.00    Types: Cigarettes    Last attempt to quit: 05/23/1977    Years since  quitting: 40.3  . Smokeless tobacco: Never Used  Substance and Sexual Activity  . Alcohol use: No    Comment: QUIT 2002  . Drug use: No    Comment: Completed program in april 2000  . Sexual activity: Yes  Lifestyle  . Physical activity:    Days per week: 3 days    Minutes per session: 60 min  . Stress: Not at all  Relationships  . Social connections:    Talks on phone: More than three times a week    Gets together: More than three times a week    Attends religious service: More than 4 times per year    Active member of club or organization: Yes    Attends meetings of clubs or organizations: More than 4 times per year    Relationship status: Married  Other Topics Concern  . Not on file  Social History Narrative  . Not on file   No Known Allergies Family History  Problem Relation Age of Onset  . Colon cancer Neg Hx      Past medical history, social, surgical and family history all reviewed in electronic medical record.  No pertanent information  unless stated regarding to the chief complaint.   Review of Systems:Review of systems updated and as accurate as of 09/27/17  No headache, visual changes, nausea, vomiting, diarrhea, constipation, dizziness, abdominal pain, skin rash, fevers, chills, night sweats, weight loss, swollen lymph nodes, body aches,s, chest pain, shortness of breath, mood changes.  Positive muscle aches, joint swelling  Objective  Blood pressure 102/60, pulse 64, height 5\' 9"  (1.753 m), weight 274 lb (124.3 kg), SpO2 98 %. Systems examined below as of 09/27/17   General: No apparent distress alert and oriented x3 mood and affect normal, dressed appropriately.  HEENT: Pupils equal, extraocular movements intact  Respiratory: Patient's speak in full sentences and does not appear short of breath  Cardiovascular: No lower extremity edema, non tender, no erythema  Skin: Warm dry intact with no signs of infection or rash on extremities or on axial skeleton.    Abdomen: Soft nontender  Neuro: Cranial nerves II through XII are intact, neurovascularly intact in all extremities with 2+ DTRs and 2+ pulses.  Lymph: No lymphadenopathy of posterior or anterior cervical chain or axillae bilaterally.  Gait normal with good balance and coordination.  MSK:  Non tender with full range of motion and good stability and symmetric strength and tone of shoulders, elbows,  hip, knee and ankles bilaterally.  Moderate arthritic changes of multiple joints  wrist: Right  Inspection patient does have diffuse swelling of the soft tissue compared to the contralateral side that is moderately tender to palpation ROM smooth and normal with good flexion and extension and ulnar/radial deviation that is symmetrical with opposite wrist. Palpation is normal over metacarpals, navicular, lunate, and TFCC; tendons without tenderness/ swelling No snuffbox tenderness. No tenderness over [place]. Strength 5/5 in all directions without pain. Negative Finkelstein, tinel's and phalens.  This is an improvement from previous exam Negative Watson's test.  Left wrist shows some Tinel's being positive nontender with good grip strength   Impression and Recommendations:     This case required medical decision making of moderate complexity.      Note: This dictation was prepared with Dragon dictation along with smaller phrase technology. Any transcriptional errors that result from this process are unintentional.

## 2017-09-27 ENCOUNTER — Encounter: Payer: Self-pay | Admitting: Family Medicine

## 2017-09-27 ENCOUNTER — Ambulatory Visit: Payer: Medicare Other | Admitting: Family Medicine

## 2017-09-27 DIAGNOSIS — G5601 Carpal tunnel syndrome, right upper limb: Secondary | ICD-10-CM

## 2017-09-27 DIAGNOSIS — M1A0620 Idiopathic chronic gout, left knee, without tophus (tophi): Secondary | ICD-10-CM

## 2017-09-27 DIAGNOSIS — G5602 Carpal tunnel syndrome, left upper limb: Secondary | ICD-10-CM | POA: Diagnosis not present

## 2017-09-27 MED ORDER — ALLOPURINOL 300 MG PO TABS
300.0000 mg | ORAL_TABLET | Freq: Every day | ORAL | 3 refills | Status: DC
Start: 2017-09-27 — End: 2018-05-09

## 2017-09-27 NOTE — Patient Instructions (Addendum)
Good to see you  Refilled the allopurinol and will be mail order.  Stay hydrated Tart cherry extract up to 1500mg  at night When you need I will refill the colchicine and the indomethacin  pennsaid pinkie amount topically 2 times daily as needed.  Maybe gloves at night See me again in 6 weeks

## 2017-09-27 NOTE — Assessment & Plan Note (Signed)
Do believe that this is more secondary to gout.  We discussed icing regimen.  Patient is going to do the allopurinol 300 mg on a more regular basis.  Encourage patient only to use colchicine for breakthrough, discussed natural medications over-the-counter that I think will be beneficial.  Patient has had some mild confusion recently.  We discussed icing regimen.  Discussed possibly changing of the diuretic.

## 2017-09-27 NOTE — Assessment & Plan Note (Signed)
Discussed icing regimen and home exercises.  Discussed bracing still at night.  Having some mild similar symptoms on the contralateral side.  Worsening symptoms consider repeating injection.  Follow-up again in 4 to 6 weeks

## 2017-11-05 DIAGNOSIS — G4733 Obstructive sleep apnea (adult) (pediatric): Secondary | ICD-10-CM | POA: Diagnosis not present

## 2017-11-08 ENCOUNTER — Encounter: Payer: Self-pay | Admitting: Family Medicine

## 2017-11-08 ENCOUNTER — Ambulatory Visit: Payer: Medicare Other | Admitting: Family Medicine

## 2017-11-08 ENCOUNTER — Ambulatory Visit: Payer: Self-pay

## 2017-11-08 VITALS — BP 140/82 | HR 68 | Ht 69.0 in | Wt 284.0 lb

## 2017-11-08 DIAGNOSIS — M24131 Other articular cartilage disorders, right wrist: Secondary | ICD-10-CM | POA: Diagnosis not present

## 2017-11-08 DIAGNOSIS — M25531 Pain in right wrist: Secondary | ICD-10-CM

## 2017-11-08 HISTORY — DX: Other articular cartilage disorders, right wrist: M24.131

## 2017-11-08 NOTE — Progress Notes (Signed)
[name] [place] [place], [place] [address] Phone: [phone] Subjective:     CC: Right wrist pain  GYI:[id number]  Calvin [name]. is a 67 y.o. male coming in with complaint of right wrist pain. He has been using indocin and colchicine and this improves his pain. He will stop taking the meds and his pain increases. When he doesn't take indocin he uses naprosyn. He is becoming concerned as he is not able to ride his motorcycle or go bowling.  Seems to be more on the dorsal aspect of the wrist.  Patient states that it hurts with certain activities such as opening a jar.  Noticing increasing swelling as well.   Previous x-rays of the right wrist were independently visualized by me showing the patient did have gapping between the scaphoid and lunate bone likely within disassociation and increasing odds of TFCC injury  Past Medical History:  Diagnosis Date  . Allergy   . Arthritis   . Diverticulosis   . Gout   . Hyperlipidemia   . Hypertension   . OSA (obstructive sleep apnea) 11/13/2013  . Sickle cell trait ([facility])   . Sleep apnea    Past Surgical History:  Procedure Laterality Date  . COLONOSCOPY    . POLYPECTOMY    . ROTATOR CUFF REPAIR     left  . THROAT SURGERY     polyps removed from vocal cord   Social History   Socioeconomic History  . Marital status: Married    Spouse name: Not on file  . Number of children: 1  . Years of education: Not on file  . Highest education level: Not on file  Occupational History  . Not on file  Social Needs  . Financial resource strain: Not hard at all  . Food insecurity:    Worry: Never true    Inability: Never true  . Transportation needs:    Medical: No    Non-medical: No  Tobacco Use  . Smoking status: Former Smoker    Packs/day: 2.00    Years: 5.00    Pack years: 10.00    Types: Cigarettes    Last attempt to quit: 05/23/1977    Years since quitting: 40.4  . Smokeless tobacco: Never  Used  Substance and Sexual Activity  . Alcohol use: No    Comment: QUIT 2002  . Drug use: No    Comment: Completed program in april 2000  . Sexual activity: Yes  Lifestyle  . Physical activity:    Days per week: 3 days    Minutes per session: 60 min  . Stress: Not at all  Relationships  . Social connections:    Talks on phone: More than three times a week    Gets together: More than three times a week    Attends religious service: More than 4 times per year    Active member of club or organization: Yes    Attends meetings of clubs or organizations: More than 4 times per year    Relationship status: Married  Other Topics Concern  . Not on file  Social History Narrative  . Not on file   No Known Allergies Family History  Problem Relation Age of Onset  . Colon cancer Neg Hx      Current Outpatient Medications (Cardiovascular):  .  hydrochlorothiazide (HYDRODIURIL) 25 MG tablet, TAKE ONE-HALF TABLET BY  MOUTH EVERY DAY .  irbesartan (AVAPRO) 300 MG tablet, TAKE 1  TABLET BY MOUTH  DAILY .  pravastatin (PRAVACHOL) 40 MG tablet, TAKE 1 TABLET BY MOUTH  DAILY .  sildenafil (VIAGRA) 100 MG tablet, Take 1 tablet (100 mg total) by mouth as needed for erectile dysfunction. Yearly physical w/labs due in February must see MD for refills .  verapamil (CALAN-SR) 240 MG CR tablet, Take 1 tablet (240 mg total) by mouth at bedtime.  Current Outpatient Medications (Respiratory):  .  fluticasone (FLONASE) 50 MCG/ACT nasal spray, Place 2 sprays into the nose daily. Marland Kitchen  loratadine (CLARITIN) 10 MG tablet, Take 10 mg by mouth daily as needed for allergies.  Current Outpatient Medications (Analgesics):  .  allopurinol (ZYLOPRIM) 300 MG tablet, Take 1 tablet (300 mg total) by mouth daily. Marland Kitchen  aspirin 81 MG tablet, Take 81 mg by mouth daily.   .  colchicine 0.6 MG tablet, take 1 tablet by mouth once daily .  indomethacin (INDOCIN) 50 MG capsule, 1 tab by mouth three times per day as needed .   naproxen (NAPROSYN) 500 MG tablet, Take 1 tablet (500 mg total) by mouth 2 (two) times daily with a meal.   Current Outpatient Medications (Other):  .  clotrimazole-betamethasone (LOTRISONE) cream, Use as directed twice per day as needed to affected area .  Diclofenac Sodium (PENNSAID) 2 % SOLN, Place 4 % onto the skin as needed. .  finasteride (PROSCAR) 5 MG tablet, Take 1 tablet (5 mg total) by mouth daily. Marland Kitchen  glucosamine-chondroitin 500-400 MG tablet, Take 1 tablet by mouth daily.   .  tamsulosin (FLOMAX) 0.4 MG CAPS capsule, Take 1 capsule (0.4 mg total) by mouth daily.    Past medical history, social, surgical and family history all reviewed in electronic medical record.  No pertanent information unless stated regarding to the chief complaint.   Review of Systems:  No headache, visual changes, nausea, vomiting, diarrhea, constipation, dizziness, abdominal pain, skin rash, fevers, chills, night sweats, weight loss, swollen lymph nodes, body aches,   chest pain, shortness of breath, mood changes.  Positive muscle aches, joint swelling  Objective  Blood pressure 140/82, pulse 68, height 5\' 9"  (1.753 m), weight 284 lb (128.8 kg), SpO2 98 %.    General: No apparent distress alert and oriented x3 mood and affect normal, dressed appropriately.  HEENT: Pupils equal, extraocular movements intact  Respiratory: Patient's speak in full sentences and does not appear short of breath  Cardiovascular: No lower extremity edema, non tender, no erythema  Skin: Warm dry intact with no signs of infection or rash on extremities or on axial skeleton.  Abdomen: Soft nontender  Neuro: Cranial nerves II through XII are intact, neurovascularly intact in all extremities with 2+ DTRs and 2+ pulses.  Lymph: No lymphadenopathy of posterior or anterior cervical chain or axillae bilaterally.  Gait normal with good balance and coordination.  MSK:  Non tender with full range of motion and good stability and  symmetric strength and tone of shoulders, elbows, hip, knee and ankles bilaterally.  Wrist: Right Inspection swelling over the dorsal aspect of the wrist ROM smooth and normal with good flexion and extension and ulnar/radial deviation that is symmetrical with opposite wrist. Palpation severe tenderness over the TFCC No snuffbox tenderness. No tenderness over [place]. Strength 5/5 in all directions without pain. Negative Finkelstein, tinel's and phalens. Positive Watson's test. Contralateral wrist unremarkable   Procedure: Real-time Ultrasound Guided Injection of right TFCC Device: GE Logiq Q7 Ultrasound guided injection is preferred based studies that show increased  duration, increased effect, greater accuracy, decreased procedural pain, increased response rate, and decreased cost with ultrasound guided versus blind injection.  Verbal informed consent obtained.  Time-out conducted.  Noted no overlying erythema, induration, or other signs of local infection.  Skin prepped in a sterile fashion.  Local anesthesia: Topical Ethyl chloride.  With sterile technique and under real time ultrasound guidance: With a 25-gauge half inch needle was injected with 0.5 cc of 0.5% Marcaine and 0.5 cc of Kenalog 40 mg/mL Completed without difficulty  Pain immediately resolved suggesting accurate placement of the medication.  Advised to call if fevers/chills, erythema, induration, drainage, or persistent bleeding.  Images permanently stored and available for review in the ultrasound unit.  Impression: Technically successful ultrasound guided injection.    Impression and Recommendations:     This case required medical decision making of moderate complexity. The above documentation has been reviewed and is accurate and complete Lyndal Pulley, DO       Note: This dictation was prepared with Dragon dictation along with smaller phrase technology. Any transcriptional errors that result from this  process are unintentional.

## 2017-11-08 NOTE — Assessment & Plan Note (Signed)
Patient given injection.  Discussed icing regimen and home exercises.  Discussed bracing.  Do believe that gout is playing a role.  Discussed uric acid and stopped HCTZ rtc in 4 weeks

## 2017-11-08 NOTE — Patient Instructions (Addendum)
Good to see you  Calvin [name] is your friend.  STOP THE HCTZ! Stay active.  Send me a message in 2 weeks tell me how it is doing  Worsening symptoms may need MRI or could send you to hand specialist  See me again in 4-6 weeks

## 2017-11-15 ENCOUNTER — Other Ambulatory Visit: Payer: Self-pay | Admitting: Internal Medicine

## 2017-11-15 ENCOUNTER — Encounter: Payer: Self-pay | Admitting: Family Medicine

## 2017-11-15 DIAGNOSIS — L603 Nail dystrophy: Secondary | ICD-10-CM | POA: Diagnosis not present

## 2017-11-15 DIAGNOSIS — L602 Onychogryphosis: Secondary | ICD-10-CM | POA: Diagnosis not present

## 2017-11-15 DIAGNOSIS — B353 Tinea pedis: Secondary | ICD-10-CM | POA: Diagnosis not present

## 2017-11-19 ENCOUNTER — Other Ambulatory Visit: Payer: Self-pay | Admitting: Internal Medicine

## 2017-11-19 MED ORDER — INDOMETHACIN 50 MG PO CAPS: ORAL_CAPSULE | ORAL | 1 refills | Status: DC

## 2017-11-19 NOTE — Addendum Note (Signed)
Addended by: Terence Lux B on: 11/19/2017 12:06 PM   Modules accepted: Orders

## 2017-11-21 ENCOUNTER — Encounter: Payer: Self-pay | Admitting: Family Medicine

## 2017-11-21 DIAGNOSIS — L603 Nail dystrophy: Secondary | ICD-10-CM | POA: Diagnosis not present

## 2017-12-03 ENCOUNTER — Encounter: Payer: Self-pay | Admitting: Family Medicine

## 2017-12-05 NOTE — Progress Notes (Signed)
[name] [address] [place], [place] [address] Phone: [phone] Subjective:      CC: Wrist pain follow-up  XBD:[id number]  Calvin Ake. is a 67 y.o. male coming in with complaint of wrist and knee pian. Patient has injection in the wrist that helped but did have increase in inflammation in his wrist over the lateral and medial side of the wrist. He was unable to push the signal light with the right hand. Started taking gout medication on Monday. Pain has decreased since then. Patient also used Pennsaid and CBD oil. Patient notes that when he doesn't take indomethacin and is just taking naproxen he notices that his muscles are stiff. Stopped medication 11/20/2017 and noticed by 12/02/2017 that he wasn't able to do as much due to the stiffness.        Past Medical History:  Diagnosis Date  . Allergy   . Arthritis   . Diverticulosis   . Gout   . Hyperlipidemia   . Hypertension   . OSA (obstructive sleep apnea) 11/13/2013  . Sickle cell trait ([facility])   . Sleep apnea    Past Surgical History:  Procedure Laterality Date  . COLONOSCOPY    . POLYPECTOMY    . ROTATOR CUFF REPAIR     left  . THROAT SURGERY     polyps removed from vocal cord   Social History   Socioeconomic History  . Marital status: Married    Spouse name: Not on file  . Number of children: 1  . Years of education: Not on file  . Highest education level: Not on file  Occupational History  . Not on file  Social Needs  . Financial resource strain: Not hard at all  . Food insecurity:    Worry: Never true    Inability: Never true  . Transportation needs:    Medical: No    Non-medical: No  Tobacco Use  . Smoking status: Former Smoker    Packs/day: 2.00    Years: 5.00    Pack years: 10.00    Types: Cigarettes    Last attempt to quit: 05/23/1977    Years since quitting: 40.5  . Smokeless tobacco: Never Used  Substance and Sexual Activity  . Alcohol use: No   Comment: QUIT 2002  . Drug use: No    Comment: Completed program in april 2000  . Sexual activity: Yes  Lifestyle  . Physical activity:    Days per week: 3 days    Minutes per session: 60 min  . Stress: Not at all  Relationships  . Social connections:    Talks on phone: More than three times a week    Gets together: More than three times a week    Attends religious service: More than 4 times per year    Active member of club or organization: Yes    Attends meetings of clubs or organizations: More than 4 times per year    Relationship status: Married  Other Topics Concern  . Not on file  Social History Narrative  . Not on file   No Known Allergies Family History  Problem Relation Age of Onset  . Colon cancer Neg Hx      Current Outpatient Medications (Cardiovascular):  .  irbesartan (AVAPRO) 300 MG tablet, TAKE 1 TABLET BY MOUTH  DAILY .  pravastatin (PRAVACHOL) 40 MG tablet, TAKE 1 TABLET BY MOUTH  DAILY .  sildenafil (VIAGRA) 100 MG tablet,  Take 1 tablet (100 mg total) by mouth as needed for erectile dysfunction. Yearly physical w/labs due in February must see MD for refills .  verapamil (CALAN-SR) 240 MG CR tablet, Take 1 tablet (240 mg total) by mouth at bedtime.  Current Outpatient Medications (Respiratory):  .  fluticasone (FLONASE) 50 MCG/ACT nasal spray, Place 2 sprays into the nose daily. Marland Kitchen  loratadine (CLARITIN) 10 MG tablet, Take 10 mg by mouth daily as needed for allergies.  Current Outpatient Medications (Analgesics):  .  allopurinol (ZYLOPRIM) 300 MG tablet, Take 1 tablet (300 mg total) by mouth daily. Marland Kitchen  aspirin 81 MG tablet, Take 81 mg by mouth daily.   .  colchicine 0.6 MG tablet, take 1 tablet by mouth once daily .  indomethacin (INDOCIN) 50 MG capsule, TAKE 1 CAPSULE BY MOUTH THREE TIMES DAILY AS NEEDED .  nabumetone (RELAFEN) 500 MG tablet, TAKE 1 TABLET(500 MG) BY MOUTH DAILY   Current Outpatient Medications (Other):  .  clotrimazole-betamethasone  (LOTRISONE) cream, Use as directed twice per day as needed to affected area .  Diclofenac Sodium (PENNSAID) 2 % SOLN, Place 4 % onto the skin as needed. .  finasteride (PROSCAR) 5 MG tablet, Take 1 tablet (5 mg total) by mouth daily. Marland Kitchen  glucosamine-chondroitin 500-400 MG tablet, Take 1 tablet by mouth daily.   .  tamsulosin (FLOMAX) 0.4 MG CAPS capsule, Take 1 capsule (0.4 mg total) by mouth daily.    Past medical history, social, surgical and family history all reviewed in electronic medical record.  No pertanent information unless stated regarding to the chief complaint.   Review of Systems:  No headache, visual changes, nausea, vomiting, diarrhea, constipation, dizziness, abdominal pain, skin rash, fevers, chills, night sweats, weight loss, swollen lymph nodes, body aches, joint swelling,  chest pain, shortness of breath, mood changes.  Positive muscle aches  Objective  Blood pressure 134/88, pulse 80, height 5\' 9"  (1.753 m), weight 273 lb (123.8 kg), SpO2 91 %. f    General: No apparent distress alert and oriented x3 mood and affect normal, dressed appropriately.  HEENT: Pupils equal, extraocular movements intact  Respiratory: Patient's speak in full sentences and does not appear short of breath  Cardiovascular: No lower extremity edema, non tender, no erythema  Skin: Warm dry intact with no signs of infection or rash on extremities or on axial skeleton.  Abdomen: Soft nontender  Neuro: Cranial nerves II through XII are intact, neurovascularly intact in all extremities with 2+ DTRs and 2+ pulses.  Lymph: No lymphadenopathy of posterior or anterior cervical chain or axillae bilaterally.  Gait normal with good balance and coordination.  MSK:  Non tender with full range of motion and good stability and symmetric strength and tone of shoulders, elbows, wrist, hip, knee and ankles bilaterally.  Right wrist exam still shows patient has tenderness to palpation and mild swelling over the TFCC.   Patient does have some limited range of motion in all planes especially dorsiflexion of the wrist.  Good grip strength noted.  Mild positive Tinel sign.  Mild swelling with some mild warmness of the joint compared to the contralateral side.   Impression and Recommendations:     This case required medical decision making of moderate complexity. The above documentation has been reviewed and is accurate and complete Lyndal Pulley, DO       Note: This dictation was prepared with Dragon dictation along with smaller phrase technology. Any transcriptional errors that result from this process are  unintentional.

## 2017-12-06 ENCOUNTER — Ambulatory Visit: Payer: Medicare Other | Admitting: Family Medicine

## 2017-12-06 ENCOUNTER — Encounter: Payer: Self-pay | Admitting: Family Medicine

## 2017-12-06 ENCOUNTER — Other Ambulatory Visit: Payer: Self-pay | Admitting: Family Medicine

## 2017-12-06 DIAGNOSIS — M24131 Other articular cartilage disorders, right wrist: Secondary | ICD-10-CM

## 2017-12-06 DIAGNOSIS — M1A0620 Idiopathic chronic gout, left knee, without tophus (tophi): Secondary | ICD-10-CM | POA: Diagnosis not present

## 2017-12-06 DIAGNOSIS — G5601 Carpal tunnel syndrome, right upper limb: Secondary | ICD-10-CM | POA: Diagnosis not present

## 2017-12-06 DIAGNOSIS — L602 Onychogryphosis: Secondary | ICD-10-CM | POA: Diagnosis not present

## 2017-12-06 MED ORDER — NABUMETONE 500 MG PO TABS: 500.0000 mg | ORAL_TABLET | Freq: Every day | ORAL | 1 refills | Status: DC

## 2017-12-06 NOTE — Assessment & Plan Note (Signed)
Believe the patient does need to take colchicine on a more regular basis.  Patient will try taking 1 daily.  Patient is concerned with the cost restriction.

## 2017-12-06 NOTE — Patient Instructions (Signed)
Good to see you  Cholchicine 1/2 tab daily  Stop the naproxen  Start releafen daily and see if that helps Continue the allopurionl  Try giving blood and lets see how it goes.  Can do PRP but hope it helps Send me a message in a month and tell me how it goes

## 2017-12-06 NOTE — Telephone Encounter (Signed)
Refill done.  

## 2017-12-06 NOTE — Assessment & Plan Note (Signed)
Stable.  Mild positive Tinel's and likely not contributing.

## 2017-12-06 NOTE — Assessment & Plan Note (Signed)
Patient does have a tear.  The injection did help.  Still moderate improvement but unfortunately continues to have pain.  I do believe that underlying gout is likely contributing as well.  Discussed icing regimen and home exercises.  Discussed which activities to do which wants to avoid.  Patient's given the choice for the possibility of PRP.  Follow-up again in 4 to 6 weeks

## 2017-12-14 ENCOUNTER — Encounter: Payer: Self-pay | Admitting: Family Medicine

## 2017-12-15 NOTE — Progress Notes (Signed)
[name] [address] [place], [place] [address] Phone: [phone] Subjective:   [name], am serving as a scribe for Dr. Hulan Saas.   CC: Bilateral knee pain  HYW:[id number]  [name]. is a 67 y.o. male coming in with complaint of right knee pain. He was having trouble bending his knee for one week. Has been taking medication and range of motion seems to have gotten better. Deep knee bending and stair climbing cause pain in the front of the knee. Would like to go visit his daughter but has to climb 3 flights of stairs.  Patient has known arthritic changes of the knees as well as gout.  Patient feels like the new changes we made to his gout medications including stopping hydrochlorthiazide has helped out with that type of aching discomfort.    Past Medical History:  Diagnosis Date  . Allergy   . Arthritis   . Diverticulosis   . Gout   . Hyperlipidemia   . Hypertension   . OSA (obstructive sleep apnea) 11/13/2013  . Sickle cell trait ([place])   . Sleep apnea    Past Surgical History:  Procedure Laterality Date  . COLONOSCOPY    . POLYPECTOMY    . ROTATOR CUFF REPAIR     left  . THROAT SURGERY     polyps removed from vocal cord   Social History   Socioeconomic History  . Marital status: Married    Spouse name: Not on file  . Number of children: 1  . Years of education: Not on file  . Highest education level: Not on file  Occupational History  . Not on file  Social Needs  . Financial resource strain: Not hard at all  . Food insecurity:    Worry: Never true    Inability: Never true  . Transportation needs:    Medical: No    Non-medical: No  Tobacco Use  . Smoking status: Former Smoker    Packs/day: 2.00    Years: 5.00    Pack years: 10.00    Types: Cigarettes    Last attempt to quit: 05/23/1977    Years since quitting: 40.5  . Smokeless tobacco: Never Used  Substance and Sexual Activity  . Alcohol use: No    Comment: QUIT 2002  . Drug use: No    Comment: Completed program in april 2000  . Sexual activity: Yes  Lifestyle  . Physical activity:    Days per week: 3 days    Minutes per session: 60 min  . Stress: Not at all  Relationships  . Social connections:    Talks on phone: More than three times a week    Gets together: More than three times a week    Attends religious service: More than 4 times per year    Active member of club or organization: Yes    Attends meetings of clubs or organizations: More than 4 times per year    Relationship status: Married  Other Topics Concern  . Not on file  Social History Narrative  . Not on file   No Known Allergies Family History  Problem Relation Age of Onset  . Colon cancer Neg Hx      Current Outpatient Medications (Cardiovascular):  .  irbesartan (AVAPRO) 300 MG tablet, TAKE 1 TABLET BY MOUTH  DAILY .  pravastatin (PRAVACHOL) 40 MG tablet, TAKE 1 TABLET BY MOUTH  DAILY .  sildenafil (VIAGRA) 100  MG tablet, Take 1 tablet (100 mg total) by mouth as needed for erectile dysfunction. Yearly physical w/labs due in February must see MD for refills .  verapamil (CALAN-SR) 240 MG CR tablet, Take 1 tablet (240 mg total) by mouth at bedtime.  Current Outpatient Medications (Respiratory):  .  fluticasone (FLONASE) 50 MCG/ACT nasal spray, Place 2 sprays into the nose daily. Marland Kitchen  loratadine (CLARITIN) 10 MG tablet, Take 10 mg by mouth daily as needed for allergies.  Current Outpatient Medications (Analgesics):  .  allopurinol (ZYLOPRIM) 300 MG tablet, Take 1 tablet (300 mg total) by mouth daily. Marland Kitchen  aspirin 81 MG tablet, Take 81 mg by mouth daily.   .  colchicine 0.6 MG tablet, take 1 tablet by mouth once daily .  indomethacin (INDOCIN) 50 MG capsule, TAKE 1 CAPSULE BY MOUTH THREE TIMES DAILY AS NEEDED .  nabumetone (RELAFEN) 500 MG tablet, TAKE 1 TABLET(500 MG) BY MOUTH DAILY   Current Outpatient Medications (Other):  .  clotrimazole-betamethasone  (LOTRISONE) cream, Use as directed twice per day as needed to affected area .  Diclofenac Sodium (PENNSAID) 2 % SOLN, Place 4 % onto the skin as needed. .  finasteride (PROSCAR) 5 MG tablet, Take 1 tablet (5 mg total) by mouth daily. Marland Kitchen  glucosamine-chondroitin 500-400 MG tablet, Take 1 tablet by mouth daily.   .  tamsulosin (FLOMAX) 0.4 MG CAPS capsule, Take 1 capsule (0.4 mg total) by mouth daily.    Past medical history, social, surgical and family history all reviewed in electronic medical record.  No pertanent information unless stated regarding to the chief complaint.   Review of Systems:  No headache, visual changes, nausea, vomiting, diarrhea, constipation, dizziness, abdominal pain, skin rash, fevers, chills, night sweats, weight loss, swollen lymph nodes, , muscle aches, chest pain, shortness of breath, mood changes.  Positive muscle aches, joint swelling  Objective  Blood pressure 138/84, pulse 82, height 5\' 9"  (1.753 m), weight 274 lb (124.3 kg), SpO2 98 %.  General: No apparent distress alert and oriented x3 mood and affect normal, dressed appropriately.  HEENT: Pupils equal, extraocular movements intact  Respiratory: Patient's speak in full sentences and does not appear short of breath  Cardiovascular: No lower extremity edema, non tender, no erythema  Skin: Warm dry intact with no signs of infection or rash on extremities or on axial skeleton.  Abdomen: Soft nontender  Neuro: Cranial nerves II through XII are intact, neurovascularly intact in all extremities with 2+ DTRs and 2+ pulses.  Lymph: No lymphadenopathy of posterior or anterior cervical chain or axillae bilaterally.  Gait antalgic MSK: Mild diffuse tender with full range of motion and good stability and symmetric strength and tone of shoulders, elbows, wrist, hip, knee and ankles bilaterally.   Knee: Bilateral valgus deformity noted. Large thigh to calf ratio.  Tender to palpation over medial and PF joint line.    ROM full in flexion and extension and lower leg rotation. instability with valgus force.  painful patellar compression. Patellar glide with moderate crepitus. Patellar and quadriceps tendons unremarkable. Hamstring and quadriceps strength is normal.   After informed written and verbal consent, patient was seated on exam table. Right knee was prepped with alcohol swab and utilizing anterolateral approach, patient's right knee space was injected with 4:1  marcaine 0.5%: Kenalog 40mg /dL. Patient tolerated the procedure well without immediate complications.  After informed written and verbal consent, patient was seated on exam table. Left knee was prepped with alcohol swab and utilizing anterolateral  approach, patient's left knee space was injected with 4:1  marcaine 0.5%: Kenalog 40mg /dL. Patient tolerated the procedure well without immediate complications.    Impression and Recommendations:     This case required medical decision making of moderate complexity. The above documentation has been reviewed and is accurate and complete Lyndal Pulley, DO       Note: This dictation was prepared with Dragon dictation along with smaller phrase technology. Any transcriptional errors that result from this process are unintentional.

## 2017-12-17 ENCOUNTER — Ambulatory Visit: Payer: Medicare Other | Admitting: Family Medicine

## 2017-12-17 ENCOUNTER — Encounter: Payer: Self-pay | Admitting: Family Medicine

## 2017-12-17 DIAGNOSIS — M170 Bilateral primary osteoarthritis of knee: Secondary | ICD-10-CM | POA: Diagnosis not present

## 2017-12-17 NOTE — Assessment & Plan Note (Signed)
Bilateral injections given today.  Discussed icing regimen and home exercise.  Discussed which activities to do which wants to avoid.  Increase activity slowly over the course the next several days.  Discussed icing regimen.  Follow-up again in 4 to 8 weeks patient could be candidate for Visco supplementation.

## 2017-12-17 NOTE — Patient Instructions (Addendum)
Great to see you again  Injected the knees today  Please stay on the medicine and stay off the HCTZ  You should get better  Ice is your friend Have a great trip  Continue the colchicine at 1/2 tab daily  See me again in 4 weeks

## 2018-01-03 ENCOUNTER — Encounter: Payer: Self-pay | Admitting: Family Medicine

## 2018-01-08 ENCOUNTER — Other Ambulatory Visit: Payer: Self-pay | Admitting: Internal Medicine

## 2018-01-15 NOTE — Progress Notes (Signed)
[name] [place] [place], [place] [address] Phone: [phone] Subjective:     CC: Knee and wrist pain.  FTD:[id number]  Calvin [name]. is a 67 y.o. male coming in with complaint of knee and wrist pain. He does feel that both areas have improved since last visit. He states that he was able to climb stairs without pain. Is using Pennsaid for flare ups.  Patient states doing significantly better at this point.     Past Medical History:  Diagnosis Date  . Allergy   . Arthritis   . Diverticulosis   . Gout   . Hyperlipidemia   . Hypertension   . OSA (obstructive sleep apnea) 11/13/2013  . Sickle cell trait ([place])   . Sleep apnea    Past Surgical History:  Procedure Laterality Date  . COLONOSCOPY    . POLYPECTOMY    . ROTATOR CUFF REPAIR     left  . THROAT SURGERY     polyps removed from vocal cord   Social History   Socioeconomic History  . Marital status: Married    Spouse name: Not on file  . Number of children: 1  . Years of education: Not on file  . Highest education level: Not on file  Occupational History  . Not on file  Social Needs  . Financial resource strain: Not hard at all  . Food insecurity:    Worry: Never true    Inability: Never true  . Transportation needs:    Medical: No    Non-medical: No  Tobacco Use  . Smoking status: Former Smoker    Packs/day: 2.00    Years: 5.00    Pack years: 10.00    Types: Cigarettes    Last attempt to quit: 05/23/1977    Years since quitting: 40.6  . Smokeless tobacco: Never Used  Substance and Sexual Activity  . Alcohol use: No    Comment: QUIT 2002  . Drug use: No    Comment: Completed program in april 2000  . Sexual activity: Yes  Lifestyle  . Physical activity:    Days per week: 3 days    Minutes per session: 60 min  . Stress: Not at all  Relationships  . Social connections:    Talks on phone: More than three times a week    Gets together: More than three  times a week    Attends religious service: More than 4 times per year    Active member of club or organization: Yes    Attends meetings of clubs or organizations: More than 4 times per year    Relationship status: Married  Other Topics Concern  . Not on file  Social History Narrative  . Not on file   No Known Allergies Family History  Problem Relation Age of Onset  . Colon cancer Neg Hx      Current Outpatient Medications (Cardiovascular):  .  irbesartan (AVAPRO) 300 MG tablet, TAKE 1 TABLET BY MOUTH  DAILY .  pravastatin (PRAVACHOL) 40 MG tablet, TAKE 1 TABLET BY MOUTH  DAILY .  sildenafil (VIAGRA) 100 MG tablet, Take 1 tablet (100 mg total) by mouth as needed for erectile dysfunction. Yearly physical w/labs due in February must see MD for refills .  verapamil (CALAN-SR) 240 MG CR tablet, Take 1 tablet (240 mg total) by mouth at bedtime.  Current Outpatient Medications (Respiratory):  .  fluticasone (FLONASE) 50 MCG/ACT nasal spray, Place 2 sprays  into the nose daily. Marland Kitchen  loratadine (CLARITIN) 10 MG tablet, Take 10 mg by mouth daily as needed for allergies.  Current Outpatient Medications (Analgesics):  .  allopurinol (ZYLOPRIM) 300 MG tablet, Take 1 tablet (300 mg total) by mouth daily. Marland Kitchen  aspirin 81 MG tablet, Take 81 mg by mouth daily.   .  colchicine 0.6 MG tablet, TAKE 1 TABLET BY MOUTH ONCE DAILY .  indomethacin (INDOCIN) 50 MG capsule, TAKE 1 CAPSULE BY MOUTH THREE TIMES DAILY AS NEEDED .  nabumetone (RELAFEN) 500 MG tablet, TAKE 1 TABLET(500 MG) BY MOUTH DAILY   Current Outpatient Medications (Other):  .  clotrimazole-betamethasone (LOTRISONE) cream, Use as directed twice per day as needed to affected area .  Diclofenac Sodium (PENNSAID) 2 % SOLN, Place 4 % onto the skin as needed. .  finasteride (PROSCAR) 5 MG tablet, Take 1 tablet (5 mg total) by mouth daily. Marland Kitchen  glucosamine-chondroitin 500-400 MG tablet, Take 1 tablet by mouth daily.   .  tamsulosin (FLOMAX) 0.4 MG  CAPS capsule, Take 1 capsule (0.4 mg total) by mouth daily.    Past medical history, social, surgical and family history all reviewed in electronic medical record.  No pertanent information unless stated regarding to the chief complaint.   Review of Systems:  No headache, visual changes, nausea, vomiting, diarrhea, constipation, dizziness, abdominal pain, skin rash, fevers, chills, night sweats, weight loss, swollen lymph nodes, body aches, joint swelling,chest pain, shortness of breath, mood changes.  Positive muscle aches  Objective  Blood pressure 128/82, pulse 82, height 5\' 9"  (1.753 m), weight 269 lb (122 kg), SpO2 98 %.   General: No apparent distress alert and oriented x3 mood and affect normal, dressed appropriately.  HEENT: Pupils equal, extraocular movements intact  Respiratory: Patient's speak in full sentences and does not appear short of breath  Cardiovascular: No lower extremity edema, non tender, no erythema  Skin: Warm dry intact with no signs of infection or rash on extremities or on axial skeleton.  Abdomen: Soft nontender  Neuro: Cranial nerves II through XII are intact, neurovascularly intact in all extremities with 2+ DTRs and 2+ pulses.  Lymph: No lymphadenopathy of posterior or anterior cervical chain or axillae bilaterally.  Gait normal with good balance and coordination.  MSK:  Non tender with full range of motion and good stability and symmetric strength and tone of shoulders, elbows, wrist, hip, and ankles bilaterally.  Mild arthritic changes of multiple joints Patient's bilateral knees exam arthritic changes but no significant instability noted today.  Significant less swelling than previous exam as well.    Impression and Recommendations:     The above documentation has been reviewed and is accurate and complete Lyndal Pulley, DO       Note: This dictation was prepared with Dragon dictation along with smaller phrase technology. Any transcriptional  errors that result from this process are unintentional.

## 2018-01-16 ENCOUNTER — Ambulatory Visit: Payer: Medicare Other | Admitting: Family Medicine

## 2018-01-16 DIAGNOSIS — M170 Bilateral primary osteoarthritis of knee: Secondary | ICD-10-CM

## 2018-01-16 DIAGNOSIS — M1A0620 Idiopathic chronic gout, left knee, without tophus (tophi): Secondary | ICD-10-CM | POA: Diagnosis not present

## 2018-01-16 NOTE — Assessment & Plan Note (Signed)
Much improved.  Encourage patient to stay off the hydrochlorothiazide.  Patient's blood pressure well controlled.  Follow-up again as needed

## 2018-01-16 NOTE — Assessment & Plan Note (Signed)
Stable.  Doing well after the injections.  Did not need to start Visco supplementation at this time per patient.  Follow-up as needed

## 2018-01-29 ENCOUNTER — Encounter: Payer: Self-pay | Admitting: Family Medicine

## 2018-01-29 ENCOUNTER — Encounter: Payer: Self-pay | Admitting: Internal Medicine

## 2018-01-29 MED ORDER — NABUMETONE 500 MG PO TABS: ORAL_TABLET | ORAL | 0 refills | Status: DC

## 2018-01-29 MED ORDER — NABUMETONE 500 MG PO TABS: ORAL_TABLET | ORAL | 3 refills | Status: DC

## 2018-01-29 NOTE — Addendum Note (Signed)
Addended by: Lyndal Pulley on: 01/29/2018 02:35 PM   Modules accepted: Orders

## 2018-02-04 DIAGNOSIS — G4733 Obstructive sleep apnea (adult) (pediatric): Secondary | ICD-10-CM | POA: Diagnosis not present

## 2018-02-05 ENCOUNTER — Other Ambulatory Visit: Payer: Self-pay | Admitting: *Deleted

## 2018-02-05 MED ORDER — NABUMETONE 500 MG PO TABS: ORAL_TABLET | ORAL | 3 refills | Status: DC

## 2018-02-05 NOTE — Telephone Encounter (Signed)
Resent rx into correct pharmacy.

## 2018-02-13 ENCOUNTER — Encounter: Payer: Self-pay | Admitting: Internal Medicine

## 2018-02-14 NOTE — Telephone Encounter (Signed)
BP Readings from Last 3 Encounters:  01/16/18 128/82  12/17/17 138/84  12/06/17 134/88   Ok for note - Done hardcopy to [employer]

## 2018-02-22 DIAGNOSIS — H524 Presbyopia: Secondary | ICD-10-CM | POA: Diagnosis not present

## 2018-03-04 DIAGNOSIS — G4733 Obstructive sleep apnea (adult) (pediatric): Secondary | ICD-10-CM | POA: Diagnosis not present

## 2018-04-29 ENCOUNTER — Encounter: Payer: Self-pay | Admitting: Family Medicine

## 2018-04-29 ENCOUNTER — Telehealth: Payer: Self-pay | Admitting: Emergency Medicine

## 2018-04-29 DIAGNOSIS — R739 Hyperglycemia, unspecified: Secondary | ICD-10-CM

## 2018-04-29 DIAGNOSIS — Z0000 Encounter for general adult medical examination without abnormal findings: Secondary | ICD-10-CM

## 2018-04-29 NOTE — Telephone Encounter (Signed)
Done

## 2018-04-29 NOTE — Telephone Encounter (Signed)
Copied from [place] [id number]. Topic: General - Other >> Apr 29, 2018 10:18 AM Judyann Munson wrote: Reason for CRM: patient is calling to request  pre order labs  for his physical that is schedule 05-09-2018 with Dr.  Jenny Reichmann. Please advise

## 2018-05-01 ENCOUNTER — Other Ambulatory Visit (INDEPENDENT_AMBULATORY_CARE_PROVIDER_SITE_OTHER): Payer: Medicare Other

## 2018-05-01 DIAGNOSIS — R739 Hyperglycemia, unspecified: Secondary | ICD-10-CM | POA: Diagnosis not present

## 2018-05-01 DIAGNOSIS — Z0000 Encounter for general adult medical examination without abnormal findings: Secondary | ICD-10-CM | POA: Diagnosis not present

## 2018-05-01 LAB — BASIC METABOLIC PANEL
BUN: 14 mg/dL (ref 6–23)
CO2: 24 mEq/L (ref 19–32)
Calcium: 9.1 mg/dL (ref 8.4–10.5)
Chloride: 108 mEq/L (ref 96–112)
Creatinine, Ser: 1.22 mg/dL (ref 0.40–1.50)
GFR: 71.12 mL/min (ref >60.00)
Glucose, Bld: 77 mg/dL (ref 70–99)
Potassium: 3.9 mEq/L (ref 3.5–5.1)
Sodium: 144 mEq/L (ref 135–145)

## 2018-05-01 LAB — TSH: TSH: 1.23 u[IU]/mL (ref 0.35–4.50)

## 2018-05-01 LAB — LIPID PANEL
Cholesterol: 131 mg/dL (ref 0–200)
HDL: 23.3 mg/dL — AB (ref >39.00)
LDL Cholesterol: 84 mg/dL (ref 0–99)
NonHDL: 107.71
Total CHOL/HDL Ratio: 6
Triglycerides: 117 mg/dL (ref 0.0–149.0)
VLDL: 23.4 mg/dL (ref 0.0–40.0)

## 2018-05-01 LAB — URINALYSIS, ROUTINE W REFLEX MICROSCOPIC
Bilirubin Urine: NEGATIVE
Hgb urine dipstick: NEGATIVE
Ketones, ur: NEGATIVE
Leukocytes,Ua: NEGATIVE
Nitrite: NEGATIVE
RBC / HPF: NONE SEEN (ref 0–?)
Specific Gravity, Urine: 1.01 (ref 1.000–1.030)
Total Protein, Urine: NEGATIVE
Urine Glucose: NEGATIVE
Urobilinogen, UA: 0.2 (ref 0.0–1.0)
pH: 6.5 (ref 5.0–8.0)

## 2018-05-01 LAB — CBC WITH DIFFERENTIAL/PLATELET
Basophils Absolute: 0 10*3/uL (ref 0.0–0.1)
Basophils Relative: 1 % (ref 0.0–3.0)
Eosinophils Absolute: 0 10*3/uL (ref 0.0–0.7)
Eosinophils Relative: 0.9 % (ref 0.0–5.0)
HCT: 40 % (ref 39.0–52.0)
Hemoglobin: 13.5 g/dL (ref 13.0–17.0)
Lymphocytes Relative: 40.9 % (ref 12.0–46.0)
Lymphs Abs: 1.5 10*3/uL (ref 0.7–4.0)
MCHC: 33.6 g/dL (ref 30.0–36.0)
MCV: 89.2 fl (ref 78.0–100.0)
Monocytes Absolute: 0.3 10*3/uL (ref 0.1–1.0)
Monocytes Relative: 8 % (ref 3.0–12.0)
Neutro Abs: 1.9 10*3/uL (ref 1.4–7.7)
Neutrophils Relative %: 49.2 % (ref 43.0–77.0)
PLATELETS: 88 10*3/uL — AB (ref 150.0–400.0)
RBC: 4.49 Mil/uL (ref 4.22–5.81)
RDW: 15.7 % — AB (ref 11.5–15.5)
WBC: 3.8 10*3/uL — ABNORMAL LOW (ref 4.0–10.5)

## 2018-05-01 LAB — HEPATIC FUNCTION PANEL
ALT: 20 U/L (ref 0–53)
AST: 18 U/L (ref 0–37)
Albumin: 4.3 g/dL (ref 3.5–5.2)
Alkaline Phosphatase: 71 U/L (ref 39–117)
Bilirubin, Direct: 0.1 mg/dL (ref 0.0–0.3)
Total Bilirubin: 0.6 mg/dL (ref 0.2–1.2)
Total Protein: 7 g/dL (ref 6.0–8.3)

## 2018-05-01 LAB — PSA: PSA: 3.24 ng/mL (ref 0.10–4.00)

## 2018-05-01 LAB — HEMOGLOBIN A1C: Hgb A1c MFr Bld: 5.3 % (ref 4.6–6.5)

## 2018-05-07 DIAGNOSIS — G4733 Obstructive sleep apnea (adult) (pediatric): Secondary | ICD-10-CM | POA: Diagnosis not present

## 2018-05-08 ENCOUNTER — Other Ambulatory Visit: Payer: Self-pay | Admitting: *Deleted

## 2018-05-08 ENCOUNTER — Encounter: Payer: Self-pay | Admitting: Family Medicine

## 2018-05-08 MED ORDER — NABUMETONE 500 MG PO TABS: ORAL_TABLET | ORAL | 1 refills | Status: DC

## 2018-05-09 ENCOUNTER — Encounter: Payer: Self-pay | Admitting: Internal Medicine

## 2018-05-09 ENCOUNTER — Other Ambulatory Visit: Payer: Medicare Other

## 2018-05-09 ENCOUNTER — Ambulatory Visit (INDEPENDENT_AMBULATORY_CARE_PROVIDER_SITE_OTHER): Payer: Medicare Other | Admitting: Internal Medicine

## 2018-05-09 ENCOUNTER — Other Ambulatory Visit (INDEPENDENT_AMBULATORY_CARE_PROVIDER_SITE_OTHER): Payer: Medicare Other

## 2018-05-09 VITALS — BP 152/92 | HR 69 | Temp 97.9°F | Ht 69.0 in | Wt 269.0 lb

## 2018-05-09 DIAGNOSIS — Z23 Encounter for immunization: Secondary | ICD-10-CM | POA: Diagnosis not present

## 2018-05-09 DIAGNOSIS — R739 Hyperglycemia, unspecified: Secondary | ICD-10-CM

## 2018-05-09 DIAGNOSIS — Z0000 Encounter for general adult medical examination without abnormal findings: Secondary | ICD-10-CM | POA: Diagnosis not present

## 2018-05-09 DIAGNOSIS — D696 Thrombocytopenia, unspecified: Secondary | ICD-10-CM | POA: Diagnosis not present

## 2018-05-09 LAB — CBC WITH DIFFERENTIAL/PLATELET
BASOS PCT: 0.4 % (ref 0.0–3.0)
Basophils Absolute: 0 10*3/uL (ref 0.0–0.1)
Eosinophils Absolute: 0 10*3/uL (ref 0.0–0.7)
Eosinophils Relative: 1 % (ref 0.0–5.0)
HEMATOCRIT: 38.8 % — AB (ref 39.0–52.0)
Hemoglobin: 13.2 g/dL (ref 13.0–17.0)
Lymphocytes Relative: 24.5 % (ref 12.0–46.0)
Lymphs Abs: 1.1 10*3/uL (ref 0.7–4.0)
MCHC: 34.2 g/dL (ref 30.0–36.0)
MCV: 89.9 fl (ref 78.0–100.0)
Monocytes Absolute: 0.4 10*3/uL (ref 0.1–1.0)
Monocytes Relative: 8.1 % (ref 3.0–12.0)
Neutro Abs: 2.9 10*3/uL (ref 1.4–7.7)
Neutrophils Relative %: 66 % (ref 43.0–77.0)
Platelets: 76 10*3/uL — ABNORMAL LOW (ref 150.0–400.0)
RBC: 4.31 Mil/uL (ref 4.22–5.81)
RDW: 15.4 % (ref 11.5–15.5)
WBC: 4.4 10*3/uL (ref 4.0–10.5)

## 2018-05-09 LAB — HEPATIC FUNCTION PANEL
ALT: 17 U/L (ref 0–53)
AST: 20 U/L (ref 0–37)
Albumin: 4.3 g/dL (ref 3.5–5.2)
Alkaline Phosphatase: 77 U/L (ref 39–117)
BILIRUBIN TOTAL: 0.7 mg/dL (ref 0.2–1.2)
Bilirubin, Direct: 0.2 mg/dL (ref 0.0–0.3)
Total Protein: 6.9 g/dL (ref 6.0–8.3)

## 2018-05-09 LAB — LIPID PANEL
Cholesterol: 115 mg/dL (ref 0–200)
HDL: 25.2 mg/dL — ABNORMAL LOW (ref >39.00)
LDL CALC: 64 mg/dL (ref 0–99)
NONHDL: 89.54
Total CHOL/HDL Ratio: 5
Triglycerides: 128 mg/dL (ref 0.0–149.0)
VLDL: 25.6 mg/dL (ref 0.0–40.0)

## 2018-05-09 LAB — BASIC METABOLIC PANEL
BUN: 11 mg/dL (ref 6–23)
CO2: 29 mEq/L (ref 19–32)
Calcium: 8.8 mg/dL (ref 8.4–10.5)
Chloride: 108 mEq/L (ref 96–112)
Creatinine, Ser: 1.18 mg/dL (ref 0.40–1.50)
GFR: 73.9 mL/min (ref >60.00)
Glucose, Bld: 88 mg/dL (ref 70–99)
Potassium: 3.6 mEq/L (ref 3.5–5.1)
Sodium: 144 mEq/L (ref 135–145)

## 2018-05-09 LAB — URINALYSIS, ROUTINE W REFLEX MICROSCOPIC
Bilirubin Urine: NEGATIVE
Hgb urine dipstick: NEGATIVE
Ketones, ur: NEGATIVE
Leukocytes,Ua: NEGATIVE
Nitrite: NEGATIVE
PH: 6 (ref 5.0–8.0)
RBC / HPF: NONE SEEN (ref 0–?)
Specific Gravity, Urine: 1.025 (ref 1.000–1.030)
TOTAL PROTEIN, URINE-UPE24: NEGATIVE
UROBILINOGEN UA: 0.2 (ref 0.0–1.0)
Urine Glucose: NEGATIVE

## 2018-05-09 LAB — PSA: PSA: 2.17 ng/mL (ref 0.10–4.00)

## 2018-05-09 LAB — TSH: TSH: 1.14 u[IU]/mL (ref 0.35–4.50)

## 2018-05-09 LAB — HEMOGLOBIN A1C: Hgb A1c MFr Bld: 5.3 % (ref 4.6–6.5)

## 2018-05-09 MED ORDER — PRAVASTATIN SODIUM 40 MG PO TABS: 40.0000 mg | ORAL_TABLET | Freq: Every day | ORAL | 2 refills | Status: DC

## 2018-05-09 MED ORDER — ALLOPURINOL 300 MG PO TABS: 300.0000 mg | ORAL_TABLET | Freq: Every day | ORAL | 3 refills | Status: DC

## 2018-05-09 MED ORDER — VERAPAMIL HCL ER 240 MG PO TBCR: 240.0000 mg | EXTENDED_RELEASE_TABLET | Freq: Every day | ORAL | 2 refills | Status: DC

## 2018-05-09 MED ORDER — IRBESARTAN 300 MG PO TABS: 300.0000 mg | ORAL_TABLET | Freq: Every day | ORAL | 2 refills | Status: DC

## 2018-05-09 MED ORDER — FINASTERIDE 5 MG PO TABS: 5.0000 mg | ORAL_TABLET | Freq: Every day | ORAL | 3 refills | Status: DC

## 2018-05-09 NOTE — Assessment & Plan Note (Signed)
stable overall by history and exam, recent data reviewed with pt, and pt to continue medical treatment as before,  to f/u any worsening symptoms or concerns  

## 2018-05-09 NOTE — Addendum Note (Signed)
Addended by: Juliet Rude on: 05/09/2018 10:25 AM   Modules accepted: Orders

## 2018-05-09 NOTE — Patient Instructions (Addendum)
You had the Tdap tetanus shot today  Please continue all other medications as before, and refills have been done if requested.  Please have the pharmacy call with any other refills you may need.  Please continue your efforts at being more active, low cholesterol diet, and weight control.  You are otherwise up to date with prevention measures today.  Please keep your appointments with your specialists as you may have planned  You will be contacted regarding the referral for: Hematology (blood doctor)  Please go to the LAB in the Basement (turn left off the elevator) for the tests to be done today  Please also return in about 1 week to repeat the platelet count  You will be contacted by phone if any changes need to be made immediately.  Otherwise, you will receive a letter about your results with an explanation, but please check with MyChart first.  Please remember to sign up for MyChart if you have not done so, as this will be important to you in the future with finding out test results, communicating by private email, and scheduling acute appointments online when needed.  Please return in 6 months, or sooner if needed, with Lab testing done 3-5 days before

## 2018-05-09 NOTE — Progress Notes (Signed)
Subjective:    Patient ID: Calvin [name]., male    DOB: 1950-12-09, 68 y.o.   MRN: [id number]  HPI  Here for wellness and f/u;  Overall doing ok;  Pt denies Chest pain, worsening SOB, DOE, wheezing, orthopnea, PND, worsening LE edema, palpitations, dizziness or syncope.  Pt denies neurological change such as new headache, facial or extremity weakness.  Pt denies polydipsia, polyuria, or low sugar symptoms. Pt states overall good compliance with treatment and medications, good tolerability, and has been trying to follow appropriate diet.  Pt denies worsening depressive symptoms, suicidal ideation or panic. No fever, night sweats, wt loss, loss of appetite, or other constitutional symptoms.  Pt states good ability with ADL's, has low fall risk, home safety reviewed and adequate, no other significant changes in hearing or vision, and only occasionally active with exercise. Leaving for [place] next wed for 2 wks motorcycle trip and rental house.  Has low plt on labs today, no overt bruising or bleeding.  Past Medical History:  Diagnosis Date  . Allergy   . Arthritis   . Diverticulosis   . Gout   . Hyperlipidemia   . Hypertension   . OSA (obstructive sleep apnea) 11/13/2013  . Sickle cell trait ([place])   . Sleep apnea    Past Surgical History:  Procedure Laterality Date  . COLONOSCOPY    . POLYPECTOMY    . ROTATOR CUFF REPAIR     left  . THROAT SURGERY     polyps removed from vocal cord    reports that he quit smoking about 40 years ago. His smoking use included cigarettes. He has a 10.00 pack-year smoking history. He has never used smokeless tobacco. He reports that he does not drink alcohol or use drugs. family history is not on file. No Known Allergies Current Outpatient Medications on File Prior to Visit  Medication Sig Dispense Refill  . aspirin 81 MG tablet Take 81 mg by mouth daily.      . colchicine 0.6 MG tablet TAKE 1 TABLET BY MOUTH ONCE DAILY 90 tablet 0  . fluticasone  (FLONASE) 50 MCG/ACT nasal spray Place 2 sprays into the nose daily. 48 g 3  . glucosamine-chondroitin 500-400 MG tablet Take 1 tablet by mouth daily.      . indomethacin (INDOCIN) 50 MG capsule TAKE 1 CAPSULE BY MOUTH THREE TIMES DAILY AS NEEDED 270 capsule 0  . loratadine (CLARITIN) 10 MG tablet Take 10 mg by mouth daily as needed for allergies.    . nabumetone (RELAFEN) 500 MG tablet TAKE 1 TABLET(500 MG) BY MOUTH DAILY 90 tablet 1  . sildenafil (VIAGRA) 100 MG tablet Take 1 tablet (100 mg total) by mouth as needed for erectile dysfunction. Yearly physical w/labs due in February must see MD for refills 10 tablet 0  . tamsulosin (FLOMAX) 0.4 MG CAPS capsule Take 1 capsule (0.4 mg total) by mouth daily. 90 capsule 1   No current facility-administered medications on file prior to visit.    Review of Systems Constitutional: Negative for other unusual diaphoresis, sweats, appetite or weight changes HENT: Negative for other worsening hearing loss, ear pain, facial swelling, mouth sores or neck stiffness.   Eyes: Negative for other worsening pain, redness or other visual disturbance.  Respiratory: Negative for other stridor or swelling Cardiovascular: Negative for other palpitations or other chest pain  Gastrointestinal: Negative for worsening diarrhea or loose stools, blood in stool, distention or other pain Genitourinary: Negative for hematuria, flank pain  or other change in urine volume.  Musculoskeletal: Negative for myalgias or other joint swelling.  Skin: Negative for other color change, or other wound or worsening drainage.  Neurological: Negative for other syncope or numbness. Hematological: Negative for other adenopathy or swelling Psychiatric/Behavioral: Negative for hallucinations, other worsening agitation, SI, self-injury, or new decreased concentration All other system neg per pt   Objective:   Physical Exam BP (!) 152/92 Comment: meds not taken yet  Pulse 69   Temp 97.9 F  (36.6 C) (Oral)   Ht 5\' 9"  (1.753 m)   Wt 269 lb (122 kg)   SpO2 97%   BMI 39.72 kg/m  VS noted,  Constitutional: Pt is oriented to person, place, and time. Appears well-developed and well-nourished, in no significant distress and comfortable Head: Normocephalic and atraumatic  Eyes: Conjunctivae and EOM are normal. Pupils are equal, round, and reactive to light Right Ear: External ear normal without discharge Left Ear: External ear normal without discharge Nose: Nose without discharge or deformity Mouth/Throat: Oropharynx is without other ulcerations and moist  Neck: Normal range of motion. Neck supple. No JVD present. No tracheal deviation present or significant neck LA or mass Cardiovascular: Normal rate, regular rhythm, normal heart sounds and intact distal pulses.   Pulmonary/Chest: WOB normal and breath sounds without rales or wheezing  Abdominal: Soft. Bowel sounds are normal. NT. No HSM  Musculoskeletal: Normal range of motion. Exhibits no edema Lymphadenopathy: Has no other cervical adenopathy.  Neurological: Pt is alert and oriented to person, place, and time. Pt has normal reflexes. No cranial nerve deficit. Motor grossly intact, Gait intact Skin: Skin is warm and dry. No rash noted or new ulcerations Psychiatric:  Has normal mood and affect. Behavior is normal without agitation No other exam findings Lab Results  Component Value Date   WBC 3.8 (L) 05/01/2018   HGB 13.5 05/01/2018   HCT 40.0 05/01/2018   PLT 88.0 (L) 05/01/2018   GLUCOSE 77 05/01/2018   CHOL 131 05/01/2018   TRIG 117.0 05/01/2018   HDL 23.30 (L) 05/01/2018   LDLDIRECT 62.0 04/26/2016   LDLCALC 84 05/01/2018   ALT 20 05/01/2018   AST 18 05/01/2018   NA 144 05/01/2018   K 3.9 05/01/2018   CL 108 05/01/2018   CREATININE 1.22 05/01/2018   BUN 14 05/01/2018   CO2 24 05/01/2018   TSH 1.23 05/01/2018   PSA 3.24 05/01/2018   INR 1.0 03/03/2008   HGBA1C 5.3 05/01/2018       Assessment & Plan:

## 2018-05-09 NOTE — Assessment & Plan Note (Signed)

## 2018-05-09 NOTE — Assessment & Plan Note (Signed)
?   Early ITP, for f/u cbc with plt today and repeat in 1 wk, also refer Heme for after Mar 16 per pt request as will be out of town unless we ask him to not go

## 2018-05-10 ENCOUNTER — Telehealth: Payer: Self-pay | Admitting: Internal Medicine

## 2018-05-10 NOTE — Telephone Encounter (Signed)
I think it depends on where he is going and doing.  If he going to a populated area, that might be ok as long as medical care is readily available on site.  Going on a cruise or on a [place] trip to [place] may not be good ideas, as the level of care would not be high if he had symptoms of bleeding or bruising

## 2018-05-10 NOTE — Telephone Encounter (Signed)
Shirron  Ok to let pt know, that the platelet count for him was 76K, which is similar but slightly lower.  We will recheck the test as recommended at his visit in 1 wk, but also refer to hematology as well

## 2018-05-10 NOTE — Telephone Encounter (Signed)
Pt has been has informed and expressed understanding. He states that he is suppose to go on a trip for 2 weeks and will be leaving out Wednesday. He would like to know should he cancel and hold off on the trip? Please advise.

## 2018-05-10 NOTE — Telephone Encounter (Signed)
Pt has been informed and expressed understand.

## 2018-05-12 ENCOUNTER — Other Ambulatory Visit: Payer: Self-pay | Admitting: Internal Medicine

## 2018-05-14 ENCOUNTER — Telehealth: Payer: Self-pay

## 2018-05-14 ENCOUNTER — Encounter: Payer: Self-pay | Admitting: Internal Medicine

## 2018-05-14 ENCOUNTER — Other Ambulatory Visit (INDEPENDENT_AMBULATORY_CARE_PROVIDER_SITE_OTHER): Payer: Medicare Other

## 2018-05-14 DIAGNOSIS — D696 Thrombocytopenia, unspecified: Secondary | ICD-10-CM | POA: Diagnosis not present

## 2018-05-14 LAB — CBC WITH DIFFERENTIAL/PLATELET
BASOS PCT: 0.3 % (ref 0.0–3.0)
Basophils Absolute: 0 10*3/uL (ref 0.0–0.1)
Eosinophils Absolute: 0.1 10*3/uL (ref 0.0–0.7)
Eosinophils Relative: 1.3 % (ref 0.0–5.0)
HEMATOCRIT: 39.2 % (ref 39.0–52.0)
Hemoglobin: 13.2 g/dL (ref 13.0–17.0)
Lymphocytes Relative: 38.1 % (ref 12.0–46.0)
Lymphs Abs: 1.6 10*3/uL (ref 0.7–4.0)
MCHC: 33.6 g/dL (ref 30.0–36.0)
MCV: 90.2 fl (ref 78.0–100.0)
Monocytes Absolute: 0.4 10*3/uL (ref 0.1–1.0)
Monocytes Relative: 9.3 % (ref 3.0–12.0)
Neutro Abs: 2.1 10*3/uL (ref 1.4–7.7)
Neutrophils Relative %: 51 % (ref 43.0–77.0)
Platelets: 86 10*3/uL — ABNORMAL LOW (ref 150.0–400.0)
RBC: 4.34 Mil/uL (ref 4.22–5.81)
RDW: 14.9 % (ref 11.5–15.5)
WBC: 4.2 10*3/uL (ref 4.0–10.5)

## 2018-05-14 NOTE — Telephone Encounter (Signed)
-----   Message from Biagio Borg, MD sent at 05/14/2018 12:28 PM EST ----- Left message on MyChart, pt to cont same tx except  The test results show that your current treatment is OK, as the platelets are still low but not getting worse.  Please plan to return to the lab in 2 weeks, if you are not already seen per Hematology.    [name] to please inform pt, he already has standing lab order, and referral in place

## 2018-05-14 NOTE — Telephone Encounter (Signed)
Pt has viewed results via MyChart  

## 2018-05-27 ENCOUNTER — Telehealth: Payer: Self-pay | Admitting: Internal Medicine

## 2018-05-27 NOTE — Telephone Encounter (Signed)
A new hem appt has been scheduled for the pt to see Dr. Walden Field on 3/18 at 950am. Pt aware to arrive 15 minutes early.

## 2018-05-29 ENCOUNTER — Telehealth: Payer: Self-pay | Admitting: Internal Medicine

## 2018-05-29 ENCOUNTER — Inpatient Hospital Stay: Payer: Medicare Other | Attending: Internal Medicine | Admitting: Internal Medicine

## 2018-05-29 ENCOUNTER — Inpatient Hospital Stay: Payer: Medicare Other

## 2018-05-29 ENCOUNTER — Other Ambulatory Visit: Payer: Self-pay

## 2018-05-29 VITALS — BP 161/94 | HR 69 | Temp 97.5°F | Resp 17 | Ht 69.0 in | Wt 276.3 lb

## 2018-05-29 DIAGNOSIS — M109 Gout, unspecified: Secondary | ICD-10-CM

## 2018-05-29 DIAGNOSIS — Z79899 Other long term (current) drug therapy: Secondary | ICD-10-CM | POA: Insufficient documentation

## 2018-05-29 DIAGNOSIS — D696 Thrombocytopenia, unspecified: Secondary | ICD-10-CM | POA: Diagnosis not present

## 2018-05-29 DIAGNOSIS — Z8719 Personal history of other diseases of the digestive system: Secondary | ICD-10-CM

## 2018-05-29 DIAGNOSIS — G4733 Obstructive sleep apnea (adult) (pediatric): Secondary | ICD-10-CM | POA: Diagnosis not present

## 2018-05-29 DIAGNOSIS — Z87891 Personal history of nicotine dependence: Secondary | ICD-10-CM | POA: Diagnosis not present

## 2018-05-29 DIAGNOSIS — Z862 Personal history of diseases of the blood and blood-forming organs and certain disorders involving the immune mechanism: Secondary | ICD-10-CM

## 2018-05-29 DIAGNOSIS — M170 Bilateral primary osteoarthritis of knee: Secondary | ICD-10-CM | POA: Diagnosis not present

## 2018-05-29 DIAGNOSIS — E785 Hyperlipidemia, unspecified: Secondary | ICD-10-CM | POA: Insufficient documentation

## 2018-05-29 DIAGNOSIS — R634 Abnormal weight loss: Secondary | ICD-10-CM

## 2018-05-29 DIAGNOSIS — Z95828 Presence of other vascular implants and grafts: Secondary | ICD-10-CM | POA: Diagnosis not present

## 2018-05-29 DIAGNOSIS — D538 Other specified nutritional anemias: Secondary | ICD-10-CM

## 2018-05-29 DIAGNOSIS — Z114 Encounter for screening for human immunodeficiency virus [HIV]: Secondary | ICD-10-CM

## 2018-05-29 DIAGNOSIS — D573 Sickle-cell trait: Secondary | ICD-10-CM | POA: Diagnosis not present

## 2018-05-29 DIAGNOSIS — C760 Malignant neoplasm of head, face and neck: Secondary | ICD-10-CM | POA: Diagnosis not present

## 2018-05-29 DIAGNOSIS — M25551 Pain in right hip: Secondary | ICD-10-CM

## 2018-05-29 DIAGNOSIS — I10 Essential (primary) hypertension: Secondary | ICD-10-CM | POA: Diagnosis not present

## 2018-05-29 DIAGNOSIS — Z791 Long term (current) use of non-steroidal anti-inflammatories (NSAID): Secondary | ICD-10-CM

## 2018-05-29 DIAGNOSIS — Z7982 Long term (current) use of aspirin: Secondary | ICD-10-CM | POA: Diagnosis not present

## 2018-05-29 DIAGNOSIS — D509 Iron deficiency anemia, unspecified: Secondary | ICD-10-CM | POA: Insufficient documentation

## 2018-05-29 LAB — COMPREHENSIVE METABOLIC PANEL
ALT: 22 U/L (ref 0–44)
ANION GAP: 10 (ref 5–15)
AST: 21 U/L (ref 15–41)
Albumin: 4.2 g/dL (ref 3.5–5.0)
Alkaline Phosphatase: 80 U/L (ref 38–126)
BUN: 11 mg/dL (ref 8–23)
CHLORIDE: 109 mmol/L (ref 98–111)
CO2: 24 mmol/L (ref 22–32)
Calcium: 8.4 mg/dL — ABNORMAL LOW (ref 8.9–10.3)
Creatinine, Ser: 1.16 mg/dL (ref 0.61–1.24)
GFR calc Af Amer: >60 mL/min (ref >60)
GFR calc non Af Amer: >60 mL/min (ref >60)
Glucose, Bld: 99 mg/dL (ref 70–99)
Potassium: 3.5 mmol/L (ref 3.5–5.1)
Sodium: 143 mmol/L (ref 135–145)
Total Bilirubin: 0.6 mg/dL (ref 0.3–1.2)
Total Protein: 7.5 g/dL (ref 6.5–8.1)

## 2018-05-29 LAB — CBC WITH DIFFERENTIAL/PLATELET
Abs Immature Granulocytes: 0.03 10*3/uL (ref 0.00–0.07)
Basophils Absolute: 0 10*3/uL (ref 0.0–0.1)
Basophils Relative: 1 %
EOS ABS: 0.1 10*3/uL (ref 0.0–0.5)
EOS PCT: 2 %
HCT: 38.6 % — ABNORMAL LOW (ref 39.0–52.0)
Hemoglobin: 13.1 g/dL (ref 13.0–17.0)
Immature Granulocytes: 1 %
Lymphocytes Relative: 40 %
Lymphs Abs: 1.4 10*3/uL (ref 0.7–4.0)
MCH: 30.1 pg (ref 26.0–34.0)
MCHC: 33.9 g/dL (ref 30.0–36.0)
MCV: 88.7 fL (ref 80.0–100.0)
Monocytes Absolute: 0.2 10*3/uL (ref 0.1–1.0)
Monocytes Relative: 6 %
NEUTROS PCT: 50 %
Neutro Abs: 1.8 10*3/uL (ref 1.7–7.7)
Platelets: 135 10*3/uL — ABNORMAL LOW (ref 150–400)
RBC: 4.35 MIL/uL (ref 4.22–5.81)
RDW: 14 % (ref 11.5–15.5)
WBC: 3.4 10*3/uL — ABNORMAL LOW (ref 4.0–10.5)
nRBC: 0 % (ref 0.0–0.2)

## 2018-05-29 LAB — PROTIME-INR
INR: 1 (ref 0.8–1.2)
Prothrombin Time: 13.5 seconds (ref 11.4–15.2)

## 2018-05-29 LAB — FOLATE: Folate: 10.8 ng/mL (ref >5.9)

## 2018-05-29 LAB — LACTATE DEHYDROGENASE: LDH: 195 U/L — ABNORMAL HIGH (ref 98–192)

## 2018-05-29 LAB — VITAMIN B12: Vitamin B-12: 413 pg/mL (ref 180–914)

## 2018-05-29 LAB — APTT: aPTT: 30 seconds (ref 24–36)

## 2018-05-29 LAB — URIC ACID: Uric Acid, Serum: 4.7 mg/dL (ref 3.7–8.6)

## 2018-05-29 LAB — SEDIMENTATION RATE: Sed Rate: 12 mm/hr (ref 0–16)

## 2018-05-29 LAB — FERRITIN: Ferritin: 18 ng/mL — ABNORMAL LOW (ref 24–336)

## 2018-05-29 LAB — C-REACTIVE PROTEIN: CRP: <0.8 mg/dL (ref <1.0)

## 2018-05-29 NOTE — Telephone Encounter (Signed)
Gave avs, calendar, and contrast ° °

## 2018-05-29 NOTE — Progress Notes (Signed)
Patient went to [place] on 05/17/2018.  No symptoms reported since.  MD Higgs aware.  Pt given mask.  Alone in room 24 for MD visit.

## 2018-05-29 NOTE — Progress Notes (Signed)
Referring Physician:  Biagio Borg, MD  [facility]   Diagnosis Thrombocytopenia ([place]) - Plan: CBC with Differential/Platelet, Comprehensive metabolic panel, Ferritin, Lactate dehydrogenase, Protein electrophoresis, serum, Hemoglobinopathy evaluation, Vitamin B12, Folate, Methylmalonic acid, serum, Haptoglobin, Protime-INR, APTT, Platelet Count on Citrated Bld, Hepatitis B surface antibody, Hepatitis B surface antigen, Hepatitis B core antibody, total, Hepatitis C antibody, HIV antibody (with reflex), ANA, IFA (with reflex), Rheumatoid factor, Sedimentation rate, C-reactive protein, Uric acid  Abnormal weight loss - Plan: CBC with Differential/Platelet, Comprehensive metabolic panel, Ferritin, Lactate dehydrogenase, Protein electrophoresis, serum, Hemoglobinopathy evaluation, Vitamin B12, Folate, Methylmalonic acid, serum, Haptoglobin, Protime-INR, APTT, Platelet Count on Citrated Bld, Hepatitis B surface antibody, Hepatitis B surface antigen, Hepatitis B core antibody, total, Hepatitis C antibody, HIV antibody (with reflex), CT ABDOMEN PELVIS W CONTRAST, ANA, IFA (with reflex), Rheumatoid factor, Sedimentation rate, C-reactive protein, Uric acid  Other specified nutritional anemias - Plan: CBC with Differential/Platelet, Comprehensive metabolic panel, Ferritin, Lactate dehydrogenase, Protein electrophoresis, serum, Hemoglobinopathy evaluation, Vitamin B12, Folate, Methylmalonic acid, serum, Haptoglobin, Protime-INR, APTT, Platelet Count on Citrated Bld, Hepatitis B surface antibody, Hepatitis B surface antigen, Hepatitis B core antibody, total, Hepatitis C antibody, HIV antibody (with reflex), ANA, IFA (with reflex), Rheumatoid factor, Sedimentation rate, C-reactive protein, Uric acid  Pain in joint involving right pelvic region and thigh - Plan: CBC with Differential/Platelet, Comprehensive metabolic panel, Ferritin, Lactate dehydrogenase, Protein electrophoresis, serum, Hemoglobinopathy  evaluation, Vitamin B12, Folate, Methylmalonic acid, serum, Haptoglobin, Protime-INR, APTT, Platelet Count on Citrated Bld, Hepatitis B surface antibody, Hepatitis B surface antigen, Hepatitis B core antibody, total, Hepatitis C antibody, HIV antibody (with reflex), ANA, IFA (with reflex), Rheumatoid factor, Sedimentation rate, C-reactive protein, Uric acid  Screening for HIV (human immunodeficiency virus) - Plan: CBC with Differential/Platelet, Comprehensive metabolic panel, Ferritin, Lactate dehydrogenase, Protein electrophoresis, serum, Hemoglobinopathy evaluation, Vitamin B12, Folate, Methylmalonic acid, serum, Haptoglobin, Protime-INR, APTT, Platelet Count on Citrated Bld, Hepatitis B surface antibody, Hepatitis B surface antigen, Hepatitis B core antibody, total, Hepatitis C antibody, HIV antibody (with reflex), ANA, IFA (with reflex), Rheumatoid factor, Sedimentation rate, C-reactive protein, Uric acid  Staging Cancer Staging No matching staging information was found for the patient.  Assessment and Plan:  1.  Thrombocytopenia.   68 year old male referred for evaluation due to thrombocytopenia.  Pt had labs done 04/30/2017 that showed WBC 3.9 HB 13.1 plts 131,000.  He had normal differential.  Chemistries WNL with K+ 3.6 Cr 1.31 normal LFTs.  Labs done 05/01/2018 showed WBC 3.8 HB 13.5 plts 88,000.  Normal differential.  Labs done 05/09/2018 showed WBC 4.4 HB 13.2 plts 76,000.  Normal differential.  Chemistries WNL with K+ 3.6 Cr 1.18 normal LFTs.  Labs done 05/14/2018 showed WBC 4.2 HB 13.2 plts 86,000. Normal differential.    Pt had been taking Indocin for quite some time due to joint pain.  He is now on Relafen.  He reports joint pain and reported diagnosis of gout.  He recently traveled to [place] for a bike week event.  He denies any bruising or bleeding.  He has a remote history of smoking and reports he last smoked in 1978.  He reports prior history of heavy alcohol use.  Pt denies any family  history of leukemias or lymphomas.  He denies fevers, chills, night sweats, adenopathy, weight loss.  Last colonoscopy was 04/13/2016 and showed 2 polyps, diverticulosis and hemorrhoids.  Pathology returned as hyperplastic polyps.  Pt is seen today for consultation due to thrombocytopenia.    Labs done  today 05/29/2018 reviewed and showed WBC 3.4 HB 13.1 plts 135,000.  Pt has normal differential.  Chemistries WNL with K+ 3.5 Cr 1.16 and normal LFTs.  PT 13.5 PTT 30. Uric acid 4.7.  Platelet count improved at 135,000.  Suspect normal variant or due to long standing NSAID use with indocin.  Awaiting results of Hepatitis and HIV testing.  He will RTC to go over results.    2.  Joint pain.  Pt has normal Uric acid level.  He has normal CRP.  Awaiting results of SPEP, RF, ANA.  Pt will RTC to go over results.   3.  Abdominal pain and history of alcohol use.  Pt will be set up for CT abdomen and pelvis due to history of thrombocytopenia to evaluate liver and spleen.    4.  HTN.  BP is 161/94.  Follow-up with PCP for management.    5.  Microcytic anemia.  HB is 13.  Ferritin decreased at 18.  Pt reports history of sickle cell trait.  Awaiting results of HB electrophoresis.  Pt will be given option of iron therapy on RTC.    6.  Obstructive sleep apnea.  Follow-up with pulmonary or PCP for management.    7.  Health maintenance.  Last colonosocopy was in 04/2016 and showed hyperplastic polyps and hemorrhoids.  Follow-up with GI as recommended.    40 minutes spent with more than 50% spent in review of records, counseling and coordination of care.    HPI:  68 year old male referred for evaluation due to thrombocytopenia.  Pt had labs done 04/30/2017 that showed WBC 3.9 HB 13.1 plts 131,000.  He had normal differential.  Chemistries WNL with K+ 3.6 Cr 1.31 normal LFTs.  Labs done 05/01/2018 showed WBC 3.8 HB 13.5 plts 88,000.  Normal differential.  Labs done 05/09/2018 showed WBC 4.4 HB 13.2 plts 76,000.  Normal  differential.  Chemistries WNL with K+ 3.6 Cr 1.18 normal LFTs.  Labs done 05/14/2018 showed WBC 4.2 HB 13.2 plts 86,000. Normal differential.    Pt had been taking Indocin for quite some time due to joint pain.  He is now on Relafen.  He reports joint pain and reported diagnosis of gout.  He recently traveled to [place] for a bike week event.  He denies any bruising or bleeding.  He has a remote history of smoking and reports he last smoked in 1978.  He reports prior history of heavy alcohol use.  Pt denies any family history of leukemias or lymphomas.  He denies fevers, chills, night sweats, adenopathy, weight loss.  Last colonoscopy was 04/13/2016 and showed 2 polyps, diverticulosis and hemorrhoids.  Pathology returned as hyperplastic polyps.  Pt is seen today for consultation due to thrombocytopenia.    Problem List Patient Active Problem List   Diagnosis Date Noted  . Thrombocytopenia ([place]) [D69.6] 05/09/2018  . Degenerative arthritis of knee, bilateral [M17.0] 12/17/2017  . Degenerative TFCC tear, right [M24.131] 11/08/2017  . Carpal tunnel syndrome on right [G56.01] 08/31/2017  . Degenerative joint disease of knee, left [M17.12] 08/03/2017  . Right wrist pain [M25.531] 07/17/2017  . Rash [R21] 05/08/2017  . Sinus bradycardia [R00.1] 05/04/2016  . Hyperglycemia [R73.9] 05/04/2016  . Low serum HDL [R74.8] 05/04/2016  . Hypertriglyceridemia [E78.1] 05/04/2016  . Left rotator cuff tear [M75.102] 11/23/2015  . Memory dysfunction [R41.3] 04/23/2015  . Bursitis of right shoulder [M75.51] 03/03/2015  . Pain of right side of body [R52] 01/16/2015  . Acute meniscal tear  of left knee [S83.207A] 12/10/2013  . Localized osteoarthrosis, lower leg [M17.10] 12/10/2013  . OSA (obstructive sleep apnea) [G47.33] 11/13/2013  . Peripheral edema [R60.9] 08/27/2013  . Diarrhea [R19.7] 08/27/2013  . Increased prostate specific antigen (PSA) velocity [R97.20] 04/02/2013  . Bilateral knee pain [M25.561,  M25.562] 11/13/2012  . Left knee pain [M25.562] 11/13/2012  . Chronic gout of knee due to drug [M1A.2690] 11/13/2012  . Urinary frequency [R35.0] 07/20/2012  . Urinary retention [R33.9] 07/20/2012  . Hip pain, acute [M25.559] 07/07/2012  . Preventative health care [Z00.00] 02/28/2011  . SKIN LESION [L98.9] 05/26/2009  . LIBIDO, DECREASED [R68.82] 05/26/2009  . BACK PAIN [M54.9] 04/01/2009  . GOUT [M10.9] 10/30/2007  . Hyperlipidemia [E78.5] 09/04/2007  . Nocturia [R35.1] 09/04/2007  . FATIGUE [R53.81, R53.83] 08/20/2007  . ALLERGIC RHINITIS [J30.9] 05/01/2007  . PERIPHERAL VASCULAR DISEASE [I73.9] 03/09/2007  . DIVERTICULOSIS, COLON [K57.30] 03/09/2007  . COLONIC POLYPS, HX OF [Z86.010] 03/09/2007  . Essential hypertension [I10] 10/31/2006    Past Medical History Past Medical History:  Diagnosis Date  . Allergy   . Arthritis   . Diverticulosis   . Gout   . Hyperlipidemia   . Hypertension   . OSA (obstructive sleep apnea) 11/13/2013  . Sickle cell trait ([place])   . Sleep apnea     Past Surgical History Past Surgical History:  Procedure Laterality Date  . COLONOSCOPY    . POLYPECTOMY    . ROTATOR CUFF REPAIR     left  . THROAT SURGERY     polyps removed from vocal cord    Family History Family History  Problem Relation Age of Onset  . Colon cancer Neg Hx      Social History  reports that he quit smoking about 41 years ago. His smoking use included cigarettes. He has a 10.00 pack-year smoking history. He has never used smokeless tobacco. He reports that he does not drink alcohol or use drugs.  Medications  Current Outpatient Medications:  .  allopurinol (ZYLOPRIM) 300 MG tablet, Take 1 tablet (300 mg total) by mouth daily., Disp: 90 tablet, Rfl: 3 .  aspirin 81 MG tablet, Take 81 mg by mouth daily.  , Disp: , Rfl:  .  colchicine 0.6 MG tablet, TAKE 1 TABLET BY MOUTH ONCE DAILY, Disp: 90 tablet, Rfl: 0 .  finasteride (PROSCAR) 5 MG tablet, Take 1 tablet (5 mg  total) by mouth daily., Disp: 90 tablet, Rfl: 3 .  fluticasone (FLONASE) 50 MCG/ACT nasal spray, Place 2 sprays into the nose daily., Disp: 48 g, Rfl: 3 .  glucosamine-chondroitin 500-400 MG tablet, Take 1 tablet by mouth daily.  , Disp: , Rfl:  .  indomethacin (INDOCIN) 50 MG capsule, TAKE 1 CAPSULE BY MOUTH THREE TIMES DAILY AS NEEDED, Disp: 270 capsule, Rfl: 0 .  irbesartan (AVAPRO) 300 MG tablet, Take 1 tablet (300 mg total) by mouth daily., Disp: 90 tablet, Rfl: 2 .  loratadine (CLARITIN) 10 MG tablet, Take 10 mg by mouth daily as needed for allergies., Disp: , Rfl:  .  nabumetone (RELAFEN) 500 MG tablet, TAKE 1 TABLET(500 MG) BY MOUTH DAILY, Disp: 90 tablet, Rfl: 1 .  pravastatin (PRAVACHOL) 40 MG tablet, Take 1 tablet (40 mg total) by mouth daily., Disp: 90 tablet, Rfl: 2 .  sildenafil (VIAGRA) 100 MG tablet, Take 1 tablet (100 mg total) by mouth as needed for erectile dysfunction. Yearly physical w/labs due in February must see MD for refills, Disp: 10 tablet, Rfl: 0 .  tamsulosin (  FLOMAX) 0.4 MG CAPS capsule, TAKE 1 CAPSULE BY MOUTH  DAILY, Disp: 90 capsule, Rfl: 1 .  verapamil (CALAN-SR) 240 MG CR tablet, Take 1 tablet (240 mg total) by mouth at bedtime., Disp: 90 tablet, Rfl: 2  Allergies Patient has no known allergies.  Review of Systems Review of Systems - Oncology ROS negative other than joint pain and abdominal pain   Physical Exam  Vitals Wt Readings from Last 3 Encounters:  05/29/18 276 lb 4.8 oz (125.3 kg)  05/09/18 269 lb (122 kg)  01/16/18 269 lb (122 kg)   Temp Readings from Last 3 Encounters:  05/29/18 (!) 97.5 F (36.4 C) (Oral)  05/09/18 97.9 F (36.6 C) (Oral)  05/08/17 97.6 F (36.4 C) (Oral)   BP Readings from Last 3 Encounters:  05/29/18 (!) 161/94  05/09/18 (!) 152/92  01/16/18 128/82   Pulse Readings from Last 3 Encounters:  05/29/18 69  05/09/18 69  01/16/18 82   Constitutional: Well-developed, well-nourished, and in no distress.   HENT:  Head: Normocephalic and atraumatic.  Mouth/Throat: No oropharyngeal exudate. Mucosa moist. Eyes: Pupils are equal, round, and reactive to light. Conjunctivae are normal. No scleral icterus.  Neck: Normal range of motion. Neck supple. No JVD present.  Cardiovascular: Normal rate, regular rhythm and normal heart sounds.  Exam reveals no gallop and no friction rub.   No murmur heard. Pulmonary/Chest: Effort normal and breath sounds normal. No respiratory distress. No wheezes.No rales.  Abdominal: Soft. Bowel sounds are normal. No distension. There is no tenderness. There is no guarding.  Musculoskeletal: No edema or tenderness.  Lymphadenopathy: No cervical,axillary or supraclavicular adenopathy.  Neurological: Alert and oriented to person, place, and time. No cranial nerve deficit.  Skin: Skin is warm and dry. No rash noted. No erythema. No pallor.  Psychiatric: Affect and judgment normal.   Labs Appointment on 05/29/2018  Component Date Value Ref Range Status  . Uric Acid, Serum 05/29/2018 4.7  3.7 - 8.6 mg/dL Final   Performed at [facility], [place] [address], [place], [place] [address]  . CRP 05/29/2018 <0.8  <1.0 mg/dL Final   Performed at [facility] [address], [place], [place] [address]  . Sed Rate 05/29/2018 12  0 - 16 mm/hr Final   Performed at [facility], [place] [address] [place], [place] [address]  . aPTT 05/29/2018 30  24 - 36 seconds Final   Performed at [facility], [place] [address] [place], [place] [address]  . Prothrombin Time 05/29/2018 13.5  11.4 - 15.2 seconds Final  . INR 05/29/2018 1.0  0.8 - 1.2 Final   Comment: (NOTE) INR goal varies based on device and disease states. Performed at [facility], [place] [address] [place], [place] [address]   . Folate 05/29/2018 10.8  >5.9 ng/mL Final   Performed at [facility] [address]  [place], [place] [address]  . Vitamin B-12 05/29/2018 413  180 - 914 pg/mL Final   Comment: (NOTE) This assay is not validated for testing neonatal or myeloproliferative syndrome specimens for Vitamin B12 levels. Performed at [facility], [place] [address] [place], [place] [address]   . LDH 05/29/2018 195* 98 - 192 U/L Final   Performed at [facility], [place] [address], [place], [place] [address]  . Ferritin 05/29/2018 18* 24 - 336 ng/mL Final   Performed at [facility], [place]  [address] [place], [place] [address]  . Sodium 05/29/2018 143  135 - 145 mmol/L Final  . Potassium 05/29/2018 3.5  3.5 - 5.1 mmol/L Final  . Chloride 05/29/2018 109  98 - 111 mmol/L Final  . CO2 05/29/2018 24  22 - 32 mmol/L Final  . Glucose, Bld 05/29/2018 99  70 - 99 mg/dL Final  . BUN 05/29/2018 11  8 - 23 mg/dL Final  . Creatinine, Ser 05/29/2018 1.16  0.61 - 1.24 mg/dL Final  . Calcium 05/29/2018 8.4* 8.9 - 10.3 mg/dL Final  . Total Protein 05/29/2018 7.5  6.5 - 8.1 g/dL Final  . Albumin 05/29/2018 4.2  3.5 - 5.0 g/dL Final  . AST 05/29/2018 21  15 - 41 U/L Final  . ALT 05/29/2018 22  0 - 44 U/L Final  . Alkaline Phosphatase 05/29/2018 80  38 - 126 U/L Final  . Total Bilirubin 05/29/2018 0.6  0.3 - 1.2 mg/dL Final  . GFR calc non Af Amer 05/29/2018 >60  >60 mL/min Final  . GFR calc Af Amer 05/29/2018 >60  >60 mL/min Final  . Anion gap 05/29/2018 10  5 - 15 Final   Performed at [facility], [place] [address], [place], [place] [address]  . WBC 05/29/2018 3.4* 4.0 - 10.5 K/uL Final  . RBC 05/29/2018 4.35  4.22 - 5.81 MIL/uL Final  . Hemoglobin 05/29/2018 13.1  13.0 - 17.0 g/dL Final  . HCT 05/29/2018 38.6* 39.0 - 52.0 % Final  . MCV 05/29/2018 88.7  80.0 - 100.0 fL Final  . MCH 05/29/2018 30.1  26.0 - 34.0 pg Final  . MCHC 05/29/2018 33.9  30.0 - 36.0 g/dL Final  . RDW 05/29/2018 14.0  11.5 - 15.5 % Final  . Platelets  05/29/2018 135* 150 - 400 K/uL Final  . nRBC 05/29/2018 0.0  0.0 - 0.2 % Final  . Neutrophils Relative % 05/29/2018 50  % Final  . Neutro Abs 05/29/2018 1.8  1.7 - 7.7 K/uL Final  . Lymphocytes Relative 05/29/2018 40  % Final  . Lymphs Abs 05/29/2018 1.4  0.7 - 4.0 K/uL Final  . Monocytes Relative 05/29/2018 6  % Final  . Monocytes Absolute 05/29/2018 0.2  0.1 - 1.0 K/uL Final  . Eosinophils Relative 05/29/2018 2  % Final  . Eosinophils Absolute 05/29/2018 0.1  0.0 - 0.5 K/uL Final  . Basophils Relative 05/29/2018 1  % Final  . Basophils Absolute 05/29/2018 0.0  0.0 - 0.1 K/uL Final  . Smear Review 05/29/2018 LARGE PLALETS SEEN   Final  . Immature Granulocytes 05/29/2018 1  % Final  . Abs Immature Granulocytes 05/29/2018 0.03  0.00 - 0.07 K/uL Final   Performed at [facility], [place] [address], [place], [place] [address]     Pathology Orders Placed This Encounter  Procedures  . CT ABDOMEN PELVIS W CONTRAST    Standing Status:   Future    Standing Expiration Date:   05/29/2019    Order Specific Question:   If indicated for the ordered procedure, I authorize the administration of contrast media per Radiology protocol    Answer:   Yes    Order Specific Question:   Preferred imaging location?    Answer:   [facility]    Order Specific Question:   Is Oral Contrast requested for this exam?    Answer:   Yes, Per Radiology protocol    Order Specific Question:   Radiology Contrast Protocol -  do NOT remove file path    Answer:   [email or web address]  . CBC with Differential/Platelet    Standing Status:   Future    Number of Occurrences:   1    Standing Expiration Date:   05/29/2019  . Comprehensive metabolic panel    Standing Status:   Future    Number of Occurrences:   1    Standing Expiration Date:   05/29/2019  . Ferritin    Standing Status:   Future    Number of Occurrences:   1    Standing Expiration Date:   05/29/2019  .  Lactate dehydrogenase    Standing Status:   Future    Number of Occurrences:   1    Standing Expiration Date:   05/29/2019  . Protein electrophoresis, serum    Standing Status:   Future    Number of Occurrences:   1    Standing Expiration Date:   05/29/2019  . Hemoglobinopathy evaluation    Standing Status:   Future    Number of Occurrences:   1    Standing Expiration Date:   05/29/2019  . Vitamin B12    Standing Status:   Future    Number of Occurrences:   1    Standing Expiration Date:   05/29/2019  . Folate    Standing Status:   Future    Number of Occurrences:   1    Standing Expiration Date:   05/29/2019  . Methylmalonic acid, serum    Standing Status:   Future    Number of Occurrences:   1    Standing Expiration Date:   05/29/2019  . Haptoglobin    Standing Status:   Future    Number of Occurrences:   1    Standing Expiration Date:   05/29/2019  . Protime-INR    Standing Status:   Future    Number of Occurrences:   1    Standing Expiration Date:   05/29/2019  . APTT    Standing Status:   Future    Number of Occurrences:   1    Standing Expiration Date:   05/29/2019  . Platelet Count on Citrated Bld  . Hepatitis B surface antibody    Standing Status:   Future    Number of Occurrences:   1    Standing Expiration Date:   05/29/2019  . Hepatitis B surface antigen    Standing Status:   Future    Number of Occurrences:   1    Standing Expiration Date:   05/29/2019  . Hepatitis B core antibody, total    Standing Status:   Future    Number of Occurrences:   1    Standing Expiration Date:   05/29/2019  . Hepatitis C antibody    Standing Status:   Future    Number of Occurrences:   1    Standing Expiration Date:   05/29/2019  . HIV antibody (with reflex)    Standing Status:   Future    Number of Occurrences:   1    Standing Expiration Date:   05/29/2019  . ANA, IFA (with reflex)    Standing Status:   Future    Number of Occurrences:   1    Standing Expiration Date:    05/29/2019  . Rheumatoid factor    Standing Status:   Future    Number of Occurrences:   1    Standing Expiration Date:  05/29/2019  . Sedimentation rate    Standing Status:   Future    Number of Occurrences:   1    Standing Expiration Date:   05/29/2019  . C-reactive protein    Standing Status:   Future    Number of Occurrences:   1    Standing Expiration Date:   05/29/2019  . Uric acid    Standing Status:   Future    Number of Occurrences:   1    Standing Expiration Date:   05/29/2019       Zoila Shutter MD

## 2018-05-30 LAB — HEPATITIS C ANTIBODY: HCV Ab: <0.1 s/co ratio (ref 0.0–0.9)

## 2018-05-30 LAB — PROTEIN ELECTROPHORESIS, SERUM
A/G Ratio: 1.4 (ref 0.7–1.7)
ALPHA-1-GLOBULIN: 0.2 g/dL (ref 0.0–0.4)
ALPHA-2-GLOBULIN: 0.5 g/dL (ref 0.4–1.0)
Albumin ELP: 4.2 g/dL (ref 2.9–4.4)
Beta Globulin: 1 g/dL (ref 0.7–1.3)
Gamma Globulin: 1.2 g/dL (ref 0.4–1.8)
Globulin, Total: 2.9 g/dL (ref 2.2–3.9)
M-Spike, %: 0.2 g/dL — ABNORMAL HIGH
Total Protein ELP: 7.1 g/dL (ref 6.0–8.5)

## 2018-05-30 LAB — HEMOGLOBINOPATHY EVALUATION
Hgb A2 Quant: 4.2 % — ABNORMAL HIGH (ref 1.8–3.2)
Hgb A: 57.8 % — ABNORMAL LOW (ref 96.4–98.8)
Hgb C: 0 %
Hgb F Quant: 0 % (ref 0.0–2.0)
Hgb S Quant: 38 % — ABNORMAL HIGH
Hgb Variant: 0 %

## 2018-05-30 LAB — HAPTOGLOBIN: HAPTOGLOBIN: 50 mg/dL (ref 32–363)

## 2018-05-30 LAB — RHEUMATOID FACTOR: Rheumatoid fact SerPl-aCnc: 10.3 IU/mL (ref 0.0–13.9)

## 2018-05-30 LAB — HIV ANTIBODY (ROUTINE TESTING W REFLEX): HIV Screen 4th Generation wRfx: NONREACTIVE

## 2018-05-30 LAB — HEPATITIS B SURFACE ANTIBODY,QUALITATIVE: Hep B S Ab: NONREACTIVE

## 2018-05-30 LAB — HEPATITIS B SURFACE ANTIGEN: Hepatitis B Surface Ag: NEGATIVE

## 2018-05-30 LAB — ANTINUCLEAR ANTIBODIES, IFA: ANA Ab, IFA: NEGATIVE

## 2018-05-30 LAB — HEPATITIS B CORE ANTIBODY, TOTAL: Hep B Core Total Ab: POSITIVE — AB

## 2018-05-31 LAB — METHYLMALONIC ACID, SERUM: METHYLMALONIC ACID, QUANTITATIVE: 107 nmol/L (ref 0–378)

## 2018-06-04 ENCOUNTER — Ambulatory Visit: Payer: Medicare Other | Admitting: Pulmonary Disease

## 2018-06-05 ENCOUNTER — Other Ambulatory Visit: Payer: Self-pay

## 2018-06-05 ENCOUNTER — Ambulatory Visit (HOSPITAL_COMMUNITY)
Admission: RE | Admit: 2018-06-05 | Discharge: 2018-06-05 | Disposition: A | Discharge status: Home / Self Care | Payer: Medicare Other | Source: Ambulatory Visit | Attending: Internal Medicine | Admitting: Internal Medicine

## 2018-06-05 DIAGNOSIS — R634 Abnormal weight loss: Secondary | ICD-10-CM | POA: Insufficient documentation

## 2018-06-05 DIAGNOSIS — K5790 Diverticulosis of intestine, part unspecified, without perforation or abscess without bleeding: Secondary | ICD-10-CM | POA: Diagnosis not present

## 2018-06-05 DIAGNOSIS — N2889 Other specified disorders of kidney and ureter: Secondary | ICD-10-CM | POA: Diagnosis not present

## 2018-06-05 MED ORDER — SODIUM CHLORIDE (PF) 0.9 % IJ SOLN
INTRAMUSCULAR | Status: AC
  Filled 2018-06-05: qty 50

## 2018-06-05 MED ORDER — IOHEXOL 300 MG/ML  SOLN
100.0000 mL | Freq: Once | INTRAMUSCULAR | Status: AC | PRN
  Administered 2018-06-05: 100 mL via INTRAVENOUS

## 2018-06-12 ENCOUNTER — Inpatient Hospital Stay: Payer: Medicare Other | Attending: Internal Medicine | Admitting: Internal Medicine

## 2018-06-12 DIAGNOSIS — D472 Monoclonal gammopathy: Secondary | ICD-10-CM | POA: Diagnosis not present

## 2018-06-12 DIAGNOSIS — D509 Iron deficiency anemia, unspecified: Secondary | ICD-10-CM

## 2018-06-12 DIAGNOSIS — D508 Other iron deficiency anemias: Secondary | ICD-10-CM

## 2018-06-12 DIAGNOSIS — D696 Thrombocytopenia, unspecified: Secondary | ICD-10-CM | POA: Diagnosis not present

## 2018-06-12 NOTE — Progress Notes (Signed)
Virtual Visit via Telephone Note  I connected with Calvin [name]. on 06/12/18 at  9:30 AM EDT by telephone and verified that I am speaking with the correct person using two identifiers.   I discussed the limitations, risks, security and privacy concerns of performing an evaluation and management service by telephone and the availability of in person appointments. I also discussed with the patient that there may be a patient responsible charge related to this service. The patient expressed understanding and agreed to proceed.   Interval History:  Historical data obtained from note dated 05/29/2018:  68 year old male referred for evaluation due to thrombocytopenia.  Pt had labs done 04/30/2017 that showed WBC 3.9 HB 13.1 plts 131,000.  He had normal differential.  Chemistries WNL with K+ 3.6 Cr 1.31 normal LFTs.  Labs done 05/01/2018 showed WBC 3.8 HB 13.5 plts 88,000.  Normal differential.  Labs done 05/09/2018 showed WBC 4.4 HB 13.2 plts 76,000.  Normal differential.  Chemistries WNL with K+ 3.6 Cr 1.18 normal LFTs.  Labs done 05/14/2018 showed WBC 4.2 HB 13.2 plts 86,000. Normal differential.    Pt had been taking Indocin for quite some time due to joint pain.  He is now on Relafen.  He reports joint pain and reported diagnosis of gout.  He recently traveled to [place] for a bike week event.  He denies any bruising or bleeding.  He has a remote history of smoking and reports he last smoked in 1978.  He reports prior history of heavy alcohol use.  Pt denies any family history of leukemias or lymphomas.  He denies fevers, chills, night sweats, adenopathy, weight loss.  Last colonoscopy was 04/13/2016 and showed 2 polyps, diverticulosis and hemorrhoids.  Pathology returned as hyperplastic polyps.     Observations/Objective:Phone visit to go over lab and scan results.     Assessment and Plan:1.  Thrombocytopenia.   68 year old male referred for evaluation due to thrombocytopenia.  Pt had labs done  04/30/2017 that showed WBC 3.9 HB 13.1 plts 131,000.  He had normal differential.  Chemistries WNL with K+ 3.6 Cr 1.31 normal LFTs.  Labs done 05/01/2018 showed WBC 3.8 HB 13.5 plts 88,000.  Normal differential.  Labs done 05/09/2018 showed WBC 4.4 HB 13.2 plts 76,000.  Normal differential.  Chemistries WNL with K+ 3.6 Cr 1.18 normal LFTs.  Labs done 05/14/2018 showed WBC 4.2 HB 13.2 plts 86,000. Normal differential.    Pt had been taking Indocin for quite some time due to joint pain.  He is now on Relafen.  He reports joint pain and reported diagnosis of gout.  He recently traveled to [place] for a bike week event.  He denies any bruising or bleeding.  He has a remote history of smoking and reports he last smoked in 1978.  He reports prior history of heavy alcohol use.  Pt denies any family history of leukemias or lymphomas.  He denies fevers, chills, night sweats, adenopathy, weight loss.  Last colonoscopy was 04/13/2016 and showed 2 polyps, diverticulosis and hemorrhoids.  Pathology returned as hyperplastic polyps.   Labs done today 05/29/2018 reviewed and showed WBC 3.4 HB 13.1 plts 135,000.  Pt has normal differential.  Chemistries WNL with K+ 3.5 Cr 1.16 and normal LFTs.  PT 13.5 PTT 30. Uric acid 4.7.  Platelet count improved at 135,000.  Suspect normal variant or due to long standing NSAID use with indocin. Pt has negative Hepatitis panel other than reactive core antibody and negative  HIV testing.    CT done 06/05/2018 shows no liver or spleen abnormalities.    2.  Monoclonal gammopathy.  He has a faint M spike of 0.2 g/dl.  Will repeat SPEP and check Quant IG, FLC ratio in 09/2018.    3.  Joint pain.  Pt has normal Uric acid level.  He has normal CRP.  He has negative RF, ANA.  Follow-up with PCP as directed.    4.  Abdominal pain and history of alcohol use.  CT abdomen and pelvis was done 06/05/2018  due to history of thrombocytopenia to evaluate liver and spleen.    CT done 06/05/2018  Showed   IMPRESSION: 1. No acute findings are noted in the abdomen or pelvis. 2. Colonic diverticulosis without evidence of acute diverticulitis at this time. 3. Aortic atherosclerosis  Follow-up with PCP or GI if ongoing symptoms.    5.  HTN.  Follow-up with PCP for management.   6.  Microcytic anemia.  HB is 13.  Ferritin decreased at 18.  Pt given option or oral or IV iron.  He will start [place] vitamins as he said he tolerated these in the past.  He will have repeat labs done in 09/2018.  Follow-up with GI as recommended.  Will send message to GI regarding iron deficiency.    7.  Sickle cell trait.  Pt reports history of sickle cell trait.  This was confirmed on HB electrophoresis done 05/29/18. This should be an asymptomatic condition.    8.  Obstructive sleep apnea.  Follow-up with pulmonary or PCP for management.    9.  Health maintenance.  Last colonosocopy was in 04/2016 and showed hyperplastic polyps and hemorrhoids.  Follow-up with GI as recommended.    15 minutes spent with more than 50% spent in review of records, counseling and coordination of care.     Follow Up Instructions: Pt will follow-up in 09/2018 with repeat labs as ordered.      I discussed the assessment and treatment plan with the patient. The patient was provided an opportunity to ask questions and all were answered. The patient agreed with the plan and demonstrated an understanding of the instructions.   The patient was advised to call back or seek an in-person evaluation if the symptoms worsen or if the condition fails to improve as anticipated.  I provided 15 minutes of non-face-to-face time during this encounter.   Zoila Shutter, MD

## 2018-06-12 NOTE — Patient Instructions (Signed)
Follow-up July 2020 with labs

## 2018-06-18 ENCOUNTER — Ambulatory Visit: Payer: Medicare Other | Admitting: Pulmonary Disease

## 2018-07-02 ENCOUNTER — Encounter: Payer: Self-pay | Admitting: Internal Medicine

## 2018-07-02 NOTE — Telephone Encounter (Signed)
Please to call pt - for virtual visit tomorrow

## 2018-07-03 ENCOUNTER — Ambulatory Visit (INDEPENDENT_AMBULATORY_CARE_PROVIDER_SITE_OTHER): Payer: Medicare Other | Admitting: Internal Medicine

## 2018-07-03 ENCOUNTER — Encounter: Payer: Self-pay | Admitting: Internal Medicine

## 2018-07-03 ENCOUNTER — Telehealth: Payer: Self-pay

## 2018-07-03 ENCOUNTER — Other Ambulatory Visit (INDEPENDENT_AMBULATORY_CARE_PROVIDER_SITE_OTHER): Payer: Medicare Other

## 2018-07-03 ENCOUNTER — Other Ambulatory Visit: Payer: Self-pay

## 2018-07-03 ENCOUNTER — Other Ambulatory Visit: Payer: Self-pay | Admitting: Internal Medicine

## 2018-07-03 ENCOUNTER — Ambulatory Visit (INDEPENDENT_AMBULATORY_CARE_PROVIDER_SITE_OTHER)
Admission: RE | Admit: 2018-07-03 | Discharge: 2018-07-03 | Disposition: A | Discharge status: Home / Self Care | Payer: Medicare Other | Source: Ambulatory Visit | Attending: Internal Medicine | Admitting: Internal Medicine

## 2018-07-03 VITALS — Ht 69.0 in

## 2018-07-03 DIAGNOSIS — G4733 Obstructive sleep apnea (adult) (pediatric): Secondary | ICD-10-CM | POA: Diagnosis not present

## 2018-07-03 DIAGNOSIS — R509 Fever, unspecified: Secondary | ICD-10-CM | POA: Diagnosis not present

## 2018-07-03 DIAGNOSIS — R6883 Chills (without fever): Secondary | ICD-10-CM | POA: Diagnosis not present

## 2018-07-03 DIAGNOSIS — R739 Hyperglycemia, unspecified: Secondary | ICD-10-CM

## 2018-07-03 LAB — URINALYSIS, ROUTINE W REFLEX MICROSCOPIC
Bilirubin Urine: NEGATIVE
Ketones, ur: NEGATIVE
Leukocytes,Ua: NEGATIVE
Nitrite: NEGATIVE
RBC / HPF: NONE SEEN (ref 0–?)
Specific Gravity, Urine: 1.015 (ref 1.000–1.030)
Urine Glucose: NEGATIVE
Urobilinogen, UA: 0.2 (ref 0.0–1.0)
pH: 6 (ref 5.0–8.0)

## 2018-07-03 LAB — HEPATIC FUNCTION PANEL
ALT: 12 U/L (ref 0–53)
AST: 16 U/L (ref 0–37)
Albumin: 4.4 g/dL (ref 3.5–5.2)
Alkaline Phosphatase: 72 U/L (ref 39–117)
Bilirubin, Direct: 0.3 mg/dL (ref 0.0–0.3)
Total Bilirubin: 0.9 mg/dL (ref 0.2–1.2)
Total Protein: 7.7 g/dL (ref 6.0–8.3)

## 2018-07-03 LAB — CBC WITH DIFFERENTIAL/PLATELET
Basophils Absolute: 0 10*3/uL (ref 0.0–0.1)
Basophils Relative: 0.4 % (ref 0.0–3.0)
Eosinophils Absolute: 0 10*3/uL (ref 0.0–0.7)
Eosinophils Relative: 0.6 % (ref 0.0–5.0)
HCT: 39 % (ref 39.0–52.0)
Hemoglobin: 13.3 g/dL (ref 13.0–17.0)
Lymphocytes Relative: 13.4 % (ref 12.0–46.0)
Lymphs Abs: 0.7 10*3/uL (ref 0.7–4.0)
MCHC: 34.3 g/dL (ref 30.0–36.0)
MCV: 90.3 fl (ref 78.0–100.0)
Monocytes Absolute: 0.4 10*3/uL (ref 0.1–1.0)
Monocytes Relative: 7.9 % (ref 3.0–12.0)
Neutro Abs: 4.1 10*3/uL (ref 1.4–7.7)
Neutrophils Relative %: 77.7 % — ABNORMAL HIGH (ref 43.0–77.0)
Platelets: 90 10*3/uL — ABNORMAL LOW (ref 150.0–400.0)
RBC: 4.31 Mil/uL (ref 4.22–5.81)
RDW: 15.4 % (ref 11.5–15.5)
WBC: 5.3 10*3/uL (ref 4.0–10.5)

## 2018-07-03 LAB — BASIC METABOLIC PANEL
BUN: 16 mg/dL (ref 6–23)
CO2: 26 mEq/L (ref 19–32)
Calcium: 8.9 mg/dL (ref 8.4–10.5)
Chloride: 102 mEq/L (ref 96–112)
Creatinine, Ser: 1.45 mg/dL (ref 0.40–1.50)
GFR: 58.24 mL/min — ABNORMAL LOW (ref >60.00)
Glucose, Bld: 123 mg/dL — ABNORMAL HIGH (ref 70–99)
Potassium: 3 mEq/L — ABNORMAL LOW (ref 3.5–5.1)
Sodium: 139 mEq/L (ref 135–145)

## 2018-07-03 MED ORDER — POTASSIUM CHLORIDE CRYS ER 20 MEQ PO TBCR: EXTENDED_RELEASE_TABLET | ORAL | 0 refills | Status: DC

## 2018-07-03 NOTE — Progress Notes (Signed)
Patient ID: Calvin [name]., male   DOB: 08/31/1950, 68 y.o.   MRN: [id number]  Virtual Visit via Video Note  I connected with Calvin [name]. on 07/03/18 at 10:00 AM EDT by a video enabled telemedicine application and verified that I am speaking with the correct person using two identifiers. I am at office, pt is at home with wife in background present as well.   I discussed the limitations of evaluation and management by telemedicine and the availability of in person appointments. The patient expressed understanding and agreed to proceed.  History of Present Illness: Here with c/o 3 days acute onset slight HA with low grade temp and occasional hills up to 101, usually temp 99 on average.  No Sinus pain or congestion, ST, cough and Pt denies chest pain, increased sob or doe, wheezing, orthopnea, PND, increased LE swelling, palpitations, dizziness or syncope.  Pt denies new neurological symptoms such as new headache, or facial or extremity weakness or numbness   Pt denies polydipsia, polyuria.   Has good compliance with CPAP with good results. Past Medical History:  Diagnosis Date  . Allergy   . Arthritis   . Diverticulosis   . Gout   . Hyperlipidemia   . Hypertension   . OSA (obstructive sleep apnea) 11/13/2013  . Sickle cell trait ([place])   . Sleep apnea    Past Surgical History:  Procedure Laterality Date  . COLONOSCOPY    . POLYPECTOMY    . ROTATOR CUFF REPAIR     left  . THROAT SURGERY     polyps removed from vocal cord    reports that he quit smoking about 41 years ago. His smoking use included cigarettes. He has a 10.00 pack-year smoking history. He has never used smokeless tobacco. He reports that he does not drink alcohol or use drugs. family history is not on file. No Known Allergies Current Outpatient Medications on File Prior to Visit  Medication Sig Dispense Refill  . allopurinol (ZYLOPRIM) 300 MG tablet Take 1 tablet (300 mg total) by mouth daily. 90 tablet 3  .  aspirin 81 MG tablet Take 81 mg by mouth daily.      . colchicine 0.6 MG tablet TAKE 1 TABLET BY MOUTH ONCE DAILY 90 tablet 0  . finasteride (PROSCAR) 5 MG tablet Take 1 tablet (5 mg total) by mouth daily. 90 tablet 3  . fluticasone (FLONASE) 50 MCG/ACT nasal spray Place 2 sprays into the nose daily. 48 g 3  . glucosamine-chondroitin 500-400 MG tablet Take 1 tablet by mouth daily.      . indomethacin (INDOCIN) 50 MG capsule TAKE 1 CAPSULE BY MOUTH THREE TIMES DAILY AS NEEDED 270 capsule 0  . irbesartan (AVAPRO) 300 MG tablet Take 1 tablet (300 mg total) by mouth daily. 90 tablet 2  . loratadine (CLARITIN) 10 MG tablet Take 10 mg by mouth daily as needed for allergies.    . Multiple Vitamins-Minerals (CENTRUM MULTI + OMEGA 3 PO) Take by mouth.    . nabumetone (RELAFEN) 500 MG tablet TAKE 1 TABLET(500 MG) BY MOUTH DAILY 90 tablet 1  . pravastatin (PRAVACHOL) 40 MG tablet Take 1 tablet (40 mg total) by mouth daily. 90 tablet 2  . sildenafil (VIAGRA) 100 MG tablet Take 1 tablet (100 mg total) by mouth as needed for erectile dysfunction. Yearly physical w/labs due in February must see MD for refills 10 tablet 0  . tamsulosin (FLOMAX) 0.4 MG CAPS capsule TAKE 1 CAPSULE  BY MOUTH  DAILY 90 capsule 1  . verapamil (CALAN-SR) 240 MG CR tablet Take 1 tablet (240 mg total) by mouth at bedtime. 90 tablet 2   No current facility-administered medications on file prior to visit.     Observations/Objective: Alert, not ill appearing or overly fatigued or weakened, NAD, resp normal, cn 2-12 intact, moves all 4s, no visible rash or swelling Lab Results  Component Value Date   WBC 5.3 07/03/2018   HGB 13.3 07/03/2018   HCT 39.0 07/03/2018   PLT 90.0 (L) 07/03/2018   GLUCOSE 123 (H) 07/03/2018   CHOL 115 05/09/2018   TRIG 128.0 05/09/2018   HDL 25.20 (L) 05/09/2018   LDLDIRECT 62.0 04/26/2016   LDLCALC 64 05/09/2018   ALT 12 07/03/2018   AST 16 07/03/2018   NA 139 07/03/2018   K 3.0 (L) 07/03/2018   CL  102 07/03/2018   CREATININE 1.45 07/03/2018   BUN 16 07/03/2018   CO2 26 07/03/2018   TSH 1.14 05/09/2018   PSA 2.17 05/09/2018   INR 1.0 05/29/2018   HGBA1C 5.3 05/09/2018   Assessment and Plan: See notes  Follow Up Instructions: See notes   I discussed the assessment and treatment plan with the patient. The patient was provided an opportunity to ask questions and all were answered. The patient agreed with the plan and demonstrated an understanding of the instructions.   The patient was advised to call back or seek an in-person evaluation if the symptoms worsen or if the condition fails to improve as anticipated.   Cathlean Cower, MD

## 2018-07-03 NOTE — Telephone Encounter (Signed)
-----   Message from Biagio Borg, MD sent at 07/03/2018  3:01 PM EDT ----- Left message on MyChart, pt to cont same tx except  The test results show that your current treatment is OK, as the tests are stable, except for mild low potassium.  I will send a prescription for 3 days of potassium, and the office should call you as well.Redmond Baseman to please inform pt, I will do rx

## 2018-07-03 NOTE — Assessment & Plan Note (Signed)
Etiology unclear, suspect viral illness with paucity of symptomatology, for labs and cxr as ordered,  to f/u any worsening symptoms or concerns

## 2018-07-03 NOTE — Assessment & Plan Note (Signed)
Doing well with CPAP,  to f/u any worsening symptoms or concerns

## 2018-07-03 NOTE — Patient Instructions (Signed)
Please continue all other medications as before, and refills have been done if requested.  Please have the pharmacy call with any other refills you may need.  Please keep your appointments with your specialists as you may have planned  Please go to the XRAY Department in the Basement (go straight as you get off the elevator) for the x-ray testing  Please go to the LAB in the Basement (turn left off the elevator) for the tests to be done today  You will be contacted by phone if any changes need to be made immediately.  Otherwise, you will receive a letter about your results with an explanation, but please check with MyChart first.  Please remember to sign up for MyChart if you have not done so, as this will be important to you in the future with finding out test results, communicating by private email, and scheduling acute appointments online when needed.

## 2018-07-03 NOTE — Telephone Encounter (Signed)
Pt has viewed results via MyChart  

## 2018-07-03 NOTE — Assessment & Plan Note (Signed)
stable overall by history and exam, recent data reviewed with pt, and pt to continue medical treatment as before,  to f/u any worsening symptoms or concerns  

## 2018-07-09 ENCOUNTER — Other Ambulatory Visit: Payer: Self-pay | Admitting: Internal Medicine

## 2018-08-06 DIAGNOSIS — G4733 Obstructive sleep apnea (adult) (pediatric): Secondary | ICD-10-CM | POA: Diagnosis not present

## 2018-08-26 ENCOUNTER — Encounter: Payer: Self-pay | Admitting: Family Medicine

## 2018-09-02 ENCOUNTER — Other Ambulatory Visit: Payer: Self-pay | Admitting: *Deleted

## 2018-09-02 DIAGNOSIS — Z20822 Contact with and (suspected) exposure to covid-19: Secondary | ICD-10-CM

## 2018-09-07 LAB — NOVEL CORONAVIRUS, NAA: SARS-CoV-2, NAA: NOT DETECTED

## 2018-09-10 ENCOUNTER — Ambulatory Visit (INDEPENDENT_AMBULATORY_CARE_PROVIDER_SITE_OTHER): Payer: Medicare Other | Admitting: *Deleted

## 2018-09-10 DIAGNOSIS — Z0000 Encounter for general adult medical examination without abnormal findings: Secondary | ICD-10-CM

## 2018-09-10 NOTE — Progress Notes (Addendum)
Subjective:   Calvin [name]. is a 68 y.o. male who presents for Medicare Annual/Subsequent preventive examination.  I connected with patient 09/10/18 at  9:00 AM EDT by a video/audio enabled telemedicine application and verified that I am speaking with the correct person using two identifiers. Patient stated full name and DOB. Patient gave permission to continue with virtual visit. Patient's location was at home and Nurse's location was at [place] office.   Review of Systems:   Cardiac Risk Factors include: advanced age (>59men, >54 women);dyslipidemia;male gender;hypertension Sleep patterns: feels rested on waking, gets up 0-1 times nightly to void and sleeps 7-8 hours nightly. Wears C-PAP  Home Safety/Smoke Alarms: Feels safe in home. Smoke alarms in place.  Living environment; residence and Firearm Safety: 1-story house/ trailer. Lives with wife, no needs for DME, good support system Seat Belt Safety/Bike Helmet: Wears seat belt.    PSA-  Lab Results  Component Value Date   PSA 2.17 05/09/2018   PSA 3.24 05/01/2018   PSA 1.95 04/30/2017       Objective:    Vitals: There were no vitals taken for this visit.  There is no height or weight on file to calculate BMI.  Advanced Directives 09/10/2018 05/29/2018 09/07/2017 09/06/2016 04/13/2016 04/03/2016  Does Patient Have a Medical Advance Directive? Yes Yes Yes Yes Yes Yes  Type of Paramedic of [place];Living will [place];Living will Healthcare Power of [place];Living will [place];Living will [place];Living will  Copy of [place] in Chart? No - copy requested No - copy requested - No - copy requested - -    Tobacco Social History   Tobacco Use  Smoking Status Former Smoker  . Packs/day: 2.00  . Years: 5.00  . Pack years: 10.00  . Types: Cigarettes  . Quit date: 05/23/1977  . Years since  quitting: 41.3  Smokeless Tobacco Never Used     Counseling given: Not Answered  Past Medical History:  Diagnosis Date  . Allergy   . Arthritis   . Diverticulosis   . Gout   . Hyperlipidemia   . Hypertension   . OSA (obstructive sleep apnea) 11/13/2013  . Sickle cell trait ([place])   . Sleep apnea    Past Surgical History:  Procedure Laterality Date  . COLONOSCOPY    . POLYPECTOMY    . ROTATOR CUFF REPAIR     left  . THROAT SURGERY     polyps removed from vocal cord   Family History  Problem Relation Age of Onset  . Colon cancer Neg Hx    Social History   Socioeconomic History  . Marital status: Married    Spouse name: Not on file  . Number of children: 1  . Years of education: Not on file  . Highest education level: Not on file  Occupational History  . Occupation: retired  Scientific laboratory technician  . Financial resource strain: Not hard at all  . Food insecurity    Worry: Never true    Inability: Never true  . Transportation needs    Medical: No    Non-medical: No  Tobacco Use  . Smoking status: Former Smoker    Packs/day: 2.00    Years: 5.00    Pack years: 10.00    Types: Cigarettes    Quit date: 05/23/1977    Years since quitting: 41.3  . Smokeless tobacco: Never Used  Substance and  Sexual Activity  . Alcohol use: No    Comment: QUIT 2002  . Drug use: No    Comment: Completed program in april 2000  . Sexual activity: Yes  Lifestyle  . Physical activity    Days per week: 3 days    Minutes per session: 40 min  . Stress: Not at all  Relationships  . Social connections    Talks on phone: More than three times a week    Gets together: More than three times a week    Attends religious service: More than 4 times per year    Active member of club or organization: Yes    Attends meetings of clubs or organizations: More than 4 times per year    Relationship status: Married  Other Topics Concern  . Not on file  Social History Narrative  . Not on file     Outpatient Encounter Medications as of 09/10/2018  Medication Sig  . allopurinol (ZYLOPRIM) 300 MG tablet Take 1 tablet (300 mg total) by mouth daily.  Marland Kitchen aspirin 81 MG tablet Take 81 mg by mouth daily.    . colchicine 0.6 MG tablet Take 1 tablet by mouth once daily  . finasteride (PROSCAR) 5 MG tablet Take 1 tablet (5 mg total) by mouth daily.  . fluticasone (FLONASE) 50 MCG/ACT nasal spray Place 2 sprays into the nose daily. (Patient taking differently: Place 2 sprays into the nose daily. )  . indomethacin (INDOCIN) 50 MG capsule TAKE 1 CAPSULE BY MOUTH THREE TIMES DAILY AS NEEDED  . irbesartan (AVAPRO) 300 MG tablet Take 1 tablet (300 mg total) by mouth daily.  Marland Kitchen loratadine (CLARITIN) 10 MG tablet Take 10 mg by mouth daily as needed for allergies.  . Multiple Vitamins-Minerals (CENTRUM MULTI + OMEGA 3 PO) Take by mouth.  . nabumetone (RELAFEN) 500 MG tablet TAKE 1 TABLET(500 MG) BY MOUTH DAILY  . potassium chloride SA (K-DUR) 20 MEQ tablet 1 tab by mouth for 3 days  . pravastatin (PRAVACHOL) 40 MG tablet Take 1 tablet (40 mg total) by mouth daily.  . sildenafil (VIAGRA) 100 MG tablet Take 1 tablet (100 mg total) by mouth as needed for erectile dysfunction. Yearly physical w/labs due in February must see MD for refills  . tamsulosin (FLOMAX) 0.4 MG CAPS capsule TAKE 1 CAPSULE BY MOUTH  DAILY  . verapamil (CALAN-SR) 240 MG CR tablet Take 1 tablet (240 mg total) by mouth at bedtime.  . [DISCONTINUED] glucosamine-chondroitin 500-400 MG tablet Take 1 tablet by mouth daily.     No facility-administered encounter medications on file as of 09/10/2018.     Activities of Daily Living In your present state of health, do you have any difficulty performing the following activities: 09/10/2018  Hearing? N  Vision? N  Difficulty concentrating or making decisions? N  Walking or climbing stairs? N  Dressing or bathing? N  Doing errands, shopping? N  Preparing Food and eating ? N  Using the Toilet? N   In the past six months, have you accidently leaked urine? N  Do you have problems with loss of bowel control? N  Managing your Medications? N  Managing your Finances? N  Housekeeping or managing your Housekeeping? N  Some recent data might be hidden    Patient Care Team: Biagio Borg, MD as PCP - Patric Dykes, MD as Consulting Physician (Urology) Netta Cedars, MD as Consulting Physician (Orthopedic Surgery)   Assessment:   This is a routine wellness examination  for Calvin [name]. Physical assessment deferred to PCP.   Exercise Activities and Dietary recommendations Current Exercise Habits: Home exercise routine, Type of exercise: walking, Time (Minutes): 35, Frequency (Times/Week): 4, Weekly Exercise (Minutes/Week): 140, Intensity: Mild, Exercise limited by: orthopedic condition(s)  Diet (meal preparation, eat out, water intake, caffeinated beverages, dairy products, fruits and vegetables): in general, a "healthy" diet  , well balanced.   Reviewed heart healthy diet. Encouraged patient to maintain daily water and healthy fluid intake.  Goals    . Patient Stated     I want to eat dinner earlier before 6:00, exercise routinely 3 times weekly. Enjoy life and family. Research better choices of desserts by looking up healthier  Recipes on line sub. With fruit.     . Start to exercise on a regular basis and lose weight     I will start to go the gym 3 times per week, reduce dessert intake to 3 times a week.       Fall Risk Fall Risk  09/10/2018 09/07/2017 05/08/2017 09/06/2016 05/04/2016  Falls in the past year? 0 No No Yes Yes  Number falls in past yr: 0 - - 1 1  Injury with Fall? - - - Yes Yes  Comment - - - - saw Dr Norris/ortho pain to left shoulder after injury due to fall , with hx of left shoulder surgury  Follow up - - - Falls prevention discussed -    Depression Screen PHQ 2/9 Scores 09/10/2018 05/09/2018 09/07/2017 05/08/2017  PHQ - 2 Score 0 0 0 0    Cognitive  Function MMSE - Mini Mental State Exam 09/07/2017  Orientation to time 5  Orientation to Place 5  Registration 3  Attention/ Calculation 5  Recall 3  Language- name 2 objects 2  Language- repeat 1  Language- follow 3 step command 3  Language- read & follow direction 1  Write a sentence 1  Copy design 1  Total score 30       Ad8 score reviewed for issues:  Issues making decisions: no  Less interest in hobbies / activities: no  Repeats questions, stories (family complaining): no  Trouble using ordinary gadgets (microwave, computer, phone):no  Forgets the month or year: no  Mismanaging finances: no  Remembering appts: no  Daily problems with thinking and/or memory: no Ad8 score is= 0  Immunization History  Administered Date(s) Administered  . H1N1 03/30/2008  . Influenza Split 01/18/2012, 11/11/2013  . Influenza Whole 01/14/2007, 01/11/2009  . Influenza, High Dose Seasonal PF 01/03/2016  . Influenza-Unspecified 02/02/2015, 01/04/2017, 01/02/2018  . Pneumococcal Conjugate-13 08/27/2013  . Pneumococcal Polysaccharide-23 12/10/2013  . Td 10/19/2007  . Tdap 05/09/2018  . Zoster 04/01/2009   Screening Tests Health Maintenance  Topic Date Due  . INFLUENZA VACCINE  10/12/2018  . COLONOSCOPY  04/13/2026  . TETANUS/TDAP  05/09/2028  . Hepatitis C Screening  Completed  . PNA vac Low Risk Adult  Completed      Plan:    Reviewed health maintenance screenings with patient today and relevant education, vaccines, and/or referrals were provided.   Continue to eat heart healthy diet (full of fruits, vegetables, whole grains, lean protein, water--limit salt, fat, and sugar intake) and increase physical activity as tolerated.  Continue doing brain stimulating activities (puzzles, reading, adult coloring books, staying active) to keep memory sharp.   I have personally reviewed and noted the following in the patient's chart:   . Medical and social history . Use of  alcohol, tobacco  or illicit drugs  . Current medications and supplements . Functional ability and status . Nutritional status . Physical activity . Advanced directives . List of other physicians . Screenings to include cognitive, depression, and falls . Referrals and appointments  In addition, I have reviewed and discussed with patient certain preventive protocols, quality metrics, and best practice recommendations. A written personalized care plan for preventive services as well as general preventive health recommendations were provided to patient.     Michiel Cowboy, RN  09/10/2018  Medical screening examination/treatment/procedure(s) were performed by non-physician practitioner and as supervising physician I was immediately available for consultation/collaboration. I agree with above. Cathlean Cower, MD

## 2018-09-10 NOTE — Patient Instructions (Signed)
If you cannot attend class in person, you can still exercise at home. Video taped versions of [employer] classes are shown on [employer] (GTN) at 8 am and 1 pm Mondays through Fridays. You can also purchase a copy of the [facility] DVD by calling [employer] (GTN) [employer]. GTN is available on [employer] channel 13 with a digital cable box and on [employer] channel 31. GTN is also available on [employer], channel 99. To view GTN, go to channel 99, press OK, select [place], then select GT[employer] to start the channel.

## 2018-09-12 ENCOUNTER — Other Ambulatory Visit: Payer: Self-pay

## 2018-09-12 ENCOUNTER — Encounter: Payer: Self-pay | Admitting: Pulmonary Disease

## 2018-09-12 ENCOUNTER — Ambulatory Visit (INDEPENDENT_AMBULATORY_CARE_PROVIDER_SITE_OTHER): Payer: Medicare Other | Admitting: Pulmonary Disease

## 2018-09-12 ENCOUNTER — Ambulatory Visit: Payer: Medicare Other | Admitting: Pulmonary Disease

## 2018-09-12 DIAGNOSIS — G4733 Obstructive sleep apnea (adult) (pediatric): Secondary | ICD-10-CM | POA: Diagnosis not present

## 2018-09-12 NOTE — Progress Notes (Signed)
[facility]  Chief Complaint  Patient presents with  . Sleep Apnea    Constitutional:  There were no vitals taken for this visit.  Deferred.  Past Medical History:  Sickle Cell Trait, HTN, HLD, Gout, Diverticulosis, OA, Allergies  Brief Summary:  Calvin [name]. is a 68 y.o. male with obstructive sleep apnea.  Virtual Visit via Telephone Note  I connected with Calvin [name]. on 09/12/18 at 10:15 AM EDT by telephone and verified that I am speaking with the correct person using two identifiers.  Location: Patient: Home Provider: Office   I discussed the limitations, risks, security and privacy concerns of performing an evaluation and management service by telephone and the availability of in person appointments. I also discussed with the patient that there may be a patient responsible charge related to this service. The patient expressed understanding and agreed to proceed.  He has been doing well with CPAP.  No issues with mask fit.  Denies sinus congestion, sore throat, dry mouth, or aerophagia.  Gets about 7 to 8 hours sleep.  Doesn't usually nap.  Saw hematologist recently for assessment of thrombocytopenia.  Physical Exam:  Deferred.    Assessment/Plan:   Obstructive sleep apnea. - he is compliant with CPAP and reports benefit from therapy - continue Auto CPAP - he should be eligible for new machine in 2021  Follow Up Instructions:    I discussed the assessment and treatment plan with the patient. The patient was provided an opportunity to ask questions and all were answered. The patient agreed with the plan and demonstrated an understanding of the instructions.   The patient was advised to call back or seek an in-person evaluation if the symptoms worsen or if the condition fails to improve as anticipated.  I provided 14 minutes of non-face-to-face time during this encounter.   Patient Instructions  Follow  up in 1 year    Calvin Mires, MD [place] Pager: [phone] 09/12/2018, 10:42 AM  Flow Sheet    Sleep tests:  HST 12/29/13 >> AHI 62.8, SaO2 low 71%. Auto CPAP6/01/20 to 09/10/18 >> used on 30 of 30 nights with average 7 hrs 33 min. Average AHI 2.4 with median CPAP 10 and 95 th percentile CPAP 13 cm H2O  Medications:   Allergies as of 09/12/2018   No Known Allergies     Medication List       Accurate as of September 12, 2018 10:42 AM. If you have any questions, ask your nurse or doctor.        allopurinol 300 MG tablet Commonly known as: ZYLOPRIM Take 1 tablet (300 mg total) by mouth daily.   aspirin 81 MG tablet Take 81 mg by mouth daily.   CENTRUM MULTI + OMEGA 3 PO Take by mouth.   colchicine 0.6 MG tablet Take 1 tablet by mouth once daily   finasteride 5 MG tablet Commonly known as: PROSCAR Take 1 tablet (5 mg total) by mouth daily.   fluticasone 50 MCG/ACT nasal spray Commonly known as: FLONASE Place 2 sprays into the nose daily.   indomethacin 50 MG capsule Commonly known as: INDOCIN TAKE 1 CAPSULE BY MOUTH THREE TIMES DAILY AS NEEDED   irbesartan 300 MG tablet Commonly known as: AVAPRO Take 1 tablet (300 mg total) by mouth daily.   loratadine 10 MG tablet Commonly known as: CLARITIN Take 10 mg by mouth daily as needed for allergies.   nabumetone 500 MG  tablet Commonly known as: RELAFEN TAKE 1 TABLET(500 MG) BY MOUTH DAILY   potassium chloride SA 20 MEQ tablet Commonly known as: K-DUR 1 tab by mouth for 3 days   pravastatin 40 MG tablet Commonly known as: PRAVACHOL Take 1 tablet (40 mg total) by mouth daily.   sildenafil 100 MG tablet Commonly known as: Viagra Take 1 tablet (100 mg total) by mouth as needed for erectile dysfunction. Yearly physical w/labs due in February must see MD for refills   tamsulosin 0.4 MG Caps capsule Commonly known as: FLOMAX TAKE 1 CAPSULE BY MOUTH  DAILY   verapamil 240 MG CR tablet  Commonly known as: CALAN-SR Take 1 tablet (240 mg total) by mouth at bedtime.       Past Surgical History:  He  has a past surgical history that includes Rotator cuff repair; Throat surgery; Polypectomy; and Colonoscopy.  Family History:  His family history is not on file.  Social History:  He  reports that he quit smoking about 41 years ago. His smoking use included cigarettes. He has a 10.00 pack-year smoking history. He has never used smokeless tobacco. He reports that he does not drink alcohol or use drugs.

## 2018-09-12 NOTE — Patient Instructions (Signed)
Follow up in 1 year.

## 2018-09-16 ENCOUNTER — Telehealth: Payer: Self-pay | Admitting: Internal Medicine

## 2018-09-16 NOTE — Telephone Encounter (Signed)
Returned patients phone call regarding scheduling an appointment, lab appointment has been added on 07/08 per patient's request.

## 2018-09-18 ENCOUNTER — Other Ambulatory Visit: Payer: Self-pay

## 2018-09-18 ENCOUNTER — Inpatient Hospital Stay: Payer: Medicare Other | Attending: Internal Medicine

## 2018-09-18 DIAGNOSIS — D472 Monoclonal gammopathy: Secondary | ICD-10-CM | POA: Insufficient documentation

## 2018-09-18 DIAGNOSIS — D508 Other iron deficiency anemias: Secondary | ICD-10-CM | POA: Diagnosis not present

## 2018-09-18 DIAGNOSIS — D696 Thrombocytopenia, unspecified: Secondary | ICD-10-CM

## 2018-09-18 LAB — CBC WITH DIFFERENTIAL (CANCER CENTER ONLY)
Abs Immature Granulocytes: 0.01 10*3/uL (ref 0.00–0.07)
Basophils Absolute: 0 10*3/uL (ref 0.0–0.1)
Basophils Relative: 0 %
Eosinophils Absolute: 0 10*3/uL (ref 0.0–0.5)
Eosinophils Relative: 1 %
HCT: 37 % — ABNORMAL LOW (ref 39.0–52.0)
Hemoglobin: 12.6 g/dL — ABNORMAL LOW (ref 13.0–17.0)
Immature Granulocytes: 0 %
Lymphocytes Relative: 39 %
Lymphs Abs: 1.3 10*3/uL (ref 0.7–4.0)
MCH: 30.1 pg (ref 26.0–34.0)
MCHC: 34.1 g/dL (ref 30.0–36.0)
MCV: 88.5 fL (ref 80.0–100.0)
Monocytes Absolute: 0.2 10*3/uL (ref 0.1–1.0)
Monocytes Relative: 6 %
Neutro Abs: 1.9 10*3/uL (ref 1.7–7.7)
Neutrophils Relative %: 54 %
Platelet Count: 113 10*3/uL — ABNORMAL LOW (ref 150–400)
RBC: 4.18 MIL/uL — ABNORMAL LOW (ref 4.22–5.81)
RDW: 13.8 % (ref 11.5–15.5)
WBC Count: 3.5 10*3/uL — ABNORMAL LOW (ref 4.0–10.5)
nRBC: 0 % (ref 0.0–0.2)

## 2018-09-18 LAB — CMP (CANCER CENTER ONLY)
ALT: 21 U/L (ref 0–44)
AST: 24 U/L (ref 15–41)
Albumin: 4.1 g/dL (ref 3.5–5.0)
Alkaline Phosphatase: 69 U/L (ref 38–126)
Anion gap: 9 (ref 5–15)
BUN: 16 mg/dL (ref 8–23)
CO2: 24 mmol/L (ref 22–32)
Calcium: 8.3 mg/dL — ABNORMAL LOW (ref 8.9–10.3)
Chloride: 111 mmol/L (ref 98–111)
Creatinine: 1.28 mg/dL — ABNORMAL HIGH (ref 0.61–1.24)
GFR, Est AFR Am: >60 mL/min (ref >60)
GFR, Estimated: 56 mL/min — ABNORMAL LOW (ref >60)
Glucose, Bld: 111 mg/dL — ABNORMAL HIGH (ref 70–99)
Potassium: 3.3 mmol/L — ABNORMAL LOW (ref 3.5–5.1)
Sodium: 144 mmol/L (ref 135–145)
Total Bilirubin: 0.5 mg/dL (ref 0.3–1.2)
Total Protein: 7.1 g/dL (ref 6.5–8.1)

## 2018-09-18 LAB — FERRITIN: Ferritin: 21 ng/mL — ABNORMAL LOW (ref 24–336)

## 2018-09-18 LAB — LACTATE DEHYDROGENASE: LDH: 167 U/L (ref 98–192)

## 2018-09-19 LAB — KAPPA/LAMBDA LIGHT CHAINS
Kappa free light chain: 38.8 mg/L — ABNORMAL HIGH (ref 3.3–19.4)
Kappa, lambda light chain ratio: 2.2 — ABNORMAL HIGH (ref 0.26–1.65)
Lambda free light chains: 17.6 mg/L (ref 5.7–26.3)

## 2018-09-19 LAB — IGG, IGA, IGM
IgA: 155 mg/dL (ref 61–437)
IgG (Immunoglobin G), Serum: 1180 mg/dL (ref 603–1613)
IgM (Immunoglobulin M), Srm: 105 mg/dL (ref 20–172)

## 2018-09-20 ENCOUNTER — Ambulatory Visit (HOSPITAL_BASED_OUTPATIENT_CLINIC_OR_DEPARTMENT_OTHER): Payer: Medicare Other | Admitting: Internal Medicine

## 2018-09-20 ENCOUNTER — Encounter: Payer: Self-pay | Admitting: Internal Medicine

## 2018-09-20 ENCOUNTER — Other Ambulatory Visit: Payer: Self-pay | Admitting: Internal Medicine

## 2018-09-20 DIAGNOSIS — D472 Monoclonal gammopathy: Secondary | ICD-10-CM

## 2018-09-20 DIAGNOSIS — D509 Iron deficiency anemia, unspecified: Secondary | ICD-10-CM

## 2018-09-20 DIAGNOSIS — D508 Other iron deficiency anemias: Secondary | ICD-10-CM | POA: Diagnosis not present

## 2018-09-20 HISTORY — DX: Iron deficiency anemia, unspecified: D50.9

## 2018-09-20 NOTE — Progress Notes (Signed)
Virtual Visit via Telephone Note  I connected with Calvin [name]. on 09/20/18 at  1:30 PM EDT by telephone and verified that I am speaking with the correct person using two identifiers.   I discussed the limitations, risks, security and privacy concerns of performing an evaluation and management service by telephone and the availability of in person appointments. I also discussed with the patient that there may be a patient responsible charge related to this service. The patient expressed understanding and agreed to proceed.  Interval History:  Historical data obtained from note dated 05/29/2018.  68 year old male referred for evaluation due to thrombocytopenia.  Pt had labs done 04/30/2017 that showed WBC 3.9 HB 13.1 plts 131,000.  He had normal differential.  Chemistries WNL with K+ 3.6 Cr 1.31 normal LFTs.  Labs done 05/01/2018 showed WBC 3.8 HB 13.5 plts 88,000.  Normal differential.  Labs done 05/09/2018 showed WBC 4.4 HB 13.2 plts 76,000.  Normal differential.  Chemistries WNL with K+ 3.6 Cr 1.18 normal LFTs.  Labs done 05/14/2018 showed WBC 4.2 HB 13.2 plts 86,000. Normal differential.    Pt had been taking Indocin for quite some time due to joint pain.  He is now on Relafen.  He reports joint pain and reported diagnosis of gout.  He recently traveled to [place] for a bike week event.  He denies any bruising or bleeding.  He has a remote history of smoking and reports he last smoked in 1978.  He reports prior history of heavy alcohol use.  Pt denies any family history of leukemias or lymphomas.  He denies fevers, chills, night sweats, adenopathy, weight loss.  Last colonoscopy was 04/13/2016 and showed 2 polyps, diverticulosis and hemorrhoids.  Pathology returned as hyperplastic polyps.     Observations/Objective: Review of labs and scans.     Assessment and Plan: 1.  Thrombocytopenia.   68 year old male referred for evaluation due to thrombocytopenia.  Pt had labs done 04/30/2017 that showed WBC  3.9 HB 13.1 plts 131,000.  He had normal differential.  Chemistries WNL with K+ 3.6 Cr 1.31 normal LFTs.  Labs done 05/01/2018 showed WBC 3.8 HB 13.5 plts 88,000.  Normal differential.  Labs done 05/09/2018 showed WBC 4.4 HB 13.2 plts 76,000.  Normal differential.  Chemistries WNL with K+ 3.6 Cr 1.18 normal LFTs.  Labs done 05/14/2018 showed WBC 4.2 HB 13.2 plts 86,000. Normal differential.    Pt had been taking Indocin for quite some time due to joint pain.  He is now on Relafen.  He reports joint pain and reported diagnosis of gout.  He recently traveled to [place] for a bike week event.  He denies any bruising or bleeding.  He has a remote history of smoking and reports he last smoked in 1978.  He reports prior history of heavy alcohol use.  Pt denies any family history of leukemias or lymphomas.  He denies fevers, chills, night sweats, adenopathy, weight loss.  Last colonoscopy was 04/13/2016 and showed 2 polyps, diverticulosis and hemorrhoids.  Pathology returned as hyperplastic polyps.  Pt is seen today for consultation due to thrombocytopenia.    Labs done 05/29/2018 showed WBC 3.4 HB 13.1 plts 135,000.  Pt has normal differential.  Chemistries WNL with K+ 3.5 Cr 1.16 and normal LFTs.  PT 13.5 PTT 30. Uric acid 4.7.  Platelet count improved at 135,000.  Suspect normal variant or due to long standing NSAID use with indocin.    Pt has + Hepatitis  B core ab indicating prior exposure.  HIV is negative.    Labs done 09/18/2018 reviewed and showed WBC 3.5 HB 12.6 plts 113,000.  Chemistries showed K+ 3.3 Cr 1.28 and normal LFTs.  Plt count adequate at 113,000.  Will repeat labs in 11/2018.  2.  Joint pain.  Pt has normal Uric acid level.  He has normal CRP.  He has normal RF, ANA.  SPEP shows faint monclonal protein measuring 0.2 g/dl.  Will repeat SPEP in 11/2018.  Follow-up with PCP if worsening symptoms.    3.  Abdominal pain and history of alcohol use.  CT abdomen and pelvis due to history of thrombocytopenia  to evaluate liver and spleen was done 06/05/2018 and showed  IMPRESSION: 1. No acute findings are noted in the abdomen or pelvis. 2. Colonic diverticulosis without evidence of acute diverticulitis at this time. 3. Aortic atherosclerosis, in addition to at least 2 vessel coronary artery disease.   Hepatitis panel showed + Hep B core ab indicating prior exposure.  Follow-up with PCP if ongoing symptoms.    4.  Monoclonal gammopathy.  SPEP done 05/2018 showed a faint M-spike measuring 0.2 g/dl. Faint band in gamma region suspicious for monoclonal immunoglobulin.  This band may represent a benign spike or paraprotein.  He has normal Quant IG levels.   FLC ratio is 2.2.  He has Cr 1.28, Calcium level of 8.3 and HB 12.6.  Pt will have repeat labs in 11/2018 for ongoing monitoring of monoclonal gammopathy.    5.  Microcytic anemia.  HB stable at 12.6.  Ferritin remains decreased at 21 on labs done 09/18/2018.  Ferritin was previously 18 in 05/2018.  Pt has been on oral iron with no improvement in ferritin levels.  He has an intolerance and lack of response to oral iron.  Pt is recommended for Feraheme 510 mg IV D1 and D8.  Side effects of IV iron reviewed with pt and include possible allergic reaction.  Pt is referred to GI.  He will have repeat labs in 11/2018.  HB electrophoresis done 05/29/2018 confirms sickle cell trait.   6.  Obstructive sleep apnea.  Follow-up with pulmonary or PCP for management.    7.  Health maintenance.  Last colonosocopy was in 04/2016 and showed hyperplastic polyps and hemorrhoids.  Pt referred back to GI due to persistent iron deficiency.     Follow Up Instructions:  2 doses of IV iron.  GI referral.  Repeat labs in 11/2018.      I discussed the assessment and treatment plan with the patient. The patient was provided an opportunity to ask questions and all were answered. The patient agreed with the plan and demonstrated an understanding of the instructions.   The patient was  advised to call back or seek an in-person evaluation if the symptoms worsen or if the condition fails to improve as anticipated.  I provided 21 minutes of non-face-to-face time during this encounter.   Zoila Shutter, MD

## 2018-09-23 ENCOUNTER — Telehealth: Payer: Self-pay | Admitting: *Deleted

## 2018-09-23 NOTE — Telephone Encounter (Signed)
Received call from patient requesting that his lab results from 09/18/18 be released to his MyChart. Advised that the MD has to release them to MyChart.  Advised pt that she would be back in the office on 09/25/18. Pt voiced understanding Message sent to Dr. Walden Field for her to release these.

## 2018-09-24 LAB — PROTEIN ELECTROPHORESIS, SERUM, WITH REFLEX
A/G Ratio: 1.4 (ref 0.7–1.7)
Albumin ELP: 4 g/dL (ref 2.9–4.4)
Alpha-1-Globulin: 0.2 g/dL (ref 0.0–0.4)
Alpha-2-Globulin: 0.6 g/dL (ref 0.4–1.0)
Beta Globulin: 0.8 g/dL (ref 0.7–1.3)
Gamma Globulin: 1.2 g/dL (ref 0.4–1.8)
Globulin, Total: 2.8 g/dL (ref 2.2–3.9)
M-Spike, %: 0.2 g/dL — ABNORMAL HIGH
SPEP Interpretation: 0
Total Protein ELP: 6.8 g/dL (ref 6.0–8.5)

## 2018-09-24 LAB — IMMUNOFIXATION REFLEX, SERUM
IgA: 180 mg/dL (ref 61–437)
IgG (Immunoglobin G), Serum: 1525 mg/dL (ref 603–1613)
IgM (Immunoglobulin M), Srm: 126 mg/dL (ref 20–172)

## 2018-09-25 ENCOUNTER — Other Ambulatory Visit: Payer: Self-pay

## 2018-09-25 NOTE — Patient Outreach (Signed)
[facility]) Care Management  09/25/2018  Calvin Quant. 1950/04/24 [id number]   Medication Adherence call to [name] [facility] Identifiers Verify spoke with patient he is past due on Pravastatin 40 mg patient explain he is taking 1 tablet daily and has medication at this time patient explain at the beginning of the year he received a duplicate order. [name] is showing past due under [place].   [facility] Direct Dial [phone]  Fax [phone] [email or web address]

## 2018-10-14 ENCOUNTER — Ambulatory Visit: Payer: Medicare Other | Admitting: Internal Medicine

## 2018-10-14 ENCOUNTER — Encounter: Payer: Self-pay | Admitting: Internal Medicine

## 2018-10-14 ENCOUNTER — Other Ambulatory Visit: Payer: Self-pay

## 2018-10-14 VITALS — BP 152/86 | HR 72 | Temp 97.7°F | Ht 67.5 in | Wt 280.2 lb

## 2018-10-14 DIAGNOSIS — Z86010 Personal history of colonic polyps: Secondary | ICD-10-CM

## 2018-10-14 DIAGNOSIS — D500 Iron deficiency anemia secondary to blood loss (chronic): Secondary | ICD-10-CM | POA: Diagnosis not present

## 2018-10-14 MED ORDER — NA SULFATE-K SULFATE-MG SULF 17.5-3.13-1.6 GM/177ML PO SOLN: 1.0000 | Freq: Once | ORAL | 0 refills | Status: AC

## 2018-10-14 NOTE — Patient Instructions (Signed)

## 2018-10-14 NOTE — Progress Notes (Signed)
HISTORY OF PRESENT ILLNESS:  Calvin Wasco. is a 68 y.o. male with past medical history as listed below who was sent today by hematology Dr. Walden Field regarding iron deficiency anemia.  Patient was being evaluated by hematology for thrombocytopenia.  Extensive work-up revealed blood work from February 2020 with hemoglobin 13.5, platelets 88,000, white blood cell count 3.8.  Normal differential.  Low ferritin level of 18 and 21 in March and July respectively.  For this he is referred.  His GI review of systems is entirely negative.  Review of relevant x-rays includes a CT scan of the abdomen pelvis with contrast.  No acute findings.  Incidental diverticulosis present.  No hepatic abnormalities.  Patient does have a history of adenomatous colon polyps.  Previous colonoscopy 1999, 2002, 2007, 2012, and 2018 (hyperplastic polyps and diverticulosis throughout as well as internal hemorrhoids.).  Patient has been on several NSAIDs for wrist and knee pain including Indocin and Relafen.  He is also on baby aspirin.  He takes allopurinol for history of gout.  No PPI therapy.  He specifically denies melena or hematochezia.  Of importance, patient was a blood donor for many many years and has not donated blood in 2-3 years.  REVIEW OF SYSTEMS:  All non-GI ROS negative unless otherwise stated in the HPI except for allergies, arthritis  Past Medical History:  Diagnosis Date  . Allergy   . Arthritis   . Diverticulosis   . Gout   . Hyperlipidemia   . Hypertension   . Iron deficiency anemia 09/20/2018  . OSA (obstructive sleep apnea) 11/13/2013  . Sickle cell trait ([place])   . Sleep apnea     Past Surgical History:  Procedure Laterality Date  . COLONOSCOPY    . POLYPECTOMY    . ROTATOR CUFF REPAIR     left  . THROAT SURGERY     polyps removed from vocal cord    Social History Calvin [name].  reports that he quit smoking about 41 years ago. His smoking use included cigarettes. He has a 10.00  pack-year smoking history. He has never used smokeless tobacco. He reports that he does not drink alcohol or use drugs.  family history is not on file.  No Known Allergies     PHYSICAL EXAMINATION: Vital signs: BP (!) 152/86 (BP Location: Left Arm, Patient Position: Sitting, Cuff Size: Large)   Pulse 72   Temp 97.7 F (36.5 C)   Ht 5' 7.5" (1.715 m) Comment: height measured without shoes  Wt 280 lb 4 oz (127.1 kg)   BMI 43.25 kg/m   Constitutional: Pleasant, generally well-appearing, no acute distress Psychiatric: alert and oriented x3, cooperative Eyes: extraocular movements intact, anicteric, conjunctiva pink Mouth: oral pharynx moist, no lesions Neck: supple no lymphadenopathy Cardiovascular: heart regular rate and rhythm, no murmur Lungs: clear to auscultation bilaterally Abdomen: soft, obese, nontender, nondistended, no obvious ascites, no peritoneal signs, normal bowel sounds, no organomegaly Rectal: Deferred until colonoscopy Extremities: no clubbing, cyanosis, or lower extremity edema bilaterally Skin: no lesions on visible extremities Neuro: No focal deficits.  Cranial nerves intact  ASSESSMENT:  1.  Iron deficiency with low ferritin level. 2.  History of adenomatous hyperplastic colon polyps.  Multiple prior exams as noted.  Last examination February 2018.  Rule out interval neoplasia 3.  Multiple medical problems including obesity and sleep apnea   PLAN:  1.  Schedule colonoscopy to evaluate iron deficiency.  Patient is high risk given his body habitus and  sleep apnea.The nature of the procedure, as well as the risks, benefits, and alternatives were carefully and thoroughly reviewed with the patient. Ample time for discussion and questions allowed. The patient understood, was satisfied, and agreed to proceed. 2.  Schedule upper endoscopy to evaluate iron deficiency.  Patient is high risk as above.The nature of the procedure, as well as the risks, benefits, and  alternatives were carefully and thoroughly reviewed with the patient. Ample time for discussion and questions allowed. The patient understood, was satisfied, and agreed to proceed.

## 2018-10-18 ENCOUNTER — Other Ambulatory Visit: Payer: Self-pay | Admitting: Internal Medicine

## 2018-11-05 ENCOUNTER — Encounter: Payer: Self-pay | Admitting: Internal Medicine

## 2018-11-06 DIAGNOSIS — G4733 Obstructive sleep apnea (adult) (pediatric): Secondary | ICD-10-CM | POA: Diagnosis not present

## 2018-11-07 ENCOUNTER — Ambulatory Visit: Payer: Medicare Other | Admitting: Internal Medicine

## 2018-11-18 ENCOUNTER — Telehealth: Payer: Self-pay

## 2018-11-18 NOTE — Telephone Encounter (Signed)
Covid-19 screening questions   Do you now or have you had a fever in the last 14 days?  Do you have any respiratory symptoms of shortness of breath or cough now or in the last 14 days?  Do you have any family members or close contacts with diagnosed or suspected Covid-19 in the past 14 days?  Have you been tested for Covid-19 and found to be positive?       

## 2018-11-18 NOTE — Telephone Encounter (Signed)
Patient called back and answered no to all questions. °

## 2018-11-19 ENCOUNTER — Encounter: Payer: Self-pay | Admitting: Internal Medicine

## 2018-11-19 ENCOUNTER — Ambulatory Visit (AMBULATORY_SURGERY_CENTER): Payer: Medicare Other | Admitting: Internal Medicine

## 2018-11-19 ENCOUNTER — Other Ambulatory Visit: Payer: Self-pay

## 2018-11-19 VITALS — BP 141/96 | HR 82 | Temp 96.7°F | Resp 12 | Ht 67.5 in | Wt 280.0 lb

## 2018-11-19 DIAGNOSIS — Z86010 Personal history of colonic polyps: Secondary | ICD-10-CM

## 2018-11-19 DIAGNOSIS — K5730 Diverticulosis of large intestine without perforation or abscess without bleeding: Secondary | ICD-10-CM | POA: Diagnosis not present

## 2018-11-19 DIAGNOSIS — K2970 Gastritis, unspecified, without bleeding: Secondary | ICD-10-CM

## 2018-11-19 DIAGNOSIS — D500 Iron deficiency anemia secondary to blood loss (chronic): Secondary | ICD-10-CM | POA: Diagnosis not present

## 2018-11-19 DIAGNOSIS — K2950 Unspecified chronic gastritis without bleeding: Secondary | ICD-10-CM | POA: Diagnosis not present

## 2018-11-19 DIAGNOSIS — B9681 Helicobacter pylori [H. pylori] as the cause of diseases classified elsewhere: Secondary | ICD-10-CM | POA: Diagnosis not present

## 2018-11-19 MED ORDER — PANTOPRAZOLE SODIUM 40 MG PO TBEC: 40.0000 mg | DELAYED_RELEASE_TABLET | Freq: Every day | ORAL | 11 refills | Status: DC

## 2018-11-19 MED ORDER — SODIUM CHLORIDE 0.9 % IV SOLN: 500.0000 mL | Freq: Once | INTRAVENOUS | Status: DC

## 2018-11-19 NOTE — Progress Notes (Signed)
PT taken to PACU. Monitors in place. VSS. Report given to RN. 

## 2018-11-19 NOTE — Progress Notes (Signed)
Called to room to assist during endoscopic procedure.  Patient ID and intended procedure confirmed with present staff. Received instructions for my participation in the procedure from the performing physician.  

## 2018-11-19 NOTE — Progress Notes (Signed)
Patient noted that he has been gassy in the last few weeks but has not historically been gassy.

## 2018-11-19 NOTE — Op Note (Signed)
[facility] Patient Name: Calvin [name] Procedure Date: 11/19/2018 8:09 AM MRN: [id number] Endoscopist: Docia Chuck. Henrene Pastor , MD Age: 68 Referring MD:  Date of Birth: 1951/01/13 Gender: Male Account #: [id number] Procedure:                Upper GI endoscopy Indications:              Iron deficiency Medicines:                Monitored Anesthesia Care Procedure:                Pre-Anesthesia Assessment:                           - Prior to the procedure, a History and Physical                            was performed, and patient medications and                            allergies were reviewed. The patient's tolerance of                            previous anesthesia was also reviewed. The risks                            and benefits of the procedure and the sedation                            options and risks were discussed with the patient.                            All questions were answered, and informed consent                            was obtained. Prior Anticoagulants: The patient has                            taken no previous anticoagulant or antiplatelet                            agents. ASA Grade Assessment: II - A patient with                            mild systemic disease. After reviewing the risks                            and benefits, the patient was deemed in                            satisfactory condition to undergo the procedure.                           After obtaining informed consent, the endoscope was  passed under direct vision. Throughout the                            procedure, the patient's blood pressure, pulse, and                            oxygen saturations were monitored continuously. The                            Endoscope was introduced through the mouth, and                            advanced to the second part of duodenum. The upper                            GI endoscopy was accomplished without  difficulty.                            The patient tolerated the procedure well. Scope In: Scope Out: Findings:                 The esophagus was normal.                           The stomach revealed diffuse gastritis as                            manifested by atrophic erythematous mucosa. As                            well, in the antrum was linear erythema suggestive                            of GAVE. Biopsies were taken with a cold forceps                            for histology.                           The examined duodenum was normal.                           The cardia and gastric fundus were normal on                            retroflexion. Complications:            No immediate complications. Estimated Blood Loss:     Estimated blood loss: none. Impression:               1. Gastropathy. Likely NSAID related and/or GAVE                           2. Otherwise normal EGD. Recommendation:           1. Recommend pantoprazole 40 mg daily; #30; 11  refills                           2. Minimize NSAIDs if possible                           3. Obtain an over-the-counter iron supplement and                            take 1 daily                           4. Follow-up biopsies                           5. Return to the care of your primary providers. Docia Chuck. Henrene Pastor, MD 11/19/2018 8:51:18 AM This report has been signed electronically.

## 2018-11-19 NOTE — Progress Notes (Signed)
Pt's states no medical or surgical changes since previsit or office visit.  AM - temp CW - vitals 

## 2018-11-19 NOTE — Op Note (Signed)
[facility] Patient Name: Calvin [name] Procedure Date: 11/19/2018 8:09 AM MRN: [id number] Endoscopist: Docia Chuck. Henrene Pastor , MD Age: 68 Referring MD:  Date of Birth: [birth year] Gender: Male Account #: [id number] Procedure:                Colonoscopy Indications:              Iron deficiency. History of adenomas. Previous                            examinations 1999, 2002, 2007, 2012, 2018 Medicines:                Monitored Anesthesia Care Procedure:                Pre-Anesthesia Assessment:                           - Prior to the procedure, a History and Physical                            was performed, and patient medications and                            allergies were reviewed. The patient's tolerance of                            previous anesthesia was also reviewed. The risks                            and benefits of the procedure and the sedation                            options and risks were discussed with the patient.                            All questions were answered, and informed consent                            was obtained. Prior Anticoagulants: The patient has                            taken no previous anticoagulant or antiplatelet                            agents. ASA Grade Assessment: II - A patient with                            mild systemic disease. After reviewing the risks                            and benefits, the patient was deemed in                            satisfactory condition to undergo the procedure.  After obtaining informed consent, the colonoscope                            was passed under direct vision. Throughout the                            procedure, the patient's blood pressure, pulse, and                            oxygen saturations were monitored continuously. The                            Colonoscope was introduced through the anus and                            advanced to the the cecum,  identified by                            appendiceal orifice and ileocecal valve. The                            ileocecal valve, appendiceal orifice, and rectum                            were photographed. The quality of the bowel                            preparation was excellent. The colonoscopy was                            performed without difficulty. The patient tolerated                            the procedure well. The bowel preparation used was                            suprep via split dose instruction. Scope In: 8:11:10 AM Scope Out: 8:23:52 AM Scope Withdrawal Time: 0 hours 10 minutes 16 seconds  Total Procedure Duration: 0 hours 12 minutes 42 seconds  Findings:                 Multiple small and large-mouthed diverticula were                            found in the entire colon.                           The exam was otherwise without abnormality on                            direct and retroflexion views. Complications:            No immediate complications. Estimated blood loss:  None. Estimated Blood Loss:     Estimated blood loss: none. Impression:               - Diverticulosis in the entire examined colon.                           - The examination was otherwise normal on direct                            and retroflexion views.                           - No specimens collected. Recommendation:           - Repeat colonoscopy is not recommended for                            surveillance.                           - Patient has a contact number available for                            emergencies. The signs and symptoms of potential                            delayed complications were discussed with the                            patient. Return to normal activities tomorrow.                            Written discharge instructions were provided to the                            patient.                           - Resume previous  diet.                           - Continue present medications. Docia Chuck. Henrene Pastor, MD 11/19/2018 8:47:37 AM This report has been signed electronically.

## 2018-11-19 NOTE — Patient Instructions (Signed)
Please read handouts provided. Await pathology results. Obtain over-the-counter iron supplement and take one daily. Minimize NSAIDS if possible. Begin pantoprazole 40 mg daily. Continue present medications.      YOU HAD AN ENDOSCOPIC PROCEDURE TODAY AT [facility]:   Refer to the procedure report that was given to you for any specific questions about what was found during the examination.  If the procedure report does not answer your questions, please call your gastroenterologist to clarify.  If you requested that your care partner not be given the details of your procedure findings, then the procedure report has been included in a sealed envelope for you to review at your convenience later.  YOU SHOULD EXPECT: Some feelings of bloating in the abdomen. Passage of more gas than usual.  Walking can help get rid of the air that was put into your GI tract during the procedure and reduce the bloating. If you had a lower endoscopy (such as a colonoscopy or flexible sigmoidoscopy) you may notice spotting of blood in your stool or on the toilet paper. If you underwent a bowel prep for your procedure, you may not have a normal bowel movement for a few days.  Please Note:  You might notice some irritation and congestion in your nose or some drainage.  This is from the oxygen used during your procedure.  There is no need for concern and it should clear up in a day or so.  SYMPTOMS TO REPORT IMMEDIATELY:   Following lower endoscopy (colonoscopy or flexible sigmoidoscopy):  Excessive amounts of blood in the stool  Significant tenderness or worsening of abdominal pains  Swelling of the abdomen that is new, acute  Fever of 100F or higher   Following upper endoscopy (EGD)  Vomiting of blood or coffee ground material  New chest pain or pain under the shoulder blades  Painful or persistently difficult swallowing  New shortness of breath  Fever of 100F or higher  Black, tarry-looking  stools  For urgent or emergent issues, a gastroenterologist can be reached at any hour by calling [phone].   DIET:  We do recommend a small meal at first, but then you may proceed to your regular diet.  Drink plenty of fluids but you should avoid alcoholic beverages for 24 hours.  ACTIVITY:  You should plan to take it easy for the rest of today and you should NOT DRIVE or use heavy machinery until tomorrow (because of the sedation medicines used during the test).    FOLLOW UP: Our staff will call the number listed on your records 48-72 hours following your procedure to check on you and address any questions or concerns that you may have regarding the information given to you following your procedure. If we do not reach you, we will leave a message.  We will attempt to reach you two times.  During this call, we will ask if you have developed any symptoms of COVID 19. If you develop any symptoms (ie: fever, flu-like symptoms, shortness of breath, cough etc.) before then, please call [phone].  If you test positive for Covid 19 in the 2 weeks post procedure, please call and report this information to [place].    If any biopsies were taken you will be contacted by phone or by letter within the next 1-3 weeks.  Please call us at [phone] if you have not heard about the biopsies in 3 weeks.    SIGNATURES/CONFIDENTIALITY: You and/or your care partner have signed  paperwork which will be entered into your electronic medical record.  These signatures attest to the fact that that the information above on your After Visit Summary has been reviewed and is understood.  Full responsibility of the confidentiality of this discharge information lies with you and/or your care-partner.

## 2018-11-21 ENCOUNTER — Other Ambulatory Visit: Payer: Self-pay

## 2018-11-21 ENCOUNTER — Telehealth: Payer: Self-pay

## 2018-11-21 ENCOUNTER — Telehealth: Payer: Self-pay | Admitting: *Deleted

## 2018-11-21 ENCOUNTER — Encounter: Payer: Self-pay | Admitting: Internal Medicine

## 2018-11-21 DIAGNOSIS — Z20822 Contact with and (suspected) exposure to covid-19: Secondary | ICD-10-CM

## 2018-11-21 NOTE — Telephone Encounter (Signed)
No answer or VM on follow up call.

## 2018-11-21 NOTE — Telephone Encounter (Signed)
  Follow up Call-  Call back number 11/19/2018 04/13/2016  Post procedure Call Back phone  # [phone] -or [phone]  Permission to leave phone message Yes Yes  Some recent data might be hidden     Patient questions:  Do you have a fever, pain , or abdominal swelling? No. Pain Score  0 *  Have you tolerated food without any problems? Yes.    Have you been able to return to your normal activities? Yes.    Do you have any questions about your discharge instructions: Diet   No. Medications  No. Follow up visit  No.  Do you have questions or concerns about your Care? No.  Actions: * If pain score is 4 or above: No action needed, pain <4.  1. Have you developed a fever since your procedure? NO  2.   Have you had an respiratory symptoms (SOB or cough) since your procedure? NO  3.   Have you tested positive for COVID 19 since your procedure NO  4.   Have you had any family members/close contacts diagnosed with the COVID 19 since your procedure?  NO   If yes to any of these questions please route to Joylene John, RN and Alphonsa Gin, RN.

## 2018-11-22 ENCOUNTER — Other Ambulatory Visit: Payer: Self-pay

## 2018-11-22 MED ORDER — OMEPRAZOLE 20 MG PO CPDR: 20.0000 mg | DELAYED_RELEASE_CAPSULE | Freq: Two times a day (BID) | ORAL | 0 refills | Status: DC

## 2018-11-22 MED ORDER — TETRACYCLINE HCL 500 MG PO CAPS: 500.0000 mg | ORAL_CAPSULE | Freq: Four times a day (QID) | ORAL | 0 refills | Status: DC

## 2018-11-22 MED ORDER — METRONIDAZOLE 250 MG PO TABS: 250.0000 mg | ORAL_TABLET | Freq: Four times a day (QID) | ORAL | 0 refills | Status: DC

## 2018-11-22 MED ORDER — BISMUTH SUBSALICYLATE 262 MG PO TABS: 524.0000 mg | ORAL_TABLET | Freq: Four times a day (QID) | ORAL | 0 refills | Status: DC

## 2018-11-23 LAB — NOVEL CORONAVIRUS, NAA: SARS-CoV-2, NAA: NOT DETECTED

## 2018-11-26 ENCOUNTER — Encounter: Payer: Self-pay | Admitting: Internal Medicine

## 2018-11-26 ENCOUNTER — Other Ambulatory Visit: Payer: Self-pay

## 2018-11-26 ENCOUNTER — Other Ambulatory Visit (INDEPENDENT_AMBULATORY_CARE_PROVIDER_SITE_OTHER): Payer: Medicare Other

## 2018-11-26 ENCOUNTER — Ambulatory Visit (INDEPENDENT_AMBULATORY_CARE_PROVIDER_SITE_OTHER): Payer: Medicare Other | Admitting: Internal Medicine

## 2018-11-26 VITALS — BP 128/84 | HR 73 | Temp 98.4°F | Ht 67.5 in | Wt 278.0 lb

## 2018-11-26 DIAGNOSIS — E785 Hyperlipidemia, unspecified: Secondary | ICD-10-CM

## 2018-11-26 DIAGNOSIS — Z23 Encounter for immunization: Secondary | ICD-10-CM

## 2018-11-26 DIAGNOSIS — D500 Iron deficiency anemia secondary to blood loss (chronic): Secondary | ICD-10-CM | POA: Diagnosis not present

## 2018-11-26 DIAGNOSIS — R739 Hyperglycemia, unspecified: Secondary | ICD-10-CM | POA: Diagnosis not present

## 2018-11-26 DIAGNOSIS — E538 Deficiency of other specified B group vitamins: Secondary | ICD-10-CM | POA: Diagnosis not present

## 2018-11-26 DIAGNOSIS — D696 Thrombocytopenia, unspecified: Secondary | ICD-10-CM | POA: Diagnosis not present

## 2018-11-26 DIAGNOSIS — I10 Essential (primary) hypertension: Secondary | ICD-10-CM

## 2018-11-26 DIAGNOSIS — Z0000 Encounter for general adult medical examination without abnormal findings: Secondary | ICD-10-CM

## 2018-11-26 LAB — CBC WITH DIFFERENTIAL/PLATELET
Basophils Absolute: 0 10*3/uL (ref 0.0–0.1)
Basophils Relative: 0.5 % (ref 0.0–3.0)
Eosinophils Absolute: 0.1 10*3/uL (ref 0.0–0.7)
Eosinophils Relative: 1.3 % (ref 0.0–5.0)
HCT: 39.1 % (ref 39.0–52.0)
Hemoglobin: 13.3 g/dL (ref 13.0–17.0)
Lymphocytes Relative: 36.7 % (ref 12.0–46.0)
Lymphs Abs: 1.4 10*3/uL (ref 0.7–4.0)
MCHC: 34.1 g/dL (ref 30.0–36.0)
MCV: 91.8 fl (ref 78.0–100.0)
Monocytes Absolute: 0.3 10*3/uL (ref 0.1–1.0)
Monocytes Relative: 7.1 % (ref 3.0–12.0)
Neutro Abs: 2.1 10*3/uL (ref 1.4–7.7)
Neutrophils Relative %: 54.4 % (ref 43.0–77.0)
Platelets: 115 10*3/uL — ABNORMAL LOW (ref 150.0–400.0)
RBC: 4.26 Mil/uL (ref 4.22–5.81)
RDW: 13.8 % (ref 11.5–15.5)
WBC: 3.9 10*3/uL — ABNORMAL LOW (ref 4.0–10.5)

## 2018-11-26 LAB — HEPATIC FUNCTION PANEL
ALT: 22 U/L (ref 0–53)
AST: 22 U/L (ref 0–37)
Albumin: 4.3 g/dL (ref 3.5–5.2)
Alkaline Phosphatase: 64 U/L (ref 39–117)
Bilirubin, Direct: 0.1 mg/dL (ref 0.0–0.3)
Total Bilirubin: 0.6 mg/dL (ref 0.2–1.2)
Total Protein: 7.2 g/dL (ref 6.0–8.3)

## 2018-11-26 LAB — LIPID PANEL
Cholesterol: 111 mg/dL (ref 0–200)
HDL: 23.2 mg/dL — ABNORMAL LOW (ref >39.00)
NonHDL: 87.65
Total CHOL/HDL Ratio: 5
Triglycerides: 228 mg/dL — ABNORMAL HIGH (ref 0.0–149.0)
VLDL: 45.6 mg/dL — ABNORMAL HIGH (ref 0.0–40.0)

## 2018-11-26 LAB — VITAMIN B12: Vitamin B-12: 402 pg/mL (ref 211–911)

## 2018-11-26 LAB — BASIC METABOLIC PANEL
BUN: 17 mg/dL (ref 6–23)
CO2: 28 mEq/L (ref 19–32)
Calcium: 9.3 mg/dL (ref 8.4–10.5)
Chloride: 106 mEq/L (ref 96–112)
Creatinine, Ser: 1.27 mg/dL (ref 0.40–1.50)
GFR: 67.78 mL/min (ref >60.00)
Glucose, Bld: 93 mg/dL (ref 70–99)
Potassium: 3.4 mEq/L — ABNORMAL LOW (ref 3.5–5.1)
Sodium: 142 mEq/L (ref 135–145)

## 2018-11-26 LAB — LDL CHOLESTEROL, DIRECT: Direct LDL: 61 mg/dL

## 2018-11-26 LAB — HEMOGLOBIN A1C: Hgb A1c MFr Bld: 5.5 % (ref 4.6–6.5)

## 2018-11-26 LAB — FERRITIN: Ferritin: 26.6 ng/mL (ref 22.0–322.0)

## 2018-11-26 LAB — IBC PANEL
Iron: 121 ug/dL (ref 42–165)
Saturation Ratios: 32.6 % (ref 20.0–50.0)
Transferrin: 265 mg/dL (ref 212.0–360.0)

## 2018-11-26 MED ORDER — AMOXICILLIN 500 MG PO CAPS: 500.0000 mg | ORAL_CAPSULE | Freq: Four times a day (QID) | ORAL | 0 refills | Status: DC

## 2018-11-26 MED ORDER — CLARITHROMYCIN 500 MG PO TABS: 500.0000 mg | ORAL_TABLET | Freq: Two times a day (BID) | ORAL | 0 refills | Status: DC

## 2018-11-26 NOTE — Patient Instructions (Addendum)
OK for OTC iron supplement for daily use - slow release  OK to take the NSAIDs but dont do this if not really needed  Ok to take the antibiotic as per Dr Henrene Pastor  Please continue all other medications as before, and refills have been done if requested.  Please have the pharmacy call with any other refills you may need.  Please continue your efforts at being more active, low cholesterol diet, and weight control.  You are otherwise up to date with prevention measures today.  Please keep your appointments with your specialists as you may have planned  Please go to the LAB in the Basement (turn left off the elevator) for the tests to be done today  You will be contacted by phone if any changes need to be made immediately.  Otherwise, you will receive a letter about your results with an explanation, but please check with MyChart first  Please return in Feb 2021, or sooner if needed, with Lab testing done 3-5 days before

## 2018-11-26 NOTE — Progress Notes (Signed)
Subjective:    Patient ID: Calvin [name]., male    DOB: May 07, 1950, 68 y.o.   MRN: [id number]  HPI  Here to f/u; overall doing ok,  Pt denies chest pain, increasing sob or doe, wheezing, orthopnea, PND, increased LE swelling, palpitations, dizziness or syncope.  Pt denies new neurological symptoms such as new headache, or facial or extremity weakness or numbness.  Pt denies polydipsia, polyuria, or low sugar episode.  Pt states overall good compliance with meds, mostly trying to follow appropriate diet, with wt overall stable,  but little exercise however. Denies worsening reflux, abd pain, dysphagia, n/v, bowel change or blood. Now for H pylori tx with positive serology. No overt bleeding.   Past Medical History:  Diagnosis Date  . Allergy   . Arthritis   . Diverticulosis   . Gout   . Hyperlipidemia   . Hypertension   . Iron deficiency anemia 09/20/2018  . OSA (obstructive sleep apnea) 11/13/2013  . Sickle cell trait ([place])   . Sleep apnea    Past Surgical History:  Procedure Laterality Date  . COLONOSCOPY    . POLYPECTOMY    . ROTATOR CUFF REPAIR     left  . THROAT SURGERY     polyps removed from vocal cord    reports that he quit smoking about 41 years ago. His smoking use included cigarettes. He has a 10.00 pack-year smoking history. He has never used smokeless tobacco. He reports that he does not drink alcohol or use drugs. family history is not on file. No Known Allergies Current Outpatient Medications on File Prior to Visit  Medication Sig Dispense Refill  . allopurinol (ZYLOPRIM) 300 MG tablet Take 1 tablet (300 mg total) by mouth daily. 90 tablet 3  . aspirin 81 MG tablet Take 81 mg by mouth daily.      . Bismuth Subsalicylate 99991111 MG TABS Take 2 tablets (524 mg total) by mouth 4 (four) times daily. 80 tablet 0  . colchicine 0.6 MG tablet Take 1 tablet by mouth once daily 30 tablet 5  . finasteride (PROSCAR) 5 MG tablet Take 1 tablet (5 mg total) by mouth daily. 90  tablet 3  . fluticasone (FLONASE) 50 MCG/ACT nasal spray Place 2 sprays into the nose daily. (Patient taking differently: Place 2 sprays into the nose daily. ) 48 g 3  . indomethacin (INDOCIN) 50 MG capsule TAKE 1 CAPSULE BY MOUTH THREE TIMES DAILY AS NEEDED 270 capsule 0  . irbesartan (AVAPRO) 300 MG tablet Take 1 tablet (300 mg total) by mouth daily. 90 tablet 2  . loratadine (CLARITIN) 10 MG tablet Take 10 mg by mouth daily as needed for allergies.    . Multiple Vitamins-Minerals (CENTRUM MULTI + OMEGA 3 PO) Take by mouth.    . nabumetone (RELAFEN) 500 MG tablet TAKE 1 TABLET(500 MG) BY MOUTH DAILY 90 tablet 1  . omeprazole (PRILOSEC) 20 MG capsule Take 1 capsule (20 mg total) by mouth 2 (two) times daily before a meal. 20 capsule 0  . pantoprazole (PROTONIX) 40 MG tablet Take 1 tablet (40 mg total) by mouth daily. 30 tablet 11  . pravastatin (PRAVACHOL) 40 MG tablet Take 1 tablet (40 mg total) by mouth daily. 90 tablet 2  . sildenafil (VIAGRA) 100 MG tablet Take 1 tablet (100 mg total) by mouth as needed for erectile dysfunction. Yearly physical w/labs due in February must see MD for refills 10 tablet 0  . tamsulosin (FLOMAX) 0.4 MG CAPS  capsule TAKE 1 CAPSULE BY MOUTH  DAILY 90 capsule 1  . verapamil (CALAN-SR) 240 MG CR tablet Take 1 tablet (240 mg total) by mouth at bedtime. 90 tablet 2   No current facility-administered medications on file prior to visit.    Review of Systems  Constitutional: Negative for other unusual diaphoresis or sweats HENT: Negative for ear discharge or swelling Eyes: Negative for other worsening visual disturbances Respiratory: Negative for stridor or other swelling  Gastrointestinal: Negative for worsening distension or other blood Genitourinary: Negative for retention or other urinary change Musculoskeletal: Negative for other MSK pain or swelling Skin: Negative for color change or other new lesions Neurological: Negative for worsening tremors and other  numbness  Psychiatric/Behavioral: Negative for worsening agitation or other fatigue All otherwise neg per pt    Objective:   Physical Exam BP 128/84   Pulse 73   Temp 98.4 F (36.9 C) (Oral)   Ht 5' 7.5" (1.715 m)   Wt 278 lb (126.1 kg)   SpO2 96%   BMI 42.90 kg/m  VS noted,  Constitutional: Pt appears in NAD HENT: Head: NCAT.  Right Ear: External ear normal.  Left Ear: External ear normal.  Eyes: . Pupils are equal, round, and reactive to light. Conjunctivae and EOM are normal Nose: without d/c or deformity Neck: Neck supple. Gross normal ROM Cardiovascular: Normal rate and regular rhythm.   Pulmonary/Chest: Effort normal and breath sounds without rales or wheezing.  Abd:  Soft, NT, ND, + BS, no organomegaly Neurological: Pt is alert. At baseline orientation, motor grossly intact Skin: Skin is warm. No rashes, other new lesions, no LE edema Psychiatric: Pt behavior is normal without agitation  All otherwise neg per pt Lab Results  Component Value Date   WBC 3.9 (L) 11/26/2018   HGB 13.3 11/26/2018   HCT 39.1 11/26/2018   PLT 115.0 (L) 11/26/2018   GLUCOSE 93 11/26/2018   CHOL 111 11/26/2018   TRIG 228.0 (H) 11/26/2018   HDL 23.20 (L) 11/26/2018   LDLDIRECT 61.0 11/26/2018   LDLCALC 64 05/09/2018   ALT 22 11/26/2018   AST 22 11/26/2018   NA 142 11/26/2018   K 3.4 (L) 11/26/2018   CL 106 11/26/2018   CREATININE 1.27 11/26/2018   BUN 17 11/26/2018   CO2 28 11/26/2018   TSH 1.14 05/09/2018   PSA 2.17 05/09/2018   INR 1.0 05/29/2018   HGBA1C 5.5 11/26/2018          Assessment & Plan:

## 2018-12-01 ENCOUNTER — Encounter: Payer: Self-pay | Admitting: Internal Medicine

## 2018-12-01 NOTE — Assessment & Plan Note (Signed)
For f/u with labs 

## 2018-12-01 NOTE — Assessment & Plan Note (Signed)
stable overall by history and exam, recent data reviewed with pt, and pt to continue medical treatment as before,  to f/u any worsening symptoms or concerns  

## 2018-12-01 NOTE — Assessment & Plan Note (Signed)
Etiology unclear, to cont oral iron, f/u lab today and net visit

## 2018-12-05 ENCOUNTER — Other Ambulatory Visit: Payer: Self-pay | Admitting: Internal Medicine

## 2018-12-19 ENCOUNTER — Encounter: Payer: Self-pay | Admitting: Family Medicine

## 2019-01-16 ENCOUNTER — Telehealth: Payer: Self-pay

## 2019-01-16 NOTE — Telephone Encounter (Signed)
Copied from Max [phone]. Topic: Appointment Scheduling - Scheduling Inquiry for Clinic >> Jan 16, 2019  9:26 AM Scherrie Gerlach wrote: Reason for CRM: pt would like to switch to [facility] from Dr Jenny Reichmann.  Pt has been with Dr Jenny Reichmann over 20 yrs, but [facility] much closer to [place] home.  Pt retired now and would like someone closer to his home. Please advise if ok

## 2019-01-16 NOTE — Telephone Encounter (Signed)
[place] and Dr. Jenny Reichmann please advise,

## 2019-01-16 NOTE — Telephone Encounter (Signed)
Ok with me 

## 2019-01-17 NOTE — Telephone Encounter (Signed)
Please help the pt schedule TOC

## 2019-01-17 NOTE — Telephone Encounter (Signed)
I called and left message on patient voicemail to call office and schedule appointment with [facility]. TOC has been approved to see her.

## 2019-01-17 NOTE — Telephone Encounter (Signed)
ok 

## 2019-01-21 ENCOUNTER — Telehealth: Payer: Self-pay | Admitting: Nurse Practitioner

## 2019-01-21 NOTE — Telephone Encounter (Signed)

## 2019-01-22 ENCOUNTER — Other Ambulatory Visit: Payer: Self-pay

## 2019-01-22 ENCOUNTER — Encounter: Payer: Self-pay | Admitting: Nurse Practitioner

## 2019-01-22 ENCOUNTER — Ambulatory Visit (INDEPENDENT_AMBULATORY_CARE_PROVIDER_SITE_OTHER): Payer: Medicare Other | Admitting: Nurse Practitioner

## 2019-01-22 VITALS — BP 132/84 | HR 64 | Temp 96.8°F | Ht 67.5 in | Wt 281.2 lb

## 2019-01-22 DIAGNOSIS — D696 Thrombocytopenia, unspecified: Secondary | ICD-10-CM | POA: Diagnosis not present

## 2019-01-22 DIAGNOSIS — E781 Pure hyperglyceridemia: Secondary | ICD-10-CM | POA: Diagnosis not present

## 2019-01-22 DIAGNOSIS — D500 Iron deficiency anemia secondary to blood loss (chronic): Secondary | ICD-10-CM | POA: Diagnosis not present

## 2019-01-22 DIAGNOSIS — Z23 Encounter for immunization: Secondary | ICD-10-CM | POA: Diagnosis not present

## 2019-01-22 DIAGNOSIS — I10 Essential (primary) hypertension: Secondary | ICD-10-CM | POA: Diagnosis not present

## 2019-01-22 DIAGNOSIS — D472 Monoclonal gammopathy: Secondary | ICD-10-CM

## 2019-01-22 DIAGNOSIS — R739 Hyperglycemia, unspecified: Secondary | ICD-10-CM

## 2019-01-22 NOTE — Progress Notes (Signed)
Subjective:  Patient ID: Calvin Broad., male    DOB: 12-22-1950  Age: 68 y.o. MRN: [id number]  CC: Establish Care (TOC from Dr. 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  Vertical ridges on fingernails x 35yrs.  Iron deficiency of unknown cause, Sickle cell trait and Monoclonal gammopathy: Diagnosed 05/2018, evaluated by hematology: Dr. Walden Field 09/2018 Normal colonoscopy 11/19/2018. Endoscopthy done 09/08/2020Lgastropathy due to use of NSAIDs, otherwise normal Normal cbc, IBC and ferritin 11/26/2018. He is to schedule f/up with Dr. Walden Field Denies any melena or hematochezia or abnormal bruising or chest pain or palpitation or SOB.  BPH:  followed by [facility]: Dr. Preston Fleeting Stable urinary symptoms: nocturia, no hematuria Recent PSA of 3.2 per patient viagra 2-3x/year.  OSA: Dr. [name] (pulmonology) Use of CPAP night at least 7hrs.  Reviewed past Medical, Social and Family history today.  Outpatient Medications Prior to Visit  Medication Sig Dispense Refill  . allopurinol (ZYLOPRIM) 300 MG tablet Take 1 tablet (300 mg total) by mouth daily. 90 tablet 3  . aspirin 81 MG tablet Take 81 mg by mouth daily.      . colchicine 0.6 MG tablet Take 0.5 tablets (0.3 mg total) by mouth daily. 45 tablet 1  . Ferrous Sulfate (IRON PO) Take 65 mg by mouth. Vitron Iron+ Vit C.    . finasteride (PROSCAR) 5 MG tablet Take 1 tablet (5 mg total) by mouth daily. 90 tablet 3  . irbesartan (AVAPRO) 300 MG tablet TAKE 1 TABLET BY MOUTH  DAILY 90 tablet 3  . loratadine (CLARITIN) 10 MG tablet Take 10 mg by mouth daily as needed for allergies.    . nabumetone (RELAFEN) 500 MG tablet TAKE 1 TABLET(500 MG) BY MOUTH DAILY 90 tablet 1  . pantoprazole (PROTONIX) 40 MG tablet Take 1 tablet (40 mg total) by mouth daily. 30 tablet 11  . sildenafil (VIAGRA) 100 MG tablet Take 1 tablet (100 mg total) by mouth as needed for erectile dysfunction.  Yearly physical w/labs due in February must see MD for refills 10 tablet 0  . tamsulosin (FLOMAX) 0.4 MG CAPS capsule TAKE 1 CAPSULE BY MOUTH  DAILY 90 capsule 3  . verapamil (CALAN-SR) 240 MG CR tablet Take 1 tablet (240 mg total) by mouth at bedtime. 90 tablet 2  . colchicine 0.6 MG tablet Take 1 tablet by mouth once daily 30 tablet 5  . fluticasone (FLONASE) 50 MCG/ACT nasal spray Place 2 sprays into the nose daily. (Patient taking differently: Place 2 sprays into the nose daily. ) 48 g 3  . indomethacin (INDOCIN) 50 MG capsule TAKE 1 CAPSULE BY MOUTH THREE TIMES DAILY AS NEEDED 270 capsule 0  . pravastatin (PRAVACHOL) 40 MG tablet Take 1 tablet (40 mg total) by mouth daily. 90 tablet 2  . Multiple Vitamins-Minerals (CENTRUM MULTI + OMEGA 3 PO) Take by mouth.    Marland Kitchen amoxicillin (AMOXIL) 500 MG capsule Take 1 capsule (500 mg total) by mouth 4 (four) times daily. (Patient not taking: Reported on 01/22/2019) 56 capsule 0  . Bismuth Subsalicylate 99991111 MG TABS Take 2 tablets (524 mg total) by mouth 4 (four) times daily. (Patient not taking: Reported on 01/22/2019) 80 tablet 0  . clarithromycin (BIAXIN) 500 MG tablet Take 1 tablet (500 mg total) by mouth 2 (two) times daily. (Patient not taking: Reported on 01/22/2019) 28 tablet 0  . omeprazole (PRILOSEC) 20 MG capsule Take 1 capsule (20 mg total)  by mouth 2 (two) times daily before a meal. (Patient not taking: Reported on 01/22/2019) 20 capsule 0   No facility-administered medications prior to visit.     ROS See HPI  Objective:  BP 132/84   Pulse 64   Temp (!) 96.8 F (36 C) (Tympanic)   Ht 5' 7.5" (1.715 m)   Wt 281 lb 3.2 oz (127.6 kg)   SpO2 97%   BMI 43.39 kg/m   BP Readings from Last 3 Encounters:  01/22/19 132/84  11/26/18 128/84  11/19/18 (!) 141/96    Wt Readings from Last 3 Encounters:  01/22/19 281 lb 3.2 oz (127.6 kg)  11/26/18 278 lb (126.1 kg)  11/19/18 280 lb (127 kg)    Physical Exam Constitutional:       Appearance: He is obese.  HENT:     Head: Normocephalic.  Cardiovascular:     Rate and Rhythm: Normal rate and regular rhythm.     Pulses: Normal pulses.     Heart sounds: Murmur present.  Pulmonary:     Effort: Pulmonary effort is normal.     Breath sounds: Normal breath sounds.  Musculoskeletal:     Right lower leg: Edema present.     Left lower leg: Edema present.  Skin:    Findings: No erythema or rash.  Neurological:     Mental Status: He is alert and oriented to person, place, and time.     Lab Results  Component Value Date   WBC 3.3 (L) 01/22/2019   HGB 12.6 (L) 01/22/2019   HCT 37.2 (L) 01/22/2019   PLT CANCELED 01/22/2019   GLUCOSE 93 11/26/2018   CHOL 122 01/22/2019   TRIG 283 (H) 01/22/2019   HDL 21 (L) 01/22/2019   LDLDIRECT 61.0 11/26/2018   LDLCALC 64 01/22/2019   ALT 22 11/26/2018   AST 22 11/26/2018   NA 142 11/26/2018   K 3.4 (L) 11/26/2018   CL 106 11/26/2018   CREATININE 1.27 11/26/2018   BUN 17 11/26/2018   CO2 28 11/26/2018   TSH 0.75 01/22/2019   PSA 2.17 05/09/2018   INR 1.0 05/29/2018   HGBA1C 5.5 11/26/2018   MICROALBUR 5.9 01/22/2019    Dg Chest 2 View  Result Date: 07/03/2018 CLINICAL DATA:  68 year old male with a history of chills and low-grade fever EXAM: CHEST - 2 VIEW COMPARISON:  03/03/2008, CT abdomen 06/05/2018 FINDINGS: The heart size and mediastinal contours are within normal limits. Both lungs are clear. Degenerative spine changes of the spine with no evidence of acute displaced fracture IMPRESSION: Negative for acute cardiopulmonary disease Electronically Signed   By: Corrie Mckusick D.O.   On: 07/03/2018 14:19    Assessment & Plan:   Calvin [name] was seen today for establish care.  Diagnoses and all orders for this visit:  Hypertriglyceridemia -     Lipid panel -     fenofibrate (TRICOR) 145 MG tablet; Take 1 tablet (145 mg total) by mouth daily.  Essential hypertension -     TSH  Iron deficiency anemia due to chronic  blood loss -     Iron, TIBC and Ferritin Panel  Thrombocytopenia (HCC) -     CBC w/Diff -     Iron, TIBC and Ferritin Panel  Hyperglycemia -     Microalbumin / creatinine urine ratio  Monoclonal gammopathies -     Cancel: Protein Electrophoresis, (serum)  Need for shingles vaccine -     Varicella-zoster vaccine IM (Shingrix)   I have  discontinued Calvin Medico Jr.'s fluticasone, indomethacin, pravastatin, Bismuth Subsalicylate, omeprazole, clarithromycin, and amoxicillin. I have also changed his colchicine. Additionally, I am having him start on fenofibrate. Lastly, I am having him maintain his aspirin, sildenafil, loratadine, nabumetone, allopurinol, verapamil, finasteride, Multiple Vitamins-Minerals (CENTRUM MULTI + OMEGA 3 PO), pantoprazole, tamsulosin, irbesartan, and Ferrous Sulfate (IRON PO).  Meds ordered this encounter  Medications  . fenofibrate (TRICOR) 145 MG tablet    Sig: Take 1 tablet (145 mg total) by mouth daily.    Dispense:  90 tablet    Refill:  1    Discontinue pravastatin    Order Specific Question:   Supervising Provider    Answer:   Lucille Passy [[id number]]    Problem List Items Addressed This Visit      Cardiovascular and Mediastinum   Essential hypertension   Relevant Medications   fenofibrate (TRICOR) 145 MG tablet   Other Relevant Orders   TSH (Completed)     Other   Hyperglycemia   Relevant Orders   Microalbumin / creatinine urine ratio (Completed)   Hypertriglyceridemia - Primary    D/c pravastatin. Start fenofibrate. F/up in 3months for repeat fasting lipid panel.  Lipid Panel     Component Value Date/Time   CHOL 122 01/22/2019 1421   TRIG 283 (H) 01/22/2019 1421   TRIG 41 02/12/2006 0738   HDL 21 (L) 01/22/2019 1421   CHOLHDL 5.8 (H) 01/22/2019 1421   VLDL 45.6 (H) 11/26/2018 1036   LDLCALC 64 01/22/2019 1421   LDLDIRECT 61.0 11/26/2018 1036        Relevant Medications   fenofibrate (TRICOR) 145 MG tablet   Other Relevant  Orders   Lipid panel (Completed)   Iron deficiency anemia   Relevant Medications   Ferrous Sulfate (IRON PO)   Other Relevant Orders   Iron, TIBC and Ferritin Panel (Completed)   Monoclonal gammopathies   Thrombocytopenia (HCC)   Relevant Orders   CBC w/Diff (Completed)   Iron, TIBC and Ferritin Panel (Completed)    Other Visit Diagnoses    Need for shingles vaccine       Relevant Orders   Varicella-zoster vaccine IM (Shingrix) (Completed)       Follow-up: Return in about 3 months (around 04/24/2019) for DM and HTN, hyperlipidemia (fasting).  Wilfred Lacy, NP

## 2019-01-22 NOTE — Patient Instructions (Addendum)
Normal iron panel, and TSH  Cbc indicates persistent anemia. Unable to measure platelet count due to clumping. Please contact [facility] pffice for f/up appt ASAP.  Abnormal lipid panel: elevated triglyceride and low HDL. Need to discontinue pravastatin. Start fenofibrate. New rx sent.  F/up with me in 37months (fasting)  You will need to schedule f/up appt with Dr. Walden Field but wait till we contact you with today's lab results.  Return to office in 2-95months for second shingrix vaccine.

## 2019-01-23 ENCOUNTER — Telehealth: Payer: Self-pay | Admitting: Hematology and Oncology

## 2019-01-23 ENCOUNTER — Encounter: Payer: Self-pay | Admitting: Nurse Practitioner

## 2019-01-23 ENCOUNTER — Telehealth: Payer: Self-pay | Admitting: Emergency Medicine

## 2019-01-23 DIAGNOSIS — D472 Monoclonal gammopathy: Secondary | ICD-10-CM | POA: Insufficient documentation

## 2019-01-23 LAB — IRON,TIBC AND FERRITIN PANEL
%SAT: 36 % (calc) (ref 20–48)
Ferritin: 31 ng/mL (ref 24–380)
Iron: 112 ug/dL (ref 50–180)
TIBC: 313 mcg/dL (calc) (ref 250–425)

## 2019-01-23 LAB — CBC WITH DIFFERENTIAL/PLATELET
Absolute Monocytes: 228 cells/uL (ref 200–950)
Basophils Absolute: 10 cells/uL (ref 0–200)
Basophils Relative: 0.3 %
Eosinophils Absolute: 40 cells/uL (ref 15–500)
Eosinophils Relative: 1.2 %
HCT: 37.2 % — ABNORMAL LOW (ref 38.5–50.0)
Hemoglobin: 12.6 g/dL — ABNORMAL LOW (ref 13.2–17.1)
Lymphs Abs: 1343 cells/uL (ref 850–3900)
MCH: 31 pg (ref 27.0–33.0)
MCHC: 33.9 g/dL (ref 32.0–36.0)
MCV: 91.6 fL (ref 80.0–100.0)
MPV: 13.5 fL — ABNORMAL HIGH (ref 7.5–12.5)
Monocytes Relative: 6.9 %
Neutro Abs: 1680 cells/uL (ref 1500–7800)
Neutrophils Relative %: 50.9 %
RBC: 4.06 10*6/uL — ABNORMAL LOW (ref 4.20–5.80)
RDW: 13.8 % (ref 11.0–15.0)
Total Lymphocyte: 40.7 %
WBC: 3.3 10*3/uL — ABNORMAL LOW (ref 3.8–10.8)

## 2019-01-23 LAB — LIPID PANEL
Cholesterol: 122 mg/dL (ref <200)
HDL: 21 mg/dL — ABNORMAL LOW (ref >=40)
LDL Cholesterol (Calc): 64 mg/dL (calc)
Non-HDL Cholesterol (Calc): 101 mg/dL (calc) (ref <130)
Total CHOL/HDL Ratio: 5.8 (calc) — ABNORMAL HIGH (ref <5.0)
Triglycerides: 283 mg/dL — ABNORMAL HIGH (ref <150)

## 2019-01-23 LAB — TSH: TSH: 0.75 mIU/L (ref 0.40–4.50)

## 2019-01-23 LAB — MICROALBUMIN / CREATININE URINE RATIO
Creatinine, Urine: 108 mg/dL (ref 20–320)
Microalb Creat Ratio: 55 mcg/mg creat — ABNORMAL HIGH (ref <30)
Microalb, Ur: 5.9 mg/dL

## 2019-01-23 MED ORDER — FENOFIBRATE 145 MG PO TABS: 145.0000 mg | ORAL_TABLET | Freq: Every day | ORAL | 1 refills | Status: DC

## 2019-01-23 NOTE — Assessment & Plan Note (Signed)
D/c pravastatin. Start fenofibrate. F/up in 23months for repeat fasting lipid panel.  Lipid Panel     Component Value Date/Time   CHOL 122 01/22/2019 1421   TRIG 283 (H) 01/22/2019 1421   TRIG 41 02/12/2006 0738   HDL 21 (L) 01/22/2019 1421   CHOLHDL 5.8 (H) 01/22/2019 1421   VLDL 45.6 (H) 11/26/2018 1036   LDLCALC 64 01/22/2019 1421   LDLDIRECT 61.0 11/26/2018 1036

## 2019-01-23 NOTE — Telephone Encounter (Signed)
Returned call to former [facility] patient. Left message re returning call to me directly to set up appointment with Dr. Lorenso Courier.

## 2019-01-23 NOTE — Telephone Encounter (Signed)
Pt left VM on triage phone requesting a f/u appt with [place] [facility] who has left [facility].  Information passed along to [place] who states that scheduling dept will f/u with pt.

## 2019-01-24 ENCOUNTER — Encounter: Payer: Self-pay | Admitting: Nurse Practitioner

## 2019-01-24 NOTE — Telephone Encounter (Signed)
Confirmed 11/16 lab and follow up with Dr. Lorenso Courier with patient.

## 2019-01-27 ENCOUNTER — Inpatient Hospital Stay: Payer: Medicare Other

## 2019-01-27 ENCOUNTER — Inpatient Hospital Stay: Payer: Medicare Other | Attending: Hematology and Oncology | Admitting: Hematology and Oncology

## 2019-01-27 ENCOUNTER — Other Ambulatory Visit: Payer: Self-pay

## 2019-01-27 VITALS — BP 165/82 | HR 63 | Temp 98.0°F | Resp 17 | Ht 67.5 in | Wt 280.3 lb

## 2019-01-27 DIAGNOSIS — D472 Monoclonal gammopathy: Secondary | ICD-10-CM | POA: Diagnosis not present

## 2019-01-27 DIAGNOSIS — E785 Hyperlipidemia, unspecified: Secondary | ICD-10-CM | POA: Diagnosis not present

## 2019-01-27 DIAGNOSIS — Z791 Long term (current) use of non-steroidal anti-inflammatories (NSAID): Secondary | ICD-10-CM | POA: Insufficient documentation

## 2019-01-27 DIAGNOSIS — Z7982 Long term (current) use of aspirin: Secondary | ICD-10-CM | POA: Insufficient documentation

## 2019-01-27 DIAGNOSIS — D72819 Decreased white blood cell count, unspecified: Secondary | ICD-10-CM

## 2019-01-27 DIAGNOSIS — I10 Essential (primary) hypertension: Secondary | ICD-10-CM | POA: Insufficient documentation

## 2019-01-27 DIAGNOSIS — Z79899 Other long term (current) drug therapy: Secondary | ICD-10-CM | POA: Insufficient documentation

## 2019-01-27 DIAGNOSIS — Z87891 Personal history of nicotine dependence: Secondary | ICD-10-CM | POA: Diagnosis not present

## 2019-01-27 DIAGNOSIS — D696 Thrombocytopenia, unspecified: Secondary | ICD-10-CM

## 2019-01-27 DIAGNOSIS — M1990 Unspecified osteoarthritis, unspecified site: Secondary | ICD-10-CM | POA: Diagnosis not present

## 2019-01-27 DIAGNOSIS — D649 Anemia, unspecified: Secondary | ICD-10-CM | POA: Diagnosis not present

## 2019-01-27 DIAGNOSIS — D573 Sickle-cell trait: Secondary | ICD-10-CM | POA: Diagnosis not present

## 2019-01-27 DIAGNOSIS — G4733 Obstructive sleep apnea (adult) (pediatric): Secondary | ICD-10-CM | POA: Insufficient documentation

## 2019-01-27 DIAGNOSIS — M109 Gout, unspecified: Secondary | ICD-10-CM | POA: Diagnosis not present

## 2019-01-27 LAB — PLATELET BY CITRATE

## 2019-01-27 LAB — CMP (CANCER CENTER ONLY)
ALT: 19 U/L (ref 0–44)
AST: 17 U/L (ref 15–41)
Albumin: 4.2 g/dL (ref 3.5–5.0)
Alkaline Phosphatase: 73 U/L (ref 38–126)
Anion gap: 9 (ref 5–15)
BUN: 14 mg/dL (ref 8–23)
CO2: 31 mmol/L (ref 22–32)
Calcium: 9 mg/dL (ref 8.9–10.3)
Chloride: 105 mmol/L (ref 98–111)
Creatinine: 1.27 mg/dL — ABNORMAL HIGH (ref 0.61–1.24)
GFR, Est AFR Am: >60 mL/min (ref >60)
GFR, Estimated: 57 mL/min — ABNORMAL LOW (ref >60)
Glucose, Bld: 86 mg/dL (ref 70–99)
Potassium: 3.3 mmol/L — ABNORMAL LOW (ref 3.5–5.1)
Sodium: 145 mmol/L (ref 135–145)
Total Bilirubin: 0.6 mg/dL (ref 0.3–1.2)
Total Protein: 7.4 g/dL (ref 6.5–8.1)

## 2019-01-27 LAB — CBC WITH DIFFERENTIAL (CANCER CENTER ONLY)
Abs Immature Granulocytes: 0 10*3/uL (ref 0.00–0.07)
Basophils Absolute: 0 10*3/uL (ref 0.0–0.1)
Basophils Relative: 0 %
Eosinophils Absolute: 0 10*3/uL (ref 0.0–0.5)
Eosinophils Relative: 1 %
HCT: 37 % — ABNORMAL LOW (ref 39.0–52.0)
Hemoglobin: 12.6 g/dL — ABNORMAL LOW (ref 13.0–17.0)
Immature Granulocytes: 0 %
Lymphocytes Relative: 47 %
Lymphs Abs: 1.2 10*3/uL (ref 0.7–4.0)
MCH: 31.1 pg (ref 26.0–34.0)
MCHC: 34.1 g/dL (ref 30.0–36.0)
MCV: 91.4 fL (ref 80.0–100.0)
Monocytes Absolute: 0.2 10*3/uL (ref 0.1–1.0)
Monocytes Relative: 9 %
Neutro Abs: 1.1 10*3/uL — ABNORMAL LOW (ref 1.7–7.7)
Neutrophils Relative %: 43 %
Platelet Count: 121 10*3/uL — ABNORMAL LOW (ref 150–400)
RBC: 4.05 MIL/uL — ABNORMAL LOW (ref 4.22–5.81)
RDW: 13.1 % (ref 11.5–15.5)
WBC Count: 2.6 10*3/uL — ABNORMAL LOW (ref 4.0–10.5)
nRBC: 0 % (ref 0.0–0.2)

## 2019-01-27 LAB — SAVE SMEAR(SSMR), FOR PROVIDER SLIDE REVIEW

## 2019-01-27 LAB — LACTATE DEHYDROGENASE: LDH: 166 U/L (ref 98–192)

## 2019-01-27 LAB — TSH: TSH: 1.338 u[IU]/mL (ref 0.320–4.118)

## 2019-01-28 ENCOUNTER — Telehealth: Payer: Self-pay | Admitting: Hematology and Oncology

## 2019-01-28 LAB — KAPPA/LAMBDA LIGHT CHAINS
Kappa free light chain: 42.4 mg/L — ABNORMAL HIGH (ref 3.3–19.4)
Kappa, lambda light chain ratio: 2.09 — ABNORMAL HIGH (ref 0.26–1.65)
Lambda free light chains: 20.3 mg/L (ref 5.7–26.3)

## 2019-01-28 LAB — BETA 2 MICROGLOBULIN, SERUM: Beta-2 Microglobulin: 2.1 mg/L (ref 0.6–2.4)

## 2019-01-28 NOTE — Telephone Encounter (Signed)
No los per 11/16. °

## 2019-01-28 NOTE — Progress Notes (Signed)
[place] [phone]   [phone]  PROGRESS NOTE  Patient Care Team: Nche, Charlene Brooke, NP as PCP - General (Internal Medicine) Franchot Gallo, MD as Consulting Physician (Urology) Netta Cedars, MD as Consulting Physician (Orthopedic Surgery)  Hematological/Oncological History  # Normocytic Anemia 1) 05/29/2018: HB electrophoresis confirms sickle cell trait. Ferritin 18. Hgb 13.1. MMA 107 2) 01/22/2019: iron 112, TIBC 313, Sat 36%, ferritin 31. Hgb 12.6 3) 01/27/2019: establish care with Dr. Lorenso Courier   #MGUS 1) 09/18/18:  M protein 0.2, kappa 38, lambda 17.6, ratio 2.2 2) 01/27/2019: establish care with Dr. Lorenso Courier   #Leukopenia 1) Chronically low, with WBC 3.5 dating back to 03/24/2009 2) 2015-2020: stable at baseline WBC 3.5-5.0 3) 01/27/2019: establish care with Dr. Lorenso Courier   Interval History:  Calvin [name]. 68 y.o. male with medical history significant for gout, HTN, and HLD presents for a follow up visit. He was last seen by Dr. Walden Field on 09/20/2018.   On exam today the patient notes that he feels well.  He reports that he has a well-balanced diet he eats chicken [place] and fish.  He notes that he rarely eats steak and only eats it approximately 3-4 times per year.  He does not note that he did well with oral iron therapy and had no difficulty taking the medication.  Of note he reports that he was a vigorous blood donor donating as frequently as possible and often giving a double donation.  He did this as frequently as possible which was up to every 60 days.  He notes that his last donation was given in the year 2018.   He currently denies any fevers, chills, or sweats.  He notes his energy levels are at baseline.  He reports no new bone or back pain.  He denies any bleeding bruising or dark stools.  Overall he is at his baseline level of health and has no other complaints or concerns today.  A full 10 point ROS is listed below.   MEDICAL HISTORY:  Past Medical History:  Diagnosis Date  . Allergy   . Arthritis   . Diverticulosis   . Gout   . Hyperlipidemia   . Hypertension   . Iron deficiency anemia 09/20/2018  . OSA (obstructive sleep apnea) 11/13/2013  . Sickle cell trait ([place])   . Sleep apnea     SURGICAL HISTORY: Past Surgical History:  Procedure Laterality Date  . COLONOSCOPY    . POLYPECTOMY    . ROTATOR CUFF REPAIR     left  . THROAT SURGERY     polyps removed from vocal cord    SOCIAL HISTORY: Social History   Socioeconomic History  . Marital status: Married    Spouse name: Not on file  . Number of children: 1  . Years of education: Not on file  . Highest education level: Not on file  Occupational History  . Occupation: retired  Scientific laboratory technician  . Financial resource strain: Not hard at all  . Food insecurity    Worry: Never true    Inability: Never true  . Transportation needs    Medical: No    Non-medical: No  Tobacco Use  . Smoking status: Former Smoker    Packs/day: 2.00    Years: 5.00    Pack years: 10.00    Types: Cigarettes    Quit date: 05/23/1977    Years since quitting: 41.7  . Smokeless tobacco: Never Used  Substance and Sexual Activity  .  Alcohol use: No    Comment: QUIT 2002  . Drug use: No    Comment: Completed program in april 2000  . Sexual activity: Yes  Lifestyle  . Physical activity    Days per week: 3 days    Minutes per session: 40 min  . Stress: Not at all  Relationships  . Social connections    Talks on phone: More than three times a week    Gets together: More than three times a week    Attends religious service: More than 4 times per year    Active member of club or organization: Yes    Attends meetings of clubs or organizations: More than 4 times per year    Relationship status: Married  . Intimate partner violence    Fear of current or ex partner: No    Emotionally abused: No    Physically abused: No    Forced sexual activity: No  Other  Topics Concern  . Not on file  Social History Narrative  . Not on file    FAMILY HISTORY: Family History  Problem Relation Age of Onset  . Colon cancer Neg Hx   . Esophageal cancer Neg Hx   . Rectal cancer Neg Hx   . Stomach cancer Neg Hx     ALLERGIES:  has No Known Allergies.  MEDICATIONS:  Current Outpatient Medications  Medication Sig Dispense Refill  . allopurinol (ZYLOPRIM) 300 MG tablet Take 1 tablet (300 mg total) by mouth daily. 90 tablet 3  . aspirin 81 MG tablet Take 81 mg by mouth daily.      . colchicine 0.6 MG tablet Take 0.5 tablets (0.3 mg total) by mouth daily. 45 tablet 1  . fenofibrate (TRICOR) 145 MG tablet Take 1 tablet (145 mg total) by mouth daily. 90 tablet 1  . Ferrous Sulfate (IRON PO) Take 65 mg by mouth. Vitron Iron+ Vit C.    . finasteride (PROSCAR) 5 MG tablet Take 1 tablet (5 mg total) by mouth daily. 90 tablet 3  . irbesartan (AVAPRO) 300 MG tablet TAKE 1 TABLET BY MOUTH  DAILY 90 tablet 3  . loratadine (CLARITIN) 10 MG tablet Take 10 mg by mouth daily as needed for allergies.    . Multiple Vitamins-Minerals (CENTRUM MULTI + OMEGA 3 PO) Take by mouth.    . nabumetone (RELAFEN) 500 MG tablet TAKE 1 TABLET(500 MG) BY MOUTH DAILY 90 tablet 1  . pantoprazole (PROTONIX) 40 MG tablet Take 1 tablet (40 mg total) by mouth daily. 30 tablet 11  . sildenafil (VIAGRA) 100 MG tablet Take 1 tablet (100 mg total) by mouth as needed for erectile dysfunction. Yearly physical w/labs due in February must see MD for refills 10 tablet 0  . tamsulosin (FLOMAX) 0.4 MG CAPS capsule TAKE 1 CAPSULE BY MOUTH  DAILY 90 capsule 3  . verapamil (CALAN-SR) 240 MG CR tablet Take 1 tablet (240 mg total) by mouth at bedtime. 90 tablet 2   No current facility-administered medications for this visit.     REVIEW OF SYSTEMS:   Constitutional: ( - ) fevers, ( - )  chills , ( - ) night sweats Eyes: ( - ) blurriness of vision, ( - ) double vision, ( - ) watery eyes Ears, nose, mouth,  throat, and face: ( - ) mucositis, ( - ) sore throat Respiratory: ( - ) cough, ( - ) dyspnea, ( - ) wheezes Cardiovascular: ( - ) palpitation, ( - ) chest discomfort, ( - )  lower extremity swelling Gastrointestinal:  ( - ) nausea, ( - ) heartburn, ( - ) change in bowel habits Skin: ( - ) abnormal skin rashes Lymphatics: ( - ) new lymphadenopathy, ( - ) easy bruising Neurological: ( - ) numbness, ( - ) tingling, ( - ) new weaknesses Behavioral/Psych: ( - ) mood change, ( - ) new changes  All other systems were reviewed with the patient and are negative.  PHYSICAL EXAMINATION: ECOG PERFORMANCE STATUS: 1 - Symptomatic but completely ambulatory  Vitals:   01/27/19 1126  BP: (!) 165/82  Pulse: 63  Resp: 17  Temp: 98 F (36.7 C)  SpO2: 100%   Filed Weights   01/27/19 1126  Weight: 280 lb 4.8 oz (127.1 kg)    GENERAL: well appearing African American male in NAD  SKIN: skin color, texture, turgor are normal, no rashes or significant lesions EYES: conjunctiva are pink and non-injected, sclera clear LUNGS: clear to auscultation and percussion with normal breathing effort HEART: regular rate & rhythm and no murmurs and no lower extremity edema ABDOMEN: soft, non-tender, non-distended, normal bowel sounds Musculoskeletal: no cyanosis of digits and no clubbing  PSYCH: alert & oriented x 3, fluent speech NEURO: no focal motor/sensory deficits  LABORATORY DATA:  I have reviewed the data as listed Lab Results  Component Value Date   WBC 2.6 (L) 01/27/2019   HGB 12.6 (L) 01/27/2019   HCT 37.0 (L) 01/27/2019   MCV 91.4 01/27/2019   PLT 121 (L) 01/27/2019   NEUTROABS 1.1 (L) 01/27/2019    BLOOD FILM:  I personally reviewed the patient's peripheral blood smear today.  There was no peripheral blast.  The white blood cells and red blood cells were of normal morphology. There was no schistocytosis or anisocytosis.  The platelets are of normal size. There is marked clumping notable on the  EDTA smear, with a lesser degree of clumping on the citrated slid.   RADIOGRAPHIC STUDIES: None to review  ASSESSMENT & PLAN Calvin [name]. 68 y.o. male with medical history significant for gout, HTN, and HLD presents for a follow up visit. He has numerous hematological concerns, including normocytic anemia, leukopenia, and MGUS. It is possible that all of these concerns are intertwined. We are currently evaluating these findings with an SPEP and copper testing. His prior nutritional studies were unrevealing, with a normal MMA and now improved levels of iron. His platelets do have some degree of clumping (mostly on EDTA, some on Citrate) which could explain his Plt findings.   Another possible etiology would be a non-secretory myeloma, or potentially higher levels of monoclonal protein in the urine. If no etiology for these findings can be clearly discerned I would recommend proceeding with a bone marrow biopsy to confirm no ongoing hematological process.   Given his constellation of hematological findings and the uncertainty surrounding the drop in his WBC will have him return to clinic in 3 months for repeat labs.   #Normocytic Anemia --Mr. Vise was originally scheduled for IV iron, however this did not occur and he was taking PO iron instead. Iron levels appear replete  --HB electrophoresis confirms sickle cell trait, though he has had normal baseline Hgb before in the past.  --Mr. Donigan was a long time blood donor, donating as frequently as he could for years. He may have developed low iron stores due to this generosity. He last donated in 2018. --appears stable at this time. SPEP and copper levels currently pending. --RTC in 3  months for further review.   #Monoclonal Gammopathy of Undetermined Significance  --last SPEP showed M protein 0.2, kappa 38, lambda 17.6, ratio 2.2 --will repeat SPEP and SFLC today. Additionally will collect a UPEP --patient will require a bone survey  at least yearly.  --continue to monitor at least q 75month.   #Leukopenia --low WBC dating back to 2011, stably low around 3.5-.5.0.  --05/29/2018: negative for HIV, Hep C, but had Hep B core antibody, no surface antigen or surface antibody. --continue to monitor   Orders Placed This Encounter  Procedures  . Multiple Myeloma Panel (SPEP&IFE w/QIG)    Standing Status:   Future    Number of Occurrences:   1    Standing Expiration Date:   01/27/2020  . Kappa/lambda light chains    Standing Status:   Future    Number of Occurrences:   1    Standing Expiration Date:   01/27/2020  . Beta 2 microglobulin    Standing Status:   Future    Number of Occurrences:   1    Standing Expiration Date:   01/27/2020  . Lactate dehydrogenase (LDH)    Standing Status:   Future    Number of Occurrences:   1    Standing Expiration Date:   01/27/2020  . CBC with Differential (Cancer Center Only)    Standing Status:   Future    Number of Occurrences:   1    Standing Expiration Date:   01/27/2020  . Platelet by Citrate    Standing Status:   Future    Number of Occurrences:   1    Standing Expiration Date:   01/27/2020  . CMP ([facility])    Standing Status:   Future    Number of Occurrences:   1    Standing Expiration Date:   01/27/2020  . Save Smear (SSMR)    Standing Status:   Future    Number of Occurrences:   1    Standing Expiration Date:   01/27/2020  . TSH    Standing Status:   Future    Number of Occurrences:   1    Standing Expiration Date:   01/27/2020  . 24-Hr Ur UPEP/UIFE/Light Chains/TP    Standing Status:   Future    Standing Expiration Date:   01/27/2020  . Copper, serum    Standing Status:   Future    Number of Occurrences:   1    Standing Expiration Date:   01/27/2020    All questions were answered. The patient knows to call the clinic with any problems, questions or concerns.  A total of more than 40 minutes were spent face-to-face with the patient during this  encounter and over half of that time was spent on counseling and coordination of care as outlined above.   JLedell Peoples MD Department of Hematology/Oncology [facility]Phone: [phone]: [phone]: [email or web address]  01/28/2019 7:28 PM

## 2019-01-29 DIAGNOSIS — G4733 Obstructive sleep apnea (adult) (pediatric): Secondary | ICD-10-CM | POA: Diagnosis not present

## 2019-01-29 DIAGNOSIS — I10 Essential (primary) hypertension: Secondary | ICD-10-CM | POA: Diagnosis not present

## 2019-01-29 DIAGNOSIS — E785 Hyperlipidemia, unspecified: Secondary | ICD-10-CM | POA: Diagnosis not present

## 2019-01-29 DIAGNOSIS — Z7982 Long term (current) use of aspirin: Secondary | ICD-10-CM | POA: Diagnosis not present

## 2019-01-29 DIAGNOSIS — Z791 Long term (current) use of non-steroidal anti-inflammatories (NSAID): Secondary | ICD-10-CM | POA: Diagnosis not present

## 2019-01-29 DIAGNOSIS — Z87891 Personal history of nicotine dependence: Secondary | ICD-10-CM | POA: Diagnosis not present

## 2019-01-29 DIAGNOSIS — M1990 Unspecified osteoarthritis, unspecified site: Secondary | ICD-10-CM | POA: Diagnosis not present

## 2019-01-29 DIAGNOSIS — Z79899 Other long term (current) drug therapy: Secondary | ICD-10-CM | POA: Diagnosis not present

## 2019-01-29 DIAGNOSIS — D649 Anemia, unspecified: Secondary | ICD-10-CM | POA: Diagnosis not present

## 2019-01-29 DIAGNOSIS — D472 Monoclonal gammopathy: Secondary | ICD-10-CM | POA: Diagnosis not present

## 2019-01-29 DIAGNOSIS — M109 Gout, unspecified: Secondary | ICD-10-CM | POA: Diagnosis not present

## 2019-01-29 DIAGNOSIS — D72819 Decreased white blood cell count, unspecified: Secondary | ICD-10-CM | POA: Diagnosis not present

## 2019-01-29 LAB — MULTIPLE MYELOMA PANEL, SERUM
Albumin SerPl Elph-Mcnc: 3.9 g/dL (ref 2.9–4.4)
Albumin/Glob SerPl: 1.3 (ref 0.7–1.7)
Alpha 1: 0.2 g/dL (ref 0.0–0.4)
Alpha2 Glob SerPl Elph-Mcnc: 0.6 g/dL (ref 0.4–1.0)
B-Globulin SerPl Elph-Mcnc: 1.1 g/dL (ref 0.7–1.3)
Gamma Glob SerPl Elph-Mcnc: 1.2 g/dL (ref 0.4–1.8)
Globulin, Total: 3.1 g/dL (ref 2.2–3.9)
IgA: 165 mg/dL (ref 61–437)
IgG (Immunoglobin G), Serum: 1228 mg/dL (ref 603–1613)
IgM (Immunoglobulin M), Srm: 103 mg/dL (ref 20–172)
M Protein SerPl Elph-Mcnc: 0.2 g/dL — ABNORMAL HIGH
Total Protein ELP: 7 g/dL (ref 6.0–8.5)

## 2019-01-30 LAB — UPEP/UIFE/LIGHT CHAINS/TP, 24-HR UR
% BETA, Urine: 0 %
ALPHA 1 URINE: 0 %
Albumin, U: 100 %
Alpha 2, Urine: 0 %
Free Kappa Lt Chains,Ur: 89.93 mg/L (ref 0.63–113.79)
Free Kappa/Lambda Ratio: 24.05 (ref 1.03–31.76)
Free Lambda Lt Chains,Ur: 3.74 mg/L (ref 0.47–11.77)
GAMMA GLOBULIN URINE: 0 %
Total Protein, Urine-Ur/day: 230 mg/24 hr — ABNORMAL HIGH (ref 30–150)
Total Protein, Urine: 11.8 mg/dL
Total Volume: 1950

## 2019-01-30 LAB — COPPER, SERUM: Copper: 117 ug/dL (ref 72–166)

## 2019-01-31 ENCOUNTER — Telehealth: Payer: Self-pay | Admitting: Hematology and Oncology

## 2019-01-31 ENCOUNTER — Other Ambulatory Visit: Payer: Self-pay

## 2019-01-31 MED ORDER — PANTOPRAZOLE SODIUM 40 MG PO TBEC: 40.0000 mg | DELAYED_RELEASE_TABLET | Freq: Every day | ORAL | 3 refills | Status: DC

## 2019-01-31 MED ORDER — POTASSIUM CHLORIDE CRYS ER 20 MEQ PO TBCR: 20.0000 meq | EXTENDED_RELEASE_TABLET | Freq: Every day | ORAL | 0 refills | Status: DC

## 2019-01-31 NOTE — Telephone Encounter (Signed)
Called Mr. Viator to discuss the results of his blood tests from earlier this week.   He had no clear abnormalities to explain his low blood counts. His MGUS appeared stable. He did have low K at 3.3 (likely 2/2 to antihypertensive therapy).   Called in a brief course of Potassium and recommended increased potassium intake.   RTC scheduled in 3 months time.  Ledell Peoples, MD Department of Hematology/Oncology [place] [facility] Phone: [phone] Pager: [phone] Email: [email or web address]

## 2019-02-04 DIAGNOSIS — G4733 Obstructive sleep apnea (adult) (pediatric): Secondary | ICD-10-CM | POA: Diagnosis not present

## 2019-02-12 ENCOUNTER — Other Ambulatory Visit: Payer: Self-pay | Admitting: Cardiology

## 2019-02-12 DIAGNOSIS — Z20822 Contact with and (suspected) exposure to covid-19: Secondary | ICD-10-CM

## 2019-02-14 LAB — NOVEL CORONAVIRUS, NAA: SARS-CoV-2, NAA: NOT DETECTED

## 2019-02-16 ENCOUNTER — Encounter: Payer: Self-pay | Admitting: Nurse Practitioner

## 2019-02-19 ENCOUNTER — Other Ambulatory Visit: Payer: Self-pay

## 2019-02-19 DIAGNOSIS — Z20822 Contact with and (suspected) exposure to covid-19: Secondary | ICD-10-CM

## 2019-02-21 ENCOUNTER — Encounter: Payer: Self-pay | Admitting: Nurse Practitioner

## 2019-02-21 ENCOUNTER — Telehealth: Payer: Self-pay | Admitting: Unknown Physician Specialty

## 2019-02-21 ENCOUNTER — Other Ambulatory Visit: Payer: Self-pay | Admitting: Unknown Physician Specialty

## 2019-02-21 ENCOUNTER — Telehealth (INDEPENDENT_AMBULATORY_CARE_PROVIDER_SITE_OTHER): Payer: Medicare Other | Admitting: Nurse Practitioner

## 2019-02-21 VITALS — Temp 97.8°F | Ht 67.5 in

## 2019-02-21 DIAGNOSIS — U071 COVID-19: Secondary | ICD-10-CM

## 2019-02-21 DIAGNOSIS — B349 Viral infection, unspecified: Secondary | ICD-10-CM

## 2019-02-21 DIAGNOSIS — J9801 Acute bronchospasm: Secondary | ICD-10-CM

## 2019-02-21 LAB — NOVEL CORONAVIRUS, NAA: SARS-CoV-2, NAA: DETECTED — AB

## 2019-02-21 MED ORDER — ALBUTEROL SULFATE HFA 108 (90 BASE) MCG/ACT IN AERS: 1.0000 | INHALATION_SPRAY | Freq: Four times a day (QID) | RESPIRATORY_TRACT | 0 refills | Status: DC | PRN

## 2019-02-21 MED ORDER — GUAIFENESIN ER 600 MG PO TB12: 600.0000 mg | ORAL_TABLET | Freq: Two times a day (BID) | ORAL | 0 refills | Status: DC | PRN

## 2019-02-21 NOTE — Telephone Encounter (Signed)
I connected by phone with Calvin [name]. on 02/21/2019 at 1:02 PM to discuss the potential use of an new treatment for mild to moderate COVID-19 viral infection in non-hospitalized patients.  Pt with some congestion, low grade fever and fatigue.    This patient is a 68 y.o. male that meets the FDA criteria for Emergency Use Authorization of bamlanivimab or casirivimab\imdevimab.  Has a (+) direct SARS-CoV-2 viral test result  Has mild or moderate COVID-19   Is ? 70 years of age and weighs ? 40 kg  Is NOT hospitalized due to COVID-19  Is NOT requiring oxygen therapy or requiring an increase in baseline oxygen flow rate due to COVID-19  Is within 10 days of symptom onset  Has at least one of the high risk factor(s) for progression to severe COVID-19 and/or hospitalization as defined in EUA.  Specific high risk criteria : >/= 70 yo   I have spoken and communicated the following to the patient or parent/caregiver:  1. FDA has authorized the emergency use of bamlanivimab and casirivimab\imdevimab for the treatment of mild to moderate COVID-19 in adults and pediatric patients with positive results of direct SARS-CoV-2 viral testing who are 62 years of age and older weighing at least 40 kg, and who are at high risk for progressing to severe COVID-19 and/or hospitalization.  2. The significant known and potential risks and benefits of bamlanivimab and casirivimab\imdevimab, and the extent to which such potential risks and benefits are unknown.  3. Information on available alternative treatments and the risks and benefits of those alternatives, including clinical trials.  4. Patients treated with bamlanivimab and casirivimab\imdevimab should continue to self-isolate and use infection control measures (e.g., wear mask, isolate, social distance, avoid sharing personal items, clean and disinfect "high touch" surfaces, and frequent handwashing) according to CDC guidelines.   5. The patient  or parent/caregiver has the option to accept or refuse bamlanivimab or casirivimab\imdevimab .  After reviewing this information with the patient, The patient agreed to proceed with receiving the casirivimab\imdevimab infusion and will be provided a copy of the Fact sheet prior to receiving the infusion.Calvin [name] 02/21/2019 1:02 PM

## 2019-02-21 NOTE — Patient Instructions (Addendum)
Please with appt on Monday to get antiviral agent IV. Hold tart cherry and multivitamins while taking vitamin D, vitamin C and zinc supplement for next 2weeks. Maintain adequate oral hydration and nutrition. Check BP, pulse, oxygen saturation, and temperature daily and record. Send vital sign readings through mychart.  Go to hospital if you develop any respiratory difficulty or unable to maintain adequate oral hydration.  This information is directly available on the CDC website: RunningShows.co.za.html    Source:CDC Reference to specific commercial products, manufacturers, companies, or trademarks does not constitute its endorsement or recommendation by the [place], [place], or Centers for [employer] and Prevention.

## 2019-02-21 NOTE — Progress Notes (Signed)
Virtual Visit via Video Note  I connected with Calvin [name]. on 02/21/19 at  2:00 PM EST by a video enabled telemedicine application and verified that I am speaking with the correct person using two identifiers.  Location: Patient:home Provider: office Participants: patient, wife, and provider I discussed the limitations of evaluation and management by telemedicine and the availability of in person appointments. The patient expressed understanding and agreed to proceed.  CC: pt tested postive for covid on 02/19/2019 --couging yellow mucus at times,chills/sweat, fatigue/tyelenol and vit C,D,zinc/ spouse postive for covid  History of Present Illness: Cough This is a new problem. The current episode started yesterday. The cough is non-productive. Associated symptoms include chills, myalgias and wheezing. Pertinent negatives include no chest pain, ear congestion, ear pain, fever, headaches, heartburn, hemoptysis, nasal congestion, postnasal drip, rash, rhinorrhea, sore throat, shortness of breath, sweats or weight loss. Nothing aggravates the symptoms. He has tried nothing for the symptoms. There is no history of asthma, bronchitis, COPD or environmental allergies.   Reviewed medication list with patient.   Observations/Objective: Physical Exam  Constitutional: He is oriented to person, place, and time. No distress.  Eyes: Conjunctivae and EOM are normal.  Pulmonary/Chest: Effort normal.  Neurological: He is alert and oriented to person, place, and time.  Skin: Skin is dry. No pallor.  Psychiatric: He has a normal mood and affect. His behavior is normal. Thought content normal.  Vitals reviewed.  Assessment and Plan: Mae was seen today for cough.  Diagnoses and all orders for this visit:  COVID-19 -     guaiFENesin (MUCINEX) 600 MG 12 hr tablet; Take 1 tablet (600 mg total) by mouth 2 (two) times daily as needed for cough or to loosen phlegm. -     albuterol (VENTOLIN HFA) 108  (90 Base) MCG/ACT inhaler; Inhale 1-2 puffs into the lungs every 6 (six) hours as needed for wheezing or shortness of breath.  Acute bronchospasm due to viral infection -     guaiFENesin (MUCINEX) 600 MG 12 hr tablet; Take 1 tablet (600 mg total) by mouth 2 (two) times daily as needed for cough or to loosen phlegm. -     albuterol (VENTOLIN HFA) 108 (90 Base) MCG/ACT inhaler; Inhale 1-2 puffs into the lungs every 6 (six) hours as needed for wheezing or shortness of breath.   Follow Up Instructions: See avs   I discussed the assessment and treatment plan with the patient. The patient was provided an opportunity to ask questions and all were answered. The patient agreed with the plan and demonstrated an understanding of the instructions.   The patient was advised to call back or seek an in-person evaluation if the symptoms worsen or if the condition fails to improve as anticipated.   Wilfred Lacy, NP

## 2019-02-24 ENCOUNTER — Encounter (HOSPITAL_COMMUNITY): Payer: Self-pay

## 2019-02-24 ENCOUNTER — Ambulatory Visit (HOSPITAL_COMMUNITY)
Admission: RE | Admit: 2019-02-24 | Discharge: 2019-02-24 | Disposition: A | Discharge status: Home / Self Care | Payer: Medicare Other | Source: Ambulatory Visit | Attending: Critical Care Medicine | Admitting: Critical Care Medicine

## 2019-02-24 DIAGNOSIS — U071 COVID-19: Secondary | ICD-10-CM | POA: Insufficient documentation

## 2019-02-24 DIAGNOSIS — Z23 Encounter for immunization: Secondary | ICD-10-CM | POA: Insufficient documentation

## 2019-02-24 MED ORDER — METHYLPREDNISOLONE SODIUM SUCC 125 MG IJ SOLR: 125.0000 mg | Freq: Once | INTRAMUSCULAR | Status: DC | PRN

## 2019-02-24 MED ORDER — SODIUM CHLORIDE 0.9 % IV SOLN
Freq: Once | INTRAVENOUS | Status: AC
  Administered 2019-02-24: 10:00:00 via INTRAVENOUS
  Filled 2019-02-24: qty 10

## 2019-02-24 MED ORDER — FAMOTIDINE IN NACL 20-0.9 MG/50ML-% IV SOLN: 20.0000 mg | Freq: Once | INTRAVENOUS | Status: DC | PRN

## 2019-02-24 MED ORDER — ALBUTEROL SULFATE HFA 108 (90 BASE) MCG/ACT IN AERS: 2.0000 | INHALATION_SPRAY | Freq: Once | RESPIRATORY_TRACT | Status: DC | PRN

## 2019-02-24 MED ORDER — SODIUM CHLORIDE 0.9 % IV SOLN
INTRAVENOUS | Status: DC | PRN
  Administered 2019-02-24: 10:00:00 250 mL via INTRAVENOUS

## 2019-02-24 MED ORDER — EPINEPHRINE 0.3 MG/0.3ML IJ SOAJ: 0.3000 mg | Freq: Once | INTRAMUSCULAR | Status: DC | PRN

## 2019-02-24 MED ORDER — DIPHENHYDRAMINE HCL 50 MG/ML IJ SOLN: 50.0000 mg | Freq: Once | INTRAMUSCULAR | Status: DC | PRN

## 2019-02-24 NOTE — Progress Notes (Signed)
  Diagnosis: COVID-19  Physician: Wilfred Lacy, MD  Procedure: Covid Infusion Clinic Med: casirivimab\imdevimab infusion - Provided patient with casirivimab\imdevimab fact sheet for patients, parents and caregivers prior to infusion.  Complications: No immediate complications noted.  Discharge: Discharged home   [name] N [name] 02/24/2019

## 2019-02-25 ENCOUNTER — Other Ambulatory Visit: Payer: Self-pay | Admitting: Internal Medicine

## 2019-02-25 ENCOUNTER — Encounter: Payer: Self-pay | Admitting: Nurse Practitioner

## 2019-03-13 ENCOUNTER — Other Ambulatory Visit: Payer: Self-pay

## 2019-03-13 DIAGNOSIS — Z20822 Contact with and (suspected) exposure to covid-19: Secondary | ICD-10-CM

## 2019-03-15 LAB — NOVEL CORONAVIRUS, NAA: SARS-CoV-2, NAA: NOT DETECTED

## 2019-03-20 ENCOUNTER — Encounter: Payer: Self-pay | Admitting: Hematology and Oncology

## 2019-04-03 ENCOUNTER — Other Ambulatory Visit: Payer: Self-pay | Admitting: Nurse Practitioner

## 2019-04-03 DIAGNOSIS — E781 Pure hyperglyceridemia: Secondary | ICD-10-CM

## 2019-04-03 MED ORDER — FENOFIBRATE 145 MG PO TABS: 145.0000 mg | ORAL_TABLET | Freq: Every day | ORAL | 0 refills | Status: DC

## 2019-04-03 NOTE — Telephone Encounter (Signed)
Pt request Fenofibrate send to [facility].

## 2019-04-04 ENCOUNTER — Telehealth: Payer: Self-pay | Admitting: Hematology and Oncology

## 2019-04-04 NOTE — Telephone Encounter (Signed)
Returned patient's phone call regarding scheduling an appointment, left a voicemail. 

## 2019-04-07 ENCOUNTER — Telehealth: Payer: Self-pay | Admitting: Hematology and Oncology

## 2019-04-07 NOTE — Telephone Encounter (Signed)
Returned patient's phone call regarding scheduling an appointment, scheduled er 11/16 los. Patient is notified.

## 2019-04-22 ENCOUNTER — Other Ambulatory Visit: Payer: Self-pay

## 2019-04-23 ENCOUNTER — Ambulatory Visit (INDEPENDENT_AMBULATORY_CARE_PROVIDER_SITE_OTHER): Payer: Medicare Other | Admitting: Nurse Practitioner

## 2019-04-23 ENCOUNTER — Encounter: Payer: Self-pay | Admitting: Nurse Practitioner

## 2019-04-23 VITALS — BP 128/78 | HR 64 | Temp 95.4°F | Ht 67.5 in | Wt 266.2 lb

## 2019-04-23 DIAGNOSIS — R739 Hyperglycemia, unspecified: Secondary | ICD-10-CM

## 2019-04-23 DIAGNOSIS — E782 Mixed hyperlipidemia: Secondary | ICD-10-CM

## 2019-04-23 DIAGNOSIS — I10 Essential (primary) hypertension: Secondary | ICD-10-CM

## 2019-04-23 LAB — BASIC METABOLIC PANEL
BUN: 16 mg/dL (ref 6–23)
CO2: 28 mEq/L (ref 19–32)
Calcium: 9.3 mg/dL (ref 8.4–10.5)
Chloride: 106 mEq/L (ref 96–112)
Creatinine, Ser: 1.47 mg/dL (ref 0.40–1.50)
GFR: 57.19 mL/min — ABNORMAL LOW (ref >60.00)
Glucose, Bld: 88 mg/dL (ref 70–99)
Potassium: 3.7 mEq/L (ref 3.5–5.1)
Sodium: 141 mEq/L (ref 135–145)

## 2019-04-23 LAB — LIPID PANEL
Cholesterol: 174 mg/dL (ref 0–200)
HDL: 22.9 mg/dL — ABNORMAL LOW (ref >39.00)
LDL Cholesterol: 123 mg/dL — ABNORMAL HIGH (ref 0–99)
NonHDL: 151.26
Total CHOL/HDL Ratio: 8
Triglycerides: 143 mg/dL (ref 0.0–149.0)
VLDL: 28.6 mg/dL (ref 0.0–40.0)

## 2019-04-23 LAB — HEMOGLOBIN A1C: Hgb A1c MFr Bld: 5.1 % (ref 4.6–6.5)

## 2019-04-23 MED ORDER — BD ASSURE BPM/AUTO ARM CUFF MISC: 1.0000 [IU] | Freq: Every day | 0 refills | Status: DC

## 2019-04-23 NOTE — Patient Instructions (Addendum)
Stable renal function and hgbA1c at 5.1 Improved lipid panel. Continue pravastatin and fenofibrate at lower dose. New rx sent Continue DASH diet and daily exercise  You need to wait at least 2weeks after COVID vaccine before getting any other vaccine.  Monitor BP 2-3x/week using large BP cuff.  Shingrix and TDAP vaccine can only be administered by your retail pharmacist per your insurance.  Maintain DASh diet.  Next Annual Wellness appt is due 08/2019   DASH Eating Plan DASH stands for "Dietary Approaches to Stop Hypertension." The DASH eating plan is a healthy eating plan that has been shown to reduce high blood pressure (hypertension). It may also reduce your risk for type 2 diabetes, heart disease, and stroke. The DASH eating plan may also help with weight loss. What are tips for following this plan?  General guidelines  Avoid eating more than 2,300 mg (milligrams) of salt (sodium) a day. If you have hypertension, you may need to reduce your sodium intake to 1,500 mg a day.  Limit alcohol intake to no more than 1 drink a day for nonpregnant women and 2 drinks a day for men. One drink equals 12 oz of beer, 5 oz of wine, or 1 oz of hard liquor.  Work with your health care provider to maintain a healthy body weight or to lose weight. Ask what an ideal weight is for you.  Get at least 30 minutes of exercise that causes your heart to beat faster (aerobic exercise) most days of the week. Activities may include walking, swimming, or biking.  Work with your health care provider or diet and nutrition specialist (dietitian) to adjust your eating plan to your individual calorie needs. Reading food labels   Check food labels for the amount of sodium per serving. Choose foods with less than 5 percent of the Daily Value of sodium. Generally, foods with less than 300 mg of sodium per serving fit into this eating plan.  To find whole grains, look for the word "whole" as the first word in the  ingredient list. Shopping  Buy products labeled as "low-sodium" or "no salt added."  Buy fresh foods. Avoid canned foods and premade or frozen meals. Cooking  Avoid adding salt when cooking. Use salt-free seasonings or herbs instead of table salt or sea salt. Check with your health care provider or pharmacist before using salt substitutes.  Do not fry foods. Cook foods using healthy methods such as baking, boiling, grilling, and broiling instead.  Cook with heart-healthy oils, such as olive, canola, soybean, or sunflower oil. Meal planning  Eat a balanced diet that includes: ? 5 or more servings of fruits and vegetables each day. At each meal, try to fill half of your plate with fruits and vegetables. ? Up to 6-8 servings of whole grains each day. ? Less than 6 oz of lean meat, poultry, or fish each day. A 3-oz serving of meat is about the same size as a deck of cards. One egg equals 1 oz. ? 2 servings of low-fat dairy each day. ? A serving of nuts, seeds, or beans 5 times each week. ? Heart-healthy fats. Healthy fats called Omega-3 fatty acids are found in foods such as flaxseeds and coldwater fish, like sardines, salmon, and mackerel.  Limit how much you eat of the following: ? Canned or prepackaged foods. ? Food that is high in trans fat, such as fried foods. ? Food that is high in saturated fat, such as fatty meat. ? Sweets, desserts,  sugary drinks, and other foods with added sugar. ? Full-fat dairy products.  Do not salt foods before eating.  Try to eat at least 2 vegetarian meals each week.  Eat more home-cooked food and less restaurant, buffet, and fast food.  When eating at a restaurant, ask that your food be prepared with less salt or no salt, if possible. What foods are recommended? The items listed may not be a complete list. Talk with your dietitian about what dietary choices are best for you. Grains Whole-grain or whole-wheat bread. Whole-grain or whole-wheat  pasta. Brown rice. [place]. Bulgur. Whole-grain and low-sodium cereals. Pita bread. Low-fat, low-sodium crackers. Whole-wheat flour tortillas. Vegetables Fresh or frozen vegetables (raw, steamed, roasted, or grilled). Low-sodium or reduced-sodium tomato and vegetable juice. Low-sodium or reduced-sodium tomato sauce and tomato paste. Low-sodium or reduced-sodium canned vegetables. Fruits All fresh, dried, or frozen fruit. Canned fruit in natural juice (without added sugar). Meat and other protein foods Skinless chicken or [place]. Ground chicken or [place]. Pork with fat trimmed off. Fish and seafood. Egg whites. Dried beans, peas, or lentils. Unsalted nuts, nut butters, and seeds. Unsalted canned beans. Lean cuts of beef with fat trimmed off. Low-sodium, lean deli meat. Dairy Low-fat (1%) or fat-free (skim) milk. Fat-free, low-fat, or reduced-fat cheeses. Nonfat, low-sodium ricotta or cottage cheese. Low-fat or nonfat yogurt. Low-fat, low-sodium cheese. Fats and oils Soft margarine without trans fats. Vegetable oil. Low-fat, reduced-fat, or light mayonnaise and salad dressings (reduced-sodium). Canola, safflower, olive, soybean, and sunflower oils. Avocado. Seasoning and other foods Herbs. Spices. Seasoning mixes without salt. Unsalted popcorn and pretzels. Fat-free sweets. What foods are not recommended? The items listed may not be a complete list. Talk with your dietitian about what dietary choices are best for you. Grains Baked goods made with fat, such as croissants, muffins, or some breads. Dry pasta or rice meal packs. Vegetables Creamed or fried vegetables. Vegetables in a cheese sauce. Regular canned vegetables (not low-sodium or reduced-sodium). Regular canned tomato sauce and paste (not low-sodium or reduced-sodium). Regular tomato and vegetable juice (not low-sodium or reduced-sodium). Angie Fava. Olives. Fruits Canned fruit in a light or heavy syrup. Fried fruit. Fruit in cream or  butter sauce. Meat and other protein foods Fatty cuts of meat. Ribs. Fried meat. Berniece Salines. Sausage. Bologna and other processed lunch meats. Salami. Fatback. Hotdogs. Bratwurst. Salted nuts and seeds. Canned beans with added salt. Canned or smoked fish. Whole eggs or egg yolks. Chicken or [place] with skin. Dairy Whole or 2% milk, cream, and half-and-half. Whole or full-fat cream cheese. Whole-fat or sweetened yogurt. Full-fat cheese. Nondairy creamers. Whipped toppings. Processed cheese and cheese spreads. Fats and oils Butter. Stick margarine. Lard. Shortening. Ghee. Bacon fat. Tropical oils, such as coconut, palm kernel, or palm oil. Seasoning and other foods Salted popcorn and pretzels. Onion salt, garlic salt, seasoned salt, table salt, and sea salt. Worcestershire sauce. Tartar sauce. Barbecue sauce. Teriyaki sauce. Soy sauce, including reduced-sodium. Steak sauce. Canned and packaged gravies. Fish sauce. Oyster sauce. Cocktail sauce. Horseradish that you find on the shelf. Ketchup. Mustard. Meat flavorings and tenderizers. Bouillon cubes. Hot sauce and Tabasco sauce. Premade or packaged marinades. Premade or packaged taco seasonings. Relishes. Regular salad dressings. Where to find more information:  National Heart, Lung, and [place]: https://wilson-eaton.com/  American Heart Association: www.heart.org Summary  The DASH eating plan is a healthy eating plan that has been shown to reduce high blood pressure (hypertension). It may also reduce your risk for type 2 diabetes, heart disease,  and stroke.  With the DASH eating plan, you should limit salt (sodium) intake to 2,300 mg a day. If you have hypertension, you may need to reduce your sodium intake to 1,500 mg a day.  When on the DASH eating plan, aim to eat more fresh fruits and vegetables, whole grains, lean proteins, low-fat dairy, and heart-healthy fats.  Work with your health care provider or diet and nutrition specialist (dietitian) to  adjust your eating plan to your individual calorie needs. This information is not intended to replace advice given to you by your health care provider. Make sure you discuss any questions you have with your health care provider. Document Revised: 02/09/2017 Document Reviewed: 02/21/2016 Elsevier Patient Education  2020 [employer].

## 2019-04-23 NOTE — Progress Notes (Signed)
Subjective:  Patient ID: Calvin [name]., male    DOB: Oct 21, 1950  Age: 69 y.o. MRN: [id number]  CC: Follow-up (3 mo follow up on DM/BP and lipid/pt is fasting, 2nd shingle dose.)  HPI 1st COVID vaccine administered 1week ago.  HTN: BP at goal today. Reports he made change to his diet in last 4weeks. 26lbs weight loss noted. He reports he feel better: "no joint pain and moving better" BP Readings from Last 3 Encounters:  04/23/19 128/78  02/24/19 (!) 160/87  01/27/19 (!) 165/82   Wt Readings from Last 3 Encounters:  04/23/19 266 lb 3.2 oz (120.7 kg)  01/27/19 280 lb 4.8 oz (127.1 kg)  01/22/19 281 lb 3.2 oz (127.6 kg)   Reviewed past Medical, Social and Family history today.  Outpatient Medications Prior to Visit  Medication Sig Dispense Refill  . albuterol (VENTOLIN HFA) 108 (90 Base) MCG/ACT inhaler Inhale 1-2 puffs into the lungs every 6 (six) hours as needed for wheezing or shortness of breath. 6.7 g 0  . allopurinol (ZYLOPRIM) 300 MG tablet TAKE 1 TABLET BY MOUTH  DAILY 90 tablet 1  . aspirin 81 MG tablet Take 81 mg by mouth daily.      . finasteride (PROSCAR) 5 MG tablet Take 1 tablet (5 mg total) by mouth daily. 90 tablet 3  . loratadine (CLARITIN) 10 MG tablet Take 10 mg by mouth daily as needed for allergies.    . nabumetone (RELAFEN) 500 MG tablet TAKE 1 TABLET(500 MG) BY MOUTH DAILY 90 tablet 1  . pantoprazole (PROTONIX) 40 MG tablet Take 1 tablet (40 mg total) by mouth daily. 90 tablet 3  . sildenafil (VIAGRA) 100 MG tablet Take 1 tablet (100 mg total) by mouth as needed for erectile dysfunction. Yearly physical w/labs due in February must see MD for refills 10 tablet 0  . tamsulosin (FLOMAX) 0.4 MG CAPS capsule TAKE 1 CAPSULE BY MOUTH  DAILY 90 capsule 3  . verapamil (CALAN-SR) 240 MG CR tablet TAKE 1 TABLET BY MOUTH AT  BEDTIME 90 tablet 3  . colchicine 0.6 MG tablet Take 0.5 tablets (0.3 mg total) by mouth daily. 45 tablet 1  . fenofibrate (TRICOR) 145 MG  tablet Take 1 tablet (145 mg total) by mouth daily. 90 tablet 0  . irbesartan (AVAPRO) 300 MG tablet TAKE 1 TABLET BY MOUTH  DAILY 90 tablet 3  . pravastatin (PRAVACHOL) 40 MG tablet TAKE 1 TABLET BY MOUTH  DAILY 90 tablet 3  . Ferrous Sulfate (IRON PO) Take 65 mg by mouth. Vitron Iron+ Vit C.    . guaiFENesin (MUCINEX) 600 MG 12 hr tablet Take 1 tablet (600 mg total) by mouth 2 (two) times daily as needed for cough or to loosen phlegm. (Patient not taking: Reported on 04/23/2019) 14 tablet 0  . Misc Natural Products (TART CHERRY ADVANCED PO) Take by mouth. otc for gout    . Multiple Vitamins-Minerals (CENTRUM MULTI + OMEGA 3 PO) Take by mouth.    . potassium chloride SA (KLOR-CON) 20 MEQ tablet Take 1 tablet (20 mEq total) by mouth daily. Continue for 14 days to increase potassium levels. (Patient not taking: Reported on 04/23/2019) 14 tablet 0   No facility-administered medications prior to visit.    ROS See HPI  Objective:  BP 128/78   Pulse 64   Temp (!) 95.4 F (35.2 C) (Tympanic)   Ht 5' 7.5" (1.715 m)   Wt 266 lb 3.2 oz (120.7 kg)   SpO2  100%   BMI 41.08 kg/m   BP Readings from Last 3 Encounters:  04/23/19 128/78  02/24/19 (!) 160/87  01/27/19 (!) 165/82   Wt Readings from Last 3 Encounters:  04/23/19 266 lb 3.2 oz (120.7 kg)  01/27/19 280 lb 4.8 oz (127.1 kg)  01/22/19 281 lb 3.2 oz (127.6 kg)    Physical Exam Vitals reviewed.  Constitutional:      Appearance: He is obese.  Cardiovascular:     Rate and Rhythm: Normal rate and regular rhythm.     Pulses: Normal pulses.     Heart sounds: Normal heart sounds.  Pulmonary:     Effort: Pulmonary effort is normal.     Breath sounds: Normal breath sounds.  Musculoskeletal:     Right lower leg: No edema.     Left lower leg: No edema.  Neurological:     Mental Status: He is alert and oriented to person, place, and time.    Lab Results  Component Value Date   WBC 2.6 (L) 01/27/2019   HGB 12.6 (L) 01/27/2019   HCT  37.0 (L) 01/27/2019   PLT 121 (L) 01/27/2019   GLUCOSE 88 04/23/2019   CHOL 174 04/23/2019   TRIG 143.0 04/23/2019   HDL 22.90 (L) 04/23/2019   LDLDIRECT 61.0 11/26/2018   LDLCALC 123 (H) 04/23/2019   ALT 19 01/27/2019   AST 17 01/27/2019   NA 141 04/23/2019   K 3.7 04/23/2019   CL 106 04/23/2019   CREATININE 1.47 04/23/2019   BUN 16 04/23/2019   CO2 28 04/23/2019   TSH 1.338 01/27/2019   PSA 2.17 05/09/2018   INR 1.0 05/29/2018   HGBA1C 5.1 04/23/2019   MICROALBUR 5.9 01/22/2019    Assessment & Plan:  This visit occurred during the SARS-CoV-2 public health emergency.  Safety protocols were in place, including screening questions prior to the visit, additional usage of staff PPE, and extensive cleaning of exam room while observing appropriate contact time as indicated for disinfecting solutions.   Calvin [name] was seen today for follow-up.  Diagnoses and all orders for this visit:  Essential hypertension -     Basic metabolic panel -     Discontinue: Blood Pressure Monitoring (B-D ASSURE BPM/AUTO ARM CUFF) MISC; 1 Units by Does not apply route daily. Arm circumference: 40cm -     Blood Pressure Monitoring (B-D ASSURE BPM/AUTO ARM CUFF) MISC; 1 Units by Does not apply route daily. Arm circumference: 40cm -     irbesartan (AVAPRO) 300 MG tablet; Take 1 tablet (300 mg total) by mouth daily.  Hyperglycemia -     Hemoglobin A1c  Mixed hyperlipidemia -     Lipid panel -     fenofibrate 54 MG tablet; Take 1 tablet (54 mg total) by mouth daily. -     pravastatin (PRAVACHOL) 20 MG tablet; Take 1 tablet (20 mg total) by mouth daily.   I have discontinued Bobbe Medico Jr.'s colchicine and potassium chloride SA. I have also changed his irbesartan, fenofibrate, and pravastatin. Additionally, I am having him maintain his aspirin, sildenafil, loratadine, nabumetone, finasteride, Multiple Vitamins-Minerals (CENTRUM MULTI + OMEGA 3 PO), tamsulosin, Ferrous Sulfate (IRON PO), pantoprazole,  Misc Natural Products (TART CHERRY ADVANCED PO), guaiFENesin, albuterol, verapamil, allopurinol, and B-D ASSURE BPM/AUTO ARM CUFF.  Meds ordered this encounter  Medications  . DISCONTD: Blood Pressure Monitoring (B-D ASSURE BPM/AUTO ARM CUFF) MISC    Sig: 1 Units by Does not apply route daily. Arm circumference: 40cm  Dispense:  1 each    Refill:  0    Order Specific Question:   Supervising Provider    Answer:   Lucille Passy [[id number]]  . Blood Pressure Monitoring (B-D ASSURE BPM/AUTO ARM CUFF) MISC    Sig: 1 Units by Does not apply route daily. Arm circumference: 40cm    Dispense:  1 each    Refill:  0    Order Specific Question:   Supervising Provider    Answer:   Lucille Passy [[id number]]  . irbesartan (AVAPRO) 300 MG tablet    Sig: Take 1 tablet (300 mg total) by mouth daily.    Dispense:  90 tablet    Refill:  3    Requesting 1 year supply    Order Specific Question:   Supervising Provider    Answer:   Lucille Passy [[id number]]  . fenofibrate 54 MG tablet    Sig: Take 1 tablet (54 mg total) by mouth daily.    Dispense:  90 tablet    Refill:  1    Change in dose    Order Specific Question:   Supervising Provider    Answer:   Lucille Passy [[id number]]  . pravastatin (PRAVACHOL) 20 MG tablet    Sig: Take 1 tablet (20 mg total) by mouth daily.    Dispense:  90 tablet    Refill:  1    Change in dose    Order Specific Question:   Supervising Provider    Answer:   Lucille Passy [[id number]]    Problem List Items Addressed This Visit      Cardiovascular and Mediastinum   Essential hypertension - Primary    Improved with change in diet and medication compliance. BP Readings from Last 3 Encounters:  04/23/19 128/78  02/24/19 (!) 160/87  01/27/19 (!) 165/82        Relevant Medications   Blood Pressure Monitoring (B-D ASSURE BPM/AUTO ARM CUFF) MISC   irbesartan (AVAPRO) 300 MG tablet   fenofibrate 54 MG tablet   pravastatin (PRAVACHOL) 20 MG tablet   Other Relevant Orders   Basic  metabolic panel (Completed)     Other   Hyperglycemia   Relevant Orders   Hemoglobin A1c (Completed)   Hypertriglyceridemia   Relevant Medications   irbesartan (AVAPRO) 300 MG tablet   fenofibrate 54 MG tablet   pravastatin (PRAVACHOL) 20 MG tablet      Follow-up: Return in about 3 months (around 07/21/2019) for HTN  (video appt, 43mins).  Wilfred Lacy, NP

## 2019-04-24 ENCOUNTER — Encounter: Payer: Self-pay | Admitting: Nurse Practitioner

## 2019-04-24 MED ORDER — PRAVASTATIN SODIUM 20 MG PO TABS: 20.0000 mg | ORAL_TABLET | Freq: Every day | ORAL | 1 refills | Status: DC

## 2019-04-24 MED ORDER — IRBESARTAN 300 MG PO TABS: 300.0000 mg | ORAL_TABLET | Freq: Every day | ORAL | 3 refills | Status: DC

## 2019-04-24 MED ORDER — FENOFIBRATE 54 MG PO TABS: 54.0000 mg | ORAL_TABLET | Freq: Every day | ORAL | 1 refills | Status: DC

## 2019-04-24 NOTE — Assessment & Plan Note (Signed)
Improved with change in diet and medication compliance. BP Readings from Last 3 Encounters:  04/23/19 128/78  02/24/19 (!) 160/87  01/27/19 (!) 165/82

## 2019-04-25 ENCOUNTER — Telehealth: Payer: Self-pay | Admitting: Nurse Practitioner

## 2019-04-25 ENCOUNTER — Ambulatory Visit: Payer: Medicare Other | Admitting: Nurse Practitioner

## 2019-04-25 NOTE — Telephone Encounter (Signed)
Patient is calling and requesting to a call back regarding medication. [phone].

## 2019-04-25 NOTE — Telephone Encounter (Signed)
Pt call back and stated the stop taking pravastatin since 01/2019. The only thing he is taking is fenofibrate 145 mg tab.   Pt is not sure if this makes any different with [facility] decision on lipid medication because we sent in pravastatin and fenofibrate with lower dose.   Please advise

## 2019-04-25 NOTE — Telephone Encounter (Signed)
Pt is aware and agree.   Pt request these meds removed--do not needed any longer. Rx discontinued.   Albuterol inhaler Ferrous sulfate Guaifenesin Tart cherry Multivitamin   Macie Burows

## 2019-04-25 NOTE — Telephone Encounter (Signed)
I am aware pravastatin was put on hold before. Yes I want him to resume pravastatin and fenofibrate at lower dose.

## 2019-04-30 ENCOUNTER — Inpatient Hospital Stay: Payer: Medicare Other

## 2019-04-30 ENCOUNTER — Inpatient Hospital Stay: Payer: Medicare Other | Attending: Hematology and Oncology | Admitting: Hematology and Oncology

## 2019-04-30 ENCOUNTER — Other Ambulatory Visit: Payer: Self-pay

## 2019-04-30 VITALS — BP 167/84 | HR 60 | Temp 97.4°F | Resp 20 | Ht 67.5 in | Wt 268.3 lb

## 2019-04-30 DIAGNOSIS — Z8616 Personal history of COVID-19: Secondary | ICD-10-CM | POA: Diagnosis not present

## 2019-04-30 DIAGNOSIS — M129 Arthropathy, unspecified: Secondary | ICD-10-CM | POA: Diagnosis not present

## 2019-04-30 DIAGNOSIS — D61818 Other pancytopenia: Secondary | ICD-10-CM | POA: Diagnosis not present

## 2019-04-30 DIAGNOSIS — Z87891 Personal history of nicotine dependence: Secondary | ICD-10-CM | POA: Diagnosis present

## 2019-04-30 DIAGNOSIS — G4730 Sleep apnea, unspecified: Secondary | ICD-10-CM | POA: Diagnosis not present

## 2019-04-30 DIAGNOSIS — D500 Iron deficiency anemia secondary to blood loss (chronic): Secondary | ICD-10-CM

## 2019-04-30 DIAGNOSIS — M109 Gout, unspecified: Secondary | ICD-10-CM | POA: Diagnosis not present

## 2019-04-30 DIAGNOSIS — D472 Monoclonal gammopathy: Secondary | ICD-10-CM | POA: Insufficient documentation

## 2019-04-30 DIAGNOSIS — E785 Hyperlipidemia, unspecified: Secondary | ICD-10-CM | POA: Insufficient documentation

## 2019-04-30 DIAGNOSIS — I10 Essential (primary) hypertension: Secondary | ICD-10-CM | POA: Insufficient documentation

## 2019-04-30 DIAGNOSIS — D509 Iron deficiency anemia, unspecified: Secondary | ICD-10-CM | POA: Diagnosis not present

## 2019-04-30 DIAGNOSIS — D72819 Decreased white blood cell count, unspecified: Secondary | ICD-10-CM | POA: Insufficient documentation

## 2019-04-30 DIAGNOSIS — D573 Sickle-cell trait: Secondary | ICD-10-CM | POA: Insufficient documentation

## 2019-04-30 DIAGNOSIS — D649 Anemia, unspecified: Secondary | ICD-10-CM | POA: Diagnosis present

## 2019-04-30 DIAGNOSIS — Z79899 Other long term (current) drug therapy: Secondary | ICD-10-CM | POA: Diagnosis not present

## 2019-04-30 LAB — CBC WITH DIFFERENTIAL (CANCER CENTER ONLY)
Abs Immature Granulocytes: 0 10*3/uL (ref 0.00–0.07)
Basophils Absolute: 0 10*3/uL (ref 0.0–0.1)
Basophils Relative: 0 %
Eosinophils Absolute: 0 10*3/uL (ref 0.0–0.5)
Eosinophils Relative: 1 %
HCT: 37.4 % — ABNORMAL LOW (ref 39.0–52.0)
Hemoglobin: 13 g/dL (ref 13.0–17.0)
Immature Granulocytes: 0 %
Lymphocytes Relative: 48 %
Lymphs Abs: 1.3 10*3/uL (ref 0.7–4.0)
MCH: 31.4 pg (ref 26.0–34.0)
MCHC: 34.8 g/dL (ref 30.0–36.0)
MCV: 90.3 fL (ref 80.0–100.0)
Monocytes Absolute: 0.2 10*3/uL (ref 0.1–1.0)
Monocytes Relative: 8 %
Neutro Abs: 1.2 10*3/uL — ABNORMAL LOW (ref 1.7–7.7)
Neutrophils Relative %: 43 %
Platelet Count: 149 10*3/uL — ABNORMAL LOW (ref 150–400)
RBC: 4.14 MIL/uL — ABNORMAL LOW (ref 4.22–5.81)
RDW: 13.3 % (ref 11.5–15.5)
WBC Count: 2.7 10*3/uL — ABNORMAL LOW (ref 4.0–10.5)
nRBC: 0 % (ref 0.0–0.2)

## 2019-04-30 LAB — LACTATE DEHYDROGENASE: LDH: 153 U/L (ref 98–192)

## 2019-04-30 LAB — CMP (CANCER CENTER ONLY)
ALT: 23 U/L (ref 0–44)
AST: 23 U/L (ref 15–41)
Albumin: 4.4 g/dL (ref 3.5–5.0)
Alkaline Phosphatase: 53 U/L (ref 38–126)
Anion gap: 7 (ref 5–15)
BUN: 16 mg/dL (ref 8–23)
CO2: 27 mmol/L (ref 22–32)
Calcium: 8.9 mg/dL (ref 8.9–10.3)
Chloride: 109 mmol/L (ref 98–111)
Creatinine: 1.45 mg/dL — ABNORMAL HIGH (ref 0.61–1.24)
GFR, Est AFR Am: 56 mL/min — ABNORMAL LOW (ref >60)
GFR, Estimated: 48 mL/min — ABNORMAL LOW (ref >60)
Glucose, Bld: 87 mg/dL (ref 70–99)
Potassium: 3.8 mmol/L (ref 3.5–5.1)
Sodium: 143 mmol/L (ref 135–145)
Total Bilirubin: 0.7 mg/dL (ref 0.3–1.2)
Total Protein: 7.5 g/dL (ref 6.5–8.1)

## 2019-04-30 LAB — IRON AND TIBC
Iron: 114 ug/dL (ref 42–163)
Saturation Ratios: 27 % (ref 20–55)
TIBC: 418 ug/dL — ABNORMAL HIGH (ref 202–409)
UIBC: 304 ug/dL (ref 117–376)

## 2019-04-30 LAB — FERRITIN: Ferritin: 58 ng/mL (ref 24–336)

## 2019-04-30 LAB — SAVE SMEAR(SSMR), FOR PROVIDER SLIDE REVIEW

## 2019-04-30 NOTE — Progress Notes (Signed)
[place] [phone]   [phone]  PROGRESS NOTE  Patient Care Team: Nche, Charlene Brooke, NP as PCP - General (Internal Medicine) Franchot Gallo, MD as Consulting Physician (Urology) Netta Cedars, MD as Consulting Physician (Orthopedic Surgery)  Hematological/Oncological History  # Normocytic Anemia 1) 05/29/2018: HB electrophoresis confirms sickle cell trait. Ferritin 18. Hgb 13.1. MMA 107 2) 01/22/2019: iron 112, TIBC 313, Sat 36%, ferritin 31. Hgb 12.6 3) 01/27/2019: establish care with Dr. Lorenso Courier. WBC 2.6, Hgb 12.6, Plt 121, MCV 91.4  #MGUS 1) 09/18/18:  M protein 0.2, kappa 38, lambda 17.6, ratio 2.2 2) 01/27/2019: establish care with Dr. Lorenso Courier   #Leukopenia 1) Chronically low, with WBC 3.5 dating back to 03/24/2009 2) 2015-2020: stable at baseline WBC 3.5-5.0 3) 01/27/2019: establish care with Dr. Lorenso Courier.  WBC 2.6, Hgb 12.6, Plt 121, MCV 91.4  Interval History:  Calvin [name]. 69 y.o. male with medical history significant for MGUS presents for a follow up visit. He was last seen on 01/27/2019 at which time he established care with our clinic. In the interim since our last visit he has had no ED visits or hospitalizations, though he was diagnosed with COVID in Dec 2020 and lost weight during that infection.    On exam today Calvin [name] notes that he feels well.  He reports that he has recovered well from his Covid infection in December.  He reports that he did have some chills fevers and loss of appetite during that episode and lost approximately 30 pounds.  He dropped down to 255, however on exam today his weight has increased up to 268.  He reports that he is eating well and that his energy levels are excellent since he lost that weight.  He reports no shortness of breath, fatigue, or chest pain.  He denies having any issues with bleeding, bruising, dark stools. He denies any bone or back pain.  Full 10 point ROS is listed  below.  MEDICAL HISTORY:  Past Medical History:  Diagnosis Date  . Allergy   . Arthritis   . Diverticulosis   . Gout   . Hyperlipidemia   . Hypertension   . Iron deficiency anemia 09/20/2018  . OSA (obstructive sleep apnea) 11/13/2013  . Sickle cell trait ([place])   . Sleep apnea     SURGICAL HISTORY: Past Surgical History:  Procedure Laterality Date  . COLONOSCOPY    . POLYPECTOMY    . ROTATOR CUFF REPAIR     left  . THROAT SURGERY     polyps removed from vocal cord    SOCIAL HISTORY: Social History   Socioeconomic History  . Marital status: Married    Spouse name: Not on file  . Number of children: 1  . Years of education: Not on file  . Highest education level: Not on file  Occupational History  . Occupation: retired  Tobacco Use  . Smoking status: Former Smoker    Packs/day: 2.00    Years: 5.00    Pack years: 10.00    Types: Cigarettes    Quit date: 05/23/1977    Years since quitting: 41.9  . Smokeless tobacco: Never Used  Substance and Sexual Activity  . Alcohol use: No    Comment: QUIT 2002  . Drug use: No    Comment: Completed program in april 2000  . Sexual activity: Yes  Other Topics Concern  . Not on file  Social History Narrative  . Not on file  Social Determinants of Health   Financial Resource Strain:   . Difficulty of Paying Living Expenses: Not on file  Food Insecurity:   . Worried About Charity fundraiser in the Last Year: Not on file  . Ran Out of Food in the Last Year: Not on file  Transportation Needs:   . Lack of Transportation (Medical): Not on file  . Lack of Transportation (Non-Medical): Not on file  Physical Activity: Insufficiently Active  . Days of Exercise per Week: 3 days  . Minutes of Exercise per Session: 40 min  Stress:   . Feeling of Stress : Not on file  Social Connections:   . Frequency of Communication with Friends and Family: Not on file  . Frequency of Social Gatherings with Friends and Family: Not on file  .  Attends Religious Services: Not on file  . Active Member of Clubs or Organizations: Not on file  . Attends Archivist Meetings: Not on file  . Marital Status: Not on file  Intimate Partner Violence:   . Fear of Current or Ex-Partner: Not on file  . Emotionally Abused: Not on file  . Physically Abused: Not on file  . Sexually Abused: Not on file    FAMILY HISTORY: Family History  Problem Relation Age of Onset  . Colon cancer Neg Hx   . Esophageal cancer Neg Hx   . Rectal cancer Neg Hx   . Stomach cancer Neg Hx     ALLERGIES:  has No Known Allergies.  MEDICATIONS:  Current Outpatient Medications  Medication Sig Dispense Refill  . allopurinol (ZYLOPRIM) 300 MG tablet TAKE 1 TABLET BY MOUTH  DAILY 90 tablet 1  . aspirin 81 MG tablet Take 81 mg by mouth daily.      . Blood Pressure Monitoring (B-D ASSURE BPM/AUTO ARM CUFF) MISC 1 Units by Does not apply route daily. Arm circumference: 40cm 1 each 0  . fenofibrate 54 MG tablet Take 1 tablet (54 mg total) by mouth daily. 90 tablet 1  . finasteride (PROSCAR) 5 MG tablet Take 1 tablet (5 mg total) by mouth daily. 90 tablet 3  . irbesartan (AVAPRO) 300 MG tablet Take 1 tablet (300 mg total) by mouth daily. 90 tablet 3  . loratadine (CLARITIN) 10 MG tablet Take 10 mg by mouth daily as needed for allergies.    . nabumetone (RELAFEN) 500 MG tablet TAKE 1 TABLET(500 MG) BY MOUTH DAILY 90 tablet 1  . pantoprazole (PROTONIX) 40 MG tablet Take 1 tablet (40 mg total) by mouth daily. 90 tablet 3  . pravastatin (PRAVACHOL) 20 MG tablet Take 1 tablet (20 mg total) by mouth daily. 90 tablet 1  . sildenafil (VIAGRA) 100 MG tablet Take 1 tablet (100 mg total) by mouth as needed for erectile dysfunction. Yearly physical w/labs due in February must see MD for refills 10 tablet 0  . tamsulosin (FLOMAX) 0.4 MG CAPS capsule TAKE 1 CAPSULE BY MOUTH  DAILY 90 capsule 3  . verapamil (CALAN-SR) 240 MG CR tablet TAKE 1 TABLET BY MOUTH AT  BEDTIME 90  tablet 3   No current facility-administered medications for this visit.    REVIEW OF SYSTEMS:   Constitutional: ( - ) fevers, ( - )  chills , ( - ) night sweats Eyes: ( - ) blurriness of vision, ( - ) double vision, ( - ) watery eyes Ears, nose, mouth, throat, and face: ( - ) mucositis, ( - ) sore throat Respiratory: ( - )  cough, ( - ) dyspnea, ( - ) wheezes Cardiovascular: ( - ) palpitation, ( - ) chest discomfort, ( - ) lower extremity swelling Gastrointestinal:  ( - ) nausea, ( - ) heartburn, ( - ) change in bowel habits Skin: ( - ) abnormal skin rashes Lymphatics: ( - ) new lymphadenopathy, ( - ) easy bruising Neurological: ( - ) numbness, ( - ) tingling, ( - ) new weaknesses Behavioral/Psych: ( - ) mood change, ( - ) new changes  All other systems were reviewed with the patient and are negative.  PHYSICAL EXAMINATION: ECOG PERFORMANCE STATUS: 1 - Symptomatic but completely ambulatory  Vitals:   04/30/19 0958  BP: (!) 167/84  Pulse: 60  Resp: 20  Temp: (!) 97.4 F (36.3 C)  SpO2: 100%   Filed Weights   04/30/19 0958  Weight: 268 lb 4.8 oz (121.7 kg)    GENERAL: well appearing African American male in NAD  SKIN: skin color, texture, turgor are normal, no rashes or significant lesions EYES: conjunctiva are pink and non-injected, sclera clear LUNGS: clear to auscultation and percussion with normal breathing effort HEART: regular rate & rhythm and no murmurs and no lower extremity edema ABDOMEN: soft, non-tender, non-distended, normal bowel sounds Musculoskeletal: no cyanosis of digits and no clubbing  PSYCH: alert & oriented x 3, fluent speech NEURO: no focal motor/sensory deficits  LABORATORY DATA:  I have reviewed the data as listed CBC Latest Ref Rng & Units 04/30/2019 01/27/2019 01/22/2019  WBC 4.0 - 10.5 K/uL 2.7(L) 2.6(L) 3.3(L)  Hemoglobin 13.0 - 17.0 g/dL 13.0 12.6(L) 12.6(L)  Hematocrit 39.0 - 52.0 % 37.4(L) 37.0(L) 37.2(L)  Platelets 150 - 400 K/uL 149(L)  121(L) CANCELED   CMP Latest Ref Rng & Units 04/30/2019 04/23/2019 01/27/2019  Glucose 70 - 99 mg/dL 87 88 86  BUN 8 - 23 mg/dL '16 16 14  '$ Creatinine 0.61 - 1.24 mg/dL 1.45(H) 1.47 1.27(H)  Sodium 135 - 145 mmol/L 143 141 145  Potassium 3.5 - 5.1 mmol/L 3.8 3.7 3.3(L)  Chloride 98 - 111 mmol/L 109 106 105  CO2 22 - 32 mmol/L '27 28 31  '$ Calcium 8.9 - 10.3 mg/dL 8.9 9.3 9.0  Total Protein 6.5 - 8.1 g/dL 7.5 - 7.4  Total Bilirubin 0.3 - 1.2 mg/dL 0.7 - 0.6  Alkaline Phos 38 - 126 U/L 53 - 73  AST 15 - 41 U/L 23 - 17  ALT 0 - 44 U/L 23 - 19    RADIOGRAPHIC STUDIES: None to review  ASSESSMENT & PLAN Calvin [name]. 69 y.o. male with medical history significant for MGUS presents for a follow up visit.  Review of Calvin [name] labs appear stable.  On further discussion with the patient he notes that he is at his baseline level of health with no major changes since his last visit.  Of note he did have a Covid infection in December 2020 which was treated in the outpatient setting and only had mild symptoms.  Today he notes that he feels well as he lost some weight during his Covid infection but now has more energy.  He reports that he does not have any shortness of breath, fatigue, or chest pain.  Given the stability of his reported health and the stability of his labs I think we can safely follow-up the patient approximately 6 months time.  At this juncture there is no clear indication for a bone marrow biopsy though I have would have a low threshold to perform  this if any other abnormalities were noted in his blood.  We will plan to see the patient back in approximately 6 months however if he were to have any new symptoms in the interim we recommend that he call our clinic and we can always see him sooner.  #Normocytic Anemia, stable --HB electrophoresis confirms sickle cell trait, though he has had normal baseline Hgb before in the past.  --Calvin [name] was a long time blood donor, donating as  frequently as he could for years. He may have developed low iron stores due to this generosity. He last donated in 2018. His counts appear stable at this time. Iron stores appear replete as of 04/30/2019.  --RTC in 6 months for further review.   #Monoclonal Gammopathy of Undetermined Significance, stable  --last SPEP on 01/27/2019 showed M protein 0.2, kappa 42.4, lambda 20.3, ratio 2.09 --will repeat SPEP and SFLC today.  --patient will require a bone survey at least yearly. Currently due.  --continue to monitor at least q 67month.   #Leukopenia, stable --low WBC dating back to 2011, stably low around 3.5-.5.0.  --05/29/2018: negative for HIV, Hep C, but had Hep B core antibody, no surface antigen or surface antibody.  --continue to monitor   Orders Placed This Encounter  Procedures  . CBC with Differential (Cancer Center Only)    Standing Status:   Future    Standing Expiration Date:   04/29/2020  . CMP ([place]    Standing Status:   Future    Standing Expiration Date:   04/29/2020  . Lactate dehydrogenase (LDH)    Standing Status:   Future    Standing Expiration Date:   04/29/2020  . Save Smear ([facility])    Standing Status:   Future    Standing Expiration Date:   04/29/2020  . Iron and TIBC    Standing Status:   Future    Standing Expiration Date:   04/29/2020  . Ferritin    Standing Status:   Future    Standing Expiration Date:   04/29/2020  . Multiple Myeloma Panel (SPEP&IFE w/QIG)    Standing Status:   Future    Standing Expiration Date:   04/29/2020  . Kappa/lambda light chains    Standing Status:   Future    Standing Expiration Date:   04/29/2020    All questions were answered. The patient knows to call the clinic with any problems, questions or concerns.  A total of more than 25 minutes were spent face-to-face with the patient during this encounter and over half of that time was spent on counseling and coordination of care as outlined above.   JLedell Peoples  MD Department of Hematology/Oncology [facility]at [facility] TreatmentPhone: [phone]: [phone]: [email or web address]  04/30/2019 10:41 AM

## 2019-05-01 LAB — KAPPA/LAMBDA LIGHT CHAINS
Kappa free light chain: 45.2 mg/L — ABNORMAL HIGH (ref 3.3–19.4)
Kappa, lambda light chain ratio: 2.11 — ABNORMAL HIGH (ref 0.26–1.65)
Lambda free light chains: 21.4 mg/L (ref 5.7–26.3)

## 2019-05-02 LAB — MULTIPLE MYELOMA PANEL, SERUM
Albumin SerPl Elph-Mcnc: 3.9 g/dL (ref 2.9–4.4)
Albumin/Glob SerPl: 1.3 (ref 0.7–1.7)
Alpha 1: 0.2 g/dL (ref 0.0–0.4)
Alpha2 Glob SerPl Elph-Mcnc: 0.5 g/dL (ref 0.4–1.0)
B-Globulin SerPl Elph-Mcnc: 1 g/dL (ref 0.7–1.3)
Gamma Glob SerPl Elph-Mcnc: 1.3 g/dL (ref 0.4–1.8)
Globulin, Total: 3.1 g/dL (ref 2.2–3.9)
IgA: 168 mg/dL (ref 61–437)
IgG (Immunoglobin G), Serum: 1249 mg/dL (ref 603–1613)
IgM (Immunoglobulin M), Srm: 110 mg/dL (ref 20–172)
M Protein SerPl Elph-Mcnc: 0.2 g/dL — ABNORMAL HIGH
Total Protein ELP: 7 g/dL (ref 6.0–8.5)

## 2019-05-03 ENCOUNTER — Encounter: Payer: Self-pay | Admitting: Hematology and Oncology

## 2019-05-07 ENCOUNTER — Telehealth: Payer: Self-pay | Admitting: *Deleted

## 2019-05-07 NOTE — Telephone Encounter (Signed)
LM with note below 

## 2019-05-07 NOTE — Telephone Encounter (Signed)
-----   Message from Otila Kluver, RN sent at 05/07/2019  3:19 PM EST -----  ----- Message ----- From: Orson Slick, MD Sent: 05/04/2019  12:28 PM EST To: Otila Kluver, RN  Please call Mr. Parkhouse to let him know his bloodwork is stable. His MGUS protein is the same as it was last time and his Hgb is now in the normal range. His WBC are just about the same.  We will plan to see him back in 6 months to recheck (with labs 1 week prior).  Colan Neptune  ----- Message ----- From: Buel Ream, [facility] In [place] Sent: 04/30/2019  10:57 AM EST To: Orson Slick, MD

## 2019-06-05 ENCOUNTER — Telehealth: Payer: Self-pay | Admitting: Family Medicine

## 2019-06-05 NOTE — Telephone Encounter (Signed)
Pt called, has not been seen in awhile and has no new issues. He has started exercising and it aggravates his knee. Wonders if he could have some Pennsaid samples, as he used it successfully in the past.  He is agreeable to a visit if needed, but feels like the knee is doing well, just aggravated with exercise.

## 2019-06-05 NOTE — Telephone Encounter (Signed)
Spoke with patient recommending Voltaren gel as we do not have any samples. Patient will try Voltaren gel instead and call back if it is not working to alleviate his pain.

## 2019-06-11 DIAGNOSIS — G4733 Obstructive sleep apnea (adult) (pediatric): Secondary | ICD-10-CM | POA: Diagnosis not present

## 2019-06-12 ENCOUNTER — Encounter: Payer: Self-pay | Admitting: Nurse Practitioner

## 2019-06-16 ENCOUNTER — Other Ambulatory Visit: Payer: Self-pay | Admitting: Internal Medicine

## 2019-06-16 ENCOUNTER — Other Ambulatory Visit: Payer: Self-pay | Admitting: Family Medicine

## 2019-06-16 NOTE — Telephone Encounter (Signed)
Please refill as per office routine med refill policy (all routine meds refilled for 3 mo or monthly per pt preference up to one year from last visit, then month to month grace period for 3 mo, then further med refills will have to be denied)  

## 2019-06-23 ENCOUNTER — Other Ambulatory Visit: Payer: Self-pay

## 2019-06-24 ENCOUNTER — Ambulatory Visit (INDEPENDENT_AMBULATORY_CARE_PROVIDER_SITE_OTHER): Payer: Medicare Other

## 2019-06-24 DIAGNOSIS — Z23 Encounter for immunization: Secondary | ICD-10-CM

## 2019-06-24 NOTE — Progress Notes (Signed)
Medical screening examination/treatment/procedure(s) were performed by the CMA. As primary care provider I was immediately available for consulation/collaboration. I agree with above documentation. Korie Brabson, AGNP-C 

## 2019-06-24 NOTE — Progress Notes (Signed)
After obtaining consent, and per orders of Wilfred Lacy, NP, injection of Shingrix given in left deltoid by Ryland Smoots Berneta Sages. Patient instructed to remain in clinic for 20 minutes afterwards, and to report any adverse reaction to me immediately.

## 2019-07-11 ENCOUNTER — Encounter: Payer: Self-pay | Admitting: Nurse Practitioner

## 2019-07-11 ENCOUNTER — Other Ambulatory Visit: Payer: Self-pay | Admitting: Internal Medicine

## 2019-07-11 DIAGNOSIS — M1A0620 Idiopathic chronic gout, left knee, without tophus (tophi): Secondary | ICD-10-CM

## 2019-07-11 DIAGNOSIS — M1A2620 Drug-induced chronic gout, left knee, without tophus (tophi): Secondary | ICD-10-CM

## 2019-07-11 MED ORDER — COLCHICINE 0.6 MG PO TABS: 0.3000 mg | ORAL_TABLET | Freq: Every day | ORAL | 0 refills | Status: DC | PRN

## 2019-07-11 NOTE — Telephone Encounter (Signed)
Pt no longer see Dr. Jenny Reichmann. PCP is Wilfred Lacy, NP forwarding to [place] for approval../lmb

## 2019-07-24 ENCOUNTER — Telehealth: Payer: Medicare Other | Admitting: Nurse Practitioner

## 2019-07-29 ENCOUNTER — Other Ambulatory Visit: Payer: Self-pay

## 2019-07-29 ENCOUNTER — Encounter: Payer: Self-pay | Admitting: Nurse Practitioner

## 2019-07-29 ENCOUNTER — Ambulatory Visit (INDEPENDENT_AMBULATORY_CARE_PROVIDER_SITE_OTHER): Payer: Medicare Other | Admitting: Nurse Practitioner

## 2019-07-29 VITALS — BP 138/82 | HR 66 | Temp 96.1°F | Ht 67.5 in | Wt 271.4 lb

## 2019-07-29 DIAGNOSIS — M1A0620 Idiopathic chronic gout, left knee, without tophus (tophi): Secondary | ICD-10-CM

## 2019-07-29 DIAGNOSIS — I10 Essential (primary) hypertension: Secondary | ICD-10-CM

## 2019-07-29 DIAGNOSIS — N401 Enlarged prostate with lower urinary tract symptoms: Secondary | ICD-10-CM | POA: Diagnosis not present

## 2019-07-29 DIAGNOSIS — R350 Frequency of micturition: Secondary | ICD-10-CM

## 2019-07-29 DIAGNOSIS — E782 Mixed hyperlipidemia: Secondary | ICD-10-CM | POA: Diagnosis not present

## 2019-07-29 MED ORDER — ALLOPURINOL 300 MG PO TABS: 300.0000 mg | ORAL_TABLET | Freq: Every day | ORAL | 3 refills | Status: DC

## 2019-07-29 MED ORDER — PRAVASTATIN SODIUM 20 MG PO TABS: 20.0000 mg | ORAL_TABLET | Freq: Every day | ORAL | 3 refills | Status: DC

## 2019-07-29 MED ORDER — FENOFIBRATE 54 MG PO TABS: 54.0000 mg | ORAL_TABLET | Freq: Every day | ORAL | 3 refills | Status: DC

## 2019-07-29 MED ORDER — TAMSULOSIN HCL 0.4 MG PO CAPS: 0.4000 mg | ORAL_CAPSULE | Freq: Every day | ORAL | 3 refills | Status: DC

## 2019-07-29 NOTE — Patient Instructions (Signed)
Use colchicine as needed Ok to draw labs 2-3days prior to next OV (will need to be fasting 6hrs prior)

## 2019-07-29 NOTE — Progress Notes (Signed)
Subjective:  Patient ID: Calvin Broad., male    DOB: 1950/10/04  Age: 69 y.o. MRN: [id number]  CC: Follow-up (HTN-pt doesn't check BP at home but feels good an like it has been ok)  HPI HTN: BP at goal BP Readings from Last 3 Encounters:  07/29/19 138/82  04/30/19 (!) 167/84  04/23/19 128/78   Gout: Use of allopurinol and colchicine No acute gout exacerbation in over 1year,  Reviewed past Medical, Social and Family history today.  Outpatient Medications Prior to Visit  Medication Sig Dispense Refill  . aspirin 81 MG tablet Take 81 mg by mouth daily.      . Blood Pressure Monitoring (B-D ASSURE BPM/AUTO ARM CUFF) MISC 1 Units by Does not apply route daily. Arm circumference: 40cm 1 each 0  . colchicine 0.6 MG tablet Take 0.5 tablets (0.3 mg total) by mouth daily as needed. 45 tablet 0  . finasteride (PROSCAR) 5 MG tablet TAKE 1 TABLET BY MOUTH  DAILY 90 tablet 3  . irbesartan (AVAPRO) 300 MG tablet Take 1 tablet (300 mg total) by mouth daily. 90 tablet 3  . loratadine (CLARITIN) 10 MG tablet Take 10 mg by mouth daily as needed for allergies.    . nabumetone (RELAFEN) 500 MG tablet TAKE 1 TABLET BY MOUTH  DAILY 90 tablet 3  . pantoprazole (PROTONIX) 40 MG tablet Take 1 tablet (40 mg total) by mouth daily. 90 tablet 3  . sildenafil (VIAGRA) 100 MG tablet Take 1 tablet (100 mg total) by mouth as needed for erectile dysfunction. Yearly physical w/labs due in February must see MD for refills 10 tablet 0  . verapamil (CALAN-SR) 240 MG CR tablet TAKE 1 TABLET BY MOUTH AT  BEDTIME 90 tablet 3  . allopurinol (ZYLOPRIM) 300 MG tablet TAKE 1 TABLET BY MOUTH  DAILY 90 tablet 1  . fenofibrate 54 MG tablet Take 1 tablet (54 mg total) by mouth daily. 90 tablet 1  . pravastatin (PRAVACHOL) 20 MG tablet Take 1 tablet (20 mg total) by mouth daily. 90 tablet 1  . tamsulosin (FLOMAX) 0.4 MG CAPS capsule TAKE 1 CAPSULE BY MOUTH  DAILY 90 capsule 3   No facility-administered medications prior to  visit.    ROS See HPI  Objective:  BP 138/82   Pulse 66   Temp (!) 96.1 F (35.6 C) (Tympanic)   Ht 5' 7.5" (1.715 m)   Wt 271 lb 6.4 oz (123.1 kg)   SpO2 98%   BMI 41.88 kg/m   BP Readings from Last 3 Encounters:  07/29/19 138/82  04/30/19 (!) 167/84  04/23/19 128/78    Wt Readings from Last 3 Encounters:  07/29/19 271 lb 6.4 oz (123.1 kg)  04/30/19 268 lb 4.8 oz (121.7 kg)  04/23/19 266 lb 3.2 oz (120.7 kg)    Physical Exam Constitutional:      Appearance: He is obese.  Cardiovascular:     Rate and Rhythm: Normal rate and regular rhythm.     Pulses: Normal pulses.     Heart sounds: Normal heart sounds.  Musculoskeletal:     Right lower leg: No edema.     Left lower leg: No edema.  Neurological:     Mental Status: He is alert and oriented to person, place, and time.    Lab Results  Component Value Date   WBC 2.7 (L) 04/30/2019   HGB 13.0 04/30/2019   HCT 37.4 (L) 04/30/2019   PLT 149 (L) 04/30/2019   GLUCOSE  87 04/30/2019   CHOL 174 04/23/2019   TRIG 143.0 04/23/2019   HDL 22.90 (L) 04/23/2019   LDLDIRECT 61.0 11/26/2018   LDLCALC 123 (H) 04/23/2019   ALT 23 04/30/2019   AST 23 04/30/2019   NA 143 04/30/2019   K 3.8 04/30/2019   CL 109 04/30/2019   CREATININE 1.45 (H) 04/30/2019   BUN 16 04/30/2019   CO2 27 04/30/2019   TSH 1.338 01/27/2019   PSA 2.17 05/09/2018   INR 1.0 05/29/2018   HGBA1C 5.1 04/23/2019   MICROALBUR 5.9 01/22/2019    No results found.  Assessment & Plan:  This visit occurred during the SARS-CoV-2 public health emergency.  Safety protocols were in place, including screening questions prior to the visit, additional usage of staff PPE, and extensive cleaning of exam room while observing appropriate contact time as indicated for disinfecting solutions.   Calvin [name] was seen today for follow-up.  Diagnoses and all orders for this visit:  Essential hypertension  Mixed hyperlipidemia -     Discontinue: fenofibrate 54 MG  tablet; Take 1 tablet (54 mg total) by mouth daily. -     pravastatin (PRAVACHOL) 20 MG tablet; Take 1 tablet (20 mg total) by mouth daily. -     fenofibrate 54 MG tablet; Take 1 tablet (54 mg total) by mouth daily.  Chronic gout of left knee, unspecified cause -     Discontinue: allopurinol (ZYLOPRIM) 300 MG tablet; Take 1 tablet (300 mg total) by mouth daily. -     allopurinol (ZYLOPRIM) 300 MG tablet; Take 1 tablet (300 mg total) by mouth daily.  Benign prostatic hyperplasia with urinary frequency -     Discontinue: tamsulosin (FLOMAX) 0.4 MG CAPS capsule; Take 1 capsule (0.4 mg total) by mouth daily. -     tamsulosin (FLOMAX) 0.4 MG CAPS capsule; Take 1 capsule (0.4 mg total) by mouth daily.   I am having Calvin Broad. maintain his aspirin, sildenafil, loratadine, pantoprazole, verapamil, B-D ASSURE BPM/AUTO ARM CUFF, irbesartan, nabumetone, finasteride, colchicine, pravastatin, tamsulosin, fenofibrate, and allopurinol.  Meds ordered this encounter  Medications  . DISCONTD: allopurinol (ZYLOPRIM) 300 MG tablet    Sig: Take 1 tablet (300 mg total) by mouth daily.    Dispense:  90 tablet    Refill:  3    Requesting 1 year supply    Order Specific Question:   Supervising Provider    Answer:   Ronnald Nian [id number]  . DISCONTD: fenofibrate 54 MG tablet    Sig: Take 1 tablet (54 mg total) by mouth daily.    Dispense:  90 tablet    Refill:  3    Change in dose    Order Specific Question:   Supervising Provider    Answer:   Ronnald Nian [id number]  . DISCONTD: tamsulosin (FLOMAX) 0.4 MG CAPS capsule    Sig: Take 1 capsule (0.4 mg total) by mouth daily.    Dispense:  90 capsule    Refill:  3    Requesting 1 year supply    Order Specific Question:   Supervising Provider    Answer:   Ronnald Nian [id number]  . pravastatin (PRAVACHOL) 20 MG tablet    Sig: Take 1 tablet (20 mg total) by mouth daily.    Dispense:  90 tablet    Refill:  3    Change in dose     Order Specific Question:   Supervising Provider    Answer:   Letta Median  K [id number]  . tamsulosin (FLOMAX) 0.4 MG CAPS capsule    Sig: Take 1 capsule (0.4 mg total) by mouth daily.    Dispense:  90 capsule    Refill:  3    Requesting 1 year supply    Order Specific Question:   Supervising Provider    Answer:   Ronnald Nian [id number]  . fenofibrate 54 MG tablet    Sig: Take 1 tablet (54 mg total) by mouth daily.    Dispense:  90 tablet    Refill:  3    Change in dose    Order Specific Question:   Supervising Provider    Answer:   Ronnald Nian [id number]  . allopurinol (ZYLOPRIM) 300 MG tablet    Sig: Take 1 tablet (300 mg total) by mouth daily.    Dispense:  90 tablet    Refill:  3    Requesting 1 year supply    Order Specific Question:   Supervising Provider    Answer:   Ronnald Nian [id number]    Problem List Items Addressed This Visit      Cardiovascular and Mediastinum   Essential hypertension - Primary   Relevant Medications   pravastatin (PRAVACHOL) 20 MG tablet   fenofibrate 54 MG tablet     Other   GOUT   Relevant Medications   allopurinol (ZYLOPRIM) 300 MG tablet    Other Visit Diagnoses    Mixed hyperlipidemia       Relevant Medications   pravastatin (PRAVACHOL) 20 MG tablet   fenofibrate 54 MG tablet   Benign prostatic hyperplasia with urinary frequency       Relevant Medications   tamsulosin (FLOMAX) 0.4 MG CAPS capsule       Follow-up: Return in about 6 months (around 01/29/2020) for HTN and , hyperlipidemia (F2F, 25mins).  Wilfred Lacy, NP

## 2019-08-05 ENCOUNTER — Encounter: Payer: Self-pay | Admitting: Hematology and Oncology

## 2019-08-07 ENCOUNTER — Telehealth: Payer: Self-pay | Admitting: Hematology and Oncology

## 2019-08-07 NOTE — Telephone Encounter (Signed)
Scheduled per 5/26 sch message. Mailing calendar to pt.

## 2019-08-15 ENCOUNTER — Encounter: Payer: Self-pay | Admitting: Nurse Practitioner

## 2019-09-09 DIAGNOSIS — G4733 Obstructive sleep apnea (adult) (pediatric): Secondary | ICD-10-CM | POA: Diagnosis not present

## 2019-09-11 ENCOUNTER — Ambulatory Visit: Payer: Medicare Other

## 2019-09-17 ENCOUNTER — Ambulatory Visit: Payer: Medicare Other | Admitting: *Deleted

## 2019-09-23 ENCOUNTER — Other Ambulatory Visit: Payer: Self-pay

## 2019-09-23 NOTE — Progress Notes (Signed)
Subjective:   Calvin [name]. is a 69 y.o. male who presents for an Initial Medicare Annual Wellness Visit.  Review of Systems   Cardiac Risk Factors include: advanced age (>59men, >62 women);hypertension;obesity (BMI >30kg/m2);male gender    Objective:    Today's Vitals   09/24/19 1023 09/24/19 1025  BP: 140/78   Pulse: (!) 59   Resp: 16   Temp: 97.8 F (36.6 C)   TempSrc: Temporal   SpO2: 99%   Weight: 273 lb 12.8 oz (124.2 kg)   Height: 5\' 7"  (1.702 m)   PainSc:  1    Body mass index is 42.88 kg/m.  Advanced Directives 09/24/2019 09/10/2018 05/29/2018 09/07/2017 09/06/2016 04/13/2016 04/03/2016  Does Patient Have a Medical Advance Directive? Yes Yes Yes Yes Yes Yes Yes  Type of Paramedic of [place];Living will [place];Living will [place];Living will Healthcare Power of [place];Living will [place];Living will [place];Living will  Copy of [place] in Chart? No - copy requested No - copy requested No - copy requested - No - copy requested - -    Current Medications (verified) Outpatient Encounter Medications as of 09/24/2019  Medication Sig  . allopurinol (ZYLOPRIM) 300 MG tablet Take 1 tablet (300 mg total) by mouth daily.  Marland Kitchen aspirin 81 MG tablet Take 81 mg by mouth daily.    . Blood Pressure Monitoring (B-D ASSURE BPM/AUTO ARM CUFF) MISC 1 Units by Does not apply route daily. Arm circumference: 40cm  . fenofibrate 54 MG tablet Take 1 tablet (54 mg total) by mouth daily.  . finasteride (PROSCAR) 5 MG tablet TAKE 1 TABLET BY MOUTH  DAILY  . irbesartan (AVAPRO) 300 MG tablet Take 1 tablet (300 mg total) by mouth daily.  . nabumetone (RELAFEN) 500 MG tablet TAKE 1 TABLET BY MOUTH  DAILY  . pantoprazole (PROTONIX) 40 MG tablet Take 1 tablet (40 mg total) by mouth daily.  . pravastatin (PRAVACHOL) 20 MG tablet Take 1  tablet (20 mg total) by mouth daily.  . sildenafil (VIAGRA) 100 MG tablet Take 1 tablet (100 mg total) by mouth as needed for erectile dysfunction. Yearly physical w/labs due in February must see MD for refills  . tamsulosin (FLOMAX) 0.4 MG CAPS capsule Take 1 capsule (0.4 mg total) by mouth daily.  . verapamil (CALAN-SR) 240 MG CR tablet TAKE 1 TABLET BY MOUTH AT  BEDTIME  . colchicine 0.6 MG tablet Take 0.5 tablets (0.3 mg total) by mouth daily as needed. (Patient not taking: Reported on 09/24/2019)  . loratadine (CLARITIN) 10 MG tablet Take 10 mg by mouth daily as needed for allergies. (Patient not taking: Reported on 09/24/2019)   No facility-administered encounter medications on file as of 09/24/2019.    Allergies (verified) Patient has no known allergies.   History: Past Medical History:  Diagnosis Date  . Allergy   . Arthritis   . Diverticulosis   . Gout   . Hyperlipidemia   . Hypertension   . Iron deficiency anemia 09/20/2018  . OSA (obstructive sleep apnea) 11/13/2013  . Sickle cell trait ([place])   . Sleep apnea    Past Surgical History:  Procedure Laterality Date  . COLONOSCOPY    . POLYPECTOMY    . ROTATOR CUFF REPAIR     left  . THROAT SURGERY     polyps removed from vocal cord   Family History  Problem  Relation Age of Onset  . Colon cancer Neg Hx   . Esophageal cancer Neg Hx   . Rectal cancer Neg Hx   . Stomach cancer Neg Hx    Social History   Socioeconomic History  . Marital status: Married    Spouse name: Not on file  . Number of children: 1  . Years of education: Not on file  . Highest education level: Not on file  Occupational History  . Occupation: retired  Tobacco Use  . Smoking status: Former Smoker    Packs/day: 2.00    Years: 5.00    Pack years: 10.00    Types: Cigarettes    Quit date: 05/23/1977    Years since quitting: 42.3  . Smokeless tobacco: Never Used  Vaping Use  . Vaping Use: Never used  Substance and Sexual Activity  . Alcohol  use: No    Comment: QUIT 2002  . Drug use: No    Comment: Completed program in april 2000  . Sexual activity: Yes  Other Topics Concern  . Not on file  Social History Narrative  . Not on file   Social Determinants of Health   Financial Resource Strain: Low Risk   . Difficulty of Paying Living Expenses: Not hard at all  Food Insecurity: No Food Insecurity  . Worried About Charity fundraiser in the Last Year: Never true  . Ran Out of Food in the Last Year: Never true  Transportation Needs: No Transportation Needs  . Lack of Transportation (Medical): No  . Lack of Transportation (Non-Medical): No  Physical Activity: Sufficiently Active  . Days of Exercise per Week: 3 days  . Minutes of Exercise per Session: 60 min  Stress: No Stress Concern Present  . Feeling of Stress : Not at all  Social Connections: Socially Integrated  . Frequency of Communication with Friends and Family: More than three times a week  . Frequency of Social Gatherings with Friends and Family: Twice a week  . Attends Religious Services: More than 4 times per year  . Active Member of Clubs or Organizations: Yes  . Attends Archivist Meetings: More than 4 times per year  . Marital Status: Married   Tobacco Counseling Counseling given: Not Answered   Clinical Intake:  Pre-visit preparation completed: Yes  Pain : 0-10 Pain Score: 1  Pain Type: Chronic pain Pain Location: Heel Pain Orientation: Left Pain Onset: More than a month ago Pain Frequency: Intermittent     Nutritional Status: BMI > 30  Obese Nutritional Risks: None Diabetes: No  How often do you need to have someone help you when you read instructions, pamphlets, or other written materials from your doctor or pharmacy?: 1 - Never What is the last grade level you completed in school?: Bachelor's degree  Interpreter Needed?: No  Information entered by :: Noelle Penner LPN  Activities of Daily Living In your present state of  health, do you have any difficulty performing the following activities: 09/24/2019  Hearing? N  Vision? N  Difficulty concentrating or making decisions? N  Walking or climbing stairs? N  Dressing or bathing? N  Doing errands, shopping? N  Preparing Food and eating ? N  Using the Toilet? N  In the past six months, have you accidently leaked urine? N  Do you have problems with loss of bowel control? N  Managing your Medications? N  Managing your Finances? N  Housekeeping or managing your Housekeeping? N  Some recent data  might be hidden     Immunizations and Health Maintenance Immunization History  Administered Date(s) Administered  . Fluad Quad(high Dose 65+) 11/26/2018  . H1N1 03/30/2008  . Influenza Split 01/18/2012, 11/11/2013  . Influenza Whole 01/14/2007, 01/11/2009  . Influenza, High Dose Seasonal PF 01/03/2016  . Influenza-Unspecified 02/02/2015, 01/04/2017, 01/02/2018  . PFIZER SARS-COV-2 Vaccination 04/19/2019, 05/12/2019  . Pneumococcal Conjugate-13 08/27/2013  . Pneumococcal Polysaccharide-23 12/10/2013  . Td 10/19/2007  . Tdap 05/09/2018  . Zoster 04/01/2009  . Zoster Recombinat (Shingrix) 01/22/2019, 06/24/2019   There are no preventive care reminders to display for this patient.  Patient Care Team: Nche, Charlene Brooke, NP as PCP - General (Internal Medicine) Franchot Gallo, MD as Consulting Physician (Urology) Netta Cedars, MD as Consulting Physician (Orthopedic Surgery)  Indicate any recent Medical Services you may have received from other than [facility] providers in the past year (date may be approximate).    Assessment:   This is a routine wellness examination for Calvin [name].  Hearing/Vision screen  Hearing Screening   125Hz  250Hz  500Hz  1000Hz  2000Hz  3000Hz  4000Hz  6000Hz  8000Hz   Right ear:           Left ear:           Comments: No issues  Vision Screening Comments: Reading glasses Last eye exam-2019  Dietary issues and exercise activities  discussed: Current Exercise Habits: Home exercise routine, Type of exercise: strength training/weights;Other - see comments (eliptical), Time (Minutes): 60, Frequency (Times/Week): 3, Weekly Exercise (Minutes/Week): 180  Goals Addressed            This Visit's Progress   . Start to exercise on a regular basis and lose weight   On track    I will start to go the gym 3 times per week, reduce dessert intake to 3 times a week.      Depression Screen PHQ 2/9 Scores 09/24/2019 01/22/2019 09/10/2018 05/09/2018  PHQ - 2 Score 0 0 0 0    Fall Risk Fall Risk  09/24/2019 01/22/2019 09/10/2018 09/07/2017 05/08/2017  Falls in the past year? 0 0 0 No No  Number falls in past yr: 0 - 0 - -  Injury with Fall? 0 - - - -  Comment - - - - -  Follow up Falls prevention discussed - - - -    FALL RISK PREVENTION PERTAINING TO THE HOME:  Any stairs in or around the home? Yes  If so, are there any without handrails? No   Home free of loose throw rugs in walkways, pet beds, electrical cords, etc? Yes  Adequate lighting in your home to reduce risk of falls? Yes   ASSISTIVE DEVICES UTILIZED TO PREVENT FALLS:  Life alert? No  Use of a cane, walker or w/c? No  Grab bars in the bathroom? No  Shower chair or bench in shower? No  Elevated toilet seat or a handicapped toilet? No    TIMED UP AND GO:  Was the test performed? Yes .  Length of time to ambulate 10 feet: 9 sec.   GAIT:  Appearance of gait: Gait steady and fast /without the use of an assistive device.  Education: Fall risk prevention has been discussed.  Intervention(s) required? No   DME/home health order needed?  No    Cognitive Function: No cognitive impairment noted  MMSE - Mini Mental State Exam 09/07/2017  Orientation to time 5  Orientation to Place 5  Registration 3  Attention/ Calculation 5  Recall 3  Language- name 2  objects 2  Language- repeat 1  Language- follow 3 step command 3  Language- read & follow direction 1   Write a sentence 1  Copy design 1  Total score 30        Screening Tests Health Maintenance  Topic Date Due  . INFLUENZA VACCINE  10/12/2019  . TETANUS/TDAP  05/09/2028  . COLONOSCOPY  11/18/2028  . COVID-19 Vaccine  Completed  . Hepatitis C Screening  Completed  . PNA vac Low Risk Adult  Completed    Qualifies for Shingles Vaccine? Up to Date  Tdap: Up to Date  Flu Vaccine: Due f9/2021  Pneumococcal Vaccine: Up to Date  Covid-19 Vaccine:Completed vaccines  Cancer Screenings:  Colorectal Screening: Completed 11/19/2018. Repeat every 10 years.  Lung Cancer Screening: (Low Dose CT Chest recommended if Age 35-80 years, 30 pack-year currently smoking OR have quit w/in 15years.) does not qualify.    Additional Screening:  Hepatitis C Screening:  Completed 05/29/2018  Vision Screening: Recommended annual ophthalmology exams for early detection of glaucoma and other disorders of the eye. Is the patient up to date with their annual eye exam?  Yes  Who is the provider or what is the name of the office in which the pt attends annual eye exams? Unsure of name   Dental Screening: Recommended annual dental exams for proper oral hygiene  Community Resource Referral:  CRR required this visit?  No        Plan:  I have personally reviewed and addressed the Medicare Annual Wellness questionnaire and have noted the following in the patient's chart:  A. Medical and social history B. Use of alcohol, tobacco or illicit drugs  C. Current medications and supplements D. Functional ability and status E.  Nutritional status F.  Physical activity G. Advance directives H. List of other physicians I.  Hospitalizations, surgeries, and ER visits in previous 12 months J.  [place] such as hearing and vision if needed, cognitive and depression L. Referrals and appointments   In addition, I have reviewed and discussed with patient certain preventive protocols, quality  metrics, and best practice recommendations. A written personalized care plan for preventive services as well as general preventive health recommendations were provided to patient.   Patient to access AVS via mychart.  Signed,    Marta Antu, LPN   [id number]  Nurse Health Advisor   Nurse Notes: None

## 2019-09-24 ENCOUNTER — Ambulatory Visit (INDEPENDENT_AMBULATORY_CARE_PROVIDER_SITE_OTHER): Payer: Medicare Other

## 2019-09-24 ENCOUNTER — Ambulatory Visit: Payer: Medicare Other | Attending: Internal Medicine

## 2019-09-24 VITALS — BP 140/78 | HR 59 | Temp 97.8°F | Resp 16 | Ht 67.0 in | Wt 273.8 lb

## 2019-09-24 DIAGNOSIS — Z20822 Contact with and (suspected) exposure to covid-19: Secondary | ICD-10-CM

## 2019-09-24 DIAGNOSIS — Z0000 Encounter for general adult medical examination without abnormal findings: Secondary | ICD-10-CM

## 2019-09-24 NOTE — Patient Instructions (Signed)
[name] , Thank you for taking time to come for your Medicare Wellness Visit. I appreciate your ongoing commitment to your health goals. Please review the following plan we discussed and let me know if I can assist you in the future.   Screening recommendations/referrals: Colonoscopy: Completed 11/19/2018- Due 11/18/2028 Recommended yearly ophthalmology/optometry visit for glaucoma screening and checkup Recommended yearly dental visit for hygiene and checkup  Vaccinations: Influenza vaccine: Up to date-Due 11/2019 Pneumococcal vaccine: Completed vaccines Tdap vaccine: Up to date-Due 05/09/2028 Shingles vaccine: Completed vaccines   Covid-19: Completed vaccines  Advanced directives: Bring a copy to your next office visit  Conditions/risks identified: See problem list  Next appointment: Follow up in one year for your annual wellness visit.   Preventive Care 71 Years and Older, Male Preventive care refers to lifestyle choices and visits with your health care provider that can promote health and wellness. What does preventive care include?  A yearly physical exam. This is also called an annual well check.  Dental exams once or twice a year.  Routine eye exams. Ask your health care provider how often you should have your eyes checked.  Personal lifestyle choices, including:  Daily care of your teeth and gums.  Regular physical activity.  Eating a healthy diet.  Avoiding tobacco and drug use.  Limiting alcohol use.  Practicing safe sex.  Taking low doses of aspirin every day.  Taking vitamin and mineral supplements as recommended by your health care provider. What happens during an annual well check? The services and screenings done by your health care provider during your annual well check will depend on your age, overall health, lifestyle risk factors, and family history of disease. Counseling  Your health care provider may ask you questions about your:  Alcohol  use.  Tobacco use.  Drug use.  Emotional well-being.  Home and relationship well-being.  Sexual activity.  Eating habits.  History of falls.  Memory and ability to understand (cognition).  Work and work Statistician. Screening  You may have the following tests or measurements:  Height, weight, and BMI.  Blood pressure.  Lipid and cholesterol levels. These may be checked every 5 years, or more frequently if you are over 37 years old.  Skin check.  Lung cancer screening. You may have this screening every year starting at age 50 if you have a 30-pack-year history of smoking and currently smoke or have quit within the past 15 years.  Fecal occult blood test (FOBT) of the stool. You may have this test every year starting at age 72.  Flexible sigmoidoscopy or colonoscopy. You may have a sigmoidoscopy every 5 years or a colonoscopy every 10 years starting at age 36.  Prostate cancer screening. Recommendations will vary depending on your family history and other risks.  Hepatitis C blood test.  Hepatitis B blood test.  Sexually transmitted disease (STD) testing.  Diabetes screening. This is done by checking your blood sugar (glucose) after you have not eaten for a while (fasting). You may have this done every 1-3 years.  Abdominal aortic aneurysm (AAA) screening. You may need this if you are a current or former smoker.  Osteoporosis. You may be screened starting at age 55 if you are at high risk. Talk with your health care provider about your test results, treatment options, and if necessary, the need for more tests. Vaccines  Your health care provider may recommend certain vaccines, such as:  Influenza vaccine. This is recommended every year.  Tetanus, diphtheria,  and acellular pertussis (Tdap, Td) vaccine. You may need a Td booster every 10 years.  Zoster vaccine. You may need this after age 34.  Pneumococcal 13-valent conjugate (PCV13) vaccine. One dose is  recommended after age 61.  Pneumococcal polysaccharide (PPSV23) vaccine. One dose is recommended after age 37. Talk to your health care provider about which screenings and vaccines you need and how often you need them. This information is not intended to replace advice given to you by your health care provider. Make sure you discuss any questions you have with your health care provider. Document Released: 03/26/2015 Document Revised: 11/17/2015 Document Reviewed: 12/29/2014 Elsevier Interactive Patient Education  2017 [place] Prevention in the Home Falls can cause injuries. They can happen to people of all ages. There are many things you can do to make your home safe and to help prevent falls. What can I do on the outside of my home?  Regularly fix the edges of walkways and driveways and fix any cracks.  Remove anything that might make you trip as you walk through a door, such as a raised step or threshold.  Trim any bushes or trees on the path to your home.  Use bright outdoor lighting.  Clear any walking paths of anything that might make someone trip, such as rocks or tools.  Regularly check to see if handrails are loose or broken. Make sure that both sides of any steps have handrails.  Any raised decks and porches should have guardrails on the edges.  Have any leaves, snow, or ice cleared regularly.  Use sand or salt on walking paths during winter.  Clean up any spills in your garage right away. This includes oil or grease spills. What can I do in the bathroom?  Use night lights.  Install grab bars by the toilet and in the tub and shower. Do not use towel bars as grab bars.  Use non-skid mats or decals in the tub or shower.  If you need to sit down in the shower, use a plastic, non-slip stool.  Keep the floor dry. Clean up any water that spills on the floor as soon as it happens.  Remove soap buildup in the tub or shower regularly.  Attach bath mats  securely with double-sided non-slip rug tape.  Do not have throw rugs and other things on the floor that can make you trip. What can I do in the bedroom?  Use night lights.  Make sure that you have a light by your bed that is easy to reach.  Do not use any sheets or blankets that are too big for your bed. They should not hang down onto the floor.  Have a firm chair that has side arms. You can use this for support while you get dressed.  Do not have throw rugs and other things on the floor that can make you trip. What can I do in the kitchen?  Clean up any spills right away.  Avoid walking on wet floors.  Keep items that you use a lot in easy-to-reach places.  If you need to reach something above you, use a strong step stool that has a grab bar.  Keep electrical cords out of the way.  Do not use floor polish or wax that makes floors slippery. If you must use wax, use non-skid floor wax.  Do not have throw rugs and other things on the floor that can make you trip. What can I do with my  stairs?  Do not leave any items on the stairs.  Make sure that there are handrails on both sides of the stairs and use them. Fix handrails that are broken or loose. Make sure that handrails are as long as the stairways.  Check any carpeting to make sure that it is firmly attached to the stairs. Fix any carpet that is loose or worn.  Avoid having throw rugs at the top or bottom of the stairs. If you do have throw rugs, attach them to the floor with carpet tape.  Make sure that you have a light switch at the top of the stairs and the bottom of the stairs. If you do not have them, ask someone to add them for you. What else can I do to help prevent falls?  Wear shoes that:  Do not have high heels.  Have rubber bottoms.  Are comfortable and fit you well.  Are closed at the toe. Do not wear sandals.  If you use a stepladder:  Make sure that it is fully opened. Do not climb a closed  stepladder.  Make sure that both sides of the stepladder are locked into place.  Ask someone to hold it for you, if possible.  Clearly mark and make sure that you can see:  Any grab bars or handrails.  First and last steps.  Where the edge of each step is.  Use tools that help you move around (mobility aids) if they are needed. These include:  Canes.  Walkers.  Scooters.  Crutches.  Turn on the lights when you go into a dark area. Replace any light bulbs as soon as they burn out.  Set up your furniture so you have a clear path. Avoid moving your furniture around.  If any of your floors are uneven, fix them.  If there are any pets around you, be aware of where they are.  Review your medicines with your doctor. Some medicines can make you feel dizzy. This can increase your chance of falling. Ask your doctor what other things that you can do to help prevent falls. This information is not intended to replace advice given to you by your health care provider. Make sure you discuss any questions you have with your health care provider. Document Released: 12/24/2008 Document Revised: 08/05/2015 Document Reviewed: 04/03/2014 Elsevier Interactive Patient Education  2017 [employer].

## 2019-09-25 LAB — SARS-COV-2, NAA 2 DAY TAT

## 2019-09-25 LAB — NOVEL CORONAVIRUS, NAA: SARS-CoV-2, NAA: NOT DETECTED

## 2019-09-30 DIAGNOSIS — H524 Presbyopia: Secondary | ICD-10-CM | POA: Diagnosis not present

## 2019-10-03 ENCOUNTER — Encounter: Payer: Self-pay | Admitting: Nurse Practitioner

## 2019-10-20 ENCOUNTER — Telehealth: Payer: Self-pay | Admitting: Hematology and Oncology

## 2019-10-20 ENCOUNTER — Other Ambulatory Visit: Payer: Self-pay

## 2019-10-20 ENCOUNTER — Inpatient Hospital Stay: Payer: Medicare Other | Attending: Hematology and Oncology

## 2019-10-20 ENCOUNTER — Other Ambulatory Visit: Payer: Self-pay | Admitting: *Deleted

## 2019-10-20 DIAGNOSIS — M1990 Unspecified osteoarthritis, unspecified site: Secondary | ICD-10-CM | POA: Diagnosis not present

## 2019-10-20 DIAGNOSIS — D61818 Other pancytopenia: Secondary | ICD-10-CM

## 2019-10-20 DIAGNOSIS — M109 Gout, unspecified: Secondary | ICD-10-CM | POA: Insufficient documentation

## 2019-10-20 DIAGNOSIS — Z87891 Personal history of nicotine dependence: Secondary | ICD-10-CM | POA: Insufficient documentation

## 2019-10-20 DIAGNOSIS — D472 Monoclonal gammopathy: Secondary | ICD-10-CM | POA: Insufficient documentation

## 2019-10-20 DIAGNOSIS — D573 Sickle-cell trait: Secondary | ICD-10-CM | POA: Insufficient documentation

## 2019-10-20 DIAGNOSIS — D649 Anemia, unspecified: Secondary | ICD-10-CM | POA: Diagnosis not present

## 2019-10-20 DIAGNOSIS — I10 Essential (primary) hypertension: Secondary | ICD-10-CM | POA: Insufficient documentation

## 2019-10-20 DIAGNOSIS — Z79899 Other long term (current) drug therapy: Secondary | ICD-10-CM | POA: Insufficient documentation

## 2019-10-20 DIAGNOSIS — D72819 Decreased white blood cell count, unspecified: Secondary | ICD-10-CM | POA: Insufficient documentation

## 2019-10-20 DIAGNOSIS — G4733 Obstructive sleep apnea (adult) (pediatric): Secondary | ICD-10-CM | POA: Insufficient documentation

## 2019-10-20 LAB — CMP (CANCER CENTER ONLY)
ALT: 15 U/L (ref 0–44)
AST: 21 U/L (ref 15–41)
Albumin: 4.1 g/dL (ref 3.5–5.0)
Alkaline Phosphatase: 68 U/L (ref 38–126)
Anion gap: 9 (ref 5–15)
BUN: 16 mg/dL (ref 8–23)
CO2: 24 mmol/L (ref 22–32)
Calcium: 9.4 mg/dL (ref 8.9–10.3)
Chloride: 109 mmol/L (ref 98–111)
Creatinine: 1.53 mg/dL — ABNORMAL HIGH (ref 0.61–1.24)
GFR, Est AFR Am: 52 mL/min — ABNORMAL LOW (ref >60)
GFR, Estimated: 45 mL/min — ABNORMAL LOW (ref >60)
Glucose, Bld: 117 mg/dL — ABNORMAL HIGH (ref 70–99)
Potassium: 3.9 mmol/L (ref 3.5–5.1)
Sodium: 142 mmol/L (ref 135–145)
Total Bilirubin: 0.6 mg/dL (ref 0.3–1.2)
Total Protein: 7.4 g/dL (ref 6.5–8.1)

## 2019-10-20 LAB — CBC WITH DIFFERENTIAL (CANCER CENTER ONLY)
Abs Immature Granulocytes: 0.01 10*3/uL (ref 0.00–0.07)
Basophils Absolute: 0 10*3/uL (ref 0.0–0.1)
Basophils Relative: 1 %
Eosinophils Absolute: 0.1 10*3/uL (ref 0.0–0.5)
Eosinophils Relative: 2 %
HCT: 37.6 % — ABNORMAL LOW (ref 39.0–52.0)
Hemoglobin: 12.9 g/dL — ABNORMAL LOW (ref 13.0–17.0)
Immature Granulocytes: 0 %
Lymphocytes Relative: 40 %
Lymphs Abs: 1.2 10*3/uL (ref 0.7–4.0)
MCH: 31.2 pg (ref 26.0–34.0)
MCHC: 34.3 g/dL (ref 30.0–36.0)
MCV: 91 fL (ref 80.0–100.0)
Monocytes Absolute: 0.2 10*3/uL (ref 0.1–1.0)
Monocytes Relative: 7 %
Neutro Abs: 1.5 10*3/uL — ABNORMAL LOW (ref 1.7–7.7)
Neutrophils Relative %: 50 %
Platelet Count: 115 10*3/uL — ABNORMAL LOW (ref 150–400)
RBC: 4.13 MIL/uL — ABNORMAL LOW (ref 4.22–5.81)
RDW: 13.2 % (ref 11.5–15.5)
WBC Count: 3 10*3/uL — ABNORMAL LOW (ref 4.0–10.5)
nRBC: 0 % (ref 0.0–0.2)

## 2019-10-20 LAB — FERRITIN: Ferritin: 19 ng/mL — ABNORMAL LOW (ref 24–336)

## 2019-10-20 LAB — IRON AND TIBC
Iron: 95 ug/dL (ref 42–163)
Saturation Ratios: 24 % (ref 20–55)
TIBC: 403 ug/dL (ref 202–409)
UIBC: 308 ug/dL (ref 117–376)

## 2019-10-20 LAB — LACTATE DEHYDROGENASE: LDH: 170 U/L (ref 98–192)

## 2019-10-20 NOTE — Telephone Encounter (Signed)
R/s 8/16 appt per provider PAL. Called and left msg. Mailed printout

## 2019-10-21 LAB — KAPPA/LAMBDA LIGHT CHAINS
Kappa free light chain: 48.3 mg/L — ABNORMAL HIGH (ref 3.3–19.4)
Kappa, lambda light chain ratio: 2.68 — ABNORMAL HIGH (ref 0.26–1.65)
Lambda free light chains: 18 mg/L (ref 5.7–26.3)

## 2019-10-23 ENCOUNTER — Other Ambulatory Visit: Payer: Medicare Other

## 2019-10-23 DIAGNOSIS — Z20822 Contact with and (suspected) exposure to covid-19: Secondary | ICD-10-CM | POA: Diagnosis not present

## 2019-10-23 LAB — MULTIPLE MYELOMA PANEL, SERUM
Albumin SerPl Elph-Mcnc: 3.8 g/dL (ref 2.9–4.4)
Albumin/Glob SerPl: 1.2 (ref 0.7–1.7)
Alpha 1: 0.2 g/dL (ref 0.0–0.4)
Alpha2 Glob SerPl Elph-Mcnc: 0.6 g/dL (ref 0.4–1.0)
B-Globulin SerPl Elph-Mcnc: 1.1 g/dL (ref 0.7–1.3)
Gamma Glob SerPl Elph-Mcnc: 1.3 g/dL (ref 0.4–1.8)
Globulin, Total: 3.2 g/dL (ref 2.2–3.9)
IgA: 168 mg/dL (ref 61–437)
IgG (Immunoglobin G), Serum: 1241 mg/dL (ref 603–1613)
IgM (Immunoglobulin M), Srm: 110 mg/dL (ref 15–143)
M Protein SerPl Elph-Mcnc: 0.1 g/dL — ABNORMAL HIGH
Total Protein ELP: 7 g/dL (ref 6.0–8.5)

## 2019-10-24 ENCOUNTER — Encounter: Payer: Self-pay | Admitting: Hematology and Oncology

## 2019-10-24 ENCOUNTER — Inpatient Hospital Stay: Payer: Medicare Other | Admitting: Hematology and Oncology

## 2019-10-24 ENCOUNTER — Other Ambulatory Visit: Payer: Self-pay

## 2019-10-24 VITALS — BP 141/76 | HR 60 | Temp 96.3°F | Resp 18 | Ht 67.0 in | Wt 272.3 lb

## 2019-10-24 DIAGNOSIS — I10 Essential (primary) hypertension: Secondary | ICD-10-CM | POA: Diagnosis not present

## 2019-10-24 DIAGNOSIS — D696 Thrombocytopenia, unspecified: Secondary | ICD-10-CM | POA: Diagnosis not present

## 2019-10-24 DIAGNOSIS — D472 Monoclonal gammopathy: Secondary | ICD-10-CM

## 2019-10-24 DIAGNOSIS — Z87891 Personal history of nicotine dependence: Secondary | ICD-10-CM | POA: Diagnosis not present

## 2019-10-24 DIAGNOSIS — G4733 Obstructive sleep apnea (adult) (pediatric): Secondary | ICD-10-CM | POA: Diagnosis not present

## 2019-10-24 DIAGNOSIS — Z79899 Other long term (current) drug therapy: Secondary | ICD-10-CM | POA: Diagnosis not present

## 2019-10-24 DIAGNOSIS — D72819 Decreased white blood cell count, unspecified: Secondary | ICD-10-CM | POA: Diagnosis not present

## 2019-10-24 DIAGNOSIS — D649 Anemia, unspecified: Secondary | ICD-10-CM | POA: Diagnosis not present

## 2019-10-24 DIAGNOSIS — M109 Gout, unspecified: Secondary | ICD-10-CM | POA: Diagnosis not present

## 2019-10-24 DIAGNOSIS — M1990 Unspecified osteoarthritis, unspecified site: Secondary | ICD-10-CM | POA: Diagnosis not present

## 2019-10-24 NOTE — Progress Notes (Signed)
Calvin [name] [phone]   [phone]  PROGRESS NOTE  Patient Care Team: Nche, Charlene Brooke, NP as PCP - General (Internal Medicine) Franchot Gallo, MD as Consulting Physician (Urology) Netta Cedars, MD as Consulting Physician (Orthopedic Surgery)  Hematological/Oncological History  # Normocytic Anemia 1) 05/29/2018: HB electrophoresis confirms sickle cell trait. Ferritin 18. Hgb 13.1. MMA 107 2) 01/22/2019: iron 112, TIBC 313, Sat 36%, ferritin 31. Hgb 12.6 3) 01/27/2019: establish care with Dr. Lorenso [name]. WBC 2.6, Hgb 12.6, Plt 121, MCV 91.4 4) 10/20/2019: WBC 3.0, Hgb 12.9, Plt 115, MCV 91   #IgG Kappa MGUS 1) 09/18/18:  M protein 0.2, kappa 38, lambda 17.6, ratio 2.2 2) 01/27/2019: establish care with Dr. Lorenso [name]  3) 10/20/2019: M protein 0.1, Kappa 48.3, Lambda 18, Ratio 2.68  #Leukopenia 1) Chronically low, with WBC 3.5 dating back to 03/24/2009 2) 2015-2020: stable at baseline WBC 3.5-5.0 3) 01/27/2019: establish care with Dr. Lorenso [name].  WBC 2.6, Hgb 12.6, Plt 121, MCV 91.4 4) 10/20/2019: WBC 3.0, Hgb 12.9, Plt 115, MCV 91  Interval History:  Calvin [name]. 69 y.o. male with medical history significant for MGUS presents for a follow up visit. He was last seen on 04/30/2019. In the interim since our last visit he has had no ED visits or hospitalizations.   On exam today Calvin [name] is accompanied by his wife.  He reports that he has been well since his last visit and denies having any issues with bleeding, bruising, or dark stools.  He reports that his energy levels have been good and he has not noticed any big difference since our last visit 6 months ago.  His weight has been stable at approximately 272 pounds, though he does report he has a goal of losing 10 pounds.  He reports he has been eating fruit instead of sweets and has been doing his best to cut back on high-calorie foods.  He reports that he has had no bone pain, back pain, or other  changes to his health since our last visit.  A full 10 point ROS is listed below.  MEDICAL HISTORY:  Past Medical History:  Diagnosis Date  . Allergy   . Arthritis   . Diverticulosis   . Gout   . Hyperlipidemia   . Hypertension   . Iron deficiency anemia 09/20/2018  . OSA (obstructive sleep apnea) 11/13/2013  . Sickle cell trait ([place])   . Sleep apnea     SURGICAL HISTORY: Past Surgical History:  Procedure Laterality Date  . COLONOSCOPY    . POLYPECTOMY    . ROTATOR CUFF REPAIR     left  . THROAT SURGERY     polyps removed from vocal cord    SOCIAL HISTORY: Social History   Socioeconomic History  . Marital status: Married    Spouse name: Not on file  . Number of children: 1  . Years of education: Not on file  . Highest education level: Not on file  Occupational History  . Occupation: retired  Tobacco Use  . Smoking status: Former Smoker    Packs/day: 2.00    Years: 5.00    Pack years: 10.00    Types: Cigarettes    Quit date: 05/23/1977    Years since quitting: 42.4  . Smokeless tobacco: Never Used  Vaping Use  . Vaping Use: Never used  Substance and Sexual Activity  . Alcohol use: No    Comment: QUIT 2002  . Drug use: No  Comment: Completed program in april 2000  . Sexual activity: Yes  Other Topics Concern  . Not on file  Social History Narrative  . Not on file   Social Determinants of Health   Financial Resource Strain: Low Risk   . Difficulty of Paying Living Expenses: Not hard at all  Food Insecurity: No Food Insecurity  . Worried About Charity fundraiser in the Last Year: Never true  . Ran Out of Food in the Last Year: Never true  Transportation Needs: No Transportation Needs  . Lack of Transportation (Medical): No  . Lack of Transportation (Non-Medical): No  Physical Activity: Sufficiently Active  . Days of Exercise per Week: 3 days  . Minutes of Exercise per Session: 60 min  Stress: No Stress Concern Present  . Feeling of Stress : Not at  all  Social Connections: Socially Integrated  . Frequency of Communication with Friends and Family: More than three times a week  . Frequency of Social Gatherings with Friends and Family: Twice a week  . Attends Religious Services: More than 4 times per year  . Active Member of Clubs or Organizations: Yes  . Attends Archivist Meetings: More than 4 times per year  . Marital Status: Married  Human resources officer Violence:   . Fear of Current or Ex-Partner:   . Emotionally Abused:   Marland Kitchen Physically Abused:   . Sexually Abused:     FAMILY HISTORY: Family History  Problem Relation Age of Onset  . Colon cancer Neg Hx   . Esophageal cancer Neg Hx   . Rectal cancer Neg Hx   . Stomach cancer Neg Hx     ALLERGIES:  has No Known Allergies.  MEDICATIONS:  Current Outpatient Medications  Medication Sig Dispense Refill  . allopurinol (ZYLOPRIM) 300 MG tablet Take 1 tablet (300 mg total) by mouth daily. 90 tablet 3  . aspirin 81 MG tablet Take 81 mg by mouth daily.      . Blood Pressure Monitoring (B-D ASSURE BPM/AUTO ARM CUFF) MISC 1 Units by Does not apply route daily. Arm circumference: 40cm 1 each 0  . colchicine 0.6 MG tablet Take 0.5 tablets (0.3 mg total) by mouth daily as needed. (Patient not taking: Reported on 09/24/2019) 45 tablet 0  . fenofibrate 54 MG tablet Take 1 tablet (54 mg total) by mouth daily. 90 tablet 3  . finasteride (PROSCAR) 5 MG tablet TAKE 1 TABLET BY MOUTH  DAILY 90 tablet 3  . irbesartan (AVAPRO) 300 MG tablet Take 1 tablet (300 mg total) by mouth daily. 90 tablet 3  . loratadine (CLARITIN) 10 MG tablet Take 10 mg by mouth daily as needed for allergies. (Patient not taking: Reported on 09/24/2019)    . nabumetone (RELAFEN) 500 MG tablet TAKE 1 TABLET BY MOUTH  DAILY 90 tablet 3  . pantoprazole (PROTONIX) 40 MG tablet Take 1 tablet (40 mg total) by mouth daily. 90 tablet 3  . pravastatin (PRAVACHOL) 20 MG tablet Take 1 tablet (20 mg total) by mouth daily. 90  tablet 3  . sildenafil (VIAGRA) 100 MG tablet Take 1 tablet (100 mg total) by mouth as needed for erectile dysfunction. Yearly physical w/labs due in February must see MD for refills 10 tablet 0  . tamsulosin (FLOMAX) 0.4 MG CAPS capsule Take 1 capsule (0.4 mg total) by mouth daily. 90 capsule 3  . verapamil (CALAN-SR) 240 MG CR tablet TAKE 1 TABLET BY MOUTH AT  BEDTIME 90 tablet 3  No current facility-administered medications for this visit.    REVIEW OF SYSTEMS:   Constitutional: ( - ) fevers, ( - )  chills , ( - ) night sweats Eyes: ( - ) blurriness of vision, ( - ) double vision, ( - ) watery eyes Ears, nose, mouth, throat, and face: ( - ) mucositis, ( - ) sore throat Respiratory: ( - ) cough, ( - ) dyspnea, ( - ) wheezes Cardiovascular: ( - ) palpitation, ( - ) chest discomfort, ( - ) lower extremity swelling Gastrointestinal:  ( - ) nausea, ( - ) heartburn, ( - ) change in bowel habits Skin: ( - ) abnormal skin rashes Lymphatics: ( - ) new lymphadenopathy, ( - ) easy bruising Neurological: ( - ) numbness, ( - ) tingling, ( - ) new weaknesses Behavioral/Psych: ( - ) mood change, ( - ) new changes  All other systems were reviewed with the patient and are negative.  PHYSICAL EXAMINATION: ECOG PERFORMANCE STATUS: 1 - Symptomatic but completely ambulatory  Vitals:   10/24/19 0958  BP: (!) 141/76  Pulse: 60  Resp: 18  Temp: (!) 96.3 F (35.7 C)  SpO2: 100%   Filed Weights   10/24/19 0958  Weight: 272 lb 4.8 oz (123.5 kg)    GENERAL: well appearing African American male in NAD  SKIN: skin color, texture, turgor are normal, no rashes or significant lesions EYES: conjunctiva are pink and non-injected, sclera clear LUNGS: clear to auscultation and percussion with normal breathing effort HEART: regular rate & rhythm and no murmurs and no lower extremity edema Musculoskeletal: no cyanosis of digits and no clubbing  PSYCH: alert & oriented x 3, fluent speech NEURO: no focal  motor/sensory deficits  LABORATORY DATA:  I have reviewed the data as listed CBC Latest Ref Rng & Units 10/20/2019 04/30/2019 01/27/2019  WBC 4.0 - 10.5 K/uL 3.0(L) 2.7(L) 2.6(L)  Hemoglobin 13.0 - 17.0 g/dL 12.9(L) 13.0 12.6(L)  Hematocrit 39 - 52 % 37.6(L) 37.4(L) 37.0(L)  Platelets 150 - 400 K/uL 115(L) 149(L) 121(L)   CMP Latest Ref Rng & Units 10/20/2019 04/30/2019 04/23/2019  Glucose 70 - 99 mg/dL 117(H) 87 88  BUN 8 - 23 mg/dL _0 Creatinine 0.61 - 1.24 mg/dL 1.53(H) 1.45(H) 1.47  Sodium 135 - 145 mmol/L 142 143 141  Potassium 3.5 - 5.1 mmol/L 3.9 3.8 3.7  Chloride 98 - 111 mmol/L 109 109 106  CO2 22 - 32 mmol/L _1 Calcium 8.9 - 10.3 mg/dL 9.4 8.9 9.3  Total Protein 6.5 - 8.1 g/dL 7.4 7.5 -  Total Bilirubin 0.3 - 1.2 mg/dL 0.6 0.7 -  Alkaline Phos 38 - 126 U/L 68 53 -  AST 15 - 41 U/L 21 23 -  ALT 0 - 44 U/L 15 23 -   Lab Results  Component Value Date   KPAFRELGTCHN 48.3 (H) 10/20/2019   KPAFRELGTCHN 45.2 (H) 04/30/2019   KPAFRELGTCHN 42.4 (H) 01/27/2019   LAMBDASER 18.0 10/20/2019   LAMBDASER 21.4 04/30/2019   LAMBDASER 20.3 01/27/2019   KAPLAMBRATIO 2.68 (H) 10/20/2019   KAPLAMBRATIO 2.11 (H) 04/30/2019   KAPLAMBRATIO 24.05 01/29/2019    Lab Results  Component Value Date   MPROTEIN 0.1 (H) 10/20/2019   MPROTEIN 0.2 (H) 04/30/2019   MPROTEIN 0.2 (H) 01/27/2019     RADIOGRAPHIC STUDIES: None to review  ASSESSMENT & PLAN Calvin [name]. 69 y.o. male with medical history significant for MGUS presents for a follow up visit.  Review of Mr. Viana labs appear stable.    Given the stability of his reported health and the stability of his labs I think we can safely follow-up the patient approximately 6 months time.  At this juncture there is no clear indication for a bone marrow biopsy though I have would have a low threshold to perform this if any worsening abnormalities were noted in his blood.  We will plan to see the patient back in approximately  6 months however if he were to have any new symptoms in the interim we recommend that he call our clinic and we can always see him sooner.  #Normocytic Anemia, stable #Thrombocytopenia, stable --HB electrophoresis confirms sickle cell trait, though he has had normal baseline Hgb before in the past.  --Mr. Bring was a long time blood donor, donating as frequently as he could for years. He may have developed low iron stores due to this generosity. He last donated in 2018. His counts appear stable at this time. Iron stores appear replete as of 04/30/2019.  --etiology of thrombocytopenia is unclear. Prior abdominal imaging shows no evidence of cirrhosis, Hepatitis serologies were negative. Continue to monitor  --RTC in 6 months for further review.   #IgG Kappa Monoclonal Gammopathy of Undetermined Significance, stable  --last SPEP showed stable labs --will repeat SPEP and SFLC 1 week prior to his next visit.   --patient will require a bone survey at least yearly. Currently due.  --continue to monitor at least q 33month.   #Leukopenia, stable --low WBC dating back to 2011, stably low around 3.5-.5.0.  --05/29/2018: negative for HIV, Hep C, but had Hep B core antibody, no surface antigen or surface antibody.  --continue to monitor   No orders of the defined types were placed in this encounter.   All questions were answered. The patient knows to call the clinic with any problems, questions or concerns.  A total of more than 30 minutes were spent face-to-face with the patient during this encounter and over half of that time was spent on counseling and coordination of care as outlined above.   JLedell Peoples MD Department of Hematology/Oncology [facility]at [facility] CenterPhone: [phone]: [phone]: [email or web address]  10/24/2019 12:16 PM

## 2019-10-27 ENCOUNTER — Inpatient Hospital Stay: Payer: Medicare Other | Admitting: Hematology and Oncology

## 2019-10-27 ENCOUNTER — Telehealth: Payer: Self-pay | Admitting: Pulmonary Disease

## 2019-10-27 ENCOUNTER — Telehealth: Payer: Self-pay | Admitting: Hematology and Oncology

## 2019-10-27 DIAGNOSIS — G4733 Obstructive sleep apnea (adult) (pediatric): Secondary | ICD-10-CM

## 2019-10-27 NOTE — Telephone Encounter (Signed)
Scheduled per los. Called and left msg. Mailed printout  °

## 2019-10-27 NOTE — Telephone Encounter (Signed)
Patient requesting that pressures on his CPAP machine be changed. He feels that his machine needs to be looked at because the pressure seems higher than normal. Patient's machine is older than 5 years and he wants to know if it needs to be replaced. Please advise.

## 2019-10-28 NOTE — Telephone Encounter (Signed)
lmtcb for pt.  

## 2019-10-28 NOTE — Telephone Encounter (Signed)
Pt returning a phone call. Pt can be reached at [phone]

## 2019-10-28 NOTE — Telephone Encounter (Signed)
He needs a new machine.  Please send order for new auto CPAP with pressure range of 5 to 15 cm H2O with heated humidity.  He needs ROV 2 months after getting new machine.

## 2019-10-29 ENCOUNTER — Telehealth: Payer: Self-pay | Admitting: Pulmonary Disease

## 2019-10-29 NOTE — Telephone Encounter (Signed)
Spoke with pt to inform him that he needed a OV before CPAP order could be placed. Pt stated understanding and stated he would call office back to set up appointment. Nothing further needed at this time.

## 2019-10-29 NOTE — Telephone Encounter (Signed)
Spoke with patient and provided information per Dr. Halford Chessman.  Patient states he uses [facility] on [address] in [place].  Advised patient to call us and let us know when he starts using his new machine so we can schedule his 2 month f/u.  He verbalized understanding.  Orders sent to [place].

## 2019-10-31 ENCOUNTER — Other Ambulatory Visit: Payer: Self-pay

## 2019-10-31 ENCOUNTER — Ambulatory Visit (HOSPITAL_COMMUNITY)
Admission: RE | Admit: 2019-10-31 | Discharge: 2019-10-31 | Disposition: A | Discharge status: Home / Self Care | Payer: Medicare Other | Source: Ambulatory Visit | Attending: Hematology and Oncology | Admitting: Hematology and Oncology

## 2019-10-31 DIAGNOSIS — D472 Monoclonal gammopathy: Secondary | ICD-10-CM

## 2019-11-05 ENCOUNTER — Telehealth: Payer: Self-pay | Admitting: *Deleted

## 2019-11-05 DIAGNOSIS — Z20822 Contact with and (suspected) exposure to covid-19: Secondary | ICD-10-CM | POA: Diagnosis not present

## 2019-11-05 NOTE — Telephone Encounter (Signed)
TCT patient regarding recent bone survey. Spoke with pt and informed that his bone survey did not show any abnormalities. Advised that Dr. Lorenso Courier will see him in February 2022 as scheduled.  Pt voiced understanding and is pleased with the results.

## 2019-11-05 NOTE — Telephone Encounter (Signed)
-----   Message from Orson Slick, MD sent at 11/03/2019  9:30 AM EDT ----- Please let Calvin [name] know that his skeletal survey showed no abnormalities. We will see him back as planned in Feb 2022.  ----- Message ----- From: Buel Ream, Rad Results In Sent: 10/31/2019   4:20 PM EDT To: Orson Slick, MD

## 2019-11-06 ENCOUNTER — Ambulatory Visit: Payer: Medicare Other | Admitting: Hematology and Oncology

## 2019-11-21 DIAGNOSIS — Z20822 Contact with and (suspected) exposure to covid-19: Secondary | ICD-10-CM | POA: Diagnosis not present

## 2019-11-24 ENCOUNTER — Encounter: Payer: Self-pay | Admitting: Acute Care

## 2019-11-24 ENCOUNTER — Other Ambulatory Visit: Payer: Self-pay

## 2019-11-24 ENCOUNTER — Ambulatory Visit: Payer: Medicare Other | Admitting: Acute Care

## 2019-11-24 VITALS — BP 130/72 | HR 60 | Temp 96.9°F | Ht 68.0 in | Wt 266.0 lb

## 2019-11-24 DIAGNOSIS — G4733 Obstructive sleep apnea (adult) (pediatric): Secondary | ICD-10-CM

## 2019-11-24 DIAGNOSIS — Z9989 Dependence on other enabling machines and devices: Secondary | ICD-10-CM | POA: Diagnosis not present

## 2019-11-24 NOTE — Patient Instructions (Signed)
It is good to see you today Continue on CPAP at bedtime. You appear to be benefiting from the treatment  Goal is to wear for at least 6 hours each night for maximal clinical benefit. Continue to work on weight loss, as the link between excess weight  and sleep apnea is well established.   Remember to establish a good bedtime routine, and work on sleep hygiene.  Limit daytime naps , avoid stimulants such as caffeine and nicotine close to bedtime, exercise daily to promote sleep quality, avoid heavy , spicy, fried , or rich foods before bed. Ensure adequate exposure to natural light during the day,establish a relaxing bedtime routine with a pleasant sleep environment ( Bedroom between 60 and 67 degrees, turn off bright lights , TV or device screens screens , consider black out curtains or white noise machines) Do not drive if sleepy. Remember to clean mask, tubing, filter, and reservoir once weekly with soapy water.  Follow up with Zafir Schauer NP in 8 weeks  or before as needed with Down Load.   Please make sure patient is enrolled in [employer].

## 2019-11-24 NOTE — Progress Notes (Signed)
History of Present Illness Calvin [name]. is a 69 y.o. male former smoker ( Quit 41 years ago with a 10 pack year smoking history)with OSA on CPAP. He is followed by Dr. Halford Chessman.  PMH Sickle Cell Trait, HTN, HLD, Gout, Diverticulosis, OA, Allergies   11/24/2019  Pt. Presents for follow up of CPAP therapt. He was seen by Dr. Halford Chessman 09/17/2018. At that visit it  was determined that he would qualify for a new machine, as his machine was > than 71 years old. . He has been told he needs a face to face to qualify for a new device before an order can be placed for a new machine. He wears his device  least 6 hours each night. He has excellent compliance. He wears his device for at least an average of 7 hours per night. He denies any daytime sleepiness.   Test Results: See below, patient's report from his cell phone over the last month, and below that , over the last year. There was no report available in [employer].    AHI 2.18 events per hour Please note the period in December 2020 when the patient did not wear device he was sick with Covid.   HST 12/29/13 >> AHI 62.8, SaO2 low 71%. Auto CPAP6/01/20 to 09/10/18 >> used on 30 of 30 nights with average 7 hrs 33 min. Average AHI 2.4 with median CPAP 10 and 95 th percentile CPAP 13 cm H2O  CBC Latest Ref Rng & Units 10/20/2019 04/30/2019 01/27/2019  WBC 4.0 - 10.5 K/uL 3.0(L) 2.7(L) 2.6(L)  Hemoglobin 13.0 - 17.0 g/dL 12.9(L) 13.0 12.6(L)  Hematocrit 39 - 52 % 37.6(L) 37.4(L) 37.0(L)  Platelets 150 - 400 K/uL 115(L) 149(L) 121(L)    BMP Latest Ref Rng & Units 10/20/2019 04/30/2019 04/23/2019  Glucose 70 - 99 mg/dL 117(H) 87 88  BUN 8 - 23 mg/dL _0 Creatinine 0.61 - 1.24 mg/dL 1.53(H) 1.45(H) 1.47  Sodium 135 - 145 mmol/L 142 143 141  Potassium 3.5 - 5.1 mmol/L 3.9 3.8 3.7  Chloride 98 - 111 mmol/L 109 109 106  CO2 22 - 32 mmol/L _1 Calcium 8.9 - 10.3 mg/dL 9.4 8.9 9.3    BNP No results found for: BNP  ProBNP No results found for:  PROBNP  PFT No results found for: FEV1PRE, FEV1POST, FVCPRE, FVCPOST, TLC, DLCOUNC, PREFEV1FVCRT, PSTFEV1FVCRT  DG Bone Survey Met  Result Date: 10/31/2019 CLINICAL DATA:  MGUS EXAM: METASTATIC BONE SURVEY COMPARISON:  CT 06/05/2018 FINDINGS: Images of the axial and appendicular skeleton are performed. Calvarium:No lytic bone lesion identified. Chest and Abdomen:No lytic bone lesion identified.  Lungs are clear. Spine:No lytic bone lesion identified.  Multilevel spondylosis. UPPER extremities:No lytic bone lesion identified. Pelvis and LOWER extremities:No lytic bone lesion identified. IMPRESSION: No lytic bone lesions identified. Electronically Signed   By: Davina Poke D.O.   On: 10/31/2019 16:18     Past medical hx Past Medical History:  Diagnosis Date  . Allergy   . Arthritis   . Diverticulosis   . Gout   . Hyperlipidemia   . Hypertension   . Iron deficiency anemia 09/20/2018  . OSA (obstructive sleep apnea) 11/13/2013  . Sickle cell trait ([place])   . Sleep apnea      Social History   Tobacco Use  . Smoking status: Former Smoker    Packs/day: 2.00    Years: 5.00    Pack years: 10.00    Types:  Cigarettes    Quit date: 05/23/1977    Years since quitting: 42.5  . Smokeless tobacco: Never Used  Vaping Use  . Vaping Use: Never used  Substance Use Topics  . Alcohol use: No    Comment: QUIT 2002  . Drug use: No    Comment: Completed program in april 2000    [name] reports that he quit smoking about 42 years ago. His smoking use included cigarettes. He has a 10.00 pack-year smoking history. He has never used smokeless tobacco. He reports that he does not drink alcohol and does not use drugs.  Tobacco Cessation: Former smoker quit 05/1077 with a 10 pack year smoking history  Past surgical hx, Family hx, Social hx all reviewed.  Current Outpatient Medications on File Prior to Visit  Medication Sig  . allopurinol (ZYLOPRIM) 300 MG tablet Take 1 tablet (300 mg total)  by mouth daily.  Marland Kitchen aspirin 81 MG tablet Take 81 mg by mouth daily.    . Blood Pressure Monitoring (B-D ASSURE BPM/AUTO ARM CUFF) MISC 1 Units by Does not apply route daily. Arm circumference: 40cm  . colchicine 0.6 MG tablet Take 0.5 tablets (0.3 mg total) by mouth daily as needed.  . fenofibrate 54 MG tablet Take 1 tablet (54 mg total) by mouth daily.  . finasteride (PROSCAR) 5 MG tablet TAKE 1 TABLET BY MOUTH  DAILY  . irbesartan (AVAPRO) 300 MG tablet Take 1 tablet (300 mg total) by mouth daily.  Marland Kitchen loratadine (CLARITIN) 10 MG tablet Take 10 mg by mouth daily as needed for allergies.   . nabumetone (RELAFEN) 500 MG tablet TAKE 1 TABLET BY MOUTH  DAILY  . pantoprazole (PROTONIX) 40 MG tablet Take 1 tablet (40 mg total) by mouth daily.  . pravastatin (PRAVACHOL) 20 MG tablet Take 1 tablet (20 mg total) by mouth daily.  . sildenafil (VIAGRA) 100 MG tablet Take 1 tablet (100 mg total) by mouth as needed for erectile dysfunction. Yearly physical w/labs due in February must see MD for refills  . tamsulosin (FLOMAX) 0.4 MG CAPS capsule Take 1 capsule (0.4 mg total) by mouth daily.  . verapamil (CALAN-SR) 240 MG CR tablet TAKE 1 TABLET BY MOUTH AT  BEDTIME   No current facility-administered medications on file prior to visit.     No Known Allergies  Review Of Systems:  Constitutional:   No  weight loss, night sweats,  Fevers, chills, fatigue, or  lassitude.  HEENT:   No headaches,  Difficulty swallowing,  Tooth/dental problems, or  Sore throat,                No sneezing, itching, ear ache, nasal congestion, post nasal drip,   CV:  No chest pain,  Orthopnea, PND, swelling in lower extremities, anasarca, dizziness, palpitations, syncope.   GI  No heartburn, indigestion, abdominal pain, nausea, vomiting, diarrhea, change in bowel habits, loss of appetite, bloody stools.   Resp: No shortness of breath with exertion or at rest.  No excess mucus, no productive cough,  No non-productive cough,  No  coughing up of blood.  No change in color of mucus.  No wheezing.  No chest wall deformity  Skin: no rash or lesions.  GU: no dysuria, change in color of urine, no urgency or frequency.  No flank pain, no hematuria   MS:  No joint pain or swelling.  No decreased range of motion.  No back pain.  Psych:  No change in mood or affect. No depression  or anxiety.  No memory loss.   Vital Signs BP 130/72   Pulse 60   Temp (!) 96.9 F (36.1 C) (Oral)   Ht _0  (1.727 m)   Wt 266 lb (120.7 kg)   SpO2 99%   BMI 40.45 kg/m    Physical Exam:  General- No distress,  A&Ox3, pleasant ENT: No sinus tenderness, TM clear, pale nasal mucosa, no oral exudate,no post nasal drip, no LAN Cardiac: S1, S2, regular rate and rhythm, no murmur Chest: No wheeze/ rales/ dullness; no accessory muscle use, no nasal flaring, no sternal retractions Abd.: Soft Non-tender, ND, BS +, Body mass index is 40.45 kg/m. Ext: No clubbing cyanosis, edema Neuro:  normal strength, MAE x 4, A&O x 3 Skin: No rashes, warm and dry, no lesions Psych: normal mood and behavior   Assessment/Plan  OSA on CPAP Good compliance  Plan Continue on CPAP at bedtime. You appear to be benefiting from the treatment  Goal is to wear for at least 6 hours each night for maximal clinical benefit. Continue to work on weight loss, as the link between excess weight  and sleep apnea is well established.   Remember to establish a good bedtime routine, and work on sleep hygiene.  Limit daytime naps , avoid stimulants such as caffeine and nicotine close to bedtime, exercise daily to promote sleep quality, avoid heavy , spicy, fried , or rich foods before bed. Ensure adequate exposure to natural light during the day,establish a relaxing bedtime routine with a pleasant sleep environment ( Bedroom between 60 and 67 degrees, turn off bright lights , TV or device screens screens , consider black out curtains or white noise machines) Do not drive if  sleepy. Remember to clean mask, tubing, filter, and reservoir once weekly with soapy water.  Follow up with Geralda Baumgardner NP in 8 weeks  or before as needed with Down Load.   Please make sure patient is enrolled in [employer].    Magdalen Spatz, NP 11/24/2019  11:47 AM

## 2019-11-25 ENCOUNTER — Telehealth: Payer: Self-pay | Admitting: Nurse Practitioner

## 2019-11-25 NOTE — Progress Notes (Signed)
  Chronic Care Management   Note  11/25/2019 Name: Halston Kintz. MRN: [id number] DOB: Oct 01, 1950  [name] is a 69 y.o. year old male who is a primary care patient of Nche, Charlene Brooke, NP. I reached out to [name] by phone today in response to a referral sent by Mr. DHIREN AZIMI Jr.'s PCP, Nche, Charlene Brooke, NP.   Mr. Whedbee was given information about Chronic Care Management services today including:  1. CCM service includes personalized support from designated clinical staff supervised by his physician, including individualized plan of care and coordination with other care providers 2. 24/7 contact phone numbers for assistance for urgent and routine care needs. 3. Service will only be billed when office clinical staff spend 20 minutes or more in a month to coordinate care. 4. Only one practitioner may furnish and bill the service in a calendar month. 5. The patient may stop CCM services at any time (effective at the end of the month) by phone call to the office staff.   Patient agreed to services and verbal consent obtained.   Follow up plan:   Carley Perdue [employer] Scheduler

## 2019-12-04 ENCOUNTER — Other Ambulatory Visit: Payer: Self-pay | Admitting: Internal Medicine

## 2019-12-12 ENCOUNTER — Telehealth: Payer: Self-pay

## 2019-12-12 NOTE — Chronic Care Management (AMB) (Signed)
Chronic Care Management Pharmacy  Name: Calvin [name].  MRN: [id number] DOB: Feb 02, 1951   Chief Complaint/ HPI  Calvin Broad.,  69 y.o. , male presents for their Initial CCM visit with the clinical pharmacist In office.  PCP : Flossie Buffy, NP Patient Care Team: Nche, Charlene Brooke, NP as PCP - General (Internal Medicine) Franchot Gallo, MD as Consulting Physician (Urology) Netta Cedars, MD as Consulting Physician (Orthopedic Surgery) Germaine Pomfret, [facility] as Pharmacist (Pharmacist)  Their chronic conditions include: Hypertension, Hyperlipidemia, BPH, Gout and Peripheral Vascular Disease   Office Visits: 09/24/19: Patient presented to Caroleen Hamman, LPN for AWV. 2/63/33: Patient presented to Wilfred Lacy, NP for follow-up. No medication changes made.  Consult Visit: 11/24/19: Patient presented to Eric Form, NP for OSA follow-up.  10/24/19: Patient presented to Dr. Lorenso Courier (Oncology) for MGUS follow-up.   No Known Allergies  Medications: Outpatient Encounter Medications as of 12/15/2019  Medication Sig Note  . allopurinol (ZYLOPRIM) 300 MG tablet Take 1 tablet (300 mg total) by mouth daily.   Marland Kitchen aspirin 81 MG tablet Take 81 mg by mouth daily.     . fenofibrate 54 MG tablet Take 1 tablet (54 mg total) by mouth daily.   . finasteride (PROSCAR) 5 MG tablet TAKE 1 TABLET BY MOUTH  DAILY   . irbesartan (AVAPRO) 300 MG tablet Take 1 tablet (300 mg total) by mouth daily.   . nabumetone (RELAFEN) 500 MG tablet TAKE 1 TABLET BY MOUTH  DAILY   . pantoprazole (PROTONIX) 40 MG tablet TAKE 1 TABLET BY MOUTH  DAILY   . pravastatin (PRAVACHOL) 20 MG tablet Take 1 tablet (20 mg total) by mouth daily.   . tamsulosin (FLOMAX) 0.4 MG CAPS capsule Take 1 capsule (0.4 mg total) by mouth daily.   . verapamil (CALAN-SR) 240 MG CR tablet TAKE 1 TABLET BY MOUTH AT  BEDTIME   . Blood Pressure Monitoring (B-D ASSURE BPM/AUTO ARM CUFF) MISC 1 Units by Does not apply route  daily. Arm circumference: 40cm   . colchicine 0.6 MG tablet Take 0.5 tablets (0.3 mg total) by mouth daily as needed. 10/24/2019: As needed  . loratadine (CLARITIN) 10 MG tablet Take 10 mg by mouth daily as needed for allergies.  (Patient not taking: Reported on 12/15/2019)   . sildenafil (VIAGRA) 100 MG tablet Take 1 tablet (100 mg total) by mouth as needed for erectile dysfunction. Yearly physical w/labs due in February must see MD for refills (Patient not taking: Reported on 12/15/2019)    No facility-administered encounter medications on file as of 12/15/2019.    Wt Readings from Last 3 Encounters:  12/15/19 263 lb 6.4 oz (119.5 kg)  11/24/19 266 lb (120.7 kg)  10/24/19 272 lb 4.8 oz (123.5 kg)    Current Diagnosis/Assessment:  SDOH Interventions     Most Recent Value  SDOH Interventions  Financial Strain Interventions Intervention Not Indicated  Transportation Interventions Intervention Not Indicated      Goals Addressed            This Visit's Progress   . Chronic Care Management       CARE PLAN ENTRY (see longitudinal plan of care for additional care plan information)  Current Barriers:  . Chronic Disease Management support, education, and care coordination needs related to Hypertension, Hyperlipidemia, BPH, Gout and Peripheral Vascular Disease    Hypertension BP Readings from Last 3 Encounters:  11/24/19 130/72  10/24/19 (!) 141/76  09/24/19 140/78   .  Pharmacist Clinical Goal(s): o Over the next 90 days, patient will work with PharmD and providers to achieve BP goal <130/80 . Current regimen:  . Irbesartan 300 mg daily  . Verapamil SR 240 mg nightly . Interventions: o Discussed low salt diet and exercising as tolerated extensively o Will initiate blood pressure monitoring plan  . Patient self care activities - Over the next 90 days, patient will: o Check Blood Pressure 1-2 times weekly, document, and provide at future appointments o Ensure daily salt intake <  2300 mg/day  Hyperlipidemia Lab Results  Component Value Date/Time   LDLCALC 123 (H) 04/23/2019 10:01 AM   LDLCALC 64 01/22/2019 02:21 PM   LDLDIRECT 61.0 11/26/2018 10:36 AM   . Pharmacist Clinical Goal(s): o Over the next 90 days, patient will work with PharmD and providers to achieve LDL goal < 70 . Current regimen:  . Fenofibrate 54 mg daily . Pravastatin 20 mg daily  . Interventions: o Discussed low cholesterol diet and exercising as tolerated extensively o Will initiate cholesterol monitoring plan    Medication management . Pharmacist Clinical Goal(s): o Over the next 90 days, patient will work with PharmD and providers to maintain optimal medication adherence . Current pharmacy: [facility] . Interventions o Comprehensive medication review performed. o Continue current medication management strategy . Patient self care activities - Over the next 90 days, patient will: o Take medications as prescribed o Report any questions or concerns to PharmD and/or provider(s)      Hypertension   BP goal is:  <130/80  Office blood pressures are  BP Readings from Last 3 Encounters:  11/24/19 130/72  10/24/19 (!) 141/76  09/24/19 140/78   Patient checks BP at home Never Patient home BP readings are ranging: n/a  Patient has failed these meds in the past: n/a Patient is currently uncontrolled on the following medications:  . Irbesartan 300 mg daily  . Verapamil SR 240 mg QHS   We discussed diet and exercise extensively. Does not add salt to food. Discussed the importance of regular BP monitoring to monitor blood pressure.   Plan  Continue current medications  Increase monitoring to 1-2 x weekly  CPA HTN assessment in one month to assess home BP readings.   Hyperlipidemia   History of Peripheral Vascular Disease  LDL goal < 70  Lipid Panel     Component Value Date/Time   CHOL 174 04/23/2019 1001   TRIG 143.0 04/23/2019 1001   TRIG 41 02/12/2006 0738    HDL 22.90 (L) 04/23/2019 1001   LDLCALC 123 (H) 04/23/2019 1001   LDLCALC 64 01/22/2019 1421   LDLDIRECT 61.0 11/26/2018 1036    Hepatic Function Latest Ref Rng & Units 10/20/2019 04/30/2019 01/27/2019  Total Protein 6.5 - 8.1 g/dL 7.4 7.5 7.4  Albumin 3.5 - 5.0 g/dL 4.1 4.4 4.2  AST 15 - 41 U/L _0 ALT 0 - 44 U/L _1 Alk Phosphatase 38 - 126 U/L 68 53 73  Total Bilirubin 0.3 - 1.2 mg/dL 0.6 0.7 0.6  Bilirubin, Direct 0.0 - 0.3 mg/dL - - -     The 10-year ASCVD risk score Mikey Bussing DC Jr., et al., 2013) is: 24%   Values used to calculate the score:     Age: 58 years     Sex: Male     Is Non-Hispanic African American: Yes     Diabetic: No     Tobacco smoker: No     Systolic  Blood Pressure: 130 mmHg     Is BP treated: Yes     HDL Cholesterol: 22.9 mg/dL     Total Cholesterol: 174 mg/dL   Patient has failed these meds in past: n/a Patient is currently uncontrolled on the following medications:  . Aspirin 81 mg daily  . Fenofibrate 54 mg daily . Pravastatin 20 mg daily   We discussed:  diet and exercise extensively. Patient has made significant changes to his diet this year. He has cut back on fatty meals, eating much less fried chicken, sausage biscuits and ham biscuits. He has also cut back on most desserts and sodas (he will still have occasionally).   Given ASCVD risk >20% and PVD, patient would likely benefit from high intensity statin. Patient preferred to wait until rechecking cholesterol panel prior to making changes.   Plan  Continue current medications  BPH   Managed Dr. Diona Fanti   PSA  Date Value Ref Range Status  05/09/2018 2.17 0.10 - 4.00 ng/mL Final    Comment:    Test performed using Access Hybritech PSA Assay, a parmagnetic partical, chemiluminecent immunoassay.  05/01/2018 3.24 0.10 - 4.00 ng/mL Final    Comment:    Test performed using Access Hybritech PSA Assay, a parmagnetic partical, chemiluminecent immunoassay.  04/30/2017 1.95 0.10 - 4.00  ng/mL Final    Comment:    Test performed using Access Hybritech PSA Assay, a parmagnetic partical, chemiluminecent immunoassay.     Patient has failed these meds in past: n/a Patient is currently controlled on the following medications:  . Finasteride 5 mg daily  . Tamsulosin 0.4 mg daily   We discussed:  Urinary symptoms significantly improved since starting this regimen. Patient does endorse some erectile dysfunction but otherwise is tolerating well.  Plan  Continue current medications  Gout   Uric Acid, Serum  Date Value Ref Range Status  05/29/2018 4.7 3.7 - 8.6 mg/dL Final    Comment:    Performed at [facility], [address], [place], [place] [address]  07/17/2017 6.4 4.0 - 7.8 mg/dL Final  03/04/2010 7.8 4.0 - 7.8 mg/dL Final     Goal Uric Acid < 6 mg/dL   Medications that may increase uric acid levels: Aspirin  Last gout flare: June 2021  Patient has failed these meds in past: n/a Patient is currently controlled on the following medications:  . Allopurinol 300 mg daily  . Colchicine 1/2 tablet daily PRN   We discussed:  Counseled patient on low purine diet plan. Counseled patient to reduce consumption of high-fructose corn syrup, sweetened soft drinks, fruit juices, meat, and seafood. Counseled patient to avoid alcohol consumption.   Plan  Continue current medications  Allergic Rhinitis   Patient has failed these meds in past: n/a Patient is currently controlled on the following medications:  . Loratadine 10 mg daily PRN   We discussed:  Only takes starting in February - May   Plan  Continue current medications  GERD   Upper endoscopy (11/19/18) saw gastritis - likely NSAID induced.   Patient denies dysphagia, heartburn or nausea. Expresses understanding to avoid triggers such as fatty foods, large meals and lying down after eating.  Currently controlled on: . Pantoprazole 40 mg daily (started Sep 2019)   Patient  wanting to know if he needs to stay on this medication long-term. Given endoscopy findings, will need to consult with GI prior to making a recommendation.   Plan   Continue current medication.  Erectile Dysfunction     Patient has failed these meds in past: n/a Patient is currently controlled on the following medications:  . Sildenafil 100 mg daily PRN   We discussed:  Rarely uses due to the need to have to plan his sexual activity. When he does take, he reports some lightheadedness or headaches.   Plan  Continue current medications   Joint Pain / Osteoarthritis   . Nabumetone 500 mg daily  We discussed:  Risk of NSAIDs on GI and kidneys.   Plan  Recommend stopping nabumetone due to risk of kidney damage.  Recommend starting Tylenol Arthritis CR 650 mg q6hr PRN    Vaccines   Reviewed and discussed patient's vaccination history.    Immunization History  Administered Date(s) Administered  . Fluad Quad(high Dose 65+) 11/26/2018  . H1N1 03/30/2008  . Influenza Split 01/18/2012, 11/11/2013  . Influenza Whole 01/14/2007, 01/11/2009  . Influenza, High Dose Seasonal PF 01/03/2016  . Influenza-Unspecified 02/02/2015, 01/04/2017, 01/02/2018  . PFIZER SARS-COV-2 Vaccination 04/19/2019, 05/12/2019  . Pneumococcal Conjugate-13 08/27/2013  . Pneumococcal Polysaccharide-23 12/10/2013  . Td 10/19/2007  . Tdap 05/09/2018  . Zoster 04/01/2009  . Zoster Recombinat (Shingrix) 01/22/2019, 06/24/2019    Fluzone 65+ Shot 12/08/19  Arabi Covid-19 12/08/19  Medication Management   Pt uses [facility] for all medications Uses pill box? No - doesn't need Pt endorses 100% compliance  We discussed: Current pharmacy is preferred with insurance plan and patient is satisfied with pharmacy services  Plan  Continue current medication management strategy  Follow up: 6 month office visit  [place] Pharmacist [facility]    [phone]

## 2019-12-12 NOTE — Progress Notes (Signed)
Chronic Care Management Pharmacy Assistant   Name: Jaryan Chicoine.  MRN: [id number] DOB: 12-18-1950  Reason for Encounter: Medication Review   PCP : Flossie Buffy, NP  Allergies:  No Known Allergies  Medications: Outpatient Encounter Medications as of 12/12/2019  Medication Sig Note  . allopurinol (ZYLOPRIM) 300 MG tablet Take 1 tablet (300 mg total) by mouth daily.   Marland Kitchen aspirin 81 MG tablet Take 81 mg by mouth daily.     . Blood Pressure Monitoring (B-D ASSURE BPM/AUTO ARM CUFF) MISC 1 Units by Does not apply route daily. Arm circumference: 40cm   . colchicine 0.6 MG tablet Take 0.5 tablets (0.3 mg total) by mouth daily as needed. 10/24/2019: As needed  . fenofibrate 54 MG tablet Take 1 tablet (54 mg total) by mouth daily.   . finasteride (PROSCAR) 5 MG tablet TAKE 1 TABLET BY MOUTH  DAILY   . irbesartan (AVAPRO) 300 MG tablet Take 1 tablet (300 mg total) by mouth daily.   Marland Kitchen loratadine (CLARITIN) 10 MG tablet Take 10 mg by mouth daily as needed for allergies.    . nabumetone (RELAFEN) 500 MG tablet TAKE 1 TABLET BY MOUTH  DAILY   . pantoprazole (PROTONIX) 40 MG tablet TAKE 1 TABLET BY MOUTH  DAILY   . pravastatin (PRAVACHOL) 20 MG tablet Take 1 tablet (20 mg total) by mouth daily.   . sildenafil (VIAGRA) 100 MG tablet Take 1 tablet (100 mg total) by mouth as needed for erectile dysfunction. Yearly physical w/labs due in February must see MD for refills   . tamsulosin (FLOMAX) 0.4 MG CAPS capsule Take 1 capsule (0.4 mg total) by mouth daily.   . verapamil (CALAN-SR) 240 MG CR tablet TAKE 1 TABLET BY MOUTH AT  BEDTIME    No facility-administered encounter medications on file as of 12/12/2019.    Current Diagnosis: Patient Active Problem List   Diagnosis Date Noted  . Monoclonal gammopathies 01/23/2019  . Iron deficiency anemia 09/20/2018  . Fever 07/03/2018  . Thrombocytopenia ([place]) 05/09/2018  . Degenerative arthritis of knee, bilateral 12/17/2017  . Degenerative  TFCC tear, right 11/08/2017  . Carpal tunnel syndrome on right 08/31/2017  . Degenerative joint disease of knee, left 08/03/2017  . Right wrist pain 07/17/2017  . Rash 05/08/2017  . Sinus bradycardia 05/04/2016  . Hyperglycemia 05/04/2016  . Low serum HDL 05/04/2016  . Hypertriglyceridemia 05/04/2016  . Left rotator cuff tear 11/23/2015  . Memory dysfunction 04/23/2015  . Bursitis of right shoulder 03/03/2015  . Pain of right side of body 01/16/2015  . Acute meniscal tear of left knee 12/10/2013  . Localized osteoarthrosis, lower leg 12/10/2013  . OSA (obstructive sleep apnea) 11/13/2013  . Peripheral edema 08/27/2013  . Diarrhea 08/27/2013  . Increased prostate specific antigen (PSA) velocity 04/02/2013  . Bilateral knee pain 11/13/2012  . Left knee pain 11/13/2012  . Chronic gout of knee due to drug 11/13/2012  . Urinary frequency 07/20/2012  . Urinary retention 07/20/2012  . Hip pain, acute 07/07/2012  . Preventative health care 02/28/2011  . SKIN LESION 05/26/2009  . LIBIDO, DECREASED 05/26/2009  . BACK PAIN 04/01/2009  . GOUT 10/30/2007  . Nocturia 09/04/2007  . FATIGUE 08/20/2007  . ALLERGIC RHINITIS 05/01/2007  . PERIPHERAL VASCULAR DISEASE 03/09/2007  . DIVERTICULOSIS, COLON 03/09/2007  . COLONIC POLYPS, HX OF 03/09/2007  . Essential hypertension 10/31/2006    Goals Addressed   None     Follow-Up:  Pharmacist Review  Have you seen any other providers since your last visit? no Any changes in your medications or health? no Any side effects from any medications? no Do you have an symptoms or problems not managed by your medications? no Any concerns about your health right now? no Has your provider asked that you check blood pressure, blood sugar, or follow special diet at home?   Patient states he does not check his blood pressure at home.  Patient reports he cooks at home without using salt.Patient states he may eat out once in awhile.  Do you get any  type of exercise on a regular basis? Yes  Patient states he goes to the gym "here and there". Can you think of a goal you would like to reach for your health? None ID Do you have any problems getting your medications? no Is there anything that you would like to discuss during the appointment? None ID   Please bring medications and supplements to appointment  [place] Pharmacist Assistant [phone]

## 2019-12-15 ENCOUNTER — Ambulatory Visit: Payer: Medicare Other

## 2019-12-15 ENCOUNTER — Other Ambulatory Visit: Payer: Self-pay

## 2019-12-15 VITALS — Ht 68.0 in | Wt 263.4 lb

## 2019-12-15 DIAGNOSIS — I10 Essential (primary) hypertension: Secondary | ICD-10-CM

## 2019-12-15 DIAGNOSIS — E782 Mixed hyperlipidemia: Secondary | ICD-10-CM

## 2019-12-15 NOTE — Patient Instructions (Signed)
Visit Information It was great speaking with you today!  Please let me know if you have any questions about our visit.  Goals Addressed            This Visit's Progress   . Chronic Care Management       CARE PLAN ENTRY (see longitudinal plan of care for additional care plan information)  Current Barriers:  . Chronic Disease Management support, education, and care coordination needs related to Hypertension, Hyperlipidemia, BPH, Gout and Peripheral Vascular Disease    Hypertension BP Readings from Last 3 Encounters:  11/24/19 130/72  10/24/19 (!) 141/76  09/24/19 140/78   . Pharmacist Clinical Goal(s): o Over the next 90 days, patient will work with PharmD and providers to achieve BP goal <130/80 . Current regimen:  . Irbesartan 300 mg daily  . Verapamil SR 240 mg nightly . Interventions: o Discussed low salt diet and exercising as tolerated extensively o Will initiate blood pressure monitoring plan  . Patient self care activities - Over the next 90 days, patient will: o Check Blood Pressure 1-2 times weekly, document, and provide at future appointments o Ensure daily salt intake < 2300 mg/day  Hyperlipidemia Lab Results  Component Value Date/Time   LDLCALC 123 (H) 04/23/2019 10:01 AM   LDLCALC 64 01/22/2019 02:21 PM   LDLDIRECT 61.0 11/26/2018 10:36 AM   . Pharmacist Clinical Goal(s): o Over the next 90 days, patient will work with PharmD and providers to achieve LDL goal < 70 . Current regimen:  . Fenofibrate 54 mg daily . Pravastatin 20 mg daily  . Interventions: o Discussed low cholesterol diet and exercising as tolerated extensively o Will initiate cholesterol monitoring plan    Medication management . Pharmacist Clinical Goal(s): o Over the next 90 days, patient will work with PharmD and providers to maintain optimal medication adherence . Current pharmacy: [facility] . Interventions o Comprehensive medication review performed. o Continue current  medication management strategy . Patient self care activities - Over the next 90 days, patient will: o Take medications as prescribed o Report any questions or concerns to PharmD and/or provider(s)       Mr. Stehlin was given information about Chronic Care Management services today including:  1. CCM service includes personalized support from designated clinical staff supervised by his physician, including individualized plan of care and coordination with other care providers 2. 24/7 contact phone numbers for assistance for urgent and routine care needs. 3. Standard insurance, coinsurance, copays and deductibles apply for chronic care management only during months in which we provide at least 20 minutes of these services. Most insurances cover these services at 100%, however patients may be responsible for any copay, coinsurance and/or deductible if applicable. This service may help you avoid the need for more expensive face-to-face services. 4. Only one practitioner may furnish and bill the service in a calendar month. 5. The patient may stop CCM services at any time (effective at the end of the month) by phone call to the office staff.  Patient agreed to services and verbal consent obtained.   The patient verbalized understanding of instructions provided today and agreed to receive a mailed copy of patient instruction and/or educational materials. Face to Face appointment with pharmacist scheduled for:  06/14/20 at 11:00 AM  [place] [facility] at [facility]  [phone]

## 2019-12-17 NOTE — Progress Notes (Signed)
I haven't seen him in a while. Nabumetone was prescribed by sports Medicine. I am on with change to prn, but discuss with Dr. Tamala Julian as well. Please advised to schedule f/up with me if he has not already done so. We will then repeat his labs. Thank you

## 2019-12-18 ENCOUNTER — Telehealth: Payer: Self-pay

## 2019-12-18 NOTE — Progress Notes (Signed)
Spoke with patient to informed per clinical pharmacist his PCP  was in agreement to decrease Nabumetone to only take as needed.Patient has a follow up visit with his PCP on 02/02/2020.  Patient Verbalized understanding.  Patient question if he should be taking pantoprazole. Patient states he stop taking nabumetone and pantoprazole after his visit with his clinical pharmacist.   Per clinical Pharmacist he has  not heard back from GI on the pantoprazole. His recommendation is that he continue on pantoprazole for now so he can get the doctor's approval, but if patient  is doing ok with no symptoms then that is fine as well.  Patient states he will take Pantoprazole and Nabumetone as needed and wait for [place] to contact him with the result from the GI.  [place] Pharmacist Assistant [phone]

## 2019-12-18 NOTE — Progress Notes (Signed)
Dr. Tamala Julian,  This was my first visit with him so I certainly don't have as clear of a picture regarding his gout, but based on my conversation with him he has been diligent with taking his allopurinol and trying dietary modification to minimize foods that trigger his gout. I think for now since his gout has been stable for a few months I would recommend he continue with allopurinol daily and keep colchicine and nabumetone on hand PRN for any flares he may experience.   Thanks, Cristie Hem

## 2019-12-18 NOTE — Progress Notes (Signed)
Bessie,  Please reach out to Mr. Banes and inform him that Baldo Ash was in agreement to decrease Nabumetone to only take as needed. Please also inform him to schedule a follow-up visit with Baldo Ash with the front office.    Thanks, Cristie Hem

## 2019-12-18 NOTE — Progress Notes (Signed)
Dr. Tamala Julian,  I spoke with your patient this week for chronic care management. I advised him that it would be a good idea to try and decrease how often he is using his nabumetone due to his CKD and bleeding risk. I wanted to inform you since that medication was prescribed by you.   Thank you for involving me in the care of your patient, Doristine Section Clinical Pharmacist [facility] at [facility]  [phone]

## 2019-12-18 NOTE — Telephone Encounter (Signed)
-----   Message from Germaine Pomfret, [facility] sent at 12/18/2019  8:32 AM EDT -----   ----- Message ----- From: Flossie Buffy, NP Sent: 12/17/2019   4:21 PM EDT To: Germaine Pomfret, [place]    ----- Message ----- From: Germaine Pomfret, [facility] Sent: 12/15/2019   2:47 PM EDT To: Flossie Buffy, NP  Baldo Ash, What would you think about having the patient stop his Nabumetone (or at least going to PRN use)? Given his history of gastritis and borderline CKD it makes me nervous that he is taking nabumetone daily and would prefer he try tylenol arthritis to manage his joint pain.Patient would likely benefit from a high intensity statin given his history of PVD and ASVCD risk >20%. He wanted to wait until getting repeat labwork before changing his cholesterol regimen. Please let me know your thoughts on these suggestions and I will relay them with the patient.Maximiano Coss [facility] [place]

## 2019-12-23 NOTE — Progress Notes (Signed)
Please let Mr. Addo know that I spoke with Dr. Henrene Pastor. He recommended the patient take pantoprazole daily because he is taking aspirin. His endoscopy showed gastritis and so the pantoprazole is not to prevent heartburn or reflux but to prevent stomach ulcers that can form when taking NSAIDs.   Thanks, Doristine Section Clinical Pharmacist [place] [facility] at [facility]  [phone]

## 2019-12-24 ENCOUNTER — Telehealth: Payer: Self-pay

## 2019-12-24 NOTE — Telephone Encounter (Signed)
-----   Message from Germaine Pomfret, [facility] sent at 12/23/2019 11:41 AM EDT -----   ----- Message ----- From: Irene Shipper, MD Sent: 12/22/2019  10:29 AM EDT To: Germaine Pomfret, RPH  If the patient is on daily aspirin, he should be on PPI.  Otherwise, as needed use would be fine. ----- Message ----- From: Germaine Pomfret, [facility] Sent: 12/22/2019   9:52 AM EDT To: Irene Shipper, MD  Dr. Henrene Pastor,  I spoke with this patient last week for chronic care management. The patient wanted to know if he needed to continue taking his pantoprazole daily. He was started on this medication on 11/19/18 after an endoscopy saw gastritis that was possible NSAID induced. He is no longer taking daily NSAIDs and has not had any problems with any GERD symptoms. Do you think at this point he still needs to take pantoprazole daily, or would it be ok to decrease it to PRN use?   Thank you for involving me in the care of your patient, Doristine Section Clinical Pharmacist [facility] at [facility]  [phone]

## 2019-12-24 NOTE — Progress Notes (Signed)
Informed patient per clinical pharmacist he spoke with Dr. Henrene Pastor. He recommended the patient take pantoprazole daily because he is taking aspirin. His endoscopy showed gastritis and so the pantoprazole is not to prevent heartburn or reflux but to prevent stomach ulcers that can form when taking NSAIDs.   Patient verbalized understanding  Anderson Malta Clinical Pharmacist Assistant [phone]

## 2020-01-20 ENCOUNTER — Telehealth: Payer: Self-pay

## 2020-01-20 DIAGNOSIS — I10 Essential (primary) hypertension: Secondary | ICD-10-CM

## 2020-01-20 NOTE — Progress Notes (Addendum)
Chronic Care Management Pharmacy Assistant   Name: Calvin [name].  MRN: [id number] DOB: May 21, 1950  Reason for [place] Call.  PCP : Flossie Buffy, NP  Allergies:  No Known Allergies  Medications: Outpatient Encounter Medications as of 01/20/2020  Medication Sig Note  . allopurinol (ZYLOPRIM) 300 MG tablet Take 1 tablet (300 mg total) by mouth daily.   Marland Kitchen aspirin 81 MG tablet Take 81 mg by mouth daily.     . Blood Pressure Monitoring (B-D ASSURE BPM/AUTO ARM CUFF) MISC 1 Units by Does not apply route daily. Arm circumference: 40cm   . colchicine 0.6 MG tablet Take 0.5 tablets (0.3 mg total) by mouth daily as needed. 10/24/2019: As needed  . fenofibrate 54 MG tablet Take 1 tablet (54 mg total) by mouth daily.   . finasteride (PROSCAR) 5 MG tablet TAKE 1 TABLET BY MOUTH  DAILY   . irbesartan (AVAPRO) 300 MG tablet Take 1 tablet (300 mg total) by mouth daily.   Marland Kitchen loratadine (CLARITIN) 10 MG tablet Take 10 mg by mouth daily as needed for allergies.  (Patient not taking: Reported on 12/15/2019)   . nabumetone (RELAFEN) 500 MG tablet TAKE 1 TABLET BY MOUTH  DAILY   . pantoprazole (PROTONIX) 40 MG tablet TAKE 1 TABLET BY MOUTH  DAILY   . pravastatin (PRAVACHOL) 20 MG tablet Take 1 tablet (20 mg total) by mouth daily.   . sildenafil (VIAGRA) 100 MG tablet Take 1 tablet (100 mg total) by mouth as needed for erectile dysfunction. Yearly physical w/labs due in February must see MD for refills (Patient not taking: Reported on 12/15/2019)   . tamsulosin (FLOMAX) 0.4 MG CAPS capsule Take 1 capsule (0.4 mg total) by mouth daily.   . verapamil (CALAN-SR) 240 MG CR tablet TAKE 1 TABLET BY MOUTH AT  BEDTIME    No facility-administered encounter medications on file as of 01/20/2020.    Current Diagnosis: Patient Active Problem List   Diagnosis Date Noted  . Monoclonal gammopathies 01/23/2019  . Iron deficiency anemia 09/20/2018  . Fever 07/03/2018  .  Thrombocytopenia ([place]) 05/09/2018  . Degenerative arthritis of knee, bilateral 12/17/2017  . Degenerative TFCC tear, right 11/08/2017  . Carpal tunnel syndrome on right 08/31/2017  . Degenerative joint disease of knee, left 08/03/2017  . Right wrist pain 07/17/2017  . Rash 05/08/2017  . Sinus bradycardia 05/04/2016  . Hyperglycemia 05/04/2016  . Low serum HDL 05/04/2016  . Hypertriglyceridemia 05/04/2016  . Left rotator cuff tear 11/23/2015  . Memory dysfunction 04/23/2015  . Bursitis of right shoulder 03/03/2015  . Pain of right side of body 01/16/2015  . Acute meniscal tear of left knee 12/10/2013  . Localized osteoarthrosis, lower leg 12/10/2013  . OSA (obstructive sleep apnea) 11/13/2013  . Peripheral edema 08/27/2013  . Diarrhea 08/27/2013  . Increased prostate specific antigen (PSA) velocity 04/02/2013  . Bilateral knee pain 11/13/2012  . Left knee pain 11/13/2012  . Chronic gout of knee due to drug 11/13/2012  . Urinary frequency 07/20/2012  . Urinary retention 07/20/2012  . Hip pain, acute 07/07/2012  . Preventative health care 02/28/2011  . SKIN LESION 05/26/2009  . LIBIDO, DECREASED 05/26/2009  . BACK PAIN 04/01/2009  . GOUT 10/30/2007  . Nocturia 09/04/2007  . FATIGUE 08/20/2007  . ALLERGIC RHINITIS 05/01/2007  . PERIPHERAL VASCULAR DISEASE 03/09/2007  . DIVERTICULOSIS, COLON 03/09/2007  . COLONIC POLYPS, HX OF 03/09/2007  . Essential hypertension 10/31/2006    Follow-Up:  Pharmacist Review   Reviewed chart prior to disease state call. Spoke with patient regarding BP  Recent Office Vitals: BP Readings from Last 3 Encounters:  11/24/19 130/72  10/24/19 (!) 141/76  09/24/19 140/78   Pulse Readings from Last 3 Encounters:  11/24/19 60  10/24/19 60  09/24/19 (!) 59    Wt Readings from Last 3 Encounters:  12/15/19 263 lb 6.4 oz (119.5 kg)  11/24/19 266 lb (120.7 kg)  10/24/19 272 lb 4.8 oz (123.5 kg)     Kidney Function Lab Results  Component  Value Date/Time   CREATININE 1.53 (H) 10/20/2019 10:09 AM   CREATININE 1.45 (H) 04/30/2019 10:43 AM   GFR 57.19 (L) 04/23/2019 10:01 AM   GFRNONAA 45 (L) 10/20/2019 10:09 AM   GFRAA 52 (L) 10/20/2019 10:09 AM    BMP Latest Ref Rng & Units 10/20/2019 04/30/2019 04/23/2019  Glucose 70 - 99 mg/dL 117(H) 87 88  BUN 8 - 23 mg/dL 16 16 16   Creatinine 0.61 - 1.24 mg/dL 1.53(H) 1.45(H) 1.47  Sodium 135 - 145 mmol/L 142 143 141  Potassium 3.5 - 5.1 mmol/L 3.9 3.8 3.7  Chloride 98 - 111 mmol/L 109 109 106  CO2 22 - 32 mmol/L 24 27 28   Calcium 8.9 - 10.3 mg/dL 9.4 8.9 9.3    . Current antihypertensive regimen:   Irbesartan 300 mg daily   Verapamil SR 240 mg nightly . How often are you checking your Blood Pressure? daily . Current home BP readings:  o On 12/15/19 it was 140/89 o On 12/16/19 it was 148/97, later that day it was 139/88. o On 12/17/19 it was 152/86, later that day it was 145/85. o On 12/19/19 it was 139/86. o On 12/20/19 it was 154/93, later that day it was 146/90. o On 12/22/19 it was 153/93. o On 12/23/19 it was 147/87.  Patient states he check his blood pressure while sitting down without talking with his feet uncross on his upper left arm.Patient states he does not sit and relax for ten minutes.I explained to the patient he should sit in a chair with your feet on the floor and his arm supported so his elbow is at about heart level,try not to talk during the measurement, and rest at least 10 to 15 minutes before check his blood pressure .  Marland Kitchen What recent interventions/DTPs have been made by any provider to improve Blood Pressure control since last CPP Visit: None ID . Any recent hospitalizations or ED visits since last visit with CPP? No . What diet changes have been made to improve Blood Pressure Control?   Patient reports he cooks at home without using salt.  Patient states he may eat out once in awhile. . What exercise is being done to improve your Blood Pressure Control?    o   Patient states he goes to the gym "here and there".  Adherence Review: Is the patient currently on ACE/ARB medication? Yes Does the patient have >5 day gap between last estimated fill dates? Yes   Joycelyn Schmid Clinical Pharmacist Assistant [phone]    Addendum 01/29/20:  Average BP 148/90, above goal of <130/80. Patient has previously tried amlodipine and HCTZ. Will defer to PCP given follow-up on 11/22. Will plan for repeat BP assessment in one month.   Doristine Section Clinical Pharmacist [place] [facility] at [facility]  [phone]

## 2020-02-02 ENCOUNTER — Encounter: Payer: Self-pay | Admitting: Nurse Practitioner

## 2020-02-02 ENCOUNTER — Ambulatory Visit (INDEPENDENT_AMBULATORY_CARE_PROVIDER_SITE_OTHER): Payer: Medicare Other | Admitting: Nurse Practitioner

## 2020-02-02 ENCOUNTER — Other Ambulatory Visit: Payer: Self-pay

## 2020-02-02 VITALS — BP 136/80 | HR 70 | Temp 97.3°F | Ht 67.5 in | Wt 267.6 lb

## 2020-02-02 DIAGNOSIS — M1A0620 Idiopathic chronic gout, left knee, without tophus (tophi): Secondary | ICD-10-CM

## 2020-02-02 DIAGNOSIS — I872 Venous insufficiency (chronic) (peripheral): Secondary | ICD-10-CM

## 2020-02-02 DIAGNOSIS — I10 Essential (primary) hypertension: Secondary | ICD-10-CM

## 2020-02-02 DIAGNOSIS — E669 Obesity, unspecified: Secondary | ICD-10-CM | POA: Insufficient documentation

## 2020-02-02 DIAGNOSIS — I739 Peripheral vascular disease, unspecified: Secondary | ICD-10-CM

## 2020-02-02 LAB — BASIC METABOLIC PANEL
BUN: 19 mg/dL (ref 6–23)
CO2: 27 mEq/L (ref 19–32)
Calcium: 9.1 mg/dL (ref 8.4–10.5)
Chloride: 108 mEq/L (ref 96–112)
Creatinine, Ser: 1.43 mg/dL (ref 0.40–1.50)
GFR: 49.31 mL/min — ABNORMAL LOW (ref >60.00)
Glucose, Bld: 85 mg/dL (ref 70–99)
Potassium: 4 mEq/L (ref 3.5–5.1)
Sodium: 142 mEq/L (ref 135–145)

## 2020-02-02 LAB — URIC ACID: Uric Acid, Serum: 4.4 mg/dL (ref 4.0–7.8)

## 2020-02-02 NOTE — Patient Instructions (Signed)
Go to lab for repeat uric acid and BMP  Maintain current medications

## 2020-02-02 NOTE — Progress Notes (Signed)
Subjective:  Patient ID: Calvin [name]., male    DOB: December 28, 1950  Age: 69 y.o. MRN: [id number]  CC: Follow-up (6 month f/u on HTN )  HPI  Venous insufficiency of both lower extremities Normal pedal pulses and equal, Trace LE edema (non-piting), normal skin temperature and toenails. No claudication. No need for diuretic at this time Advised about maintain DASH diet and exercise at least 3x/week.  Essential hypertension BP at goal Current use of irbesartan and verapamil Reports higher numbers at home BP Readings from Last 3 Encounters:  02/02/20 136/80  11/24/19 130/72  10/24/19 (!) 141/76   Repeat BMP Maintain current medications  Obesity, morbid ([place]) He has made changes to his diet (low carb and sugar), but aware about need to resume regular exercise.  GOUT reports 2 gout flares on heel and wrist within last 37months. Resolved with use of voltaren gel and colchicine 0.5mg  daily x 3days, then stopped. No longer taking Relafen due to concerns about renal function. Daily use of allopurinol 300mg  daily  Repeat uric acid Maintain daily allopurinol and prn colchicine Continue to hold relafen Advised to avoid using colchine and voltaren gel at same time.   Wt Readings from Last 3 Encounters:  02/02/20 267 lb 9.6 oz (121.4 kg)  12/15/19 263 lb 6.4 oz (119.5 kg)  11/24/19 266 lb (120.7 kg)   Reviewed past Medical, Social and Family history today.  Outpatient Medications Prior to Visit  Medication Sig Dispense Refill  . allopurinol (ZYLOPRIM) 300 MG tablet Take 1 tablet (300 mg total) by mouth daily. 90 tablet 3  . aspirin 81 MG tablet Take 81 mg by mouth daily.      . Blood Pressure Monitoring (B-D ASSURE BPM/AUTO ARM CUFF) MISC 1 Units by Does not apply route daily. Arm circumference: 40cm 1 each 0  . fenofibrate 54 MG tablet Take 1 tablet (54 mg total) by mouth daily. 90 tablet 3  . finasteride (PROSCAR) 5 MG tablet TAKE 1 TABLET BY MOUTH  DAILY 90 tablet 3  .  irbesartan (AVAPRO) 300 MG tablet Take 1 tablet (300 mg total) by mouth daily. 90 tablet 3  . pantoprazole (PROTONIX) 40 MG tablet TAKE 1 TABLET BY MOUTH  DAILY 90 tablet 1  . pravastatin (PRAVACHOL) 20 MG tablet Take 1 tablet (20 mg total) by mouth daily. 90 tablet 3  . sildenafil (VIAGRA) 100 MG tablet Take 1 tablet (100 mg total) by mouth as needed for erectile dysfunction. Yearly physical w/labs due in February must see MD for refills 10 tablet 0  . tamsulosin (FLOMAX) 0.4 MG CAPS capsule Take 1 capsule (0.4 mg total) by mouth daily. 90 capsule 3  . verapamil (CALAN-SR) 240 MG CR tablet TAKE 1 TABLET BY MOUTH AT  BEDTIME 90 tablet 3  . colchicine 0.6 MG tablet Take 0.5 tablets (0.3 mg total) by mouth daily as needed. (Patient not taking: Reported on 02/02/2020) 45 tablet 0  . loratadine (CLARITIN) 10 MG tablet Take 10 mg by mouth daily as needed for allergies.  (Patient not taking: Reported on 02/02/2020)    . nabumetone (RELAFEN) 500 MG tablet TAKE 1 TABLET BY MOUTH  DAILY (Patient not taking: Reported on 02/02/2020) 90 tablet 3   No facility-administered medications prior to visit.    ROS See HPI  Objective:  BP 136/80 (BP Location: Left Arm, Patient Position: Sitting, Cuff Size: Large)   Pulse 70   Temp (!) 97.3 F (36.3 C) (Temporal)   Ht 5' 7.5" (  1.715 m)   Wt 267 lb 9.6 oz (121.4 kg)   SpO2 99%   BMI 41.29 kg/m   Physical Exam Constitutional:      Appearance: He is obese.  Cardiovascular:     Rate and Rhythm: Normal rate and regular rhythm.     Pulses: Normal pulses.     Heart sounds: Normal heart sounds.     Comments: Normal distal pulses. Pulmonary:     Effort: Pulmonary effort is normal.     Breath sounds: Normal breath sounds.  Musculoskeletal:        General: No tenderness.     Comments: Trace LE edema  Skin:    Findings: No erythema or rash.  Neurological:     Mental Status: He is alert and oriented to person, place, and time.    Assessment & Plan:    This visit occurred during the SARS-CoV-2 public health emergency.  Safety protocols were in place, including screening questions prior to the visit, additional usage of staff PPE, and extensive cleaning of exam room while observing appropriate contact time as indicated for disinfecting solutions.   Calvin [name] was seen today for follow-up.  Diagnoses and all orders for this visit:  Essential hypertension -     Basic metabolic panel  Obesity, morbid ([place])  Venous insufficiency of both lower extremities  Chronic gout of left knee, unspecified cause -     Uric acid -     Basic metabolic panel  Advised about need for regular exercise.  Problem List Items Addressed This Visit      Cardiovascular and Mediastinum   Essential hypertension - Primary    BP at goal Current use of irbesartan and verapamil Reports higher numbers at home BP Readings from Last 3 Encounters:  02/02/20 136/80  11/24/19 130/72  10/24/19 (!) 141/76   Repeat BMP Maintain current medications      Relevant Orders   Basic metabolic panel (Completed)   Venous insufficiency of both lower extremities    Normal pedal pulses and equal, Trace LE edema (non-piting), normal skin temperature and toenails. No claudication. No need for diuretic at this time Advised about maintain DASH diet and exercise at least 3x/week.        Other   GOUT    reports 2 gout flares on heel and wrist within last 26months. Resolved with use of voltaren gel and colchicine 0.5mg  daily x 3days, then stopped. No longer taking Relafen due to concerns about renal function. Daily use of allopurinol 300mg  daily  Repeat uric acid Maintain daily allopurinol and prn colchicine Continue to hold relafen Advised to avoid using colchine and voltaren gel at same time.       Relevant Orders   Uric acid (Completed)   Basic metabolic panel (Completed)   Obesity, morbid ([place])    He has made changes to his diet (low carb and sugar), but aware about  need to resume regular exercise.         Follow-up: Return in about 6 months (around 08/01/2020) for HTN, hyperlipidemia, hyperglycemia (f2F, 68mins).  Wilfred Lacy, NP

## 2020-02-03 NOTE — Assessment & Plan Note (Signed)
BP at goal Current use of irbesartan and verapamil Reports higher numbers at home BP Readings from Last 3 Encounters:  02/02/20 136/80  11/24/19 130/72  10/24/19 (!) 141/76   Repeat BMP Maintain current medications

## 2020-02-03 NOTE — Assessment & Plan Note (Addendum)
Normal pedal pulses and equal, Trace LE edema (non-piting), normal skin temperature and toenails. No claudication. No need for diuretic at this time Advised about maintain DASH diet and exercise at least 3x/week.

## 2020-02-03 NOTE — Assessment & Plan Note (Signed)
reports 2 gout flares on heel and wrist within last 40months. Resolved with use of voltaren gel and colchicine 0.5mg  daily x 3days, then stopped. No longer taking Relafen due to concerns about renal function. Daily use of allopurinol 300mg  daily  Repeat uric acid Maintain daily allopurinol and prn colchicine Continue to hold relafen Advised to avoid using colchine and voltaren gel at same time.

## 2020-02-03 NOTE — Telephone Encounter (Signed)
Thank you for the update, I'm glad his blood pressure was better in office today. I will continue to monitor his blood pressure and let you know if I notice any other abnormal findings!   Thanks, Doristine Section Clinical Pharmacist [place] [facility] at [facility]  [phone]

## 2020-02-03 NOTE — Assessment & Plan Note (Signed)
He has made changes to his diet (low carb and sugar), but aware about need to resume regular exercise.

## 2020-02-18 ENCOUNTER — Telehealth: Payer: Self-pay

## 2020-02-18 NOTE — Progress Notes (Signed)
Chronic Care Management Pharmacy Assistant   Name: Calvin [name].  MRN: [id number] DOB: 10-Jul-1950  Reason for [place] Call.  PCP : Flossie Buffy, NP  Allergies:  No Known Allergies  Medications: Outpatient Encounter Medications as of 02/18/2020  Medication Sig  . allopurinol (ZYLOPRIM) 300 MG tablet Take 1 tablet (300 mg total) by mouth daily.  Marland Kitchen aspirin 81 MG tablet Take 81 mg by mouth daily.    . Blood Pressure Monitoring (B-D ASSURE BPM/AUTO ARM CUFF) MISC 1 Units by Does not apply route daily. Arm circumference: 40cm  . fenofibrate 54 MG tablet Take 1 tablet (54 mg total) by mouth daily.  . finasteride (PROSCAR) 5 MG tablet TAKE 1 TABLET BY MOUTH  DAILY  . irbesartan (AVAPRO) 300 MG tablet Take 1 tablet (300 mg total) by mouth daily.  . pantoprazole (PROTONIX) 40 MG tablet TAKE 1 TABLET BY MOUTH  DAILY  . pravastatin (PRAVACHOL) 20 MG tablet Take 1 tablet (20 mg total) by mouth daily.  . sildenafil (VIAGRA) 100 MG tablet Take 1 tablet (100 mg total) by mouth as needed for erectile dysfunction. Yearly physical w/labs due in February must see MD for refills  . tamsulosin (FLOMAX) 0.4 MG CAPS capsule Take 1 capsule (0.4 mg total) by mouth daily.  . verapamil (CALAN-SR) 240 MG CR tablet TAKE 1 TABLET BY MOUTH AT  BEDTIME   No facility-administered encounter medications on file as of 02/18/2020.    Current Diagnosis: Patient Active Problem List   Diagnosis Date Noted  . Obesity, morbid ([place]) 02/02/2020  . Monoclonal gammopathies 01/23/2019  . Iron deficiency anemia 09/20/2018  . Fever 07/03/2018  . Thrombocytopenia ([place]) 05/09/2018  . Degenerative arthritis of knee, bilateral 12/17/2017  . Degenerative TFCC tear, right 11/08/2017  . Carpal tunnel syndrome on right 08/31/2017  . Degenerative joint disease of knee, left 08/03/2017  . Right wrist pain 07/17/2017  . Rash 05/08/2017  . Sinus bradycardia 05/04/2016  . Hyperglycemia  05/04/2016  . Low serum HDL 05/04/2016  . Hypertriglyceridemia 05/04/2016  . Left rotator cuff tear 11/23/2015  . Memory dysfunction 04/23/2015  . Bursitis of right shoulder 03/03/2015  . Pain of right side of body 01/16/2015  . Acute meniscal tear of left knee 12/10/2013  . Localized osteoarthrosis, lower leg 12/10/2013  . OSA (obstructive sleep apnea) 11/13/2013  . Peripheral edema 08/27/2013  . Diarrhea 08/27/2013  . Increased prostate specific antigen (PSA) velocity 04/02/2013  . Bilateral knee pain 11/13/2012  . Left knee pain 11/13/2012  . Chronic gout of knee due to drug 11/13/2012  . Urinary frequency 07/20/2012  . Urinary retention 07/20/2012  . Hip pain, acute 07/07/2012  . Preventative health care 02/28/2011  . SKIN LESION 05/26/2009  . LIBIDO, DECREASED 05/26/2009  . BACK PAIN 04/01/2009  . GOUT 10/30/2007  . Nocturia 09/04/2007  . FATIGUE 08/20/2007  . ALLERGIC RHINITIS 05/01/2007  . Venous insufficiency of both lower extremities 03/09/2007  . DIVERTICULOSIS, COLON 03/09/2007  . COLONIC POLYPS, HX OF 03/09/2007  . Essential hypertension 10/31/2006    Follow-Up:  Pharmacist Review   Reviewed chart prior to disease state call. Spoke with patient regarding BP  Recent Office Vitals: BP Readings from Last 3 Encounters:  02/02/20 136/80  11/24/19 130/72  10/24/19 (!) 141/76   Pulse Readings from Last 3 Encounters:  02/02/20 70  11/24/19 60  10/24/19 60    Wt Readings from Last 3 Encounters:  02/02/20 267 lb 9.6 oz (121.4 kg)  12/15/19 263 lb 6.4 oz (119.5 kg)  11/24/19 266 lb (120.7 kg)     Kidney Function Lab Results  Component Value Date/Time   CREATININE 1.43 02/02/2020 10:55 AM   CREATININE 1.53 (H) 10/20/2019 10:09 AM   CREATININE 1.45 (H) 04/30/2019 10:43 AM   GFR 49.31 (L) 02/02/2020 10:55 AM   GFRNONAA 45 (L) 10/20/2019 10:09 AM   GFRAA 52 (L) 10/20/2019 10:09 AM    BMP Latest Ref Rng & Units 02/02/2020 10/20/2019 04/30/2019  Glucose 70 -  99 mg/dL 85 117(H) 87  BUN 6 - 23 mg/dL 19 16 16   Creatinine 0.40 - 1.50 mg/dL 1.43 1.53(H) 1.45(H)  Sodium 135 - 145 mEq/L 142 142 143  Potassium 3.5 - 5.1 mEq/L 4.0 3.9 3.8  Chloride 96 - 112 mEq/L 108 109 109  CO2 19 - 32 mEq/L 27 24 27   Calcium 8.4 - 10.5 mg/dL 9.1 9.4 8.9    . Current antihypertensive regimen:   Irbesartan 300 mg daily   Verapamil SR 240 mg nightly . How often are you checking your Blood Pressure? Patient states he stop taking his blood pressure at home because his montior is not working correctly, and feels his blood pressure is normal.   o Patient states he has been to three different doctors and his readings have been in normal range compared to his monitor it has not.Patient reports he was told it would be okay to stop checking his blood pressure at home.Patient states the provider told him it could be his cuff is to small ,and giving false readings.(patient states he has been to his PCP, urology and another provider with normal readings). o Patient states when he was checking his blood pressure he would be sitting down without talking with his feet uncross on his upper left arm.  . Current home BP readings: None ID . What recent interventions/DTPs have been made by any provider to improve Blood Pressure control since last CPP Visit: None ID  . Any recent hospitalizations or ED visits since last visit with CPP? No . What diet changes have been made to improve Blood Pressure Control?    Patient reports he cooks at home without using salt (low carb and sugar).             Patient states he may eat out once in awhile . What exercise is being done to improve your Blood Pressure Control?  ? Patient states he goes to the gym "here and there".  Patient ask if we had more information about the grab bar. Per clinical pharmacist not yet, do not have  the resource to give him at this time , but he could contact his PCP that may help.  Adherence Review: Is the patient  currently on ACE/ARB medication? Yes Does the patient have >5 day gap between last estimated fill dates? Yes   [place] Pharmacist Assistant [phone]

## 2020-02-24 DIAGNOSIS — G4733 Obstructive sleep apnea (adult) (pediatric): Secondary | ICD-10-CM | POA: Diagnosis not present

## 2020-03-02 DIAGNOSIS — Z03818 Encounter for observation for suspected exposure to other biological agents ruled out: Secondary | ICD-10-CM | POA: Diagnosis not present

## 2020-03-11 ENCOUNTER — Other Ambulatory Visit: Payer: Self-pay | Admitting: Nurse Practitioner

## 2020-03-11 ENCOUNTER — Telehealth: Payer: Self-pay

## 2020-03-11 NOTE — Progress Notes (Signed)
    Chronic Care Management Pharmacy Assistant   Name: Calvin [name].  MRN: [id number] DOB: March 22, 1950  Reason for Encounter: Medication Review  PCP : Anne Ng, NP  Allergies:  No Known Allergies  Medications: Outpatient Encounter Medications as of 03/11/2020  Medication Sig  . allopurinol (ZYLOPRIM) 300 MG tablet Take 1 tablet (300 mg total) by mouth daily.  Marland Kitchen aspirin 81 MG tablet Take 81 mg by mouth daily.    . Blood Pressure Monitoring (B-D ASSURE BPM/AUTO ARM CUFF) MISC 1 Units by Does not apply route daily. Arm circumference: 40cm  . fenofibrate 54 MG tablet Take 1 tablet (54 mg total) by mouth daily.  . finasteride (PROSCAR) 5 MG tablet TAKE 1 TABLET BY MOUTH  DAILY  . irbesartan (AVAPRO) 300 MG tablet Take 1 tablet (300 mg total) by mouth daily.  . pantoprazole (PROTONIX) 40 MG tablet TAKE 1 TABLET BY MOUTH  DAILY  . pravastatin (PRAVACHOL) 20 MG tablet Take 1 tablet (20 mg total) by mouth daily.  . sildenafil (VIAGRA) 100 MG tablet Take 1 tablet (100 mg total) by mouth as needed for erectile dysfunction. Yearly physical w/labs due in February must see MD for refills  . tamsulosin (FLOMAX) 0.4 MG CAPS capsule Take 1 capsule (0.4 mg total) by mouth daily.  . verapamil (CALAN-SR) 240 MG CR tablet TAKE 1 TABLET BY MOUTH AT  BEDTIME   No facility-administered encounter medications on file as of 03/11/2020.    Current Diagnosis: Patient Active Problem List   Diagnosis Date Noted  . Obesity, morbid (HCC) 02/02/2020  . Monoclonal gammopathies 01/23/2019  . Iron deficiency anemia 09/20/2018  . Fever 07/03/2018  . Thrombocytopenia (HCC) 05/09/2018  . Degenerative arthritis of knee, bilateral 12/17/2017  . Degenerative TFCC tear, right 11/08/2017  . Carpal tunnel syndrome on right 08/31/2017  . Degenerative joint disease of knee, left 08/03/2017  . Right wrist pain 07/17/2017  . Rash 05/08/2017  . Sinus bradycardia 05/04/2016  . Hyperglycemia 05/04/2016  .  Low serum HDL 05/04/2016  . Hypertriglyceridemia 05/04/2016  . Left rotator cuff tear 11/23/2015  . Memory dysfunction 04/23/2015  . Bursitis of right shoulder 03/03/2015  . Pain of right side of body 01/16/2015  . Acute meniscal tear of left knee 12/10/2013  . Localized osteoarthrosis, lower leg 12/10/2013  . OSA (obstructive sleep apnea) 11/13/2013  . Peripheral edema 08/27/2013  . Diarrhea 08/27/2013  . Increased prostate specific antigen (PSA) velocity 04/02/2013  . Bilateral knee pain 11/13/2012  . Left knee pain 11/13/2012  . Chronic gout of knee due to drug 11/13/2012  . Urinary frequency 07/20/2012  . Urinary retention 07/20/2012  . Hip pain, acute 07/07/2012  . Preventative health care 02/28/2011  . SKIN LESION 05/26/2009  . LIBIDO, DECREASED 05/26/2009  . BACK PAIN 04/01/2009  . GOUT 10/30/2007  . Nocturia 09/04/2007  . FATIGUE 08/20/2007  . ALLERGIC RHINITIS 05/01/2007  . Venous insufficiency of both lower extremities 03/09/2007  . DIVERTICULOSIS, COLON 03/09/2007  . COLONIC POLYPS, HX OF 03/09/2007  . Essential hypertension 10/31/2006    Goals Addressed   None       Reviewed chart and adherence measures. Per insurance data patient is 100 % adherent to Pravastatin and 80% adherent to irbesartan, will notify Angelena Sole, Clinical pharmacist.    Everlean Cherry Clinical Pharmacist Assistant [phone]   Follow-Up:  Pharmacist Review

## 2020-03-17 NOTE — Telephone Encounter (Signed)
Last OV 02/02/20 Last fill 01/02/20  #180/1 

## 2020-03-23 ENCOUNTER — Ambulatory Visit: Payer: Medicare Other | Admitting: Adult Health

## 2020-03-23 ENCOUNTER — Other Ambulatory Visit: Payer: Self-pay

## 2020-03-23 ENCOUNTER — Encounter: Payer: Self-pay | Admitting: Adult Health

## 2020-03-23 DIAGNOSIS — G4733 Obstructive sleep apnea (adult) (pediatric): Secondary | ICD-10-CM

## 2020-03-23 NOTE — Assessment & Plan Note (Addendum)
Excellent compliance and control on CPAP  CPAP is old and needs replacement   Plan  Patient Instructions  Order for new CPAP machine same setting.  Continue on CPAP At bedtime   Work on healthy weight .  Do not drive if sleepy .  Follow up with Dr. Halford Chessman  In 1 year and As needed

## 2020-03-23 NOTE — Addendum Note (Signed)
Addended by: Vanessa Barbara on: 03/23/2020 11:45 AM   Modules accepted: Orders

## 2020-03-23 NOTE — Assessment & Plan Note (Signed)
Work on healthy weight loss 

## 2020-03-23 NOTE — Progress Notes (Signed)
@Patient  ID: Calvin Broad., male    DOB: [birth year], 70 y.o.   MRN: [id number]  Chief Complaint  Patient presents with  . Follow-up  . Sleep Apnea    Referring provider: Flossie Buffy, NP  HPI: 70 year old male followed for obstructive sleep apnea  TEST/EVENTS :  HST 12/29/13 >> AHI 62.8, SaO2 low 71%. Auto CPAP6/01/20 to 09/10/18 >> used on 30 of 30 nights with average 7 hrs 33 min. Average AHI 2.4 with median CPAP 10 and 95 th percentile CPAP 13 cm H2O  03/23/2020 Follow up : OSA  Patient presents for a follow-up for sleep apnea.  Patient says he is doing well on CPAP.  He feels rested with no significant daytime sleepiness.  Feels that he benefits from CPAP.  Patient says his machine is getting old and he needs a new machine.  CPAP download shows excellent compliance with daily average usage at 8  hours.  Patient is on auto CPAP 5 to 20 cm H2O.  AHI is 2.2/hr  We discussed healthy weight loss. Tries to stay active.  Weight is down 20lbs.  Uses a full face mask.   No Known Allergies  Immunization History  Administered Date(s) Administered  . Fluad Quad(high Dose 65+) 11/26/2018  . H1N1 03/30/2008  . Influenza Split 01/18/2012, 11/11/2013  . Influenza Whole 01/14/2007, 01/11/2009  . Influenza, High Dose Seasonal PF 01/03/2016  . Influenza-Unspecified 02/02/2015, 01/04/2017, 01/02/2018, 12/08/2019  . PFIZER SARS-COV-2 Vaccination 04/19/2019, 05/12/2019, 12/08/2019  . Pneumococcal Conjugate-13 08/27/2013  . Pneumococcal Polysaccharide-23 12/10/2013  . Td 10/19/2007  . Tdap 05/09/2018  . Zoster 04/01/2009  . Zoster Recombinat (Shingrix) 01/22/2019, 06/24/2019    Past Medical History:  Diagnosis Date  . Allergy   . Arthritis   . Diverticulosis   . Gout   . Hyperlipidemia   . Hypertension   . Iron deficiency anemia 09/20/2018  . OSA (obstructive sleep apnea) 11/13/2013  . Sickle cell trait ([place])   . Sleep apnea     Tobacco History: Social History    Tobacco Use  Smoking Status Former Smoker  . Packs/day: 2.00  . Years: 5.00  . Pack years: 10.00  . Types: Cigarettes  . Quit date: 05/23/1977  . Years since quitting: 42.8  Smokeless Tobacco Never Used   Counseling given: Not Answered   Outpatient Medications Prior to Visit  Medication Sig Dispense Refill  . allopurinol (ZYLOPRIM) 300 MG tablet Take 1 tablet (300 mg total) by mouth daily. 90 tablet 3  . aspirin 81 MG tablet Take 81 mg by mouth daily.    . Blood Pressure Monitoring (B-D ASSURE BPM/AUTO ARM CUFF) MISC 1 Units by Does not apply route daily. Arm circumference: 40cm 1 each 0  . fenofibrate 54 MG tablet Take 1 tablet (54 mg total) by mouth daily. 90 tablet 3  . finasteride (PROSCAR) 5 MG tablet TAKE 1 TABLET BY MOUTH  DAILY 90 tablet 3  . irbesartan (AVAPRO) 300 MG tablet Take 1 tablet (300 mg total) by mouth daily. 90 tablet 3  . pantoprazole (PROTONIX) 40 MG tablet TAKE 1 TABLET BY MOUTH  DAILY 90 tablet 1  . pravastatin (PRAVACHOL) 20 MG tablet Take 1 tablet (20 mg total) by mouth daily. 90 tablet 3  . sildenafil (VIAGRA) 100 MG tablet Take 1 tablet (100 mg total) by mouth as needed for erectile dysfunction. Yearly physical w/labs due in February must see MD for refills 10 tablet 0  . tamsulosin (FLOMAX) 0.4 MG  CAPS capsule Take 1 capsule (0.4 mg total) by mouth daily. 90 capsule 3  . verapamil (CALAN-SR) 240 MG CR tablet TAKE 1 TABLET BY MOUTH AT  BEDTIME 90 tablet 3   No facility-administered medications prior to visit.     Review of Systems:   Constitutional:   No  weight loss, night sweats,  Fevers, chills, fatigue, or  lassitude.  HEENT:   No headaches,  Difficulty swallowing,  Tooth/dental problems, or  Sore throat,                No sneezing, itching, ear ache, nasal congestion, post nasal drip,   CV:  No chest pain,  Orthopnea, PND, swelling in lower extremities, anasarca, dizziness, palpitations, syncope.   GI  No heartburn, indigestion, abdominal  pain, nausea, vomiting, diarrhea, change in bowel habits, loss of appetite, bloody stools.   Resp: No shortness of breath with exertion or at rest.  No excess mucus, no productive cough,  No non-productive cough,  No coughing up of blood.  No change in color of mucus.  No wheezing.  No chest wall deformity  Skin: no rash or lesions.  GU: no dysuria, change in color of urine, no urgency or frequency.  No flank pain, no hematuria   MS:  No joint pain or swelling.  No decreased range of motion.  No back pain.    Physical Exam  BP (!) 150/90 (BP Location: Left Arm, Patient Position: Sitting, Cuff Size: Large)   Pulse 74   Temp (!) 97.4 F (36.3 C) (Temporal)   Ht 5\' 8"  (1.727 m)   Wt 263 lb 9.6 oz (119.6 kg)   SpO2 100%   BMI 40.08 kg/m   GEN: A/Ox3; pleasant , NAD, well nourished    HEENT:  [place]/AT,    NOSE-clear, THROAT-clear, no lesions, no postnasal drip or exudate noted. Class 3 MP airway   NECK:  Supple w/ fair ROM; no JVD; normal carotid impulses w/o bruits; no thyromegaly or nodules palpated; no lymphadenopathy.    RESP  Clear  P & A; w/o, wheezes/ rales/ or rhonchi. no accessory muscle use, no dullness to percussion  CARD:  RRR, no m/r/g, no peripheral edema, pulses intact, no cyanosis or clubbing.  GI:   Soft & nt; nml bowel sounds; no organomegaly or masses detected.   Musco: Warm bil, no deformities or joint swelling noted.   Neuro: alert, no focal deficits noted.    Skin: Warm, no lesions or rashes    Lab Results:  CBC   BNP No results found for: BNP  ProBNP No results found for: PROBNP  Imaging: No results found.    No flowsheet data found.  No results found for: NITRICOXIDE      Assessment & Plan:   OSA (obstructive sleep apnea) Excellent compliance and control on CPAP  CPAP is old and needs replacement   Plan  Patient Instructions  Order for new CPAP machine same setting.  Continue on CPAP At bedtime   Work on healthy weight .   Do not drive if sleepy .  Follow up with Dr. Halford Chessman  In 1 year and As needed       Obesity, morbid ([place]) Work on healthy weight loss.      Rexene Edison, NP 03/23/2020

## 2020-03-23 NOTE — Patient Instructions (Addendum)
Order for new CPAP machine same setting.  Continue on CPAP At bedtime   Work on healthy weight .  Do not drive if sleepy .  Follow up with Dr. Halford Chessman  In 1 year and As needed

## 2020-03-23 NOTE — Progress Notes (Signed)
Reviewed and agree with assessment/plan.   Tanija Germani, MD [facility] 03/23/2020, 12:48 PM Pager:  [phone]  

## 2020-04-19 ENCOUNTER — Other Ambulatory Visit: Payer: Self-pay | Admitting: Hematology and Oncology

## 2020-04-19 ENCOUNTER — Other Ambulatory Visit: Payer: Self-pay

## 2020-04-19 ENCOUNTER — Inpatient Hospital Stay: Payer: Medicare Other | Attending: Hematology and Oncology

## 2020-04-19 DIAGNOSIS — Z87891 Personal history of nicotine dependence: Secondary | ICD-10-CM | POA: Insufficient documentation

## 2020-04-19 DIAGNOSIS — D72819 Decreased white blood cell count, unspecified: Secondary | ICD-10-CM | POA: Insufficient documentation

## 2020-04-19 DIAGNOSIS — D649 Anemia, unspecified: Secondary | ICD-10-CM | POA: Insufficient documentation

## 2020-04-19 DIAGNOSIS — D696 Thrombocytopenia, unspecified: Secondary | ICD-10-CM | POA: Diagnosis not present

## 2020-04-19 DIAGNOSIS — D472 Monoclonal gammopathy: Secondary | ICD-10-CM

## 2020-04-19 DIAGNOSIS — D573 Sickle-cell trait: Secondary | ICD-10-CM | POA: Diagnosis not present

## 2020-04-19 LAB — CBC WITH DIFFERENTIAL (CANCER CENTER ONLY)
Abs Immature Granulocytes: 0 10*3/uL (ref 0.00–0.07)
Basophils Absolute: 0 10*3/uL (ref 0.0–0.1)
Basophils Relative: 0 %
Eosinophils Absolute: 0 10*3/uL (ref 0.0–0.5)
Eosinophils Relative: 1 %
HCT: 35.9 % — ABNORMAL LOW (ref 39.0–52.0)
Hemoglobin: 12.1 g/dL — ABNORMAL LOW (ref 13.0–17.0)
Immature Granulocytes: 0 %
Lymphocytes Relative: 40 %
Lymphs Abs: 1.2 10*3/uL (ref 0.7–4.0)
MCH: 30.6 pg (ref 26.0–34.0)
MCHC: 33.7 g/dL (ref 30.0–36.0)
MCV: 90.7 fL (ref 80.0–100.0)
Monocytes Absolute: 0.2 10*3/uL (ref 0.1–1.0)
Monocytes Relative: 7 %
Neutro Abs: 1.5 10*3/uL — ABNORMAL LOW (ref 1.7–7.7)
Neutrophils Relative %: 52 %
Platelet Count: 142 10*3/uL — ABNORMAL LOW (ref 150–400)
RBC: 3.96 MIL/uL — ABNORMAL LOW (ref 4.22–5.81)
RDW: 13.1 % (ref 11.5–15.5)
WBC Count: 2.9 10*3/uL — ABNORMAL LOW (ref 4.0–10.5)
nRBC: 0 % (ref 0.0–0.2)

## 2020-04-19 LAB — CMP (CANCER CENTER ONLY)
ALT: 12 U/L (ref 0–44)
AST: 21 U/L (ref 15–41)
Albumin: 4.1 g/dL (ref 3.5–5.0)
Alkaline Phosphatase: 65 U/L (ref 38–126)
Anion gap: 6 (ref 5–15)
BUN: 14 mg/dL (ref 8–23)
CO2: 26 mmol/L (ref 22–32)
Calcium: 8.7 mg/dL — ABNORMAL LOW (ref 8.9–10.3)
Chloride: 109 mmol/L (ref 98–111)
Creatinine: 1.46 mg/dL — ABNORMAL HIGH (ref 0.61–1.24)
GFR, Estimated: 51 mL/min — ABNORMAL LOW (ref >60)
Glucose, Bld: 100 mg/dL — ABNORMAL HIGH (ref 70–99)
Potassium: 3.9 mmol/L (ref 3.5–5.1)
Sodium: 141 mmol/L (ref 135–145)
Total Bilirubin: 0.5 mg/dL (ref 0.3–1.2)
Total Protein: 7.1 g/dL (ref 6.5–8.1)

## 2020-04-19 LAB — LACTATE DEHYDROGENASE: LDH: 141 U/L (ref 98–192)

## 2020-04-20 ENCOUNTER — Encounter: Payer: Self-pay | Admitting: Hematology and Oncology

## 2020-04-20 LAB — KAPPA/LAMBDA LIGHT CHAINS
Kappa free light chain: 45.1 mg/L — ABNORMAL HIGH (ref 3.3–19.4)
Kappa, lambda light chain ratio: 2.37 — ABNORMAL HIGH (ref 0.26–1.65)
Lambda free light chains: 19 mg/L (ref 5.7–26.3)

## 2020-04-21 LAB — MULTIPLE MYELOMA PANEL, SERUM
Albumin SerPl Elph-Mcnc: 4 g/dL (ref 2.9–4.4)
Albumin/Glob SerPl: 1.5 (ref 0.7–1.7)
Alpha 1: 0.1 g/dL (ref 0.0–0.4)
Alpha2 Glob SerPl Elph-Mcnc: 0.5 g/dL (ref 0.4–1.0)
B-Globulin SerPl Elph-Mcnc: 0.9 g/dL (ref 0.7–1.3)
Gamma Glob SerPl Elph-Mcnc: 1.2 g/dL (ref 0.4–1.8)
Globulin, Total: 2.7 g/dL (ref 2.2–3.9)
IgA: 171 mg/dL (ref 61–437)
IgG (Immunoglobin G), Serum: 1214 mg/dL (ref 603–1613)
IgM (Immunoglobulin M), Srm: 104 mg/dL (ref 15–143)
M Protein SerPl Elph-Mcnc: 0.2 g/dL — ABNORMAL HIGH
Total Protein ELP: 6.7 g/dL (ref 6.0–8.5)

## 2020-04-23 ENCOUNTER — Telehealth: Payer: Self-pay

## 2020-04-23 NOTE — Progress Notes (Signed)
Chronic Care Management Pharmacy Assistant   Name: Calvin [name].  MRN: [id number] DOB: [birth year]  Reason for [place]  Call.  Patient Questions:  1.  Have you seen any other providers since your last visit? Yes, 03/23/2020 Pulmonology Tammy Parrett  2.  Any changes in your medicines or health? No   PCP : Flossie Buffy, NP  Allergies:  No Known Allergies  Medications: Outpatient Encounter Medications as of 04/23/2020  Medication Sig  . allopurinol (ZYLOPRIM) 300 MG tablet Take 1 tablet (300 mg total) by mouth daily.  Marland Kitchen aspirin 81 MG tablet Take 81 mg by mouth daily.  . Blood Pressure Monitoring (B-D ASSURE BPM/AUTO ARM CUFF) MISC 1 Units by Does not apply route daily. Arm circumference: 40cm  . fenofibrate 54 MG tablet Take 1 tablet (54 mg total) by mouth daily.  . finasteride (PROSCAR) 5 MG tablet TAKE 1 TABLET BY MOUTH  DAILY  . irbesartan (AVAPRO) 300 MG tablet Take 1 tablet (300 mg total) by mouth daily.  . pantoprazole (PROTONIX) 40 MG tablet TAKE 1 TABLET BY MOUTH  DAILY  . pravastatin (PRAVACHOL) 20 MG tablet Take 1 tablet (20 mg total) by mouth daily.  . sildenafil (VIAGRA) 100 MG tablet Take 1 tablet (100 mg total) by mouth as needed for erectile dysfunction. Yearly physical w/labs due in February must see MD for refills  . tamsulosin (FLOMAX) 0.4 MG CAPS capsule Take 1 capsule (0.4 mg total) by mouth daily.  . verapamil (CALAN-SR) 240 MG CR tablet TAKE 1 TABLET BY MOUTH AT  BEDTIME   No facility-administered encounter medications on file as of 04/23/2020.    Current Diagnosis: Patient Active Problem List   Diagnosis Date Noted  . Obesity, morbid ([place]) 02/02/2020  . Monoclonal gammopathies 01/23/2019  . Iron deficiency anemia 09/20/2018  . Fever 07/03/2018  . Thrombocytopenia ([place]) 05/09/2018  . Degenerative arthritis of knee, bilateral 12/17/2017  . Degenerative TFCC tear, right 11/08/2017  . Carpal tunnel syndrome on  right 08/31/2017  . Degenerative joint disease of knee, left 08/03/2017  . Right wrist pain 07/17/2017  . Rash 05/08/2017  . Sinus bradycardia 05/04/2016  . Hyperglycemia 05/04/2016  . Low serum HDL 05/04/2016  . Hypertriglyceridemia 05/04/2016  . Left rotator cuff tear 11/23/2015  . Memory dysfunction 04/23/2015  . Bursitis of right shoulder 03/03/2015  . Pain of right side of body 01/16/2015  . Acute meniscal tear of left knee 12/10/2013  . Localized osteoarthrosis, lower leg 12/10/2013  . OSA (obstructive sleep apnea) 11/13/2013  . Peripheral edema 08/27/2013  . Diarrhea 08/27/2013  . Increased prostate specific antigen (PSA) velocity 04/02/2013  . Bilateral knee pain 11/13/2012  . Left knee pain 11/13/2012  . Chronic gout of knee due to drug 11/13/2012  . Urinary frequency 07/20/2012  . Urinary retention 07/20/2012  . Hip pain, acute 07/07/2012  . Preventative health care 02/28/2011  . SKIN LESION 05/26/2009  . LIBIDO, DECREASED 05/26/2009  . BACK PAIN 04/01/2009  . GOUT 10/30/2007  . Nocturia 09/04/2007  . FATIGUE 08/20/2007  . ALLERGIC RHINITIS 05/01/2007  . Venous insufficiency of both lower extremities 03/09/2007  . DIVERTICULOSIS, COLON 03/09/2007  . COLONIC POLYPS, HX OF 03/09/2007  . Essential hypertension 10/31/2006    Goals Addressed   None    04/23/2020 Name: Calvin [name]. MRN: [id number] DOB: 05/03/1950 Calvin [name]. is a 70 y.o. year old male who is a primary care patient of Nche, Charlene Brooke,  NP.  Comprehensive medication review performed; Spoke to patient regarding cholesterol  Lipid Panel    Component Value Date/Time   CHOL 174 04/23/2019 1001   TRIG 143.0 04/23/2019 1001   TRIG 41 02/12/2006 0738   HDL 22.90 (L) 04/23/2019 1001   LDLCALC 123 (H) 04/23/2019 1001   LDLCALC 64 01/22/2019 1421   LDLDIRECT 61.0 11/26/2018 1036    10-year ASCVD risk score: The 10-year ASCVD risk score Mikey Bussing DC Brooke Bonito., et al., 2013) is: 30.2%   Values  used to calculate the score:     Age: 70 years     Sex: Male     Is Non-Hispanic African American: Yes     Diabetic: No     Tobacco smoker: No     Systolic Blood Pressure: 619 mmHg     Is BP treated: Yes     HDL Cholesterol: 22.9 mg/dL     Total Cholesterol: 174 mg/dL  . Current antihyperlipidemic regimen:   Fenofibrate 54 mg daily  Pravastatin 20 mg daily  . Previous antihyperlipidemic medications tried: None ID . ASCVD risk enhancing conditions: age >78 and HTN . What recent interventions/DTPs have been made by any provider to improve Cholesterol control since last CPP Visit: None ID . Any recent hospitalizations or ED visits since last visit with CPP? No . What diet changes have been made to improve Cholesterol?     Patient reports he cooks at home without using salt.             Patient states he may eat out once in awhile. . What exercise is being done to improve Cholesterol?  ? None ID   Adherence Review: Does the patient have >5 day gap between last estimated fill dates? Yes   Maryjean Ka   Follow-Up:  Pharmacist Review   Anderson [place] Clinical Pharmacist Assistant [phone] (40 Minutes spent)

## 2020-04-26 ENCOUNTER — Other Ambulatory Visit: Payer: Medicare Other

## 2020-04-26 ENCOUNTER — Inpatient Hospital Stay: Payer: Medicare Other | Admitting: Hematology and Oncology

## 2020-04-26 ENCOUNTER — Ambulatory Visit: Payer: Medicare Other | Admitting: Hematology and Oncology

## 2020-04-26 ENCOUNTER — Other Ambulatory Visit: Payer: Self-pay

## 2020-04-26 VITALS — BP 135/76 | HR 66 | Temp 95.7°F | Resp 18 | Ht 68.0 in | Wt 259.7 lb

## 2020-04-26 DIAGNOSIS — D472 Monoclonal gammopathy: Secondary | ICD-10-CM | POA: Diagnosis not present

## 2020-04-26 DIAGNOSIS — D696 Thrombocytopenia, unspecified: Secondary | ICD-10-CM | POA: Diagnosis not present

## 2020-04-26 DIAGNOSIS — D72819 Decreased white blood cell count, unspecified: Secondary | ICD-10-CM | POA: Diagnosis not present

## 2020-04-26 DIAGNOSIS — Z87891 Personal history of nicotine dependence: Secondary | ICD-10-CM | POA: Diagnosis not present

## 2020-04-26 DIAGNOSIS — D649 Anemia, unspecified: Secondary | ICD-10-CM | POA: Diagnosis not present

## 2020-04-27 ENCOUNTER — Encounter: Payer: Self-pay | Admitting: Hematology and Oncology

## 2020-04-27 NOTE — Progress Notes (Signed)
[place] [phone]   [phone]  PROGRESS NOTE  Patient Care Team: Nche, Charlene Brooke, NP as PCP - General (Internal Medicine) Franchot Gallo, MD as Consulting Physician (Urology) Netta Cedars, MD as Consulting Physician (Orthopedic Surgery) Germaine Pomfret, [facility] as Pharmacist (Pharmacist)  Hematological/Oncological History  # Normocytic Anemia 1) 05/29/2018: HB electrophoresis confirms sickle cell trait. Ferritin 18. Hgb 13.1. MMA 107 2) 01/22/2019: iron 112, TIBC 313, Sat 36%, ferritin 31. Hgb 12.6 3) 01/27/2019: establish care with Dr. Lorenso Courier. WBC 2.6, Hgb 12.6, Plt 121, MCV 91.4 4) 10/20/2019: WBC 3.0, Hgb 12.9, Plt 115, MCV 91   #IgG Kappa MGUS 1) 09/18/18:  M protein 0.2, kappa 38, lambda 17.6, ratio 2.2 2) 01/27/2019: establish care with Dr. Lorenso Courier  3) 10/20/2019: M protein 0.1, Kappa 48.3, Lambda 18, Ratio 2.68  #Leukopenia 1) Chronically low, with WBC 3.5 dating back to 03/24/2009 2) 2015-2020: stable at baseline WBC 3.5-5.0 3) 01/27/2019: establish care with Dr. Lorenso Courier.  WBC 2.6, Hgb 12.6, Plt 121, MCV 91.4 4) 10/20/2019: WBC 3.0, Hgb 12.9, Plt 115, MCV 91  Interval History:  Calvin [name]. 70 y.o. male with medical history significant for MGUS presents for a follow up visit. He was last seen on 10/24/2019. In the interim since our last visit he has had no ED visits or hospitalizations.   On exam today Calvin [name] is accompanied by his wife.  They are both wearing red and very [place]  He reports that he has been at a stable level of health with no changes.  He denies any fevers, chills, sweats, nausea vomiting or diarrhea.  He reports that he has had no bone pain, back pain, or other changes to his health since our last visit.  A full 10 point ROS is listed below.  MEDICAL HISTORY:  Past Medical History:  Diagnosis Date  . Allergy   . Arthritis   .  Diverticulosis   . Gout   . Hyperlipidemia   . Hypertension   . Iron deficiency anemia 09/20/2018  . OSA (obstructive sleep apnea) 11/13/2013  . Sickle cell trait ([place])   . Sleep apnea     SURGICAL HISTORY: Past Surgical History:  Procedure Laterality Date  . COLONOSCOPY    . POLYPECTOMY    . ROTATOR CUFF REPAIR     left  . THROAT SURGERY     polyps removed from vocal cord    SOCIAL HISTORY: Social History   Socioeconomic History  . Marital status: Married    Spouse name: Not on file  . Number of children: 1  . Years of education: Not on file  . Highest education level: Not on file  Occupational History  . Occupation: retired  Tobacco Use  . Smoking status: Former Smoker    Packs/day: 2.00    Years: 5.00    Pack years: 10.00    Types: Cigarettes    Quit date: 05/23/1977    Years since quitting: 42.9  . Smokeless tobacco: Never Used  Vaping Use  . Vaping Use: Never used  Substance and Sexual Activity  . Alcohol use: No    Comment: QUIT 2002  . Drug use: No    Comment: Completed program in april 2000  . Sexual activity: Yes  Other Topics Concern  . Not on file  Social History Narrative  . Not on file   Social Determinants of Health   Financial  Resource Strain: Low Risk   . Difficulty of Paying Living Expenses: Not hard at all  Food Insecurity: No Food Insecurity  . Worried About Charity fundraiser in the Last Year: Never true  . Ran Out of Food in the Last Year: Never true  Transportation Needs: No Transportation Needs  . Lack of Transportation (Medical): No  . Lack of Transportation (Non-Medical): No  Physical Activity: Sufficiently Active  . Days of Exercise per Week: 3 days  . Minutes of Exercise per Session: 60 min  Stress: No Stress Concern Present  . Feeling of Stress : Not at all  Social Connections: Socially Integrated  . Frequency of Communication with Friends and Family: More than three times a week  . Frequency of Social Gatherings with  Friends and Family: Twice a week  . Attends Religious Services: More than 4 times per year  . Active Member of Clubs or Organizations: Yes  . Attends Archivist Meetings: More than 4 times per year  . Marital Status: Married  Human resources officer Violence: Not on file    FAMILY HISTORY: Family History  Problem Relation Age of Onset  . Colon cancer Neg Hx   . Esophageal cancer Neg Hx   . Rectal cancer Neg Hx   . Stomach cancer Neg Hx     ALLERGIES:  has No Known Allergies.  MEDICATIONS:  Current Outpatient Medications  Medication Sig Dispense Refill  . fexofenadine (ALLEGRA) 180 MG tablet Take 180 mg by mouth daily.    Marland Kitchen allopurinol (ZYLOPRIM) 300 MG tablet Take 1 tablet (300 mg total) by mouth daily. 90 tablet 3  . aspirin 81 MG tablet Take 81 mg by mouth daily.    . Blood Pressure Monitoring (B-D ASSURE BPM/AUTO ARM CUFF) MISC 1 Units by Does not apply route daily. Arm circumference: 40cm 1 each 0  . fenofibrate 54 MG tablet Take 1 tablet (54 mg total) by mouth daily. 90 tablet 3  . finasteride (PROSCAR) 5 MG tablet TAKE 1 TABLET BY MOUTH  DAILY 90 tablet 3  . FLUZONE HIGH-DOSE QUADRIVALENT 0.7 ML SUSY     . irbesartan (AVAPRO) 300 MG tablet Take 1 tablet (300 mg total) by mouth daily. 90 tablet 3  . pantoprazole (PROTONIX) 40 MG tablet TAKE 1 TABLET BY MOUTH  DAILY 90 tablet 1  . pravastatin (PRAVACHOL) 20 MG tablet Take 1 tablet (20 mg total) by mouth daily. 90 tablet 3  . sildenafil (VIAGRA) 100 MG tablet Take 1 tablet (100 mg total) by mouth as needed for erectile dysfunction. Yearly physical w/labs due in February must see MD for refills 10 tablet 0  . tamsulosin (FLOMAX) 0.4 MG CAPS capsule Take 1 capsule (0.4 mg total) by mouth daily. 90 capsule 3  . verapamil (CALAN-SR) 240 MG CR tablet TAKE 1 TABLET BY MOUTH AT  BEDTIME 90 tablet 3   No current facility-administered medications for this visit.    REVIEW OF SYSTEMS:   Constitutional: ( - ) fevers, ( - )  chills  , ( - ) night sweats Eyes: ( - ) blurriness of vision, ( - ) double vision, ( - ) watery eyes Ears, nose, mouth, throat, and face: ( - ) mucositis, ( - ) sore throat Respiratory: ( - ) cough, ( - ) dyspnea, ( - ) wheezes Cardiovascular: ( - ) palpitation, ( - ) chest discomfort, ( - ) lower extremity swelling Gastrointestinal:  ( - ) nausea, ( - ) heartburn, ( - )  change in bowel habits Skin: ( - ) abnormal skin rashes Lymphatics: ( - ) new lymphadenopathy, ( - ) easy bruising Neurological: ( - ) numbness, ( - ) tingling, ( - ) new weaknesses Behavioral/Psych: ( - ) mood change, ( - ) new changes  All other systems were reviewed with the patient and are negative.  PHYSICAL EXAMINATION: ECOG PERFORMANCE STATUS: 1 - Symptomatic but completely ambulatory  Vitals:   04/26/20 1028  BP: 135/76  Pulse: 66  Resp: 18  Temp: (!) 95.7 F (35.4 C)  SpO2: 100%   Filed Weights   04/26/20 1028  Weight: 259 lb 11.2 oz (117.8 kg)    GENERAL: well appearing African American male in NAD  SKIN: skin color, texture, turgor are normal, no rashes or significant lesions EYES: conjunctiva are pink and non-injected, sclera clear LUNGS: clear to auscultation and percussion with normal breathing effort HEART: regular rate & rhythm and no murmurs and no lower extremity edema Musculoskeletal: no cyanosis of digits and no clubbing  PSYCH: alert & oriented x 3, fluent speech NEURO: no focal motor/sensory deficits  LABORATORY DATA:  I have reviewed the data as listed CBC Latest Ref Rng & Units 04/19/2020 10/20/2019 04/30/2019  WBC 4.0 - 10.5 K/uL 2.9(L) 3.0(L) 2.7(L)  Hemoglobin 13.0 - 17.0 g/dL 12.1(L) 12.9(L) 13.0  Hematocrit 39.0 - 52.0 % 35.9(L) 37.6(L) 37.4(L)  Platelets 150 - 400 K/uL 142(L) 115(L) 149(L)   CMP Latest Ref Rng & Units 04/19/2020 02/02/2020 10/20/2019  Glucose 70 - 99 mg/dL 100(H) 85 117(H)  BUN 8 - 23 mg/dL $Remove'14 19 16  'OGdPYFq$ Creatinine 0.61 - 1.24 mg/dL 1.46(H) 1.43 1.53(H)  Sodium 135 - 145  mmol/L 141 142 142  Potassium 3.5 - 5.1 mmol/L 3.9 4.0 3.9  Chloride 98 - 111 mmol/L 109 108 109  CO2 22 - 32 mmol/L $RemoveB'26 27 24  'SxUNCTeH$ Calcium 8.9 - 10.3 mg/dL 8.7(L) 9.1 9.4  Total Protein 6.5 - 8.1 g/dL 7.1 - 7.4  Total Bilirubin 0.3 - 1.2 mg/dL 0.5 - 0.6  Alkaline Phos 38 - 126 U/L 65 - 68  AST 15 - 41 U/L 21 - 21  ALT 0 - 44 U/L 12 - 15   Lab Results  Component Value Date   KPAFRELGTCHN 45.1 (H) 04/19/2020   KPAFRELGTCHN 48.3 (H) 10/20/2019   KPAFRELGTCHN 45.2 (H) 04/30/2019   LAMBDASER 19.0 04/19/2020   LAMBDASER 18.0 10/20/2019   LAMBDASER 21.4 04/30/2019   KAPLAMBRATIO 2.37 (H) 04/19/2020   KAPLAMBRATIO 2.68 (H) 10/20/2019   KAPLAMBRATIO 2.11 (H) 04/30/2019    Lab Results  Component Value Date   MPROTEIN 0.2 (H) 04/19/2020   MPROTEIN 0.1 (H) 10/20/2019   MPROTEIN 0.2 (H) 04/30/2019     RADIOGRAPHIC STUDIES: None to review  ASSESSMENT & PLAN Calvin [name]. 70 y.o. male with medical history significant for MGUS presents for a follow up visit.  Review of Calvin [name] labs appear stable.    Given the stability of his reported health and the stability of his labs I think we can safely follow-up the patient approximately 6 months time.  At this juncture there is no clear indication for a bone marrow biopsy though I have would have a low threshold to perform this if any worsening abnormalities were noted in his blood.  We will plan to see the patient back in approximately 6 months however if he were to have any new symptoms in the interim we recommend that he call our clinic and we can  always see him sooner.  #Normocytic Anemia, stable #Thrombocytopenia, stable --HB electrophoresis confirms sickle cell trait, though he has had normal baseline Hgb before in the past.  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 He may have developed low iron stores due to this generosity. He last donated in 2018. His counts appear stable at this  time. Iron stores appear replete as of 04/30/2019.  --etiology of thrombocytopenia is unclear. Prior abdominal imaging shows no evidence of cirrhosis, Hepatitis serologies were negative. Continue to monitor  --RTC in 6 months for further review.   #IgG Kappa Monoclonal Gammopathy of Undetermined Significance, stable  --last SPEP/SFLC showed stable labs --will repeat SPEP and SFLC 1 week prior to his next visit.   --patient will require a bone survey at least yearly. Due at next visits.  --continue to monitor at least q 67months.   #Leukopenia, stable --low WBC dating back to 2011, stably low around 3.5-.5.0.  --05/29/2018: negative for HIV, Hep C, but had Hep B core antibody, no surface antigen or surface antibody.  --continue to monitor   No orders of the defined types were placed in this encounter.   All questions were answered. The patient knows to call the clinic with any problems, questions or concerns.  A total of more than 30 minutes were spent face-to-face with the patient during this encounter and over half of that time was spent on counseling and coordination of care as outlined above.   Ledell Peoples, MD Department of Hematology/Oncology [place] [facility] Phone: [phone] Pager: [phone] Email: [email or web address]  04/27/2020 1:23 PM

## 2020-04-30 ENCOUNTER — Telehealth: Payer: Self-pay

## 2020-04-30 NOTE — Progress Notes (Signed)
Patient states he would like to add Aller-itin/ loratadine 10 mg to his med list.Patient states he prefers to have his medication list up to date so all of his providers are informed.   Patient reports he is going on a trip to [place] soon and is going to start taking his Aller-itin/ loratadine now to prepare for his trip.  Patient states he takes Aller-itin/ loratadine 10 mg tablet Daily.Notifed Clinical pharmacist  of patient request.  Anderson [place] Clinical Pharmacist Assistant [phone]

## 2020-05-11 NOTE — Addendum Note (Signed)
Addended by: Daron Offer A on: 05/11/2020 11:24 AM   Modules accepted: Orders

## 2020-05-30 ENCOUNTER — Other Ambulatory Visit: Payer: Self-pay | Admitting: Internal Medicine

## 2020-05-30 ENCOUNTER — Other Ambulatory Visit: Payer: Self-pay | Admitting: Nurse Practitioner

## 2020-05-30 DIAGNOSIS — I10 Essential (primary) hypertension: Secondary | ICD-10-CM

## 2020-06-01 ENCOUNTER — Other Ambulatory Visit: Payer: Self-pay | Admitting: Nurse Practitioner

## 2020-06-01 DIAGNOSIS — E782 Mixed hyperlipidemia: Secondary | ICD-10-CM

## 2020-06-01 DIAGNOSIS — M1A0620 Idiopathic chronic gout, left knee, without tophus (tophi): Secondary | ICD-10-CM

## 2020-06-01 DIAGNOSIS — R350 Frequency of micturition: Secondary | ICD-10-CM

## 2020-06-01 DIAGNOSIS — N401 Enlarged prostate with lower urinary tract symptoms: Secondary | ICD-10-CM

## 2020-06-11 ENCOUNTER — Telehealth: Payer: Self-pay

## 2020-06-11 NOTE — Progress Notes (Signed)
Left Voice message to confirmed patient telephone appointment on 06/14/2020 for CCM at 11:00 am with Junius Argyle the Clinical pharmacist.    [place] Pharmacist Assistant [phone]

## 2020-06-14 ENCOUNTER — Ambulatory Visit (INDEPENDENT_AMBULATORY_CARE_PROVIDER_SITE_OTHER): Payer: Medicare Other

## 2020-06-14 ENCOUNTER — Other Ambulatory Visit: Payer: Self-pay

## 2020-06-14 VITALS — Ht 68.0 in | Wt 261.2 lb

## 2020-06-14 DIAGNOSIS — M1A0620 Idiopathic chronic gout, left knee, without tophus (tophi): Secondary | ICD-10-CM

## 2020-06-14 DIAGNOSIS — E782 Mixed hyperlipidemia: Secondary | ICD-10-CM

## 2020-06-14 DIAGNOSIS — R350 Frequency of micturition: Secondary | ICD-10-CM

## 2020-06-14 DIAGNOSIS — N401 Enlarged prostate with lower urinary tract symptoms: Secondary | ICD-10-CM

## 2020-06-14 DIAGNOSIS — K219 Gastro-esophageal reflux disease without esophagitis: Secondary | ICD-10-CM

## 2020-06-14 DIAGNOSIS — I10 Essential (primary) hypertension: Secondary | ICD-10-CM | POA: Diagnosis not present

## 2020-06-14 NOTE — Progress Notes (Signed)
Chronic Care Management Pharmacy Note  06/14/2020 Name:  Calvin [name]. MRN:  [id number] DOB:  12-Mar-1951  Subjective: Calvin Broad. is an 70 y.o. year old male who is a primary patient of Nche, Charlene Brooke, NP.  The CCM team was consulted for assistance with disease management and care coordination needs.    Engaged with patient by telephone for follow up visit in response to provider referral for pharmacy case management and/or care coordination services.   Consent to Services:  The patient was given information about Chronic Care Management services, agreed to services, and gave verbal consent prior to initiation of services.  Please see initial visit note for detailed documentation.   Patient Care Team: Nche, Charlene Brooke, NP as PCP - General (Internal Medicine) Franchot Gallo, MD as Consulting Physician (Urology) Netta Cedars, MD as Consulting Physician (Orthopedic Surgery) Germaine Pomfret, [facility] as Pharmacist (Pharmacist)  Recent office visits: 02/02/20: Patient presented to Wilfred Lacy, NP for follow-up. Colchicine, loratadine, nabumetone stopped.   Recent consult visits: 03/23/20: Patient presented to Rexene Edison, NP (Pulmonology) for sleep apnea follow-up. New CPAP machine ordered.  04/27/20: Patient presented to Dr. Lorenso Courier (Oncology) for follow-up.   Hospital visits: None in previous 6 months  Objective:  Lab Results  Component Value Date   CREATININE 1.46 (H) 04/19/2020   BUN 14 04/19/2020   GFR 49.31 (L) 02/02/2020   GFRNONAA 51 (L) 04/19/2020   GFRAA 52 (L) 10/20/2019   NA 141 04/19/2020   K 3.9 04/19/2020   CALCIUM 8.7 (L) 04/19/2020   CO2 26 04/19/2020   GLUCOSE 100 (H) 04/19/2020    Lab Results  Component Value Date/Time   HGBA1C 5.1 04/23/2019 10:01 AM   HGBA1C 5.5 11/26/2018 10:36 AM   GFR 49.31 (L) 02/02/2020 10:55 AM   GFR 57.19 (L) 04/23/2019 10:01 AM   MICROALBUR 5.9 01/22/2019 02:21 PM    Last diabetic Eye exam: No  results found for: HMDIABEYEEXA  Last diabetic Foot exam: No results found for: HMDIABFOOTEX   Lab Results  Component Value Date   CHOL 174 04/23/2019   HDL 22.90 (L) 04/23/2019   LDLCALC 123 (H) 04/23/2019   LDLDIRECT 61.0 11/26/2018   TRIG 143.0 04/23/2019   CHOLHDL 8 04/23/2019    Hepatic Function Latest Ref Rng & Units 04/19/2020 10/20/2019 04/30/2019  Total Protein 6.5 - 8.1 g/dL 7.1 7.4 7.5  Albumin 3.5 - 5.0 g/dL 4.1 4.1 4.4  AST 15 - 41 U/L $Remo'21 21 23  'Fbbbo$ ALT 0 - 44 U/L $Remo'12 15 23  'izhgM$ Alk Phosphatase 38 - 126 U/L 65 68 53  Total Bilirubin 0.3 - 1.2 mg/dL 0.5 0.6 0.7  Bilirubin, Direct 0.0 - 0.3 mg/dL - - -    Lab Results  Component Value Date/Time   TSH 1.338 01/27/2019 12:43 PM   TSH 0.75 01/22/2019 02:21 PM   TSH 1.14 05/09/2018 10:09 AM    CBC Latest Ref Rng & Units 04/19/2020 10/20/2019 04/30/2019  WBC 4.0 - 10.5 K/uL 2.9(L) 3.0(L) 2.7(L)  Hemoglobin 13.0 - 17.0 g/dL 12.1(L) 12.9(L) 13.0  Hematocrit 39.0 - 52.0 % 35.9(L) 37.6(L) 37.4(L)  Platelets 150 - 400 K/uL 142(L) 115(L) 149(L)    No results found for: VD25OH  Clinical ASCVD: Yes  The 10-year ASCVD risk score Mikey Bussing DC Jr., et al., 2013) is: 25.5%   Values used to calculate the score:     Age: 29 years     Sex: Male     Is Non-Hispanic African  American: Yes     Diabetic: No     Tobacco smoker: No     Systolic Blood Pressure: 135 mmHg     Is BP treated: Yes     HDL Cholesterol: 22.9 mg/dL     Total Cholesterol: 174 mg/dL    Depression screen [facility] 2/9 09/24/2019 01/22/2019 09/10/2018  Decreased Interest 0 0 0  Down, Depressed, Hopeless 0 0 0  PHQ - 2 Score 0 0 0  Some recent data might be hidden    Social History   Tobacco Use  Smoking Status Former Smoker  . Packs/day: 2.00  . Years: 5.00  . Pack years: 10.00  . Types: Cigarettes  . Quit date: 05/23/1977  . Years since quitting: 43.0  Smokeless Tobacco Never Used   BP Readings from Last 3 Encounters:  04/26/20 135/76  03/23/20 (!) 150/90  02/02/20  136/80   Pulse Readings from Last 3 Encounters:  04/26/20 66  03/23/20 74  02/02/20 70   Wt Readings from Last 3 Encounters:  06/14/20 261 lb 3.2 oz (118.5 kg)  04/26/20 259 lb 11.2 oz (117.8 kg)  03/23/20 263 lb 9.6 oz (119.6 kg)   BMI Readings from Last 3 Encounters:  06/14/20 39.72 kg/m  04/26/20 39.49 kg/m  03/23/20 40.08 kg/m    Assessment/Interventions: Review of patient past medical history, allergies, medications, health status, including review of consultants reports, laboratory and other test data, was performed as part of comprehensive evaluation and provision of chronic care management services.   SDOH:  (Social Determinants of Health) assessments and interventions performed: Yes SDOH Interventions   Flowsheet Row Most Recent Value  SDOH Interventions   Financial Strain Interventions Intervention Not Indicated     SDOH Screenings   Alcohol Screen: Low Risk   . Last Alcohol Screening Score (AUDIT): 0  Depression (PHQ2-9): Low Risk   . PHQ-2 Score: 0  Financial Resource Strain: Low Risk   . Difficulty of Paying Living Expenses: Not hard at all  Food Insecurity: No Food Insecurity  . Worried About Programme researcher, broadcasting/film/video in the Last Year: Never true  . Ran Out of Food in the Last Year: Never true  Housing: Low Risk   . Last Housing Risk Score: 0  Physical Activity: Sufficiently Active  . Days of Exercise per Week: 3 days  . Minutes of Exercise per Session: 60 min  Social Connections: Socially Integrated  . Frequency of Communication with Friends and Family: More than three times a week  . Frequency of Social Gatherings with Friends and Family: Twice a week  . Attends Religious Services: More than 4 times per year  . Active Member of Clubs or Organizations: Yes  . Attends Banker Meetings: More than 4 times per year  . Marital Status: Married  Stress: No Stress Concern Present  . Feeling of Stress : Not at all  Tobacco Use: Medium Risk  .  Smoking Tobacco Use: Former Smoker  . Smokeless Tobacco Use: Never Used  Transportation Needs: No Transportation Needs  . Lack of Transportation (Medical): No  . Lack of Transportation (Non-Medical): No    CCM Care Plan  No Known Allergies  Medications Reviewed Today    Reviewed by Jaci Standard, MD (Physician) on 04/27/20 at 1327  Med List Status: <None>  Medication Order Taking? Sig Documenting Provider Last Dose Status Informant  allopurinol (ZYLOPRIM) 300 MG tablet [id number]  Take 1 tablet (300 mg total) by mouth daily. Nche, Bonna Gains,  NP  Active   aspirin 81 MG tablet [id number]  Take 81 mg by mouth daily. [provider]  Active   Blood Pressure Monitoring (B-D ASSURE BPM/AUTO ARM CUFF) MISC [id number]  1 Units by Does not apply route daily. Arm circumference: 40cm Nche, Charlene Brooke, NP  Active   fenofibrate 54 MG tablet [id number]  Take 1 tablet (54 mg total) by mouth daily. Flossie Buffy, NP  Active   fexofenadine (ALLEGRA) 180 MG tablet [id number] Yes Take 180 mg by mouth daily. [provider]  Active   finasteride (PROSCAR) 5 MG tablet [id number]  TAKE 1 TABLET BY MOUTH  DAILY Nche, Charlene Brooke, NP  Active   FLUZONE HIGH-DOSE QUADRIVALENT 0.7 ML SUSY [id number]   [provider]  Active   irbesartan (AVAPRO) 300 MG tablet [id number]  Take 1 tablet (300 mg total) by mouth daily. Flossie Buffy, NP  Active   pantoprazole (PROTONIX) 40 MG tablet [id number]  TAKE 1 TABLET BY MOUTH  DAILY Irene Shipper, MD  Active   pravastatin (PRAVACHOL) 20 MG tablet [id number]  Take 1 tablet (20 mg total) by mouth daily. Nche, Charlene Brooke, NP  Active   sildenafil (VIAGRA) 100 MG tablet [id number]  Take 1 tablet (100 mg total) by mouth as needed for erectile dysfunction. Yearly physical w/labs due in February must see MD for refills Biagio Borg, MD  Active   tamsulosin [facility]) 0.4 MG CAPS capsule [id number]  Take 1 capsule (0.4 mg total) by mouth daily.  Flossie Buffy, NP  Active   verapamil (CALAN-SR) 240 MG CR tablet [id number]  TAKE 1 TABLET BY MOUTH AT  BEDTIME Nche, Charlene Brooke, NP  Active           Patient Active Problem List   Diagnosis Date Noted  . Obesity, morbid ([place]) 02/02/2020  . Monoclonal gammopathies 01/23/2019  . Iron deficiency anemia 09/20/2018  . Fever 07/03/2018  . Thrombocytopenia ([place]) 05/09/2018  . Degenerative arthritis of knee, bilateral 12/17/2017  . Degenerative TFCC tear, right 11/08/2017  . Carpal tunnel syndrome on right 08/31/2017  . Degenerative joint disease of knee, left 08/03/2017  . Right wrist pain 07/17/2017  . Rash 05/08/2017  . Sinus bradycardia 05/04/2016  . Hyperglycemia 05/04/2016  . Low serum HDL 05/04/2016  . Hypertriglyceridemia 05/04/2016  . Left rotator cuff tear 11/23/2015  . Memory dysfunction 04/23/2015  . Bursitis of right shoulder 03/03/2015  . Pain of right side of body 01/16/2015  . Acute meniscal tear of left knee 12/10/2013  . Localized osteoarthrosis, lower leg 12/10/2013  . OSA (obstructive sleep apnea) 11/13/2013  . Peripheral edema 08/27/2013  . Diarrhea 08/27/2013  . Increased prostate specific antigen (PSA) velocity 04/02/2013  . Bilateral knee pain 11/13/2012  . Left knee pain 11/13/2012  . Chronic gout of knee due to drug 11/13/2012  . Urinary frequency 07/20/2012  . Urinary retention 07/20/2012  . Hip pain, acute 07/07/2012  . Preventative health care 02/28/2011  . SKIN LESION 05/26/2009  . LIBIDO, DECREASED 05/26/2009  . BACK PAIN 04/01/2009  . GOUT 10/30/2007  . Nocturia 09/04/2007  . FATIGUE 08/20/2007  . ALLERGIC RHINITIS 05/01/2007  . Venous insufficiency of both lower extremities 03/09/2007  . DIVERTICULOSIS, COLON 03/09/2007  . COLONIC POLYPS, HX OF 03/09/2007  . Essential hypertension 10/31/2006    Immunization History  Administered Date(s) Administered  . Fluad Quad(high Dose 65+) 11/26/2018  . H1N1 03/30/2008  . Influenza  Split 01/18/2012, 11/11/2013  . Influenza Whole  01/14/2007, 01/11/2009  . Influenza, High Dose Seasonal PF 01/03/2016  . Influenza-Unspecified 02/02/2015, 01/04/2017, 01/02/2018, 12/08/2019  . PFIZER(Purple Top)SARS-COV-2 Vaccination 04/19/2019, 05/12/2019, 12/08/2019  . Pneumococcal Conjugate-13 08/27/2013  . Pneumococcal Polysaccharide-23 12/10/2013  . Td 10/19/2007  . Tdap 05/09/2018  . Zoster 04/01/2009  . Zoster Recombinat (Shingrix) 01/22/2019, 06/24/2019    Conditions to be addressed/monitored:  Hypertension, Hyperlipidemia, GERD, BPH and Gout  Care Plan : General Pharmacy (Adult)  Updates made by Germaine Pomfret, RPH since 06/14/2020 12:00 AM    Problem: Hypertension, Hyperlipidemia, GERD, BPH and Gout   Priority: High    Long-Range Goal: Patient-Specific Goal   Start Date: 06/14/2020  Expected End Date: 12/14/2020  This Visit's Progress: On track  Priority: High  Note:   Current Barriers:  . Unable to achieve control of cholesterol   Pharmacist Clinical Goal(s):  Marland Kitchen Patient will achieve control of cholesterol as evidenced by LDL less than 70 through collaboration with PharmD and provider.   Interventions: . 1:1 collaboration with Nche, Charlene Brooke, NP regarding development and update of comprehensive plan of care as evidenced by provider attestation and co-signature . Inter-disciplinary care team collaboration (see longitudinal plan of care) . Comprehensive medication review performed; medication list updated in electronic medical record  Hypertension (BP goal <140/90) -Controlled -Current treatment: . Irbesartan 300 mg daily  . Verapamil CR 240 mg at bedtime  -Medications previously tried: Amlodipine, HCTZ, valsartan   -Current home readings: Stopped monitoring, home monitor was inacurrate -Current meal patterns:  . breakfast: Orange + Banana   . lunch: Oatmeal + Berries + flaxseed + protein mix  . dinner: Meat + two serviings of vegetables . snacks:  Grapes . drinks: Mainly water  -Current exercise habits: going to gym 2-3 times weekly -Denies hypotensive/hypertensive symptoms -Educated on Importance of home blood pressure monitoring; Proper BP monitoring technique; discussed purchasing larger blood pressure cuff to ensure accuracy -Counseled to monitor BP at home every one, document, and provide log at future appointments -Recommended to continue current medication  Hyperlipidemia: (LDL goal < 70) -Uncontrolled -Current treatment: . Fenofibrate 54 mg daily  . Pravastatin 20 mg daily  -Medications previously tried: NA  -Educated on Importance of limiting foods high in cholesterol;  -Given ASCVD >20%, patient would be candidate for high-intensity statin  -Recommended STOPPING fenofibrate due to increased risk of myalgias with statin use -Recommend STOPPING pravastatin -Recommend STARTING rosuvastatin 20 mg daily   Gout (Goal: Prevent flares) -Controlled -Current treatment  . Allopurinol 300 mg daily  -Medications previously tried: Colchicine, Indomethacin,  -Last flare: Patient felt he may have been having the start of a flare in his knee, but treated with acetaminophen + Voltaren gel and the inflammation improved. Counseled patient on low-purine diet plan, avoiding alcohol use   -Recommended to continue current medication  BPH (Goal: Maintain stable urinary flow) -Controlled -Current treatment  . Finasteride 5 mg daily . Tamsulosin 0.4 mg daily  -Medications previously tried: NA  -Recommended to continue current medication  GERD (Goal: Prevent symptoms of heartburn or reflux) -Controlled -Current treatment  . Pantoprazole 40 mg daily  -Medications previously tried: NA  -Recommended to continue current medication  Patient Goals/Self-Care Activities . Patient will:  - check blood pressure every 1-2 weeks, document, and provide at future appointments  Follow Up Plan: Face to Face appointment with care management team  member scheduled for: 12/14/2020 at 11:00 AM      Medication Assistance: None required.  Patient affirms current coverage meets needs.  Patient's preferred pharmacy is:  [facility] [place], [place] [address] Phone: [phone] Fax: [place], [place] - [address] [place] [address] Phone: [phone] Fax: [address], [place], [address] [place], [place] [address] Phone: [phone] Fax: [phone]  [place], [place] [address] Phone: [phone] Fax: [phone]  Uses pill box? Yes Pt endorses 100% compliance  We discussed: Current pharmacy is preferred with insurance plan and patient is satisfied with pharmacy services Patient decided to: Continue current medication management strategy  Care Plan and Follow Up Patient Decision:  Patient agrees to Care Plan and Follow-up.  Plan: Face to Face appointment with care management team member scheduled for: 12/14/2020 at 11:00 AM  Junius Argyle, PharmD, CPP Clinical Pharmacist [facility]  [phone]

## 2020-06-14 NOTE — Patient Instructions (Signed)
Visit Information It was great speaking with you today!  Please let me know if you have any questions about our visit.  Goals Addressed            This Visit's Progress   . Track and Manage My Blood Pressure-Hypertension       Timeframe:  Long-Range Goal Priority:  High Start Date:   06/14/2020                          Expected End Date:  12/14/2020                     Follow Up Date 09/09/2020    - check blood pressure weekly    Why is this important?    You won't feel high blood pressure, but it can still hurt your blood vessels.   High blood pressure can cause heart or kidney problems. It can also cause a stroke.   Making lifestyle changes like losing a little weight or eating less salt will help.   Checking your blood pressure at home and at different times of the day can help to control blood pressure.   If the doctor prescribes medicine remember to take it the way the doctor ordered.   Call the office if you cannot afford the medicine or if there are questions about it.     Notes:        Patient Care Plan: General Pharmacy (Adult)    Problem Identified: Hypertension, Hyperlipidemia, GERD, BPH and Gout   Priority: High    Long-Range Goal: Patient-Specific Goal   Start Date: 06/14/2020  Expected End Date: 12/14/2020  This Visit's Progress: On track  Priority: High  Note:   Current Barriers:  . Unable to achieve control of cholesterol   Pharmacist Clinical Goal(s):  Marland Kitchen Patient will achieve control of cholesterol as evidenced by LDL less than 70 through collaboration with PharmD and provider.   Interventions: . 1:1 collaboration with Nche, Charlene Brooke, NP regarding development and update of comprehensive plan of care as evidenced by provider attestation and co-signature . Inter-disciplinary care team collaboration (see longitudinal plan of care) . Comprehensive medication review performed; medication list updated in electronic medical record  Hypertension (BP  goal <140/90) -Controlled -Current treatment: . Irbesartan 300 mg daily  . Verapamil CR 240 mg at bedtime  -Medications previously tried: Amlodipine, HCTZ, valsartan   -Current home readings: Stopped monitoring, home monitor was inacurrate -Current meal patterns:  . breakfast: Orange + Banana   . lunch: Oatmeal + Berries + flaxseed + protein mix  . dinner: Meat + two serviings of vegetables . snacks: Grapes . drinks: Mainly water  -Current exercise habits: going to gym 2-3 times weekly -Denies hypotensive/hypertensive symptoms -Educated on Importance of home blood pressure monitoring; Proper BP monitoring technique; discussed purchasing larger blood pressure cuff to ensure accuracy -Counseled to monitor BP at home every one, document, and provide log at future appointments -Recommended to continue current medication  Hyperlipidemia: (LDL goal < 70) -Uncontrolled -Current treatment: . Fenofibrate 54 mg daily  . Pravastatin 20 mg daily  -Medications previously tried: NA  -Educated on Importance of limiting foods high in cholesterol;  -Given ASCVD >20%, patient would be candidate for high-intensity statin  -Recommended STOPPING fenofibrate due to increased risk of myalgias with statin use -Recommend STOPPING pravastatin -Recommend STARTING rosuvastatin 20 mg daily   Gout (Goal: Prevent flares) -Controlled -Current treatment  . Allopurinol 300  mg daily  -Medications previously tried: Colchicine, Indomethacin,  -Last flare: Patient felt he may have been having the start of a flare in his knee, but treated with acetaminophen + Voltaren gel and the inflammation improved. Counseled patient on low-purine diet plan, avoiding alcohol use   -Recommended to continue current medication  BPH (Goal: Maintain stable urinary flow) -Controlled -Current treatment  . Finasteride 5 mg daily . Tamsulosin 0.4 mg daily  -Medications previously tried: NA  -Recommended to continue current  medication  GERD (Goal: Prevent symptoms of heartburn or reflux) -Controlled -Current treatment  . Pantoprazole 40 mg daily  -Medications previously tried: NA  -Recommended to continue current medication  Patient Goals/Self-Care Activities . Patient will:  - check blood pressure every 1-2 weeks, document, and provide at future appointments  Follow Up Plan: Face to Face appointment with care management team member scheduled for: 12/14/2020 at 11:00 AM    Patient agreed to services and verbal consent obtained.   The patient verbalized understanding of instructions, educational materials, and care plan provided today and declined offer to receive copy of patient instructions, educational materials, and care plan.   Junius Argyle, PharmD, CPP Clinical Pharmacist [facility] at [facility]  [phone]

## 2020-06-14 NOTE — Addendum Note (Signed)
Addended by: Wilfred Lacy L on: 06/14/2020 12:48 PM   Modules accepted: Orders

## 2020-06-15 ENCOUNTER — Other Ambulatory Visit: Payer: Self-pay

## 2020-06-16 ENCOUNTER — Other Ambulatory Visit: Payer: Self-pay

## 2020-06-16 ENCOUNTER — Encounter: Payer: Self-pay | Admitting: Nurse Practitioner

## 2020-06-16 ENCOUNTER — Other Ambulatory Visit (INDEPENDENT_AMBULATORY_CARE_PROVIDER_SITE_OTHER): Payer: Medicare Other

## 2020-06-16 DIAGNOSIS — N401 Enlarged prostate with lower urinary tract symptoms: Secondary | ICD-10-CM

## 2020-06-16 DIAGNOSIS — R350 Frequency of micturition: Secondary | ICD-10-CM

## 2020-06-16 DIAGNOSIS — E782 Mixed hyperlipidemia: Secondary | ICD-10-CM | POA: Diagnosis not present

## 2020-06-16 LAB — LIPID PANEL
Cholesterol: 108 mg/dL (ref 0–200)
HDL: 22.8 mg/dL — ABNORMAL LOW
LDL Cholesterol: 74 mg/dL (ref 0–99)
NonHDL: 85.01
Total CHOL/HDL Ratio: 5
Triglycerides: 54 mg/dL (ref 0.0–149.0)
VLDL: 10.8 mg/dL (ref 0.0–40.0)

## 2020-06-16 LAB — CK: Total CK: 166 U/L (ref 7–232)

## 2020-06-16 LAB — PSA: PSA: 1.58 ng/mL (ref 0.10–4.00)

## 2020-06-30 DIAGNOSIS — G4733 Obstructive sleep apnea (adult) (pediatric): Secondary | ICD-10-CM | POA: Diagnosis not present

## 2020-07-13 DIAGNOSIS — G4733 Obstructive sleep apnea (adult) (pediatric): Secondary | ICD-10-CM | POA: Diagnosis not present

## 2020-07-30 DIAGNOSIS — G4733 Obstructive sleep apnea (adult) (pediatric): Secondary | ICD-10-CM | POA: Diagnosis not present

## 2020-08-12 ENCOUNTER — Telehealth: Payer: Self-pay | Admitting: Nurse Practitioner

## 2020-08-12 DIAGNOSIS — E782 Mixed hyperlipidemia: Secondary | ICD-10-CM

## 2020-08-12 MED ORDER — ROSUVASTATIN CALCIUM 20 MG PO TABS: 20.0000 mg | ORAL_TABLET | Freq: Every day | ORAL | 3 refills | Status: DC

## 2020-08-12 NOTE — Telephone Encounter (Signed)
-----   Message from Germaine Pomfret, [facility] sent at 06/14/2020 12:01 PM EDT ----- Regarding: CPP Pharmacist Recommendations Baldo Ash,   Mr. Guggenheim has a high risk of cardiovascular disease based on his ASCVD risk score of 25.5%. At your next follow-up visit, I would recommend stopping his fenofibrate and pravastatin and switching him to rosuvastatin 20 mg daily to target and LDL < 70 mg/dL.   Thanks, Junius Argyle, PharmD, CPP Clinical Pharmacist [facility] at [facility]  [phone]

## 2020-08-18 ENCOUNTER — Ambulatory Visit: Payer: Medicare Other | Admitting: Nurse Practitioner

## 2020-08-19 ENCOUNTER — Encounter: Payer: Self-pay | Admitting: Nurse Practitioner

## 2020-08-19 ENCOUNTER — Other Ambulatory Visit: Payer: Self-pay

## 2020-08-19 ENCOUNTER — Ambulatory Visit (INDEPENDENT_AMBULATORY_CARE_PROVIDER_SITE_OTHER): Payer: Medicare Other | Admitting: Nurse Practitioner

## 2020-08-19 VITALS — BP 130/76 | HR 64 | Temp 97.1°F | Ht 67.5 in | Wt 272.0 lb

## 2020-08-19 DIAGNOSIS — G4733 Obstructive sleep apnea (adult) (pediatric): Secondary | ICD-10-CM | POA: Diagnosis not present

## 2020-08-19 DIAGNOSIS — M25561 Pain in right knee: Secondary | ICD-10-CM | POA: Diagnosis not present

## 2020-08-19 DIAGNOSIS — E782 Mixed hyperlipidemia: Secondary | ICD-10-CM

## 2020-08-19 DIAGNOSIS — G8929 Other chronic pain: Secondary | ICD-10-CM | POA: Diagnosis not present

## 2020-08-19 DIAGNOSIS — M25562 Pain in left knee: Secondary | ICD-10-CM | POA: Diagnosis not present

## 2020-08-19 MED ORDER — ROSUVASTATIN CALCIUM 20 MG PO TABS: 20.0000 mg | ORAL_TABLET | Freq: Every day | ORAL | 3 refills | Status: DC

## 2020-08-19 MED ORDER — FENOFIBRATE 54 MG PO TABS: 54.0000 mg | ORAL_TABLET | Freq: Every day | ORAL | 3 refills | Status: DC

## 2020-08-19 NOTE — Assessment & Plan Note (Signed)
Calvin [name] has questions about need to change pravastatin to crestor. I advised him about his risk for cardiovascular disease at 21.8%. Advised about benefits of using a more potent statin to decrease his of CVD in combination with DASH diet and exercise. We also discussed possible side effects of medication. We verbalized understanding and agreed to start medication

## 2020-08-19 NOTE — Progress Notes (Signed)
Subjective:  Patient ID: Calvin [name]., male    DOB: 10-11-1950  Age: 70 y.o. MRN: [id number]  CC: Follow-up (6 month f/u on HTN, hyperlipidemia, and hyperglycemia. Pt is not fasting. Pt has some questions regarding Crestor medication, he has not picked up medication yet. )  HPI  Mixed hyperlipidemia [name] has questions about need to change pravastatin to crestor. I advised him about his risk for cardiovascular disease at 21.8%. Advised about benefits of using a more potent statin to decrease his of CVD in combination with DASH diet and exercise. We also discussed possible side effects of medication. We verbalized understanding and agreed to start medication  Bilateral knee pain L> R, chronic, waxing and waning, secondary to OA Worse with increase weight bearing activity. Improves with use of tylenol and voltaren 1% gel prn He had previous eval and joint injection by sports medicine.  He has a knee brace but has not used. He denies any recent knee injury.  Advised to continue use of voltaren gel and tylenol as needed See knee brace during weight bearing exercise Also consider non weight bearing exercise like water aerobics and stationary bicycle for exercise Schedule appt with sports medicine if pain worsens of you develop swelling   Reviewed past Medical, Social and Family history today.  Outpatient Medications Prior to Visit  Medication Sig Dispense Refill  . acetaminophen (TYLENOL) 500 MG tablet Take 1,000 mg by mouth every 6 (six) hours as needed.    Marland Kitchen allopurinol (ZYLOPRIM) 300 MG tablet TAKE 1 TABLET BY MOUTH  DAILY 90 tablet 3  . aspirin 81 MG tablet Take 81 mg by mouth daily.    . Blood Pressure Monitoring (B-D ASSURE BPM/AUTO ARM CUFF) MISC 1 Units by Does not apply route daily. Arm circumference: 40cm 1 each 0  . diclofenac Sodium (VOLTAREN) 1 % GEL Apply 2 g topically 4 (four) times daily as needed.    . fluticasone (FLONASE) 50 MCG/ACT nasal spray Place 2  sprays into both nostrils daily.    . irbesartan (AVAPRO) 300 MG tablet TAKE 1 TABLET BY MOUTH  DAILY 90 tablet 3  . loratadine (CLARITIN) 10 MG tablet Take 10 mg by mouth daily.    . pantoprazole (PROTONIX) 40 MG tablet TAKE 1 TABLET BY MOUTH  DAILY 90 tablet 1  . sildenafil (VIAGRA) 100 MG tablet Take 1 tablet (100 mg total) by mouth as needed for erectile dysfunction. Yearly physical w/labs due in February must see MD for refills 10 tablet 0  . tamsulosin (FLOMAX) 0.4 MG CAPS capsule TAKE 1 CAPSULE BY MOUTH  DAILY 90 capsule 3  . verapamil (CALAN-SR) 240 MG CR tablet TAKE 1 TABLET BY MOUTH AT  BEDTIME 90 tablet 3  . fenofibrate 54 MG tablet Take 1 tablet (54 mg total) by mouth daily. 90 tablet 3  . pravastatin (PRAVACHOL) 20 MG tablet Take 20 mg by mouth daily.    . finasteride (PROSCAR) 5 MG tablet TAKE 1 TABLET BY MOUTH  DAILY (Patient not taking: Reported on 08/19/2020) 90 tablet 3  . FLUZONE HIGH-DOSE QUADRIVALENT 0.7 ML SUSY  (Patient not taking: Reported on 08/19/2020)    . rosuvastatin (CRESTOR) 20 MG tablet Take 1 tablet (20 mg total) by mouth daily. (Patient not taking: Reported on 08/19/2020) 90 tablet 3   No facility-administered medications prior to visit.    ROS See HPI  Objective:  BP 130/76 (BP Location: Left Arm, Patient Position: Sitting, Cuff Size: Large)   Pulse  64   Temp (!) 97.1 F (36.2 C) (Temporal)   Ht 5' 7.5" (1.715 m)   Wt 272 lb (123.4 kg)   SpO2 99%   BMI 41.97 kg/m   Physical Exam Constitutional:      Appearance: He is obese.  Cardiovascular:     Rate and Rhythm: Normal rate.     Pulses: Normal pulses.  Pulmonary:     Effort: Pulmonary effort is normal.  Musculoskeletal:        General: No swelling or tenderness.     Right knee: Crepitus present. No deformity, effusion or bony tenderness. Normal range of motion. No tenderness. Normal alignment. Normal pulse.     Left knee: Crepitus present. No deformity, effusion or bony tenderness. Normal range of  motion. No tenderness. Normal alignment. Normal pulse.     Right lower leg: Normal. No edema.     Left lower leg: Normal. No edema.  Neurological:     Mental Status: He is alert.   Assessment & Plan:  This visit occurred during the SARS-CoV-2 public health emergency.  Safety protocols were in place, including screening questions prior to the visit, additional usage of staff PPE, and extensive cleaning of exam room while observing appropriate contact time as indicated for disinfecting solutions.   Shandon was seen today for follow-up.  Diagnoses and all orders for this visit:  Mixed hyperlipidemia -     rosuvastatin (CRESTOR) 20 MG tablet; Take 1 tablet (20 mg total) by mouth daily. -     fenofibrate 54 MG tablet; Take 1 tablet (54 mg total) by mouth daily.  Chronic pain of both knees  Problem List Items Addressed This Visit       Other   Bilateral knee pain    L> R, chronic, waxing and waning, secondary to OA Worse with increase weight bearing activity. Improves with use of tylenol and voltaren 1% gel prn He had previous eval and joint injection by sports medicine.  He has a knee brace but has not used. He denies any recent knee injury.  Advised to continue use of voltaren gel and tylenol as needed See knee brace during weight bearing exercise Also consider non weight bearing exercise like water aerobics and stationary bicycle for exercise Schedule appt with sports medicine if pain worsens of you develop swelling       Mixed hyperlipidemia - Primary    [name] has questions about need to change pravastatin to crestor. I advised him about his risk for cardiovascular disease at 21.8%. Advised about benefits of using a more potent statin to decrease his of CVD in combination with DASH diet and exercise. We also discussed possible side effects of medication. We verbalized understanding and agreed to start medication       Relevant Medications   rosuvastatin (CRESTOR) 20  MG tablet   fenofibrate 54 MG tablet    Follow-up: Return in about 6 months (around 02/18/2021) for HTN and , hyperlipidemia (fasting, 48mins).  Wilfred Lacy, NP

## 2020-08-19 NOTE — Assessment & Plan Note (Addendum)
L> R, chronic, waxing and waning, secondary to OA Worse with increase weight bearing activity. Improves with use of tylenol and voltaren 1% gel prn He had previous eval and joint injection by sports medicine.  He has a knee brace but has not used. He denies any recent knee injury.  Advised to continue use of voltaren gel and tylenol as needed See knee brace during weight bearing exercise Also consider non weight bearing exercise like water aerobics and stationary bicycle for exercise Schedule appt with sports medicine if pain worsens of you develop swelling

## 2020-08-19 NOTE — Patient Instructions (Signed)
D/c pravastatin and start crestor Continue fenofibrate 54mg  All current prescriptions were sent to [facility] [place] to continue voltaren gel 1% 3-4x/day and tylenol as needed for knee pain Use knee brace. Participate in water aerobics or use recumbent bicycle for exercise.

## 2020-08-23 NOTE — Progress Notes (Signed)
[facility] [address] [place] Phone: [phone] Subjective:   I Kandace Blitz am serving as a Education administrator for Dr. Hulan Saas.  This visit occurred during the SARS-CoV-2 public health emergency.  Safety protocols were in place, including screening questions prior to the visit, additional usage of staff PPE, and extensive cleaning of exam room while observing appropriate contact time as indicated for disinfecting solutions.   I'm seeing this patient by the request  of:  Nche, Charlene Brooke, NP  CC: Bilateral knee pain  OIB:[id number]  Tarvares Lant. is a 70 y.o. male coming in with complaint of B knee pain. Last seen in 2019 for B knee pain. Patient states the knee has improved since he made the appointment. Using voltaren about 3 times a day which has helped the pain. Also takes tylenol. Medial left sided knee pain and patellar. States it is warm to the touch sometimes. Left is worse. Pops when he walks.     Patient given injections previously   Last x-rays from September 2015 showing severe arthritic changes left greater than right.  Past Medical History:  Diagnosis Date   Allergy    Arthritis    Diverticulosis    Gout    Hyperlipidemia    Hypertension    Iron deficiency anemia 09/20/2018   OSA (obstructive sleep apnea) 11/13/2013   Sickle cell trait ([place])    Sleep apnea    Past Surgical History:  Procedure Laterality Date   COLONOSCOPY     POLYPECTOMY     ROTATOR CUFF REPAIR     left   THROAT SURGERY     polyps removed from vocal cord   Social History   Socioeconomic History   Marital status: Married    Spouse name: Not on file   Number of children: 1   Years of education: Not on file   Highest education level: Not on file  Occupational History   Occupation: retired  Tobacco Use   Smoking status: Former    Packs/day: 2.00    Years: 5.00    Pack years: 10.00    Types: Cigarettes    Quit date: 05/23/1977     Years since quitting: 43.2   Smokeless tobacco: Never  Vaping Use   Vaping Use: Never used  Substance and Sexual Activity   Alcohol use: No    Comment: QUIT 2002   Drug use: No    Comment: Completed program in april 2000   Sexual activity: Yes  Other Topics Concern   Not on file  Social History Narrative   Not on file   Social Determinants of Health   Financial Resource Strain: Low Risk    Difficulty of Paying Living Expenses: Not hard at all  Food Insecurity: No Food Insecurity   Worried About Charity fundraiser in the Last Year: Never true   [place] in the Last Year: Never true  Transportation Needs: No Transportation Needs   Lack of Transportation (Medical): No   Lack of Transportation (Non-Medical): No  Physical Activity: Sufficiently Active   Days of Exercise per Week: 3 days   Minutes of Exercise per Session: 60 min  Stress: No Stress Concern Present   Feeling of Stress : Not at all  Social Connections: Socially Integrated   Frequency of Communication with Friends and Family: More than three times a week   Frequency of Social Gatherings with Friends and Family: Twice a week  Attends Religious Services: More than 4 times per year   Active Member of Clubs or Organizations: Yes   Attends Archivist Meetings: More than 4 times per year   Marital Status: Married   No Known Allergies Family History  Problem Relation Age of Onset   Colon cancer Neg Hx    Esophageal cancer Neg Hx    Rectal cancer Neg Hx    Stomach cancer Neg Hx      Current Outpatient Medications (Cardiovascular):    fenofibrate 54 MG tablet, Take 1 tablet (54 mg total) by mouth daily.   irbesartan (AVAPRO) 300 MG tablet, TAKE 1 TABLET BY MOUTH  DAILY   rosuvastatin (CRESTOR) 20 MG tablet, Take 1 tablet (20 mg total) by mouth daily.   sildenafil (VIAGRA) 100 MG tablet, Take 1 tablet (100 mg total) by mouth as needed for erectile dysfunction. Yearly physical w/labs due in February  must see MD for refills   verapamil (CALAN-SR) 240 MG CR tablet, TAKE 1 TABLET BY MOUTH AT  BEDTIME  Current Outpatient Medications (Respiratory):    fluticasone (FLONASE) 50 MCG/ACT nasal spray, Place 2 sprays into both nostrils daily.   loratadine (CLARITIN) 10 MG tablet, Take 10 mg by mouth daily.  Current Outpatient Medications (Analgesics):    acetaminophen (TYLENOL) 500 MG tablet, Take 1,000 mg by mouth every 6 (six) hours as needed.   allopurinol (ZYLOPRIM) 300 MG tablet, TAKE 1 TABLET BY MOUTH  DAILY   aspirin 81 MG tablet, Take 81 mg by mouth daily.   Current Outpatient Medications (Other):    Blood Pressure Monitoring (B-D ASSURE BPM/AUTO ARM CUFF) MISC, 1 Units by Does not apply route daily. Arm circumference: 40cm   diclofenac Sodium (VOLTAREN) 1 % GEL, Apply 2 g topically 4 (four) times daily as needed.   pantoprazole (PROTONIX) 40 MG tablet, TAKE 1 TABLET BY MOUTH  DAILY   tamsulosin (FLOMAX) 0.4 MG CAPS capsule, TAKE 1 CAPSULE BY MOUTH  DAILY   Reviewed prior external information including notes and imaging from  primary care provider As well as notes that were available from care everywhere and other healthcare systems.  Past medical history, social, surgical and family history all reviewed in electronic medical record.  No pertanent information unless stated regarding to the chief complaint.   Review of Systems:  No headache, visual changes, nausea, vomiting, diarrhea, constipation, dizziness, abdominal pain, skin rash, fevers, chills, night sweats, weight loss, swollen lymph nodes, body aches, joint swelling, chest pain, shortness of breath, mood changes. POSITIVE muscle aches  Objective  Blood pressure 130/74, pulse 71, height 5' 7.5" (1.715 m), weight 260 lb (117.9 kg), SpO2 97 %.   General: No apparent distress alert and oriented x3 mood and affect normal, dressed appropriately.  Overweight HEENT: Pupils equal, extraocular movements intact  Respiratory:  Patient's speak in full sentences and does not appear short of breath  Cardiovascular: No lower extremity edema, non tender, no erythema  Gait antalgic MSK:   Knee:bilateral valgus deformity noted. Large thigh to calf ratio.  Tender to palpation over medial and PF joint line.  instability with valgus force.  painful patellar compression. Patellar glide with moderate crepitus. Patellar and quadriceps tendons unremarkable. Hamstring and quadriceps strength is normal.  Limited musculoskeletal ultrasound was performed and interpreted by Lyndal Pulley  Limited ultrasound of patient's left knee shows the patient does have an effusion noted.  Severe narrowing of the medial and patellofemoral joint space noted.  No cortical defects noted noted.  Impression: End-stage arthritic changes of the left knee     Impression and Recommendations:     The above documentation has been reviewed and is accurate and complete Lyndal Pulley, DO

## 2020-08-24 ENCOUNTER — Other Ambulatory Visit: Payer: Self-pay

## 2020-08-24 ENCOUNTER — Encounter: Payer: Self-pay | Admitting: Family Medicine

## 2020-08-24 ENCOUNTER — Ambulatory Visit: Payer: Medicare Other | Admitting: Family Medicine

## 2020-08-24 ENCOUNTER — Ambulatory Visit: Payer: Self-pay

## 2020-08-24 ENCOUNTER — Ambulatory Visit (INDEPENDENT_AMBULATORY_CARE_PROVIDER_SITE_OTHER): Payer: Medicare Other

## 2020-08-24 VITALS — BP 130/74 | HR 71 | Ht 67.5 in | Wt 260.0 lb

## 2020-08-24 DIAGNOSIS — G8929 Other chronic pain: Secondary | ICD-10-CM

## 2020-08-24 DIAGNOSIS — M25561 Pain in right knee: Secondary | ICD-10-CM

## 2020-08-24 DIAGNOSIS — M7989 Other specified soft tissue disorders: Secondary | ICD-10-CM | POA: Diagnosis not present

## 2020-08-24 DIAGNOSIS — M170 Bilateral primary osteoarthritis of knee: Secondary | ICD-10-CM

## 2020-08-24 DIAGNOSIS — M25562 Pain in left knee: Secondary | ICD-10-CM

## 2020-08-24 NOTE — Patient Instructions (Signed)
Good to see you Left knee xray today Exercise 3 times a week Voltaren 2 times a day and tylenol as needed Ice 20 mins 2 times a day See me again in 2-3 months

## 2020-08-24 NOTE — Assessment & Plan Note (Addendum)
Known degenerative changes of the knees.  Patient though has done relatively well with conservative therapy over the years.  Discussed getting an x-ray of the left knee.  Definitely could consider potential injection which patient declined.  Discussed topical anti-inflammatories and Tylenol.  Encouraged weight loss.  Follow-up with me again 6 weeks and see how patient is responding.  Has had a history of gout that also could be playing a role but on ultrasound today did not see any significant changes consistent with that.  Any worsening pain consider injection at follow-up

## 2020-08-30 DIAGNOSIS — G4733 Obstructive sleep apnea (adult) (pediatric): Secondary | ICD-10-CM | POA: Diagnosis not present

## 2020-09-08 DIAGNOSIS — H524 Presbyopia: Secondary | ICD-10-CM | POA: Diagnosis not present

## 2020-09-24 ENCOUNTER — Telehealth: Payer: Self-pay

## 2020-09-24 NOTE — Progress Notes (Signed)
Chronic Care Management Pharmacy Assistant   Name: Calvin [name].  MRN: [id number] DOB: 1950/12/01  Reason for Encounter:Hypertension and Hyperlipidemia Disease State Call.   Recent office visits:  08/19/2020 Wilfred Lacy NP (PCP)  08/12/2020 Wilfred Lacy NP (PCP) stopping his fenofibrate and pravastatin and switching him to rosuvastatin 20 mg daily  Recent consult visits:  08/24/2020 Dr.Smith MD ([facility] visits:  None in previous 6 months  Medications: Outpatient Encounter Medications as of 09/24/2020  Medication Sig   acetaminophen (TYLENOL) 500 MG tablet Take 1,000 mg by mouth every 6 (six) hours as needed.   allopurinol (ZYLOPRIM) 300 MG tablet TAKE 1 TABLET BY MOUTH  DAILY   aspirin 81 MG tablet Take 81 mg by mouth daily.   Blood Pressure Monitoring (B-D ASSURE BPM/AUTO ARM CUFF) MISC 1 Units by Does not apply route daily. Arm circumference: 40cm   diclofenac Sodium (VOLTAREN) 1 % GEL Apply 2 g topically 4 (four) times daily as needed.   fenofibrate 54 MG tablet Take 1 tablet (54 mg total) by mouth daily.   fluticasone (FLONASE) 50 MCG/ACT nasal spray Place 2 sprays into both nostrils daily.   irbesartan (AVAPRO) 300 MG tablet TAKE 1 TABLET BY MOUTH  DAILY   loratadine (CLARITIN) 10 MG tablet Take 10 mg by mouth daily.   pantoprazole (PROTONIX) 40 MG tablet TAKE 1 TABLET BY MOUTH  DAILY   rosuvastatin (CRESTOR) 20 MG tablet Take 1 tablet (20 mg total) by mouth daily.   sildenafil (VIAGRA) 100 MG tablet Take 1 tablet (100 mg total) by mouth as needed for erectile dysfunction. Yearly physical w/labs due in February must see MD for refills   tamsulosin (FLOMAX) 0.4 MG CAPS capsule TAKE 1 CAPSULE BY MOUTH  DAILY   verapamil (CALAN-SR) 240 MG CR tablet TAKE 1 TABLET BY MOUTH AT  BEDTIME   No facility-administered encounter medications on file as of 09/24/2020.    Care Gaps: COVID-19 Vaccine Star Rating Drugs: Rosuvastatin 20 mg last filled  08/12/2020 for 90 day supply at [facility] Irbesartan 300 mg last filled 06/01/2020 for 90 day supply at [facility]. Medication Fill Gaps: Allopurinol 300 MG last filled 05/30/2020 for 90 day supply at [facility]. Pantoprazole 40 MG  last filled 05/31/2020 for 90 day supply at [facility]. Tamsulosin 0.4 MG last filled 05/30/2020 for 90 day supply at [facility]. Verapamil 240 MG CR last filled 05/30/2020 for 90 day supply at [facility] chart prior to disease state call. Spoke with patient regarding BP  Recent Office Vitals: BP Readings from Last 3 Encounters:  08/24/20 130/74  08/19/20 130/76  04/26/20 135/76   Pulse Readings from Last 3 Encounters:  08/24/20 71  08/19/20 64  04/26/20 66    Wt Readings from Last 3 Encounters:  08/24/20 260 lb (117.9 kg)  08/19/20 272 lb (123.4 kg)  06/14/20 261 lb 3.2 oz (118.5 kg)     Kidney Function Lab Results  Component Value Date/Time   CREATININE 1.46 (H) 04/19/2020 09:50 AM   CREATININE 1.43 02/02/2020 10:55 AM   CREATININE 1.53 (H) 10/20/2019 10:09 AM   GFR 49.31 (L) 02/02/2020 10:55 AM   GFRNONAA 51 (L) 04/19/2020 09:50 AM   GFRAA 52 (L) 10/20/2019 10:09 AM    BMP Latest Ref Rng & Units 04/19/2020 02/02/2020 10/20/2019  Glucose 70 - 99 mg/dL 100(H) 85 117(H)  BUN 8 - 23 mg/dL 14 19 16   Creatinine 0.61 - 1.24 mg/dL 1.46(H)  1.43 1.53(H)  Sodium 135 - 145 mmol/L 141 142 142  Potassium 3.5 - 5.1 mmol/L 3.9 4.0 3.9  Chloride 98 - 111 mmol/L 109 108 109  CO2 22 - 32 mmol/L 26 27 24   Calcium 8.9 - 10.3 mg/dL 8.7(L) 9.1 9.4    Current antihypertensive regimen:  Irbesartan 300 mg daily Verapamil CR 240 mg at bedtime   Patient denies headaches,dizziness or lightheadedness. How often are you checking your Blood Pressure?  Patient states he is not checking his blood pressure at home because his monitor was inaccurate. Informed patient of clinical pharmacist recommendation per chart note on 06/14/2020  "purchasing larger blood pressure cuff to ensure accuracy". Patient Verbalized understanding. Current home BP readings: None ID What recent interventions/DTPs have been made by any provider to improve Blood Pressure control since last CPP Visit: None ID Any recent hospitalizations or ED visits since last visit with CPP? No What diet changes have been made to improve Blood Pressure Control?  Patient states he has no changes in his diet. What exercise is being done to improve your Blood Pressure Control?  Patient states he walks and goes to the gym 2-3 times a week.  Adherence Review: Is the patient currently on ACE/ARB medication? Yes Does the patient have >5 day gap between last estimated fill dates? Yes  09/24/2020 Name: Calvin [name]. MRN: [id number] DOB: Jul 12, 1950 Calvin [name]. is a 70 y.o. year old male who is a primary care patient of Nche, Charlene Brooke, NP.  Comprehensive medication review performed; Spoke to patient regarding cholesterol  Lipid Panel    Component Value Date/Time   CHOL 108 06/16/2020 0856   TRIG 54.0 06/16/2020 0856   TRIG 41 02/12/2006 0738   HDL 22.80 (L) 06/16/2020 0856   LDLCALC 74 06/16/2020 0856   LDLCALC 64 01/22/2019 1421   LDLDIRECT 61.0 11/26/2018 1036    10-year ASCVD risk score: The ASCVD Risk score Mikey Bussing DC Jr., et al., 2013) failed to calculate for the following reasons:   The valid total cholesterol range is 130 to 320 mg/dL  Current antihyperlipidemic regimen:  rosuvastatin 20 mg daily Patient denies any issue or side effects from rosuvastatin.Patient states "I made the changes my PCP recommend". Previous antihyperlipidemic medications tried: Fenofibrate 54 mg daily Pravastatin 20 mg daily ASCVD risk enhancing conditions: age >71 and HTN What recent interventions/DTPs have been made by any provider to improve Cholesterol control since last CPP Visit:  08/12/2020 Wilfred Lacy NP (PCP) stopping his fenofibrate and pravastatin  and switching him to rosuvastatin 20 mg daily Any recent hospitalizations or ED visits since last visit with CPP? No What diet changes have been made to improve Cholesterol?  Patient states he has no changes in his diet. What exercise is being done to improve Cholesterol?  Patient states he walks and goes to the gym 2-3 times a week.  Adherence Review: Does the patient have >5 day gap between last estimated fill dates? No    Face to Face appointment with care management team member scheduled for: 12/14/2020 at 11:00 AM  [place] Pharmacist Assistant [phone]

## 2020-09-29 DIAGNOSIS — G4733 Obstructive sleep apnea (adult) (pediatric): Secondary | ICD-10-CM | POA: Diagnosis not present

## 2020-10-25 ENCOUNTER — Inpatient Hospital Stay: Payer: Medicare Other | Attending: Hematology and Oncology

## 2020-10-25 DIAGNOSIS — D472 Monoclonal gammopathy: Secondary | ICD-10-CM | POA: Insufficient documentation

## 2020-10-26 DIAGNOSIS — G4733 Obstructive sleep apnea (adult) (pediatric): Secondary | ICD-10-CM | POA: Diagnosis not present

## 2020-10-30 DIAGNOSIS — G4733 Obstructive sleep apnea (adult) (pediatric): Secondary | ICD-10-CM | POA: Diagnosis not present

## 2020-11-01 ENCOUNTER — Inpatient Hospital Stay: Payer: Medicare Other | Admitting: Hematology and Oncology

## 2020-11-05 ENCOUNTER — Other Ambulatory Visit: Payer: Self-pay

## 2020-11-05 ENCOUNTER — Inpatient Hospital Stay: Payer: Medicare Other

## 2020-11-05 DIAGNOSIS — D472 Monoclonal gammopathy: Secondary | ICD-10-CM

## 2020-11-05 LAB — CMP (CANCER CENTER ONLY)
ALT: 15 U/L (ref 0–44)
AST: 18 U/L (ref 15–41)
Albumin: 4.2 g/dL (ref 3.5–5.0)
Alkaline Phosphatase: 55 U/L (ref 38–126)
Anion gap: 9 (ref 5–15)
BUN: 22 mg/dL (ref 8–23)
CO2: 23 mmol/L (ref 22–32)
Calcium: 9.1 mg/dL (ref 8.9–10.3)
Chloride: 110 mmol/L (ref 98–111)
Creatinine: 1.6 mg/dL — ABNORMAL HIGH (ref 0.61–1.24)
GFR, Estimated: 45 mL/min — ABNORMAL LOW (ref >60)
Glucose, Bld: 109 mg/dL — ABNORMAL HIGH (ref 70–99)
Potassium: 3.9 mmol/L (ref 3.5–5.1)
Sodium: 142 mmol/L (ref 135–145)
Total Bilirubin: 0.5 mg/dL (ref 0.3–1.2)
Total Protein: 7.3 g/dL (ref 6.5–8.1)

## 2020-11-05 LAB — CBC WITH DIFFERENTIAL (CANCER CENTER ONLY)
Abs Immature Granulocytes: 0 10*3/uL (ref 0.00–0.07)
Basophils Absolute: 0 10*3/uL (ref 0.0–0.1)
Basophils Relative: 0 %
Eosinophils Absolute: 0 10*3/uL (ref 0.0–0.5)
Eosinophils Relative: 1 %
HCT: 33.2 % — ABNORMAL LOW (ref 39.0–52.0)
Hemoglobin: 11.4 g/dL — ABNORMAL LOW (ref 13.0–17.0)
Immature Granulocytes: 0 %
Lymphocytes Relative: 35 %
Lymphs Abs: 1.1 10*3/uL (ref 0.7–4.0)
MCH: 30.1 pg (ref 26.0–34.0)
MCHC: 34.3 g/dL (ref 30.0–36.0)
MCV: 87.6 fL (ref 80.0–100.0)
Monocytes Absolute: 0.2 10*3/uL (ref 0.1–1.0)
Monocytes Relative: 8 %
Neutro Abs: 1.8 10*3/uL (ref 1.7–7.7)
Neutrophils Relative %: 56 %
Platelet Count: 116 10*3/uL — ABNORMAL LOW (ref 150–400)
RBC: 3.79 MIL/uL — ABNORMAL LOW (ref 4.22–5.81)
RDW: 13.3 % (ref 11.5–15.5)
WBC Count: 3.2 10*3/uL — ABNORMAL LOW (ref 4.0–10.5)
nRBC: 0 % (ref 0.0–0.2)

## 2020-11-05 LAB — LACTATE DEHYDROGENASE: LDH: 158 U/L (ref 98–192)

## 2020-11-08 LAB — KAPPA/LAMBDA LIGHT CHAINS
Kappa free light chain: 56 mg/L — ABNORMAL HIGH (ref 3.3–19.4)
Kappa, lambda light chain ratio: 2.92 — ABNORMAL HIGH (ref 0.26–1.65)
Lambda free light chains: 19.2 mg/L (ref 5.7–26.3)

## 2020-11-09 ENCOUNTER — Ambulatory Visit: Payer: Medicare Other | Admitting: Family Medicine

## 2020-11-09 ENCOUNTER — Other Ambulatory Visit: Payer: Self-pay

## 2020-11-09 ENCOUNTER — Encounter: Payer: Self-pay | Admitting: Family Medicine

## 2020-11-09 DIAGNOSIS — M170 Bilateral primary osteoarthritis of knee: Secondary | ICD-10-CM

## 2020-11-09 LAB — MULTIPLE MYELOMA PANEL, SERUM
Albumin SerPl Elph-Mcnc: 3.8 g/dL (ref 2.9–4.4)
Albumin/Glob SerPl: 1.4 (ref 0.7–1.7)
Alpha 1: 0.2 g/dL (ref 0.0–0.4)
Alpha2 Glob SerPl Elph-Mcnc: 0.5 g/dL (ref 0.4–1.0)
B-Globulin SerPl Elph-Mcnc: 0.9 g/dL (ref 0.7–1.3)
Gamma Glob SerPl Elph-Mcnc: 1.2 g/dL (ref 0.4–1.8)
Globulin, Total: 2.8 g/dL (ref 2.2–3.9)
IgA: 166 mg/dL (ref 61–437)
IgG (Immunoglobin G), Serum: 1254 mg/dL (ref 603–1613)
IgM (Immunoglobulin M), Srm: 104 mg/dL (ref 15–143)
M Protein SerPl Elph-Mcnc: 0.3 g/dL — ABNORMAL HIGH
Total Protein ELP: 6.6 g/dL (ref 6.0–8.5)

## 2020-11-09 NOTE — Assessment & Plan Note (Signed)
Significant arthritic changes.  Patient was doing well.  He does have the underlying gout that could be contributing patient is losing weight and I do encourage him to continue to do so.  Discussed icing regimen and home exercises.  Increase activity slowly.  Follow-up with me again in 3 months.

## 2020-11-09 NOTE — Progress Notes (Signed)
[facility] [place] Phone: [phone] Subjective:   Calvin [name], am serving as a scribe for Dr. Hulan Saas.  This visit occurred during the SARS-CoV-2 public health emergency.  Safety protocols were in place, including screening questions prior to the visit, additional usage of staff PPE, and extensive cleaning of exam room while observing appropriate contact time as indicated for disinfecting solutions.   I'm seeing this patient by the request  of:  Nche, Charlene Brooke, NP  CC: Bilateral knee pain  [id number]  08/24/2020 Known degenerative changes of the knees.  Patient though has done relatively well with conservative therapy over the years.  Discussed getting an x-ray of the left knee.  Definitely could consider potential injection which patient declined.  Discussed topical anti-inflammatories and Tylenol.  Encouraged weight loss.  Follow-up with me again 6 weeks and see how patient is responding.  Has had a history of gout that also could be playing a role but on ultrasound today did not see any significant changes consistent with that.  Any worsening pain consider injection at follow-up  Update 11/09/2020 Calvin [name]. is a 70 y.o. male coming in with complaint of B knee pain. Patient states that his pain is intermittent. Has been using Voltaren gel for pain relief as well as Tylenol.  Patient has had changes in previously.  Describes the pain as a dull, throbbing aching pain.  Patient states that it is very minimal.  He continues to use the topical anti-inflammatories and very minorly mild Tylenol.  Otherwise nothing significant.  Nothing that is stopping him from activity.  Patient continues to try to lose weight where he can.      Past Medical History:  Diagnosis Date   Allergy    Arthritis    Diverticulosis    Gout    Hyperlipidemia    Hypertension    Iron deficiency anemia 09/20/2018   OSA  (obstructive sleep apnea) 11/13/2013   Sickle cell trait ([place])    Sleep apnea    Past Surgical History:  Procedure Laterality Date   COLONOSCOPY     POLYPECTOMY     ROTATOR CUFF REPAIR     left   THROAT SURGERY     polyps removed from vocal cord   Social History   Socioeconomic History   Marital status: Married    Spouse name: Not on file   Number of children: 1   Years of education: Not on file   Highest education level: Not on file  Occupational History   Occupation: retired  Tobacco Use   Smoking status: Former    Packs/day: 2.00    Years: 5.00    Pack years: 10.00    Types: Cigarettes    Quit date: 05/23/1977    Years since quitting: 43.4   Smokeless tobacco: Never  Vaping Use   Vaping Use: Never used  Substance and Sexual Activity   Alcohol use: [name]    Comment: QUIT 2002   Drug use: [name]    Comment: Completed program in april 2000   Sexual activity: Yes  Other Topics Concern   Not on file  Social History Narrative   Not on file   Social Determinants of Health   Financial Resource Strain: Low Risk    Difficulty of Paying Living Expenses: Not hard at all  Food Insecurity: Not on file  Transportation Needs: [name] Transportation Needs   Lack of Transportation (Medical): [name]  Lack of Transportation (Non-Medical): [name]  Physical Activity: Not on file  Stress: Not on file  Social Connections: Not on file   [name] Known Allergies Family History  Problem Relation Age of Onset   Colon cancer Neg Hx    Esophageal cancer Neg Hx    Rectal cancer Neg Hx    Stomach cancer Neg Hx      Current Outpatient Medications (Cardiovascular):    fenofibrate 54 MG tablet, Take 1 tablet (54 mg total) by mouth daily.   irbesartan (AVAPRO) 300 MG tablet, TAKE 1 TABLET BY MOUTH  DAILY   rosuvastatin (CRESTOR) 20 MG tablet, Take 1 tablet (20 mg total) by mouth daily.   sildenafil (VIAGRA) 100 MG tablet, Take 1 tablet (100 mg total) by mouth as needed for erectile dysfunction. Yearly  physical w/labs due in February must see MD for refills   verapamil (CALAN-SR) 240 MG CR tablet, TAKE 1 TABLET BY MOUTH AT  BEDTIME  Current Outpatient Medications (Respiratory):    fluticasone (FLONASE) 50 MCG/ACT nasal spray, Place 2 sprays into both nostrils daily.   loratadine (CLARITIN) 10 MG tablet, Take 10 mg by mouth daily.  Current Outpatient Medications (Analgesics):    acetaminophen (TYLENOL) 500 MG tablet, Take 1,000 mg by mouth every 6 (six) hours as needed.   allopurinol (ZYLOPRIM) 300 MG tablet, TAKE 1 TABLET BY MOUTH  DAILY   aspirin 81 MG tablet, Take 81 mg by mouth daily.   Current Outpatient Medications (Other):    Blood Pressure Monitoring (B-D ASSURE BPM/AUTO ARM CUFF) MISC, 1 Units by Does not apply route daily. Arm circumference: 40cm   diclofenac Sodium (VOLTAREN) 1 % GEL, Apply 2 g topically 4 (four) times daily as needed.   pantoprazole (PROTONIX) 40 MG tablet, TAKE 1 TABLET BY MOUTH  DAILY   tamsulosin (FLOMAX) 0.4 MG CAPS capsule, TAKE 1 CAPSULE BY MOUTH  DAILY    Review of Systems:  [name] headache, visual changes, nausea, vomiting, diarrhea, constipation, dizziness, abdominal pain, skin rash, fevers, chills, night sweats, weight loss, swollen lymph nodes, body aches, joint swelling, chest pain, shortness of breath, mood changes.   Objective  Blood pressure 138/82, pulse 72, weight 260 lb (117.9 kg), SpO2 96 %.   General: [name] apparent distress alert and oriented x3 mood and affect normal, dressed appropriately.  Obese HEENT: Pupils equal, extraocular movements intact  Respiratory: Patient's speak in full sentences and does not appear short of breath  Cardiovascular: [name] lower extremity edema, non tender, [name] erythema  Gait normal with good balance and coordination.  MSK: Patient has had multiple knee arthritic changes bilaterally.  Trace effusion noted left greater than right.  Noted.     Impression and Recommendations:     The above documentation has been  reviewed and is accurate and complete Calvin Pulley, DO

## 2020-11-09 NOTE — Patient Instructions (Signed)
Aggie Pride! Keep doing what we are doing Goal for another 25# weight loss Follow up in 3 months

## 2020-11-12 ENCOUNTER — Inpatient Hospital Stay: Payer: Medicare Other | Attending: Hematology and Oncology | Admitting: Hematology and Oncology

## 2020-11-12 ENCOUNTER — Other Ambulatory Visit: Payer: Self-pay

## 2020-11-12 VITALS — BP 138/77 | HR 67 | Temp 96.5°F | Resp 24 | Wt 260.4 lb

## 2020-11-12 DIAGNOSIS — I499 Cardiac arrhythmia, unspecified: Secondary | ICD-10-CM | POA: Diagnosis not present

## 2020-11-12 DIAGNOSIS — D472 Monoclonal gammopathy: Secondary | ICD-10-CM | POA: Insufficient documentation

## 2020-11-12 DIAGNOSIS — D649 Anemia, unspecified: Secondary | ICD-10-CM | POA: Insufficient documentation

## 2020-11-12 DIAGNOSIS — D573 Sickle-cell trait: Secondary | ICD-10-CM | POA: Diagnosis not present

## 2020-11-12 DIAGNOSIS — D72819 Decreased white blood cell count, unspecified: Secondary | ICD-10-CM | POA: Insufficient documentation

## 2020-11-12 DIAGNOSIS — D696 Thrombocytopenia, unspecified: Secondary | ICD-10-CM | POA: Diagnosis not present

## 2020-11-12 NOTE — Progress Notes (Signed)
[facility] [phone]   [phone]  PROGRESS NOTE  Patient Care Team: Nche, Bonna Gains, NP as PCP - General (Internal Medicine) Marcine Matar, MD as Consulting Physician (Urology) Beverely Low, MD as Consulting Physician (Orthopedic Surgery) Gaspar Cola, [facility] as Pharmacist (Pharmacist)  Hematological/Oncological History  # Normocytic Anemia 1) 05/29/2018: HB electrophoresis confirms sickle cell trait. Ferritin 18. Hgb 13.1. MMA 107 2) 01/22/2019: iron 112, TIBC 313, Sat 36%, ferritin 31. Hgb 12.6 3) 01/27/2019: establish care with Dr. Leonides Schanz. WBC 2.6, Hgb 12.6, Plt 121, MCV 91.4 4) 10/20/2019: WBC 3.0, Hgb 12.9, Plt 115, MCV 91   #IgG Kappa MGUS 1) 09/18/18:  M protein 0.2, kappa 38, lambda 17.6, ratio 2.2 2) 01/27/2019: establish care with Dr. Leonides Schanz  3) 10/20/2019: M protein 0.1, Kappa 48.3, Lambda 18, Ratio 2.68 4) 11/05/2020: M protein 0.3, Kappa 56, Lambda 19.2, Ratio 2.92  #Leukopenia 1) Chronically low, with WBC 3.5 dating back to 03/24/2009 2) 2015-2020: stable at baseline WBC 3.5-5.0 3) 01/27/2019: establish care with Dr. Leonides Schanz.  WBC 2.6, Hgb 12.6, Plt 121, MCV 91.4 4) 10/20/2019: WBC 3.0, Hgb 12.9, Plt 115, MCV 91  Interval History:  Calvin [name]. 70 y.o. male with medical history significant for MGUS presents for a follow up visit. He was last seen on 04/26/2020. In the interim since our last visit he has had no ED visits or hospitalizations.   On exam today Calvin [name] reports that he feels well overall.  He notes that his energy levels are good and he is doing his best to lose weight.  He is trying to lose 10 pounds at a time.  He notes that he stopped eating sweets he is drinking plenty of water.  He reports he does have some episodes of nocturia for which she has been recommended that he water he is eating plenty of chicken and [place] but does not eat as much red meat he otherwise denies any fevers, chills, sweats,  nausea, vomiting or diarrhea.  Full 10 point ROS is listed below.    MEDICAL HISTORY:  Past Medical History:  Diagnosis Date   Allergy    Arthritis    Diverticulosis    Gout    Hyperlipidemia    Hypertension    Iron deficiency anemia 09/20/2018   OSA (obstructive sleep apnea) 11/13/2013   Sickle cell trait ([facility])    Sleep apnea     SURGICAL HISTORY: Past Surgical History:  Procedure Laterality Date   COLONOSCOPY     POLYPECTOMY     ROTATOR CUFF REPAIR     left   THROAT SURGERY     polyps removed from vocal cord    SOCIAL HISTORY: Social History   Socioeconomic History   Marital status: Married    Spouse name: Not on file   Number of children: 1   Years of education: Not on file   Highest education level: Not on file  Occupational History   Occupation: retired  Tobacco Use   Smoking status: Former    Packs/day: 2.00    Years: 5.00    Pack years: 10.00    Types: Cigarettes    Quit date: 05/23/1977    Years since quitting: 43.5   Smokeless tobacco: Never  Vaping Use   Vaping Use: Never used  Substance and Sexual Activity   Alcohol use: No    Comment: QUIT 2002   Drug use: No    Comment: Completed program in april 2000  Sexual activity: Yes  Other Topics Concern   Not on file  Social History Narrative   Not on file   Social Determinants of Health   Financial Resource Strain: Low Risk    Difficulty of Paying Living Expenses: Not hard at all  Food Insecurity: Not on file  Transportation Needs: No Transportation Needs   Lack of Transportation (Medical): No   Lack of Transportation (Non-Medical): No  Physical Activity: Not on file  Stress: Not on file  Social Connections: Not on file  Intimate Partner Violence: Not on file    FAMILY HISTORY: Family History  Problem Relation Age of Onset   Colon cancer Neg Hx    Esophageal cancer Neg Hx    Rectal cancer Neg Hx    Stomach cancer Neg Hx     ALLERGIES:  has No Known Allergies.  MEDICATIONS:   Current Outpatient Medications  Medication Sig Dispense Refill   acetaminophen (TYLENOL) 500 MG tablet Take 1,000 mg by mouth every 6 (six) hours as needed.     allopurinol (ZYLOPRIM) 300 MG tablet TAKE 1 TABLET BY MOUTH  DAILY 90 tablet 3   aspirin 81 MG tablet Take 81 mg by mouth daily.     Blood Pressure Monitoring (B-D ASSURE BPM/AUTO ARM CUFF) MISC 1 Units by Does not apply route daily. Arm circumference: 40cm 1 each 0   diclofenac Sodium (VOLTAREN) 1 % GEL Apply 2 g topically 4 (four) times daily as needed.     fenofibrate 54 MG tablet Take 1 tablet (54 mg total) by mouth daily. 90 tablet 3   fluticasone (FLONASE) 50 MCG/ACT nasal spray Place 2 sprays into both nostrils daily.     irbesartan (AVAPRO) 300 MG tablet TAKE 1 TABLET BY MOUTH  DAILY 90 tablet 3   loratadine (CLARITIN) 10 MG tablet Take 10 mg by mouth daily.     pantoprazole (PROTONIX) 40 MG tablet TAKE 1 TABLET BY MOUTH  DAILY 90 tablet 1   rosuvastatin (CRESTOR) 20 MG tablet Take 1 tablet (20 mg total) by mouth daily. 90 tablet 3   sildenafil (VIAGRA) 100 MG tablet Take 1 tablet (100 mg total) by mouth as needed for erectile dysfunction. Yearly physical w/labs due in February must see MD for refills 10 tablet 0   tamsulosin (FLOMAX) 0.4 MG CAPS capsule TAKE 1 CAPSULE BY MOUTH  DAILY 90 capsule 3   verapamil (CALAN-SR) 240 MG CR tablet TAKE 1 TABLET BY MOUTH AT  BEDTIME 90 tablet 3   No current facility-administered medications for this visit.    REVIEW OF SYSTEMS:   Constitutional: ( - ) fevers, ( - )  chills , ( - ) night sweats Eyes: ( - ) blurriness of vision, ( - ) double vision, ( - ) watery eyes Ears, nose, mouth, throat, and face: ( - ) mucositis, ( - ) sore throat Respiratory: ( - ) cough, ( - ) dyspnea, ( - ) wheezes Cardiovascular: ( - ) palpitation, ( - ) chest discomfort, ( - ) lower extremity swelling Gastrointestinal:  ( - ) nausea, ( - ) heartburn, ( - ) change in bowel habits Skin: ( - ) abnormal skin  rashes Lymphatics: ( - ) new lymphadenopathy, ( - ) easy bruising Neurological: ( - ) numbness, ( - ) tingling, ( - ) new weaknesses Behavioral/Psych: ( - ) mood change, ( - ) new changes  All other systems were reviewed with the patient and are negative.  PHYSICAL EXAMINATION: ECOG PERFORMANCE  STATUS: 1 - Symptomatic but completely ambulatory  Vitals:   11/12/20 0834  BP: 138/77  Pulse: 67  Resp: (!) 24  Temp: (!) 96.5 F (35.8 C)  SpO2: 98%    Filed Weights   11/12/20 0834  Weight: 260 lb 6 oz (118.1 kg)     GENERAL: well appearing African American male in NAD  SKIN: skin color, texture, turgor are normal, no rashes or significant lesions EYES: conjunctiva are pink and non-injected, sclera clear LUNGS: clear to auscultation and percussion with normal breathing effort HEART: regular rate & rhythm and no murmurs and no lower extremity edema Musculoskeletal: no cyanosis of digits and no clubbing  PSYCH: alert & oriented x 3, fluent speech NEURO: no focal motor/sensory deficits  LABORATORY DATA:  I have reviewed the data as listed CBC Latest Ref Rng & Units 11/05/2020 04/19/2020 10/20/2019  WBC 4.0 - 10.5 K/uL 3.2(L) 2.9(L) 3.0(L)  Hemoglobin 13.0 - 17.0 g/dL 11.4(L) 12.1(L) 12.9(L)  Hematocrit 39.0 - 52.0 % 33.2(L) 35.9(L) 37.6(L)  Platelets 150 - 400 K/uL 116(L) 142(L) 115(L)   CMP Latest Ref Rng & Units 11/05/2020 04/19/2020 02/02/2020  Glucose 70 - 99 mg/dL 109(H) 100(H) 85  BUN 8 - 23 mg/dL $Remove'22 14 19  'zwNORcO$ Creatinine 0.61 - 1.24 mg/dL 1.60(H) 1.46(H) 1.43  Sodium 135 - 145 mmol/L 142 141 142  Potassium 3.5 - 5.1 mmol/L 3.9 3.9 4.0  Chloride 98 - 111 mmol/L 110 109 108  CO2 22 - 32 mmol/L $RemoveB'23 26 27  'ErMmgVvc$ Calcium 8.9 - 10.3 mg/dL 9.1 8.7(L) 9.1  Total Protein 6.5 - 8.1 g/dL 7.3 7.1 -  Total Bilirubin 0.3 - 1.2 mg/dL 0.5 0.5 -  Alkaline Phos 38 - 126 U/L 55 65 -  AST 15 - 41 U/L 18 21 -  ALT 0 - 44 U/L 15 12 -   Lab Results  Component Value Date   KPAFRELGTCHN 56.0 (H)  11/05/2020   KPAFRELGTCHN 45.1 (H) 04/19/2020   KPAFRELGTCHN 48.3 (H) 10/20/2019   LAMBDASER 19.2 11/05/2020   LAMBDASER 19.0 04/19/2020   LAMBDASER 18.0 10/20/2019   KAPLAMBRATIO 2.92 (H) 11/05/2020   KAPLAMBRATIO 2.37 (H) 04/19/2020   KAPLAMBRATIO 2.68 (H) 10/20/2019    Lab Results  Component Value Date   MPROTEIN 0.3 (H) 11/05/2020   MPROTEIN 0.2 (H) 04/19/2020   MPROTEIN 0.1 (H) 10/20/2019     RADIOGRAPHIC STUDIES: None to review  ASSESSMENT & PLAN Calvin [name]. 70 y.o. male with medical history significant for MGUS presents for a follow up visit.  Review of Calvin [name] labs appear stable.    Given the stability of his reported health and the stability of his labs I think we can safely follow-up the patient approximately 6 months time.  At this juncture there is no clear indication for a bone marrow biopsy though I have would have a low threshold to perform this if any worsening abnormalities were noted in his blood.  We will plan to see the patient back in approximately 6 months however if he were to have any new symptoms in the interim we recommend that he call our clinic and we can always see him sooner.  #Normocytic Anemia, stable #Thrombocytopenia, stable --HB electrophoresis confirms sickle cell trait, though he has had normal baseline Hgb before in the past.  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 He may have developed low iron stores due to this generosity. He last donated in 2018. His  counts appear stable at this time. Iron stores appear replete as of 04/30/2019.  --etiology of thrombocytopenia is unclear. Prior abdominal imaging shows no evidence of cirrhosis, Hepatitis serologies were negative. Continue to monitor  --RTC in 6 months for further review.   #IgG Kappa Monoclonal Gammopathy of Undetermined Significance, stable  --last SPEP/SFLC showed stable labs --will repeat SPEP and SFLC 1 week prior to his  next visit.   --patient will require a bone survey at least yearly. Bone met survey  --continue to monitor at least q 63months.   #Leukopenia, stable --low WBC dating back to 2011, stably low around 3.5-.5.0.  --WBC 3.2 on 11/05/2020.  --05/29/2018: negative for HIV, Hep C, but had Hep B core antibody, no surface antigen or surface antibody.  --continue to monitor   #Thrombocytopenia --chronic fluctuating thrombocytopenia. There does appear to be a component of clumping --Plt 116 on 11/05/2020, ranges from 100-150 --CT scan of abdomen in March 2020 showed no evidence of cirrhosis. --continue to monitor   #Suspected Arrhythmia --on ascultation of heart I thought I detected a slight arrhythmia. --ordered EKG to confirm. No abnormalities on EKG --no issue, continued to monitor  Orders Placed This Encounter  Procedures   DG Bone Survey Met    Standing Status:   Future    Standing Expiration Date:   11/12/2021    Order Specific Question:   Reason for Exam (SYMPTOM  OR DIAGNOSIS REQUIRED)    Answer:   history of MGUS, assess for lytic lesions    Order Specific Question:   Preferred imaging location?    Answer:   [facility]   EKG 12-Lead    Ordered by an unspecified provider    EKG 12-Lead    Ordered by an unspecified provider      All questions were answered. The patient knows to call the clinic with any problems, questions or concerns.  A total of more than 30 minutes were spent face-to-face with the patient during this encounter and over half of that time was spent on counseling and coordination of care as outlined above.   Ledell Peoples, MD Department of Hematology/Oncology [place] [facility] Phone: [phone] Pager: [phone] Email: [email or web address]  11/12/2020 9:32 AM

## 2020-11-17 ENCOUNTER — Encounter: Payer: Self-pay | Admitting: Nurse Practitioner

## 2020-11-20 ENCOUNTER — Telehealth: Payer: Self-pay

## 2020-11-20 NOTE — Telephone Encounter (Signed)
Unable to call pt, unable to leave voicemail.

## 2020-11-20 NOTE — Telephone Encounter (Signed)
Unable to reach pt unable to leave voicemail.

## 2020-11-22 ENCOUNTER — Ambulatory Visit (HOSPITAL_COMMUNITY)
Admission: RE | Admit: 2020-11-22 | Discharge: 2020-11-22 | Disposition: A | Discharge status: Home / Self Care | Payer: Medicare Other | Source: Ambulatory Visit | Attending: Hematology and Oncology | Admitting: Hematology and Oncology

## 2020-11-22 ENCOUNTER — Other Ambulatory Visit: Payer: Self-pay

## 2020-11-22 DIAGNOSIS — D472 Monoclonal gammopathy: Secondary | ICD-10-CM | POA: Insufficient documentation

## 2020-11-25 DIAGNOSIS — G4733 Obstructive sleep apnea (adult) (pediatric): Secondary | ICD-10-CM | POA: Diagnosis not present

## 2020-11-26 ENCOUNTER — Telehealth: Payer: Self-pay | Admitting: *Deleted

## 2020-11-26 NOTE — Telephone Encounter (Signed)
-----   Message from Orson Slick, MD sent at 11/25/2020  5:14 PM EDT ----- Please let Mr. Kollmann know that his bone survey showed no abnormalities. We will see him back in 6 months with repeat bone survey in 1 year.   ----- Message ----- From: Interface, Rad Results In Sent: 11/23/2020   2:37 PM EDT To: Orson Slick, MD

## 2020-11-26 NOTE — Telephone Encounter (Signed)
TCT patient regarding recent bone survey. No answer but able to leave vm message stating his bone scan was normal and that we will see him back in 6 months. Advised to call back [phone] with any questions or concerns.

## 2020-11-30 DIAGNOSIS — G4733 Obstructive sleep apnea (adult) (pediatric): Secondary | ICD-10-CM | POA: Diagnosis not present

## 2020-12-10 ENCOUNTER — Telehealth: Payer: Self-pay

## 2020-12-10 NOTE — Progress Notes (Signed)
Per clinical pharmacist and [facility], inform patient we will need to move his office follow up  appointment with the clinical pharmacist to 12/13/2020 at 9:00 am.  Patient Verbalized understanding.  [place] Pharmacist Assistant [phone]

## 2020-12-13 ENCOUNTER — Ambulatory Visit (INDEPENDENT_AMBULATORY_CARE_PROVIDER_SITE_OTHER): Payer: Medicare Other

## 2020-12-13 ENCOUNTER — Other Ambulatory Visit: Payer: Self-pay

## 2020-12-13 DIAGNOSIS — E782 Mixed hyperlipidemia: Secondary | ICD-10-CM

## 2020-12-13 DIAGNOSIS — M1710 Unilateral primary osteoarthritis, unspecified knee: Secondary | ICD-10-CM

## 2020-12-13 DIAGNOSIS — N401 Enlarged prostate with lower urinary tract symptoms: Secondary | ICD-10-CM

## 2020-12-13 DIAGNOSIS — I10 Essential (primary) hypertension: Secondary | ICD-10-CM

## 2020-12-13 NOTE — Patient Instructions (Addendum)
Visit Information It was great speaking with you today!  Please let me know if you have any questions about our visit.   Goals Addressed             This Visit's Progress    Manage Pain-Osteoarthritis       Timeframe:  Long-Range Goal Priority:  High Start Date: 12/13/2020                             Expected End Date: 12/13/2021                      Follow Up within 90 days   - plan exercise or activity when pain is best controlled - stay active - track times pain is worst and when it is best - use ice or heat for pain relief    Why is this important?   Day-to-day life can be hard when you have joint pain.  Pain medicine is just one piece of the treatment puzzle. There are many things you can do to manage pain and stay strong.   Lifestyle changes like stopping smoking and eating foods with Vitamin D and calcium keeps your bones and muscles healthy. Your joints are better when supported by strong muscles.  You can try these action steps to help you manage your pain.     Notes:         Patient Care Plan: General Pharmacy (Adult)     Problem Identified: Hypertension, Hyperlipidemia, GERD, BPH and Gout   Priority: High     Long-Range Goal: Patient-Specific Goal   Start Date: 06/14/2020  Expected End Date: 12/13/2021  This Visit's Progress: On track  Recent Progress: On track  Priority: High  Note:   Current Barriers:  Unable to achieve control of cholesterol   Pharmacist Clinical Goal(s):  Patient will achieve control of cholesterol as evidenced by LDL less than 70 through collaboration with PharmD and provider.   Interventions: 1:1 collaboration with Nche, Charlene Brooke, NP regarding development and update of comprehensive plan of care as evidenced by provider attestation and co-signature Inter-disciplinary care team collaboration (see longitudinal plan of care) Comprehensive medication review performed; medication list updated in electronic medical  record  Hypertension (BP goal <140/90) -Controlled -Current treatment: Irbesartan 300 mg daily  Verapamil CR 240 mg at bedtime  -Medications previously tried: Amlodipine, HCTZ, valsartan   -Current home readings: Stopped monitoring, home monitor was inacurrate -Current meal patterns:  breakfast: Orange + Banana   lunch: Oatmeal + Berries + flaxseed + protein mix  dinner: Meat + two serviings of vegetables snacks: Grapes drinks: Mainly water  -Current exercise habits: going to gym 2-3 times weekly -Denies hypotensive/hypertensive symptoms -Recommended to continue current medication  Hyperlipidemia: (LDL goal < 70) -Uncontrolled -Current treatment: Fenofibrate 54 mg daily  Rosuvastatin 20 mg daily  -Medications previously tried: NA  -Educated on Importance of limiting foods high in cholesterol;  -Tolerating rosuvastatin well. May be able to discontinue fenofibrate in the future if cholesterol panel improves.   Gout (Goal: Prevent flares) -Controlled -Current treatment  Allopurinol 300 mg daily  -Medications previously tried: Colchicine, Indomethacin,  -Last flare: Patient felt he may have been having the start of a flare in his knee, but treated with acetaminophen + Voltaren gel and the inflammation improved. Counseled patient on low-purine diet plan, avoiding alcohol use   -Recommended to continue current medication  BPH (Goal: Maintain stable urinary  flow) -Controlled -Managed Dr. Diona Fanti  -Current treatment  Finasteride 5 mg daily Tamsulosin 0.4 mg daily  -Medications previously tried: NA  -Recommended to continue current medication  GERD (Goal: Prevent symptoms of heartburn or reflux) -Controlled -Current treatment  Pantoprazole 40 mg daily  -Medications previously tried: NA  -Recommended to continue current medication  Patient Goals/Self-Care Activities Patient will:  - check blood pressure every 1-2 weeks, document, and provide at future  appointments  Follow Up Plan: Face to Face appointment with care management team member scheduled for: 06/16/2021 at 11:00 AM      Patient agreed to services and verbal consent obtained.   Patient verbalizes understanding of instructions provided today and agrees to view in [place].   Junius Argyle, PharmD, [place], CPP Clinical Pharmacist [facility]  [phone]

## 2020-12-13 NOTE — Progress Notes (Signed)
Chronic Care Management Pharmacy Note  12/13/2020 Name:  Calvin [name]. MRN:  [id number] DOB:  Sep 29, 1950  Summary: Patient presents for CCM follow-up. He does not monitor his blood pressure at home, but otherwise reports he is feeling fine. He continues to manage his arthritis. He is due for an AWV.   Recommendations/Changes made from today's visit: Continue current medications   Plan: CPP follow-up 6 months  AWV scheduled for 01/25/21   Subjective: Calvin [name]. is an 70 y.o. year old male who is a primary patient of Nche, Charlene Brooke, NP.  The CCM team was consulted for assistance with disease management and care coordination needs.    Engaged with patient by telephone for follow up visit in response to provider referral for pharmacy case management and/or care coordination services.   Consent to Services:  The patient was given information about Chronic Care Management services, agreed to services, and gave verbal consent prior to initiation of services.  Please see initial visit note for detailed documentation.   Patient Care Team: Nche, Charlene Brooke, NP as PCP - General (Internal Medicine) Franchot Gallo, MD as Consulting Physician (Urology) Netta Cedars, MD as Consulting Physician (Orthopedic Surgery) Germaine Pomfret, [facility] as Pharmacist (Pharmacist)  Recent office visits: 08/19/20: Patient presented to Wilfred Lacy, NP for follow-up. Finasteride, pravastatin stopped.   Recent consult visits: 03/23/20: Patient presented to Rexene Edison, NP (Pulmonology) for sleep apnea follow-up. New CPAP machine ordered.  04/27/20: Patient presented to Dr. Lorenso Courier (Oncology) for follow-up.   Hospital visits: None in previous 6 months  Objective:  Lab Results  Component Value Date   CREATININE 1.60 (H) 11/05/2020   BUN 22 11/05/2020   GFR 49.31 (L) 02/02/2020   GFRNONAA 45 (L) 11/05/2020   GFRAA 52 (L) 10/20/2019   NA 142 11/05/2020   K 3.9 11/05/2020    CALCIUM 9.1 11/05/2020   CO2 23 11/05/2020   GLUCOSE 109 (H) 11/05/2020    Lab Results  Component Value Date/Time   HGBA1C 5.1 04/23/2019 10:01 AM   HGBA1C 5.5 11/26/2018 10:36 AM   GFR 49.31 (L) 02/02/2020 10:55 AM   GFR 57.19 (L) 04/23/2019 10:01 AM   MICROALBUR 5.9 01/22/2019 02:21 PM    Last diabetic Eye exam: No results found for: HMDIABEYEEXA  Last diabetic Foot exam: No results found for: HMDIABFOOTEX   Lab Results  Component Value Date   CHOL 108 06/16/2020   HDL 22.80 (L) 06/16/2020   LDLCALC 74 06/16/2020   LDLDIRECT 61.0 11/26/2018   TRIG 54.0 06/16/2020   CHOLHDL 5 06/16/2020    Hepatic Function Latest Ref Rng & Units 11/05/2020 04/19/2020 10/20/2019  Total Protein 6.5 - 8.1 g/dL 7.3 7.1 7.4  Albumin 3.5 - 5.0 g/dL 4.2 4.1 4.1  AST 15 - 41 U/L $Remo'18 21 21  'NnSyD$ ALT 0 - 44 U/L $Remo'15 12 15  'UyHNC$ Alk Phosphatase 38 - 126 U/L 55 65 68  Total Bilirubin 0.3 - 1.2 mg/dL 0.5 0.5 0.6  Bilirubin, Direct 0.0 - 0.3 mg/dL - - -    Lab Results  Component Value Date/Time   TSH 1.338 01/27/2019 12:43 PM   TSH 0.75 01/22/2019 02:21 PM   TSH 1.14 05/09/2018 10:09 AM    CBC Latest Ref Rng & Units 11/05/2020 04/19/2020 10/20/2019  WBC 4.0 - 10.5 K/uL 3.2(L) 2.9(L) 3.0(L)  Hemoglobin 13.0 - 17.0 g/dL 11.4(L) 12.1(L) 12.9(L)  Hematocrit 39.0 - 52.0 % 33.2(L) 35.9(L) 37.6(L)  Platelets 150 - 400 K/uL 116(L) 142(L)  115(L)    No results found for: VD25OH  Clinical ASCVD: Yes  The ASCVD Risk score (Arnett DK, et al., 2019) failed to calculate for the following reasons:   The valid total cholesterol range is 130 to 320 mg/dL    Depression screen [facility] 2/9 09/24/2019 01/22/2019 09/10/2018  Decreased Interest 0 0 0  Down, Depressed, Hopeless 0 0 0  PHQ - 2 Score 0 0 0  Some recent data might be hidden    Social History   Tobacco Use  Smoking Status Former   Packs/day: 2.00   Years: 5.00   Pack years: 10.00   Types: Cigarettes   Quit date: 05/23/1977   Years since quitting: 43.5  Smokeless  Tobacco Never   BP Readings from Last 3 Encounters:  11/12/20 138/77  11/09/20 138/82  08/24/20 130/74   Pulse Readings from Last 3 Encounters:  11/12/20 67  11/09/20 72  08/24/20 71   Wt Readings from Last 3 Encounters:  11/12/20 260 lb 6 oz (118.1 kg)  11/09/20 260 lb (117.9 kg)  08/24/20 260 lb (117.9 kg)   BMI Readings from Last 3 Encounters:  11/12/20 40.18 kg/m  11/09/20 40.12 kg/m  08/24/20 40.12 kg/m    Assessment/Interventions: Review of patient past medical history, allergies, medications, health status, including review of consultants reports, laboratory and other test data, was performed as part of comprehensive evaluation and provision of chronic care management services.   SDOH:  (Social Determinants of Health) assessments and interventions performed: Yes SDOH Interventions    Flowsheet Row Most Recent Value  SDOH Interventions   Financial Strain Interventions Intervention Not Indicated       SDOH Screenings   Alcohol Screen: Not on file  Depression (PHQ2-9): Not on file  Financial Resource Strain: Low Risk    Difficulty of Paying Living Expenses: Not hard at all  Food Insecurity: Not on file  Housing: Not on file  Physical Activity: Not on file  Social Connections: Not on file  Stress: Not on file  Tobacco Use: Medium Risk   Smoking Tobacco Use: Former   Smokeless Tobacco Use: Never  Transportation Needs: No Transportation Needs   Lack of Transportation (Medical): No   Lack of Transportation (Non-Medical): No    CCM Care Plan  No Known Allergies  Medications Reviewed Today     Reviewed by Germaine Pomfret, [facility] (Pharmacist) on 12/13/20 at [place] List Status: <None>   Medication Order Taking? Sig Documenting Provider Last Dose Status Informant  acetaminophen (TYLENOL) 500 MG tablet [id number] Yes Take 1,000 mg by mouth every 6 (six) hours as needed. [provider] Taking Active   allopurinol (ZYLOPRIM) 300 MG tablet  [id number] Yes TAKE 1 TABLET BY MOUTH  DAILY Nche, Charlene Brooke, NP Taking Active   aspirin 81 MG tablet [id number] Yes Take 81 mg by mouth daily. [provider] Taking Active   diclofenac Sodium (VOLTAREN) 1 % GEL [id number] Yes Apply 2 g topically 4 (four) times daily as needed. [provider] Taking Active   fenofibrate 54 MG tablet [id number] Yes Take 1 tablet (54 mg total) by mouth daily. Nche, Charlene Brooke, NP Taking Active   finasteride (PROSCAR) 5 MG tablet [id number] Yes Take 5 mg by mouth daily. [provider] Taking Active   fluticasone (FLONASE) 50 MCG/ACT nasal spray [id number]  Place 2 sprays into both nostrils daily. [provider]  Active            Med Note Michaelle Birks,  Rafaela Dinius A   Mon Dec 13, 2020  9:13 AM) Takes in the spring when in [place]  irbesartan (AVAPRO) 300 MG tablet [id number] Yes TAKE 1 TABLET BY MOUTH  DAILY Nche, Charlene Brooke, NP Taking Active   loratadine (CLARITIN) 10 MG tablet [id number] Yes Take 10 mg by mouth daily. [provider] Taking Active            Med Note Raeford Razor Dec 13, 2020  9:13 AM) Takes in the spring when in [place]  pantoprazole (PROTONIX) 40 MG tablet [id number] Yes TAKE 1 TABLET BY MOUTH  DAILY Irene Shipper, MD Taking Active   rosuvastatin (CRESTOR) 20 MG tablet [id number] Yes Take 1 tablet (20 mg total) by mouth daily. Nche, Charlene Brooke, NP Taking Active   sildenafil (VIAGRA) 100 MG tablet [id number] No Take 1 tablet (100 mg total) by mouth as needed for erectile dysfunction. Yearly physical w/labs due in February must see MD for refills  Patient not taking: Reported on 12/13/2020   Biagio Borg, MD Not Taking Active   tamsulosin [facility]) 0.4 MG CAPS capsule [id number] Yes TAKE 1 CAPSULE BY MOUTH  DAILY Nche, Charlene Brooke, NP Taking Active   verapamil (CALAN-SR) 240 MG CR tablet [id number] Yes TAKE 1 TABLET BY MOUTH AT  BEDTIME Nche, Charlene Brooke, NP Taking Active              Patient Active Problem List   Diagnosis Date Noted   Obesity, morbid ([place]) 02/02/2020   Monoclonal gammopathies 01/23/2019   Iron deficiency anemia 09/20/2018   Fever 07/03/2018   Thrombocytopenia ([place]) 05/09/2018   Degenerative arthritis of knee, bilateral 12/17/2017   Degenerative TFCC tear, right 11/08/2017   Carpal tunnel syndrome on right 08/31/2017   Degenerative joint disease of knee, left 08/03/2017   Right wrist pain 07/17/2017   Rash 05/08/2017   Sinus bradycardia 05/04/2016   Hyperglycemia 05/04/2016   Low serum HDL 05/04/2016   Hypertriglyceridemia 05/04/2016   Left rotator cuff tear 11/23/2015   Memory dysfunction 04/23/2015   Bursitis of right shoulder 03/03/2015   Pain of right side of body 01/16/2015   Acute meniscal tear of left knee 12/10/2013   Localized osteoarthrosis, lower leg 12/10/2013   OSA (obstructive sleep apnea) 11/13/2013   Peripheral edema 08/27/2013   Diarrhea 08/27/2013   Increased prostate specific antigen (PSA) velocity 04/02/2013   Bilateral knee pain 11/13/2012   Left knee pain 11/13/2012   Chronic gout of knee due to drug 11/13/2012   Urinary frequency 07/20/2012   Urinary retention 07/20/2012   Hip pain, acute 07/07/2012   Preventative health care 02/28/2011   SKIN LESION 05/26/2009   LIBIDO, DECREASED 05/26/2009   BACK PAIN 04/01/2009   GOUT 10/30/2007   Mixed hyperlipidemia 09/04/2007   Nocturia 09/04/2007   FATIGUE 08/20/2007   ALLERGIC RHINITIS 05/01/2007   Venous insufficiency of both lower extremities 03/09/2007   DIVERTICULOSIS, COLON 03/09/2007   COLONIC POLYPS, HX OF 03/09/2007   Essential hypertension 10/31/2006    Immunization History  Administered Date(s) Administered   Fluad Quad(high Dose 65+) 11/26/2018   H1N1 03/30/2008   Influenza Split 01/18/2012, 11/11/2013   Influenza Whole 01/14/2007, 01/11/2009   Influenza, High Dose Seasonal PF 01/03/2016   Influenza-Unspecified 02/02/2015, 01/04/2017,  01/02/2018, 12/08/2019   PFIZER(Purple Top)SARS-COV-2 Vaccination 04/19/2019, 05/12/2019, 12/08/2019, 06/24/2020, 12/01/2020   Pneumococcal Conjugate-13 08/27/2013   Pneumococcal Polysaccharide-23 12/10/2013   Td 10/19/2007   Tdap 05/09/2018   Zoster Recombinat (Shingrix) 01/22/2019, 06/24/2019  Zoster, Live 04/01/2009    Conditions to be addressed/monitored:  Hypertension, Hyperlipidemia, GERD, BPH and Gout  Care Plan : General Pharmacy (Adult)  Updates made by Germaine Pomfret, RPH since 12/13/2020 12:00 AM     Problem: Hypertension, Hyperlipidemia, GERD, BPH and Gout   Priority: High     Long-Range Goal: Patient-Specific Goal   Start Date: 06/14/2020  Expected End Date: 12/13/2021  This Visit's Progress: On track  Recent Progress: On track  Priority: High  Note:   Current Barriers:  Unable to achieve control of cholesterol   Pharmacist Clinical Goal(s):  Patient will achieve control of cholesterol as evidenced by LDL less than 70 through collaboration with PharmD and provider.   Interventions: 1:1 collaboration with Nche, Charlene Brooke, NP regarding development and update of comprehensive plan of care as evidenced by provider attestation and co-signature Inter-disciplinary care team collaboration (see longitudinal plan of care) Comprehensive medication review performed; medication list updated in electronic medical record  Hypertension (BP goal <140/90) -Controlled -Current treatment: Irbesartan 300 mg daily  Verapamil CR 240 mg at bedtime  -Medications previously tried: Amlodipine, HCTZ, valsartan   -Current home readings: Stopped monitoring, home monitor was inacurrate -Current meal patterns:  breakfast: Orange + Banana   lunch: Oatmeal + Berries + flaxseed + protein mix  dinner: Meat + two serviings of vegetables snacks: Grapes drinks: Mainly water  -Current exercise habits: going to gym 2-3 times weekly -Denies hypotensive/hypertensive  symptoms -Recommended to continue current medication  Hyperlipidemia: (LDL goal < 70) -Uncontrolled -Current treatment: Fenofibrate 54 mg daily  Rosuvastatin 20 mg daily  -Medications previously tried: NA  -Educated on Importance of limiting foods high in cholesterol;  -Tolerating rosuvastatin well. May be able to discontinue fenofibrate in the future if cholesterol panel improves.   Gout (Goal: Prevent flares) -Controlled -Current treatment  Allopurinol 300 mg daily  -Medications previously tried: Colchicine, Indomethacin,  -Last flare: Patient felt he may have been having the start of a flare in his knee, but treated with acetaminophen + Voltaren gel and the inflammation improved. Counseled patient on low-purine diet plan, avoiding alcohol use   -Recommended to continue current medication  BPH (Goal: Maintain stable urinary flow) -Controlled -Managed Dr. Diona Fanti  -Current treatment  Finasteride 5 mg daily Tamsulosin 0.4 mg daily  -Medications previously tried: NA  -Recommended to continue current medication  GERD (Goal: Prevent symptoms of heartburn or reflux) -Controlled -Current treatment  Pantoprazole 40 mg daily  -Medications previously tried: NA  -Recommended to continue current medication  Patient Goals/Self-Care Activities Patient will:  - check blood pressure every 1-2 weeks, document, and provide at future appointments  Follow Up Plan: Face to Face appointment with care management team member scheduled for: 06/16/2021 at 11:00 AM       Medication Assistance: None required.  Patient affirms current coverage meets needs.  Patient's preferred pharmacy is:  [employer]  ([place] - [place] [place] [facility] [address] [place] [address] [place] [address] Phone: [phone] Fax: [place] [address], [place] - [place] AT [facility] [address] [place] [address] Phone:  [phone] Fax: [phone]  Uses pill box? Yes Pt endorses 100% compliance  We discussed: Current pharmacy is preferred with insurance plan and patient is satisfied with pharmacy services Patient decided to: Continue current medication management strategy  Care Plan and Follow Up Patient Decision:  Patient agrees to Care Plan and Follow-up.  Plan: Face  to Face appointment with care management team member scheduled for: 06/16/2021 at 11:00 AM  Junius Argyle, PharmD, CPP Clinical Pharmacist [facility]  [phone]

## 2020-12-14 ENCOUNTER — Ambulatory Visit: Payer: Medicare Other

## 2020-12-30 DIAGNOSIS — G4733 Obstructive sleep apnea (adult) (pediatric): Secondary | ICD-10-CM | POA: Diagnosis not present

## 2021-01-10 ENCOUNTER — Telehealth: Payer: Self-pay

## 2021-01-10 DIAGNOSIS — M1710 Unilateral primary osteoarthritis, unspecified knee: Secondary | ICD-10-CM

## 2021-01-10 DIAGNOSIS — E782 Mixed hyperlipidemia: Secondary | ICD-10-CM | POA: Diagnosis not present

## 2021-01-10 DIAGNOSIS — N401 Enlarged prostate with lower urinary tract symptoms: Secondary | ICD-10-CM

## 2021-01-10 DIAGNOSIS — R350 Frequency of micturition: Secondary | ICD-10-CM

## 2021-01-10 DIAGNOSIS — I10 Essential (primary) hypertension: Secondary | ICD-10-CM | POA: Diagnosis not present

## 2021-01-10 NOTE — Progress Notes (Signed)
    Chronic Care Management Pharmacy Assistant   Name: Calvin [name].  MRN: [id number] DOB: 1950/06/10  Reason for Encounter: Medication Review/Adherecne update for Irbesartan   Recent office visits:  No recent office visit  Recent consult visits:  No recent consult visit  Hospital visits:  None in previous 6 months  Medications: Outpatient Encounter Medications as of 01/10/2021  Medication Sig Note   acetaminophen (TYLENOL) 500 MG tablet Take 1,000 mg by mouth every 6 (six) hours as needed.    allopurinol (ZYLOPRIM) 300 MG tablet TAKE 1 TABLET BY MOUTH  DAILY    aspirin 81 MG tablet Take 81 mg by mouth daily.    diclofenac Sodium (VOLTAREN) 1 % GEL Apply 2 g topically 4 (four) times daily as needed.    fenofibrate 54 MG tablet Take 1 tablet (54 mg total) by mouth daily.    finasteride (PROSCAR) 5 MG tablet Take 5 mg by mouth daily.    fluticasone (FLONASE) 50 MCG/ACT nasal spray Place 2 sprays into both nostrils daily. 12/13/2020: Takes in the spring when in [place]   irbesartan (AVAPRO) 300 MG tablet TAKE 1 TABLET BY MOUTH  DAILY    loratadine (CLARITIN) 10 MG tablet Take 10 mg by mouth daily. 12/13/2020: Takes in the spring when in [place]   pantoprazole (PROTONIX) 40 MG tablet TAKE 1 TABLET BY MOUTH  DAILY    rosuvastatin (CRESTOR) 20 MG tablet Take 1 tablet (20 mg total) by mouth daily.    sildenafil (VIAGRA) 100 MG tablet Take 1 tablet (100 mg total) by mouth as needed for erectile dysfunction. Yearly physical w/labs due in February must see MD for refills (Patient not taking: Reported on 12/13/2020)    tamsulosin (FLOMAX) 0.4 MG CAPS capsule TAKE 1 CAPSULE BY MOUTH  DAILY    verapamil (CALAN-SR) 240 MG CR tablet TAKE 1 TABLET BY MOUTH AT  BEDTIME    No facility-administered encounter medications on file as of 01/10/2021.    Care Gaps: Influenza Vaccine Star Rating Drugs: Rosuvastatin 20 mg last filled 08/12/2020 90 day supply at [facility]. Irbesartan 300 mg  last filled 09/17/2020 90 day supply.(No pharmacy noted)  Adherence issue: Irbesartan 300 mg last filled 09/17/2020 90 day supply.(No pharmacy noted) Rosuvastatin 20 mg  last filled 08/12/2020 90 day supply at [facility].  I reach out to [facility] to confirm last fill date and day supply for Irbesartan and Rosuvastatin.  Per [facility] patient last filled for  Irbesartan 300 mg on 12/25/2020 for 90 day supply, and Rosuvastatin 20 mg on 12/25/2020 for 90 day supply.Notified Clinical Pharmacist.  Bessie Minneiska Pharmacist Assistant [phone]

## 2021-01-19 ENCOUNTER — Other Ambulatory Visit: Payer: Self-pay | Admitting: Nurse Practitioner

## 2021-01-25 ENCOUNTER — Ambulatory Visit: Payer: Medicare Other

## 2021-01-30 DIAGNOSIS — G4733 Obstructive sleep apnea (adult) (pediatric): Secondary | ICD-10-CM | POA: Diagnosis not present

## 2021-02-09 ENCOUNTER — Ambulatory Visit: Payer: Medicare Other | Admitting: Family Medicine

## 2021-02-09 NOTE — Telephone Encounter (Signed)
Chart supports Rx 

## 2021-02-10 ENCOUNTER — Other Ambulatory Visit: Payer: Self-pay | Admitting: Nurse Practitioner

## 2021-02-15 ENCOUNTER — Ambulatory Visit (INDEPENDENT_AMBULATORY_CARE_PROVIDER_SITE_OTHER): Payer: Medicare Other

## 2021-02-15 ENCOUNTER — Other Ambulatory Visit: Payer: Self-pay

## 2021-02-15 VITALS — BP 138/78 | HR 57 | Temp 97.0°F | Ht 68.0 in | Wt 268.0 lb

## 2021-02-15 DIAGNOSIS — Z0000 Encounter for general adult medical examination without abnormal findings: Secondary | ICD-10-CM | POA: Diagnosis not present

## 2021-02-15 NOTE — Progress Notes (Signed)
Subjective:   Calvin [name]. is a 70 y.o. male who presents for an Subsequent Medicare Annual Wellness Visit.  Review of Systems           Objective:    There were no vitals filed for this visit. There is no height or weight on file to calculate BMI.  Advanced Directives 09/24/2019 09/10/2018 05/29/2018 09/07/2017 09/06/2016 04/13/2016 04/03/2016  Does Patient Have a Medical Advance Directive? Yes Yes Yes Yes Yes Yes Yes  Type of Paramedic of [place];Living will [place];Living will [place];Living will Healthcare Power of [place];Living will [place];Living will [place];Living will  Copy of [place] in Chart? No - copy requested No - copy requested No - copy requested - No - copy requested - -    Current Medications (verified) Outpatient Encounter Medications as of 02/15/2021  Medication Sig   acetaminophen (TYLENOL) 500 MG tablet Take 1,000 mg by mouth every 6 (six) hours as needed.   allopurinol (ZYLOPRIM) 300 MG tablet TAKE 1 TABLET BY MOUTH  DAILY   aspirin 81 MG tablet Take 81 mg by mouth daily.   diclofenac Sodium (VOLTAREN) 1 % GEL Apply 2 g topically 4 (four) times daily as needed.   fenofibrate 54 MG tablet Take 1 tablet (54 mg total) by mouth daily.   finasteride (PROSCAR) 5 MG tablet Take 5 mg by mouth daily.   fluticasone (FLONASE) 50 MCG/ACT nasal spray Place 2 sprays into both nostrils daily.   irbesartan (AVAPRO) 300 MG tablet TAKE 1 TABLET BY MOUTH  DAILY   loratadine (CLARITIN) 10 MG tablet Take 10 mg by mouth daily.   pantoprazole (PROTONIX) 40 MG tablet TAKE 1 TABLET BY MOUTH  DAILY   rosuvastatin (CRESTOR) 20 MG tablet Take 1 tablet (20 mg total) by mouth daily.   sildenafil (VIAGRA) 100 MG tablet Take 1 tablet (100 mg total) by mouth as needed for erectile dysfunction. Yearly physical w/labs due in February  must see MD for refills (Patient not taking: Reported on 12/13/2020)   tamsulosin (FLOMAX) 0.4 MG CAPS capsule TAKE 1 CAPSULE BY MOUTH  DAILY   verapamil (CALAN-SR) 240 MG CR tablet TAKE 1 TABLET BY MOUTH AT  BEDTIME   No facility-administered encounter medications on file as of 02/15/2021.    Allergies (verified) Patient has no known allergies.   History: Past Medical History:  Diagnosis Date   Allergy    Arthritis    Diverticulosis    Gout    Hyperlipidemia    Hypertension    Iron deficiency anemia 09/20/2018   OSA (obstructive sleep apnea) 11/13/2013   Sickle cell trait ([place])    Sleep apnea    Past Surgical History:  Procedure Laterality Date   COLONOSCOPY     POLYPECTOMY     ROTATOR CUFF REPAIR     left   THROAT SURGERY     polyps removed from vocal cord   Family History  Problem Relation Age of Onset   Colon cancer Neg Hx    Esophageal cancer Neg Hx    Rectal cancer Neg Hx    Stomach cancer Neg Hx    Social History   Socioeconomic History   Marital status: Married    Spouse name: Not on file   Number of children: 1   Years of education: Not on file   Highest education level: Not on file  Occupational History   Occupation: retired  Tobacco Use   Smoking status: Former    Packs/day: 2.00    Years: 5.00    Pack years: 10.00    Types: Cigarettes    Quit date: 05/23/1977    Years since quitting: 43.7   Smokeless tobacco: Never  Vaping Use   Vaping Use: Never used  Substance and Sexual Activity   Alcohol use: No    Comment: QUIT 2002   Drug use: No    Comment: Completed program in april 2000   Sexual activity: Yes  Other Topics Concern   Not on file  Social History Narrative   Not on file   Social Determinants of Health   Financial Resource Strain: Low Risk    Difficulty of Paying Living Expenses: Not hard at all  Food Insecurity: Not on file  Transportation Needs: Not on file  Physical Activity: Not on file  Stress: Not on file  Social  Connections: Not on file    Tobacco Counseling Counseling given: Not Answered   Clinical Intake:                 Diabetic?no          Activities of Daily Living No flowsheet data found.  Patient Care Team: Nche, Charlene Brooke, NP as PCP - General (Internal Medicine) Franchot Gallo, MD as Consulting Physician (Urology) Netta Cedars, MD as Consulting Physician (Orthopedic Surgery) Germaine Pomfret, [facility] as Pharmacist (Pharmacist)  Indicate any recent Medical Services you may have received from other than [facility] providers in the past year (date may be approximate).     Assessment:   This is a routine wellness examination for Calvin [name].  Hearing/Vision screen No results found.  Dietary issues and exercise activities discussed:     Goals Addressed   None    Depression Screen PHQ 2/9 Scores 09/24/2019 01/22/2019 09/10/2018 05/09/2018 09/07/2017 05/08/2017 09/06/2016  PHQ - 2 Score 0 0 0 0 0 0 0    Fall Risk Fall Risk  02/02/2020 09/24/2019 01/22/2019 09/10/2018 09/07/2017  Falls in the past year? 0 0 0 0 No  Number falls in past yr: 0 0 - 0 -  Injury with Fall? 0 0 - - -  Comment - - - - -  Follow up - Falls prevention discussed - - -    FALL RISK PREVENTION PERTAINING TO THE HOME:  Any stairs in or around the home? Yes  If so, are there any without handrails? No  Home free of loose throw rugs in walkways, pet beds, electrical cords, etc? Yes  Adequate lighting in your home to reduce risk of falls? Yes   ASSISTIVE DEVICES UTILIZED TO PREVENT FALLS:  Life alert? No  Use of a cane, walker or w/c? No  Grab bars in the bathroom? Yes  Shower chair or bench in shower? Yes  Elevated toilet seat or a handicapped toilet? Yes   TIMED UP AND GO:  Was the test performed? Yes .  Length of time to ambulate 10 feet: 7 sec.   Gait steady and fast without use of assistive device  Cognitive Function: Normal cognitive status assessed by direct observation by this  Nurse Health Advisor. No abnormalities found.   MMSE - Mini Mental State Exam 09/07/2017  Orientation to time 5  Orientation to Place 5  Registration 3  Attention/ Calculation 5  Recall 3  Language- name 2 objects 2  Language- repeat 1  Language- follow 3 step command 3  Language- read & follow direction 1  Write a sentence 1  Copy design 1  Total score 30        Immunizations Immunization History  Administered Date(s) Administered   Fluad Quad(high Dose 65+) 11/26/2018   H1N1 03/30/2008   Influenza Split 01/18/2012, 11/11/2013   Influenza Whole 01/14/2007, 01/11/2009   Influenza, High Dose Seasonal PF 01/03/2016   Influenza-Unspecified 02/02/2015, 01/04/2017, 01/02/2018, 12/08/2019   PFIZER(Purple Top)SARS-COV-2 Vaccination 04/19/2019, 05/12/2019, 12/08/2019, 06/24/2020, 12/01/2020   Pneumococcal Conjugate-13 08/27/2013   Pneumococcal Polysaccharide-23 12/10/2013   Td 10/19/2007   Tdap 05/09/2018   Zoster Recombinat (Shingrix) 01/22/2019, 06/24/2019   Zoster, Live 04/01/2009    TDAP status: Up to date  Flu Vaccine status: Up to date  Pneumococcal vaccine status: Up to date  Covid-19 vaccine status: Completed vaccines  Qualifies for Shingles Vaccine? Yes   Zostavax completed Yes   Shingrix Completed?: Yes  Screening Tests Health Maintenance  Topic Date Due   INFLUENZA VACCINE  10/11/2020   COVID-19 Vaccine (6 - Booster for Pfizer series) 01/26/2021   TETANUS/TDAP  05/09/2028   COLONOSCOPY (Pts 45-61yrs Insurance coverage will need to be confirmed)  11/18/2028   Pneumonia Vaccine 24+ Years old  Completed   Hepatitis C Screening  Completed   Zoster Vaccines- Shingrix  Completed   HPV VACCINES  Aged Out    Health Maintenance  Health Maintenance Due  Topic Date Due   INFLUENZA VACCINE  10/11/2020   COVID-19 Vaccine (6 - Booster for Klemme series) 01/26/2021    Colorectal cancer screening: Type of screening: Colonoscopy. Completed 11/19/2018. Repeat  every 10 years  Lung Cancer Screening: (Low Dose CT Chest recommended if Age 55-80 years, 30 pack-year currently smoking OR have quit w/in 15years.) does not qualify.   Lung Cancer Screening Referral: n/a  Additional Screening:  Hepatitis C Screening: does not qualify; Completed 05/29/2018  Vision Screening: Recommended annual ophthalmology exams for early detection of glaucoma and other disorders of the eye. Is the patient up to date with their annual eye exam?  Yes  Who is the provider or what is the name of the office in which the patient attends annual eye exams? [facility]  If pt is not established with a provider, would they like to be referred to a provider to establish care? No .   Dental Screening: Recommended annual dental exams for proper oral hygiene  Community Resource Referral / Chronic Care Management: CRR required this visit?  No   CCM required this visit?  No      Plan:     I have personally reviewed and noted the following in the patient's chart:   Medical and social history Use of alcohol, tobacco or illicit drugs  Current medications and supplements including opioid prescriptions. Patient is not currently taking opioid prescriptions. Functional ability and status Nutritional status Physical activity Advanced directives List of other physicians Hospitalizations, surgeries, and ER visits in previous 12 months Vitals Screenings to include cognitive, depression, and falls Referrals and appointments  In addition, I have reviewed and discussed with patient certain preventive protocols, quality metrics, and best practice recommendations. A written personalized care plan for preventive services as well as general preventive health recommendations were provided to patient.     Randel Pigg, LPN   72/08/2033   Nurse Notes: none

## 2021-02-15 NOTE — Patient Instructions (Signed)
Calvin [name] , Thank you for taking time to come for your Medicare Wellness Visit. I appreciate your ongoing commitment to your health goals. Please review the following plan we discussed and let me know if I can assist you in the future.   Screening recommendations/referrals: Colonoscopy: 11/19/2018 Recommended yearly ophthalmology/optometry visit for glaucoma screening and checkup Recommended yearly dental visit for hygiene and checkup  Vaccinations: Influenza vaccine: completed  Pneumococcal vaccine: completed  Tdap vaccine: 05/09/2018 Shingles vaccine: completed     Advanced directives: will provide copies   Conditions/risks identified: none   Next appointment: 12/9/20222  0900  Wilfred Lacy , NP   Preventive Care 72 Years and Older, Male Preventive care refers to lifestyle choices and visits with your health care provider that can promote health and wellness. What does preventive care include? A yearly physical exam. This is also called an annual well check. Dental exams once or twice a year. Routine eye exams. Ask your health care provider how often you should have your eyes checked. Personal lifestyle choices, including: Daily care of your teeth and gums. Regular physical activity. Eating a healthy diet. Avoiding tobacco and drug use. Limiting alcohol use. Practicing safe sex. Taking low doses of aspirin every day. Taking vitamin and mineral supplements as recommended by your health care provider. What happens during an annual well check? The services and screenings done by your health care provider during your annual well check will depend on your age, overall health, lifestyle risk factors, and family history of disease. Counseling  Your health care provider may ask you questions about your: Alcohol use. Tobacco use. Drug use. Emotional well-being. Home and relationship well-being. Sexual activity. Eating habits. History of falls. Memory and ability to  understand (cognition). Work and work Statistician. Screening  You may have the following tests or measurements: Height, weight, and BMI. Blood pressure. Lipid and cholesterol levels. These may be checked every 5 years, or more frequently if you are over 8 years old. Skin check. Lung cancer screening. You may have this screening every year starting at age 63 if you have a 30-pack-year history of smoking and currently smoke or have quit within the past 15 years. Fecal occult blood test (FOBT) of the stool. You may have this test every year starting at age 25. Flexible sigmoidoscopy or colonoscopy. You may have a sigmoidoscopy every 5 years or a colonoscopy every 10 years starting at age 47. Prostate cancer screening. Recommendations will vary depending on your family history and other risks. Hepatitis C blood test. Hepatitis B blood test. Sexually transmitted disease (STD) testing. Diabetes screening. This is done by checking your blood sugar (glucose) after you have not eaten for a while (fasting). You may have this done every 1-3 years. Abdominal aortic aneurysm (AAA) screening. You may need this if you are a current or former smoker. Osteoporosis. You may be screened starting at age 66 if you are at high risk. Talk with your health care provider about your test results, treatment options, and if necessary, the need for more tests. Vaccines  Your health care provider may recommend certain vaccines, such as: Influenza vaccine. This is recommended every year. Tetanus, diphtheria, and acellular pertussis (Tdap, Td) vaccine. You may need a Td booster every 10 years. Zoster vaccine. You may need this after age 48. Pneumococcal 13-valent conjugate (PCV13) vaccine. One dose is recommended after age 68. Pneumococcal polysaccharide (PPSV23) vaccine. One dose is recommended after age 10. Talk to your health care provider about which screenings  and vaccines you need and how often you need them. This  information is not intended to replace advice given to you by your health care provider. Make sure you discuss any questions you have with your health care provider. Document Released: 03/26/2015 Document Revised: 11/17/2015 Document Reviewed: 12/29/2014 Elsevier Interactive Patient Education  2017 [place] Prevention in the Home Falls can cause injuries. They can happen to people of all ages. There are many things you can do to make your home safe and to help prevent falls. What can I do on the outside of my home? Regularly fix the edges of walkways and driveways and fix any cracks. Remove anything that might make you trip as you walk through a door, such as a raised step or threshold. Trim any bushes or trees on the path to your home. Use bright outdoor lighting. Clear any walking paths of anything that might make someone trip, such as rocks or tools. Regularly check to see if handrails are loose or broken. Make sure that both sides of any steps have handrails. Any raised decks and porches should have guardrails on the edges. Have any leaves, snow, or ice cleared regularly. Use sand or salt on walking paths during winter. Clean up any spills in your garage right away. This includes oil or grease spills. What can I do in the bathroom? Use night lights. Install grab bars by the toilet and in the tub and shower. Do not use towel bars as grab bars. Use non-skid mats or decals in the tub or shower. If you need to sit down in the shower, use a plastic, non-slip stool. Keep the floor dry. Clean up any water that spills on the floor as soon as it happens. Remove soap buildup in the tub or shower regularly. Attach bath mats securely with double-sided non-slip rug tape. Do not have throw rugs and other things on the floor that can make you trip. What can I do in the bedroom? Use night lights. Make sure that you have a light by your bed that is easy to reach. Do not use any sheets or  blankets that are too big for your bed. They should not hang down onto the floor. Have a firm chair that has side arms. You can use this for support while you get dressed. Do not have throw rugs and other things on the floor that can make you trip. What can I do in the kitchen? Clean up any spills right away. Avoid walking on wet floors. Keep items that you use a lot in easy-to-reach places. If you need to reach something above you, use a strong step stool that has a grab bar. Keep electrical cords out of the way. Do not use floor polish or wax that makes floors slippery. If you must use wax, use non-skid floor wax. Do not have throw rugs and other things on the floor that can make you trip. What can I do with my stairs? Do not leave any items on the stairs. Make sure that there are handrails on both sides of the stairs and use them. Fix handrails that are broken or loose. Make sure that handrails are as long as the stairways. Check any carpeting to make sure that it is firmly attached to the stairs. Fix any carpet that is loose or worn. Avoid having throw rugs at the top or bottom of the stairs. If you do have throw rugs, attach them to the floor with carpet tape.  Make sure that you have a light switch at the top of the stairs and the bottom of the stairs. If you do not have them, ask someone to add them for you. What else can I do to help prevent falls? Wear shoes that: Do not have high heels. Have rubber bottoms. Are comfortable and fit you well. Are closed at the toe. Do not wear sandals. If you use a stepladder: Make sure that it is fully opened. Do not climb a closed stepladder. Make sure that both sides of the stepladder are locked into place. Ask someone to hold it for you, if possible. Clearly mark and make sure that you can see: Any grab bars or handrails. First and last steps. Where the edge of each step is. Use tools that help you move around (mobility aids) if they are  needed. These include: Canes. Walkers. Scooters. Crutches. Turn on the lights when you go into a dark area. Replace any light bulbs as soon as they burn out. Set up your furniture so you have a clear path. Avoid moving your furniture around. If any of your floors are uneven, fix them. If there are any pets around you, be aware of where they are. Review your medicines with your doctor. Some medicines can make you feel dizzy. This can increase your chance of falling. Ask your doctor what other things that you can do to help prevent falls. This information is not intended to replace advice given to you by your health care provider. Make sure you discuss any questions you have with your health care provider. Document Released: 12/24/2008 Document Revised: 08/05/2015 Document Reviewed: 04/03/2014 Elsevier Interactive Patient Education  2017 [employer].

## 2021-02-16 ENCOUNTER — Other Ambulatory Visit: Payer: Self-pay | Admitting: Internal Medicine

## 2021-02-18 ENCOUNTER — Ambulatory Visit: Payer: Medicare Other

## 2021-02-18 ENCOUNTER — Ambulatory Visit (INDEPENDENT_AMBULATORY_CARE_PROVIDER_SITE_OTHER): Payer: Medicare Other | Admitting: Nurse Practitioner

## 2021-02-18 ENCOUNTER — Other Ambulatory Visit: Payer: Self-pay

## 2021-02-18 VITALS — BP 134/70 | HR 68 | Temp 96.0°F | Wt 267.6 lb

## 2021-02-18 DIAGNOSIS — I10 Essential (primary) hypertension: Secondary | ICD-10-CM

## 2021-02-18 DIAGNOSIS — R739 Hyperglycemia, unspecified: Secondary | ICD-10-CM

## 2021-02-18 DIAGNOSIS — S6721XA Crushing injury of right hand, initial encounter: Secondary | ICD-10-CM | POA: Diagnosis not present

## 2021-02-18 DIAGNOSIS — E782 Mixed hyperlipidemia: Secondary | ICD-10-CM

## 2021-02-18 DIAGNOSIS — M1A0620 Idiopathic chronic gout, left knee, without tophus (tophi): Secondary | ICD-10-CM

## 2021-02-18 LAB — LIPID PANEL
Cholesterol: 118 mg/dL (ref 0–200)
HDL: 25.8 mg/dL — ABNORMAL LOW (ref >39.00)
LDL Cholesterol: 76 mg/dL (ref 0–99)
NonHDL: 91.9
Total CHOL/HDL Ratio: 5
Triglycerides: 80 mg/dL (ref 0.0–149.0)
VLDL: 16 mg/dL (ref 0.0–40.0)

## 2021-02-18 LAB — HEMOGLOBIN A1C: Hgb A1c MFr Bld: 5.9 % (ref 4.6–6.5)

## 2021-02-18 NOTE — Patient Instructions (Addendum)
Go to lab for blood draw You will be contacted to schedule appt with nutritionist  Diabetes Mellitus and Nutrition, Adult When you have diabetes, or diabetes mellitus, it is very important to have healthy eating habits because your blood sugar (glucose) levels are greatly affected by what you eat and drink. Eating healthy foods in the right amounts, at about the same times every day, can help you: Manage your blood glucose. Lower your risk of heart disease. Improve your blood pressure. Reach or maintain a healthy weight. What can affect my meal plan? Every person with diabetes is different, and each person has different needs for a meal plan. Your health care provider may recommend that you work with a dietitian to make a meal plan that is best for you. Your meal plan may vary depending on factors such as: The calories you need. The medicines you take. Your weight. Your blood glucose, blood pressure, and cholesterol levels. Your activity level. Other health conditions you have, such as heart or kidney disease. How do carbohydrates affect me? Carbohydrates, also called carbs, affect your blood glucose level more than any other type of food. Eating carbs raises the amount of glucose in your blood. It is important to know how many carbs you can safely have in each meal. This is different for every person. Your dietitian can help you calculate how many carbs you should have at each meal and for each snack. How does alcohol affect me? Alcohol can cause a decrease in blood glucose (hypoglycemia), especially if you use insulin or take certain diabetes medicines by mouth. Hypoglycemia can be a life-threatening condition. Symptoms of hypoglycemia, such as sleepiness, dizziness, and confusion, are similar to symptoms of having too much alcohol. Do not drink alcohol if: Your health care provider tells you not to drink. You are pregnant, may be pregnant, or are planning to become pregnant. If you drink  alcohol: Limit how much you have to: 0-1 drink a day for women. 0-2 drinks a day for men. Know how much alcohol is in your drink. In the [place] one drink equals one 12 oz bottle of beer (355 mL), one 5 oz glass of wine (148 mL), or one 1 oz glass of hard liquor (44 mL). Keep yourself hydrated with water, diet soda, or unsweetened iced tea. Keep in mind that regular soda, juice, and other mixers may contain a lot of sugar and must be counted as carbs. What are tips for following this plan? Reading food labels Start by checking the serving size on the Nutrition Facts label of packaged foods and drinks. The number of calories and the amount of carbs, fats, and other nutrients listed on the label are based on one serving of the item. Many items contain more than one serving per package. Check the total grams (g) of carbs in one serving. Check the number of grams of saturated fats and trans fats in one serving. Choose foods that have a low amount or none of these fats. Check the number of milligrams (mg) of salt (sodium) in one serving. Most people should limit total sodium intake to less than 2,300 mg per day. Always check the nutrition information of foods labeled as "low-fat" or "nonfat." These foods may be higher in added sugar or refined carbs and should be avoided. Talk to your dietitian to identify your daily goals for nutrients listed on the label. Shopping Avoid buying canned, pre-made, or processed foods. These foods tend to be high in fat, sodium, and  added sugar. Shop around the outside edge of the grocery store. This is where you will most often find fresh fruits and vegetables, bulk grains, fresh meats, and fresh dairy products. Cooking Use low-heat cooking methods, such as baking, instead of high-heat cooking methods, such as deep frying. Cook using healthy oils, such as olive, canola, or sunflower oil. Avoid cooking with butter, cream, or high-fat meats. Meal planning Eat meals and  snacks regularly, preferably at the same times every day. Avoid going long periods of time without eating. Eat foods that are high in fiber, such as fresh fruits, vegetables, beans, and whole grains. Eat 4-6 oz (112-168 g) of lean protein each day, such as lean meat, chicken, fish, eggs, or tofu. One ounce (oz) (28 g) of lean protein is equal to: 1 oz (28 g) of meat, chicken, or fish. 1 egg.  cup (62 g) of tofu. Eat some foods each day that contain healthy fats, such as avocado, nuts, seeds, and fish. What foods should I eat? Fruits Berries. Apples. Oranges. Peaches. Apricots. Plums. Grapes. Mangoes. Papayas. Pomegranates. Kiwi. Cherries. Vegetables Leafy greens, including lettuce, spinach, kale, chard, collard greens, mustard greens, and cabbage. Beets. Cauliflower. Broccoli. Carrots. Green beans. Tomatoes. Peppers. Onions. Cucumbers. Brussels sprouts. Grains Whole grains, such as whole-wheat or whole-grain bread, crackers, tortillas, cereal, and pasta. Unsweetened oatmeal. Quinoa. Brown or wild rice. Meats and other proteins Seafood. Poultry without skin. Lean cuts of poultry and beef. Tofu. Nuts. Seeds. Dairy Low-fat or fat-free dairy products such as milk, yogurt, and cheese. The items listed above may not be a complete list of foods and beverages you can eat and drink. Contact a dietitian for more information. What foods should I avoid? Fruits Fruits canned with syrup. Vegetables Canned vegetables. Frozen vegetables with butter or cream sauce. Grains Refined white flour and flour products such as bread, pasta, snack foods, and cereals. Avoid all processed foods. Meats and other proteins Fatty cuts of meat. Poultry with skin. Breaded or fried meats. Processed meat. Avoid saturated fats. Dairy Full-fat yogurt, cheese, or milk. Beverages Sweetened drinks, such as soda or iced tea. The items listed above may not be a complete list of foods and beverages you should avoid. Contact a  dietitian for more information. Questions to ask a health care provider Do I need to meet with a certified diabetes care and education specialist? Do I need to meet with a dietitian? What number can I call if I have questions? When are the best times to check my blood glucose? Where to find more information: American Diabetes Association: diabetes.org Academy of Nutrition and Dietetics: eatright.[employer] of Diabetes and Digestive and Kidney Diseases: [employer] Association of Diabetes Care & Education Specialists: diabeteseducator.org Summary It is important to have healthy eating habits because your blood sugar (glucose) levels are greatly affected by what you eat and drink. It is important to use alcohol carefully. A healthy meal plan will help you manage your blood glucose and lower your risk of heart disease. Your health care provider may recommend that you work with a dietitian to make a meal plan that is best for you. This information is not intended to replace advice given to you by your health care provider. Make sure you discuss any questions you have with your health care provider. Document Revised: 10/01/2019 Document Reviewed: 10/01/2019 Elsevier Patient Education  [employer].

## 2021-02-18 NOTE — Progress Notes (Signed)
Subjective:  Patient ID: Calvin [name]., male    DOB: 1950-10-27  Age: 70 y.o. MRN: [id number]  CC: Follow-up (6 month f/u on HTN and cholesterol. /Pt is fasting. )  HPI  Hyperglycemia hgbA1c of 5.9% which indicates high risk of diabetes. This is a great difference from value reported by home nurse. I recommend maintain appt with nutritions and starting regular exercise as we discussed.  F/up in 42months.   Mixed hyperlipidemia Stable lipid panel with low HDL which indicates need for regular exercise. Maintain crestor dose  Essential hypertension BP at goal with irbesartan and veraparmil BP Readings from Last 3 Encounters:  02/18/21 134/70  02/15/21 138/78  11/12/20 138/77   maintain med doses  Wt Readings from Last 3 Encounters:  02/18/21 267 lb 9.6 oz (121.4 kg)  02/15/21 268 lb (121.6 kg)  11/12/20 260 lb 6 oz (118.1 kg)    Reviewed past Medical, Social and Family history today.  Outpatient Medications Prior to Visit  Medication Sig Dispense Refill   acetaminophen (TYLENOL) 500 MG tablet Take 1,000 mg by mouth every 6 (six) hours as needed.     aspirin 81 MG tablet Take 81 mg by mouth daily.     diclofenac Sodium (VOLTAREN) 1 % GEL Apply 2 g topically 4 (four) times daily as needed.     finasteride (PROSCAR) 5 MG tablet Take 5 mg by mouth daily.     fluticasone (FLONASE) 50 MCG/ACT nasal spray Place 2 sprays into both nostrils daily.     loratadine (CLARITIN) 10 MG tablet Take 10 mg by mouth daily.     pantoprazole (PROTONIX) 40 MG tablet Take 1 tablet (40 mg total) by mouth daily. **PLEASE CALL OFFICE TO SCHEDULE FOLLOW UP 90 tablet 0   sildenafil (VIAGRA) 100 MG tablet Take 1 tablet (100 mg total) by mouth as needed for erectile dysfunction. Yearly physical w/labs due in February must see MD for refills 10 tablet 0   tamsulosin (FLOMAX) 0.4 MG CAPS capsule TAKE 1 CAPSULE BY MOUTH  DAILY 90 capsule 3   verapamil (CALAN-SR) 240 MG CR tablet TAKE 1 TABLET BY MOUTH  AT  BEDTIME 90 tablet 3   allopurinol (ZYLOPRIM) 300 MG tablet TAKE 1 TABLET BY MOUTH  DAILY 90 tablet 3   fenofibrate 54 MG tablet Take 1 tablet (54 mg total) by mouth daily. 90 tablet 3   irbesartan (AVAPRO) 300 MG tablet TAKE 1 TABLET BY MOUTH  DAILY 90 tablet 3   rosuvastatin (CRESTOR) 20 MG tablet Take 1 tablet (20 mg total) by mouth daily. 90 tablet 3   No facility-administered medications prior to visit.    ROS See HPI  Objective:  BP 134/70 (BP Location: Left Arm, Patient Position: Sitting, Cuff Size: Large)   Pulse 68   Temp (!) 96 F (35.6 C) (Temporal)   Wt 267 lb 9.6 oz (121.4 kg)   SpO2 100%   BMI 40.69 kg/m   Physical Exam Vitals reviewed.  Cardiovascular:     Rate and Rhythm: Normal rate and regular rhythm.     Pulses: Normal pulses.     Heart sounds: Normal heart sounds.  Pulmonary:     Effort: Pulmonary effort is normal.     Breath sounds: Normal breath sounds.  Musculoskeletal:     Right lower leg: No edema.     Left lower leg: No edema.  Neurological:     Mental Status: He is alert and oriented to person, place, and time.  Psychiatric:        Mood and Affect: Mood normal.        Behavior: Behavior normal.        Thought Content: Thought content normal.    Assessment & Plan:  This visit occurred during the SARS-CoV-2 public health emergency.  Safety protocols were in place, including screening questions prior to the visit, additional usage of staff PPE, and extensive cleaning of exam room while observing appropriate contact time as indicated for disinfecting solutions.   Josejuan was seen today for follow-up.  Diagnoses and all orders for this visit:  Essential hypertension -     irbesartan (AVAPRO) 300 MG tablet; Take 1 tablet (300 mg total) by mouth daily.  Mixed hyperlipidemia -     Lipid panel -     fenofibrate 54 MG tablet; Take 1 tablet (54 mg total) by mouth daily. -     rosuvastatin (CRESTOR) 20 MG tablet; Take 1 tablet (20 mg total) by  mouth daily.  Hyperglycemia -     Hemoglobin A1c -     Referral to Nutrition and Diabetes Services  Crushing injury of right hand, initial encounter -     DG Hand Complete Right; Future  Chronic gout of left knee, unspecified cause -     allopurinol (ZYLOPRIM) 300 MG tablet; Take 1 tablet (300 mg total) by mouth daily.   Problem List Items Addressed This Visit       Cardiovascular and Mediastinum   Essential hypertension - Primary    BP at goal with irbesartan and veraparmil BP Readings from Last 3 Encounters:  02/18/21 134/70  02/15/21 138/78  11/12/20 138/77   maintain med doses      Relevant Medications   fenofibrate 54 MG tablet   irbesartan (AVAPRO) 300 MG tablet   rosuvastatin (CRESTOR) 20 MG tablet     Other   GOUT   Relevant Medications   allopurinol (ZYLOPRIM) 300 MG tablet   Hyperglycemia    hgbA1c of 5.9% which indicates high risk of diabetes. This is a great difference from value reported by home nurse. I recommend maintain appt with nutritions and starting regular exercise as we discussed.  F/up in 25months.       Relevant Orders   Hemoglobin A1c (Completed)   Referral to Nutrition and Diabetes Services   Mixed hyperlipidemia    Stable lipid panel with low HDL which indicates need for regular exercise. Maintain crestor dose      Relevant Medications   fenofibrate 54 MG tablet   irbesartan (AVAPRO) 300 MG tablet   rosuvastatin (CRESTOR) 20 MG tablet   Other Relevant Orders   Lipid panel (Completed)   Other Visit Diagnoses     Crushing injury of right hand, initial encounter       Relevant Orders   DG Hand Complete Right       Follow-up: Return in about 3 months (around 05/19/2021) for DM and HTN, hyperlipidemia (fasting).  Wilfred Lacy, NP

## 2021-02-19 ENCOUNTER — Encounter: Payer: Self-pay | Admitting: Nurse Practitioner

## 2021-02-19 MED ORDER — IRBESARTAN 300 MG PO TABS: 300.0000 mg | ORAL_TABLET | Freq: Every day | ORAL | 3 refills | Status: DC

## 2021-02-19 MED ORDER — FENOFIBRATE 54 MG PO TABS: 54.0000 mg | ORAL_TABLET | Freq: Every day | ORAL | 3 refills | Status: DC

## 2021-02-19 MED ORDER — ALLOPURINOL 300 MG PO TABS
300.0000 mg | ORAL_TABLET | Freq: Every day | ORAL | 3 refills | Status: DC
Start: 2021-02-19 — End: 2021-12-26

## 2021-02-19 MED ORDER — ROSUVASTATIN CALCIUM 20 MG PO TABS: 20.0000 mg | ORAL_TABLET | Freq: Every day | ORAL | 3 refills | Status: DC

## 2021-02-19 NOTE — Assessment & Plan Note (Signed)
Stable lipid panel with low HDL which indicates need for regular exercise. Maintain crestor dose

## 2021-02-19 NOTE — Assessment & Plan Note (Signed)
hgbA1c of 5.9% which indicates high risk of diabetes. This is a great difference from value reported by home nurse. I recommend maintain appt with nutritions and starting regular exercise as we discussed.  F/up in 47months.

## 2021-02-19 NOTE — Assessment & Plan Note (Signed)
BP at goal with irbesartan and veraparmil BP Readings from Last 3 Encounters:  02/18/21 134/70  02/15/21 138/78  11/12/20 138/77   maintain med doses

## 2021-04-01 DIAGNOSIS — G4733 Obstructive sleep apnea (adult) (pediatric): Secondary | ICD-10-CM | POA: Diagnosis not present

## 2021-04-18 ENCOUNTER — Other Ambulatory Visit: Payer: Self-pay | Admitting: Internal Medicine

## 2021-04-25 ENCOUNTER — Ambulatory Visit: Payer: Medicare Other | Admitting: Pulmonary Disease

## 2021-04-25 ENCOUNTER — Other Ambulatory Visit: Payer: Self-pay

## 2021-04-25 ENCOUNTER — Encounter: Payer: Self-pay | Admitting: Pulmonary Disease

## 2021-04-25 VITALS — BP 126/88 | HR 72 | Temp 97.7°F | Ht 68.0 in | Wt 249.6 lb

## 2021-04-25 DIAGNOSIS — Z9989 Dependence on other enabling machines and devices: Secondary | ICD-10-CM

## 2021-04-25 DIAGNOSIS — G4733 Obstructive sleep apnea (adult) (pediatric): Secondary | ICD-10-CM

## 2021-04-25 NOTE — Patient Instructions (Signed)
Follow up in 1 year.

## 2021-04-25 NOTE — Progress Notes (Signed)
[facility]  Chief Complaint  Patient presents with   Follow-up    Patient has no complaints.     Past Surgical History:  He  has a past surgical history that includes Rotator cuff repair; Throat surgery; Polypectomy; and Colonoscopy.  Past Medical History:  Sickle Cell Trait, HTN, HLD, Gout, Diverticulosis, OA, Allergies  Constitutional:  BP 126/88 (BP Location: Left Arm, Patient Position: Sitting, Cuff Size: Normal)    Pulse 72    Temp 97.7 F (36.5 C) (Oral)    Ht 5\' 8"  (1.727 m)    Wt 249 lb 9.6 oz (113.2 kg)    SpO2 100%    BMI 37.95 kg/m   Brief Summary:  Calvin [name]. is a 71 y.o. male with obstructive sleep apnea.      Subjective:   He has lost about 25 lbs since he was last seen in our office.  He was told his A1C was elevated and decided to modify his lifestyle.  He exercises every day and has changed his diet.  He has appointment with dietician later this month.  He doesn't know what his goal weight will be, but he thinks he can get his weight down further.  He uses CPAP nightly.  No issues with mask fit.   Physical Exam:   Appearance - well kempt   ENMT - no sinus tenderness, no oral exudate, no LAN, Mallampati 3 airway, no stridor  Respiratory - equal breath sounds bilaterally, no wheezing or rales  CV - s1s2 regular rate and rhythm, no murmurs  Ext - no clubbing, no edema  Skin - no rashes  Psych - normal mood and affect   Sleep Tests:  HST 12/29/13 >> AHI 62.8, SaO2 low 71%. Auto CPAP 03/23/21 to 04/21/21 >> used on 30 of 30 nights with average 8 hrs 11 min.  Average AHI 1.7 with median CPAP 7 and 95 th percentile CPAP 10 cm H2O  Social History:  He  reports that he quit smoking about 43 years ago. His smoking use included cigarettes. He has a 10.00 pack-year smoking history. He has never used smokeless tobacco. He reports that he does not drink alcohol and does not use drugs.  Family History:  His  family history is not on file.     Assessment/Plan:   Obstructive sleep apnea. - he is compliant with CPAP and reports benefit from therapy - he uses [facility] for his DME - continue auto CPAP 5 to 20 cm H2O - discussed pros/cons of Inspire device  Obesity. - encouraged him to keep up with weight loss efforts - explained how we could revisit status of sleep apnea if he continues to lose weight  Time Spent Involved in Patient Care on Day of Examination:  25 minutes  Follow up:   Patient Instructions  Follow up in 1 year  Medication List:   Allergies as of 04/25/2021   No Known Allergies      Medication List        Accurate as of April 25, 2021 11:19 AM. If you have any questions, ask your nurse or doctor.          acetaminophen 500 MG tablet Commonly known as: TYLENOL Take 1,000 mg by mouth every 6 (six) hours as needed.   allopurinol 300 MG tablet Commonly known as: ZYLOPRIM Take 1 tablet (300 mg total) by mouth daily.   aspirin 81 MG tablet Take 81 mg by mouth  daily.   diclofenac Sodium 1 % Gel Commonly known as: VOLTAREN Apply 2 g topically 4 (four) times daily as needed.   fenofibrate 54 MG tablet Take 1 tablet (54 mg total) by mouth daily.   finasteride 5 MG tablet Commonly known as: PROSCAR Take 5 mg by mouth daily.   fluticasone 50 MCG/ACT nasal spray Commonly known as: FLONASE Place 2 sprays into both nostrils daily.   irbesartan 300 MG tablet Commonly known as: AVAPRO Take 1 tablet (300 mg total) by mouth daily.   loratadine 10 MG tablet Commonly known as: CLARITIN Take 10 mg by mouth daily.   pantoprazole 40 MG tablet Commonly known as: PROTONIX TAKE 1 TABLET BY MOUTH DAILY  PLEASE CALL OFFICE TO SCHEDULE  FOLLOW UP   rosuvastatin 20 MG tablet Commonly known as: Crestor Take 1 tablet (20 mg total) by mouth daily.   sildenafil 100 MG tablet Commonly known as: Viagra Take 1 tablet (100 mg total) by mouth as needed for  erectile dysfunction. Yearly physical w/labs due in February must see MD for refills   tamsulosin 0.4 MG Caps capsule Commonly known as: FLOMAX TAKE 1 CAPSULE BY MOUTH  DAILY   verapamil 240 MG CR tablet Commonly known as: CALAN-SR TAKE 1 TABLET BY MOUTH AT  BEDTIME        Signature:  [name], MD [place] Pager - [phone] 04/25/2021, 11:19 AM

## 2021-04-26 ENCOUNTER — Encounter: Payer: Self-pay | Admitting: Nurse Practitioner

## 2021-04-26 DIAGNOSIS — R350 Frequency of micturition: Secondary | ICD-10-CM

## 2021-04-26 DIAGNOSIS — N401 Enlarged prostate with lower urinary tract symptoms: Secondary | ICD-10-CM

## 2021-04-27 ENCOUNTER — Other Ambulatory Visit: Payer: Self-pay | Admitting: Nurse Practitioner

## 2021-04-27 DIAGNOSIS — N401 Enlarged prostate with lower urinary tract symptoms: Secondary | ICD-10-CM

## 2021-04-27 MED ORDER — FINASTERIDE 5 MG PO TABS: 5.0000 mg | ORAL_TABLET | Freq: Every day | ORAL | 0 refills | Status: DC

## 2021-04-28 ENCOUNTER — Other Ambulatory Visit: Payer: Self-pay

## 2021-04-28 ENCOUNTER — Other Ambulatory Visit: Payer: Self-pay | Admitting: *Deleted

## 2021-04-28 ENCOUNTER — Inpatient Hospital Stay: Payer: Medicare Other | Attending: Hematology and Oncology

## 2021-04-28 DIAGNOSIS — D649 Anemia, unspecified: Secondary | ICD-10-CM | POA: Diagnosis not present

## 2021-04-28 DIAGNOSIS — D573 Sickle-cell trait: Secondary | ICD-10-CM | POA: Insufficient documentation

## 2021-04-28 DIAGNOSIS — Z87891 Personal history of nicotine dependence: Secondary | ICD-10-CM | POA: Diagnosis not present

## 2021-04-28 DIAGNOSIS — I10 Essential (primary) hypertension: Secondary | ICD-10-CM | POA: Diagnosis not present

## 2021-04-28 DIAGNOSIS — D472 Monoclonal gammopathy: Secondary | ICD-10-CM | POA: Diagnosis not present

## 2021-04-28 DIAGNOSIS — D72819 Decreased white blood cell count, unspecified: Secondary | ICD-10-CM | POA: Diagnosis not present

## 2021-04-28 DIAGNOSIS — D696 Thrombocytopenia, unspecified: Secondary | ICD-10-CM | POA: Insufficient documentation

## 2021-04-28 LAB — CBC WITH DIFFERENTIAL (CANCER CENTER ONLY)
Abs Immature Granulocytes: 0 10*3/uL (ref 0.00–0.07)
Basophils Absolute: 0 10*3/uL (ref 0.0–0.1)
Basophils Relative: 0 %
Eosinophils Absolute: 0.1 10*3/uL (ref 0.0–0.5)
Eosinophils Relative: 2 %
HCT: 34.5 % — ABNORMAL LOW (ref 39.0–52.0)
Hemoglobin: 11.3 g/dL — ABNORMAL LOW (ref 13.0–17.0)
Immature Granulocytes: 0 %
Lymphocytes Relative: 33 %
Lymphs Abs: 1 10*3/uL (ref 0.7–4.0)
MCH: 28.5 pg (ref 26.0–34.0)
MCHC: 32.8 g/dL (ref 30.0–36.0)
MCV: 86.9 fL (ref 80.0–100.0)
Monocytes Absolute: 0.2 10*3/uL (ref 0.1–1.0)
Monocytes Relative: 7 %
Neutro Abs: 1.8 10*3/uL (ref 1.7–7.7)
Neutrophils Relative %: 58 %
Platelet Count: 163 10*3/uL (ref 150–400)
RBC: 3.97 MIL/uL — ABNORMAL LOW (ref 4.22–5.81)
RDW: 14.3 % (ref 11.5–15.5)
WBC Count: 3.1 10*3/uL — ABNORMAL LOW (ref 4.0–10.5)
nRBC: 0 % (ref 0.0–0.2)

## 2021-04-28 LAB — CMP (CANCER CENTER ONLY)
ALT: 11 U/L (ref 0–44)
AST: 20 U/L (ref 15–41)
Albumin: 4.4 g/dL (ref 3.5–5.0)
Alkaline Phosphatase: 56 U/L (ref 38–126)
Anion gap: 5 (ref 5–15)
BUN: 19 mg/dL (ref 8–23)
CO2: 28 mmol/L (ref 22–32)
Calcium: 9 mg/dL (ref 8.9–10.3)
Chloride: 109 mmol/L (ref 98–111)
Creatinine: 1.33 mg/dL — ABNORMAL HIGH (ref 0.61–1.24)
GFR, Estimated: 57 mL/min — ABNORMAL LOW (ref >60)
Glucose, Bld: 98 mg/dL (ref 70–99)
Potassium: 4.3 mmol/L (ref 3.5–5.1)
Sodium: 142 mmol/L (ref 135–145)
Total Bilirubin: 0.5 mg/dL (ref 0.3–1.2)
Total Protein: 7.4 g/dL (ref 6.5–8.1)

## 2021-04-28 LAB — LACTATE DEHYDROGENASE: LDH: 131 U/L (ref 98–192)

## 2021-04-29 LAB — KAPPA/LAMBDA LIGHT CHAINS
Kappa free light chain: 58.9 mg/L — ABNORMAL HIGH (ref 3.3–19.4)
Kappa, lambda light chain ratio: 2.44 — ABNORMAL HIGH (ref 0.26–1.65)
Lambda free light chains: 24.1 mg/L (ref 5.7–26.3)

## 2021-05-02 ENCOUNTER — Other Ambulatory Visit: Payer: Self-pay

## 2021-05-02 ENCOUNTER — Ambulatory Visit (INDEPENDENT_AMBULATORY_CARE_PROVIDER_SITE_OTHER): Payer: Medicare Other | Admitting: Nurse Practitioner

## 2021-05-02 ENCOUNTER — Encounter: Payer: Self-pay | Admitting: Nurse Practitioner

## 2021-05-02 VITALS — BP 122/72 | HR 72 | Temp 96.8°F | Ht 67.5 in | Wt 248.2 lb

## 2021-05-02 DIAGNOSIS — R739 Hyperglycemia, unspecified: Secondary | ICD-10-CM | POA: Diagnosis not present

## 2021-05-02 DIAGNOSIS — G4733 Obstructive sleep apnea (adult) (pediatric): Secondary | ICD-10-CM | POA: Diagnosis not present

## 2021-05-02 LAB — MULTIPLE MYELOMA PANEL, SERUM
Albumin SerPl Elph-Mcnc: 3.9 g/dL (ref 2.9–4.4)
Albumin/Glob SerPl: 1.3 (ref 0.7–1.7)
Alpha 1: 0.2 g/dL (ref 0.0–0.4)
Alpha2 Glob SerPl Elph-Mcnc: 0.7 g/dL (ref 0.4–1.0)
B-Globulin SerPl Elph-Mcnc: 1 g/dL (ref 0.7–1.3)
Gamma Glob SerPl Elph-Mcnc: 1.3 g/dL (ref 0.4–1.8)
Globulin, Total: 3.2 g/dL (ref 2.2–3.9)
IgA: 169 mg/dL (ref 61–437)
IgG (Immunoglobin G), Serum: 1197 mg/dL (ref 603–1613)
IgM (Immunoglobulin M), Srm: 99 mg/dL (ref 15–143)
M Protein SerPl Elph-Mcnc: 0.2 g/dL — ABNORMAL HIGH
Total Protein ELP: 7.1 g/dL (ref 6.0–8.5)

## 2021-05-02 LAB — POCT GLYCOSYLATED HEMOGLOBIN (HGB A1C): Hemoglobin A1C: 5.4 % (ref 4.0–5.6)

## 2021-05-02 NOTE — Assessment & Plan Note (Signed)
Has made dietary modifications and start daily exercise (cardio and weight training) 5days a week)  Today's hgbA1c at 5.4% Advised to maintain current modifications F/up in 61months

## 2021-05-02 NOTE — Patient Instructions (Signed)
HgbA1c at 5.4%: this is normal range. Continue current modifications. Use the American Diabetes Association for additional information. F/up in 65months (fasting)  Diabetes Mellitus and Nutrition, Adult When you have diabetes, or diabetes mellitus, it is very important to have healthy eating habits because your blood sugar (glucose) levels are greatly affected by what you eat and drink. Eating healthy foods in the right amounts, at about the same times every day, can help you: Manage your blood glucose. Lower your risk of heart disease. Improve your blood pressure. Reach or maintain a healthy weight. What can affect my meal plan? Every person with diabetes is different, and each person has different needs for a meal plan. Your health care provider may recommend that you work with a dietitian to make a meal plan that is best for you. Your meal plan may vary depending on factors such as: The calories you need. The medicines you take. Your weight. Your blood glucose, blood pressure, and cholesterol levels. Your activity level. Other health conditions you have, such as heart or kidney disease. How do carbohydrates affect me? Carbohydrates, also called carbs, affect your blood glucose level more than any other type of food. Eating carbs raises the amount of glucose in your blood. It is important to know how many carbs you can safely have in each meal. This is different for every person. Your dietitian can help you calculate how many carbs you should have at each meal and for each snack. How does alcohol affect me? Alcohol can cause a decrease in blood glucose (hypoglycemia), especially if you use insulin or take certain diabetes medicines by mouth. Hypoglycemia can be a life-threatening condition. Symptoms of hypoglycemia, such as sleepiness, dizziness, and confusion, are similar to symptoms of having too much alcohol. Do not drink alcohol if: Your health care provider tells you not to drink. You  are pregnant, may be pregnant, or are planning to become pregnant. If you drink alcohol: Limit how much you have to: 0-1 drink a day for women. 0-2 drinks a day for men. Know how much alcohol is in your drink. In the [place] one drink equals one 12 oz bottle of beer (355 mL), one 5 oz glass of wine (148 mL), or one 1 oz glass of hard liquor (44 mL). Keep yourself hydrated with water, diet soda, or unsweetened iced tea. Keep in mind that regular soda, juice, and other mixers may contain a lot of sugar and must be counted as carbs. What are tips for following this plan? Reading food labels Start by checking the serving size on the Nutrition Facts label of packaged foods and drinks. The number of calories and the amount of carbs, fats, and other nutrients listed on the label are based on one serving of the item. Many items contain more than one serving per package. Check the total grams (g) of carbs in one serving. Check the number of grams of saturated fats and trans fats in one serving. Choose foods that have a low amount or none of these fats. Check the number of milligrams (mg) of salt (sodium) in one serving. Most people should limit total sodium intake to less than 2,300 mg per day. Always check the nutrition information of foods labeled as "low-fat" or "nonfat." These foods may be higher in added sugar or refined carbs and should be avoided. Talk to your dietitian to identify your daily goals for nutrients listed on the label. Shopping Avoid buying canned, pre-made, or processed foods. These foods tend  to be high in fat, sodium, and added sugar. Shop around the outside edge of the grocery store. This is where you will most often find fresh fruits and vegetables, bulk grains, fresh meats, and fresh dairy products. Cooking Use low-heat cooking methods, such as baking, instead of high-heat cooking methods, such as deep frying. Cook using healthy oils, such as olive, canola, or sunflower  oil. Avoid cooking with butter, cream, or high-fat meats. Meal planning Eat meals and snacks regularly, preferably at the same times every day. Avoid going long periods of time without eating. Eat foods that are high in fiber, such as fresh fruits, vegetables, beans, and whole grains. Eat 4-6 oz (112-168 g) of lean protein each day, such as lean meat, chicken, fish, eggs, or tofu. One ounce (oz) (28 g) of lean protein is equal to: 1 oz (28 g) of meat, chicken, or fish. 1 egg.  cup (62 g) of tofu. Eat some foods each day that contain healthy fats, such as avocado, nuts, seeds, and fish. What foods should I eat? Fruits Berries. Apples. Oranges. Peaches. Apricots. Plums. Grapes. Mangoes. Papayas. Pomegranates. Kiwi. Cherries. Vegetables Leafy greens, including lettuce, spinach, kale, chard, collard greens, mustard greens, and cabbage. Beets. Cauliflower. Broccoli. Carrots. Green beans. Tomatoes. Peppers. Onions. Cucumbers. Brussels sprouts. Grains Whole grains, such as whole-wheat or whole-grain bread, crackers, tortillas, cereal, and pasta. Unsweetened oatmeal. Quinoa. Brown or wild rice. Meats and other proteins Seafood. Poultry without skin. Lean cuts of poultry and beef. Tofu. Nuts. Seeds. Dairy Low-fat or fat-free dairy products such as milk, yogurt, and cheese. The items listed above may not be a complete list of foods and beverages you can eat and drink. Contact a dietitian for more information. What foods should I avoid? Fruits Fruits canned with syrup. Vegetables Canned vegetables. Frozen vegetables with butter or cream sauce. Grains Refined white flour and flour products such as bread, pasta, snack foods, and cereals. Avoid all processed foods. Meats and other proteins Fatty cuts of meat. Poultry with skin. Breaded or fried meats. Processed meat. Avoid saturated fats. Dairy Full-fat yogurt, cheese, or milk. Beverages Sweetened drinks, such as soda or iced tea. The items  listed above may not be a complete list of foods and beverages you should avoid. Contact a dietitian for more information. Questions to ask a health care provider Do I need to meet with a certified diabetes care and education specialist? Do I need to meet with a dietitian? What number can I call if I have questions? When are the best times to check my blood glucose? Where to find more information: American Diabetes Association: diabetes.org Academy of Nutrition and Dietetics: eatright.[employer] of Diabetes and Digestive and Kidney Diseases: [employer]is Association of Diabetes Care & Education Specialists: diabeteseducator.org Summary It is important to have healthy eating habits because your blood sugar (glucose) levels are greatly affected by what you eat and drink. It is important to use alcohol carefully. A healthy meal plan will help you manage your blood glucose and lower your risk of heart disease. Your health care provider may recommend that you work with a dietitian to make a meal plan that is best for you. This information is not intended to replace advice given to you by your health care provider. Make sure you discuss any questions you have with your health care provider. Document Revised: 10/01/2019 Document Reviewed: 10/01/2019 Elsevier Patient Education  [place].

## 2021-05-02 NOTE — Progress Notes (Signed)
Subjective:  Patient ID: Calvin Broad., male    DOB: 10/03/1950  Age: 71 y.o. MRN: [id number]  CC: Follow-up (3 month f/u on DM, HTN, and cholesterol. /Pt is fasting. )  HPI  Hyperglycemia Has made dietary modifications and start daily exercise (cardio and weight training) 5days a week)  Today's hgbA1c at 5.4% Advised to maintain current modifications F/up in 37months  Wt Readings from Last 3 Encounters:  05/02/21 248 lb 3.2 oz (112.6 kg)  04/25/21 249 lb 9.6 oz (113.2 kg)  02/18/21 267 lb 9.6 oz (121.4 kg)    BP Readings from Last 3 Encounters:  05/02/21 122/72  04/25/21 126/88  02/18/21 134/70    Reviewed past Medical, Social and Family history today.  Outpatient Medications Prior to Visit  Medication Sig Dispense Refill   acetaminophen (TYLENOL) 500 MG tablet Take 1,000 mg by mouth every 6 (six) hours as needed.     allopurinol (ZYLOPRIM) 300 MG tablet Take 1 tablet (300 mg total) by mouth daily. 90 tablet 3   aspirin 81 MG tablet Take 81 mg by mouth daily.     diclofenac Sodium (VOLTAREN) 1 % GEL Apply 2 g topically 4 (four) times daily as needed.     fenofibrate 54 MG tablet Take 1 tablet (54 mg total) by mouth daily. 90 tablet 3   finasteride (PROSCAR) 5 MG tablet Take 1 tablet (5 mg total) by mouth daily. 30 tablet 0   fluticasone (FLONASE) 50 MCG/ACT nasal spray Place 2 sprays into both nostrils daily.     irbesartan (AVAPRO) 300 MG tablet Take 1 tablet (300 mg total) by mouth daily. 90 tablet 3   loratadine (CLARITIN) 10 MG tablet Take 10 mg by mouth daily.     pantoprazole (PROTONIX) 40 MG tablet TAKE 1 TABLET BY MOUTH DAILY  PLEASE CALL OFFICE TO SCHEDULE  FOLLOW UP 90 tablet 1   rosuvastatin (CRESTOR) 20 MG tablet Take 1 tablet (20 mg total) by mouth daily. 90 tablet 3   sildenafil (VIAGRA) 100 MG tablet Take 1 tablet (100 mg total) by mouth as needed for erectile dysfunction. Yearly physical w/labs due in February must see MD for refills 10 tablet 0    tamsulosin (FLOMAX) 0.4 MG CAPS capsule TAKE 1 CAPSULE BY MOUTH  DAILY 90 capsule 3   verapamil (CALAN-SR) 240 MG CR tablet TAKE 1 TABLET BY MOUTH AT  BEDTIME 90 tablet 3   No facility-administered medications prior to visit.   ROS See HPI  Objective:  BP 122/72 (BP Location: Left Arm, Patient Position: Sitting, Cuff Size: Large)    Pulse 72    Temp (!) 96.8 F (36 C) (Temporal)    Ht 5' 7.5" (1.715 m)    Wt 248 lb 3.2 oz (112.6 kg)    SpO2 98%    BMI 38.30 kg/m   Physical Exam Cardiovascular:     Rate and Rhythm: Normal rate.     Pulses: Normal pulses.  Pulmonary:     Effort: Pulmonary effort is normal.  Neurological:     Mental Status: He is alert and oriented to person, place, and time.  Psychiatric:        Mood and Affect: Mood normal.        Behavior: Behavior normal.        Thought Content: Thought content normal.   Assessment & Plan:  This visit occurred during the SARS-CoV-2 public health emergency.  Safety protocols were in place, including screening questions prior to  the visit, additional usage of staff PPE, and extensive cleaning of exam room while observing appropriate contact time as indicated for disinfecting solutions.   [name] was seen today for follow-up.  Diagnoses and all orders for this visit:  Hyperglycemia -     POCT glycosylated hemoglobin (Hb A1C)   Problem List Items Addressed This Visit       Other   Hyperglycemia - Primary    Has made dietary modifications and start daily exercise (cardio and weight training) 5days a week)  Today's hgbA1c at 5.4% Advised to maintain current modifications F/up in 54months      Relevant Orders   POCT glycosylated hemoglobin (Hb A1C) (Completed)     I have spent 63mins with this patient regarding history taking, documentation, review of labs, formulating plan and discussing treatment options with patient.  Follow-up: Return in about 3 months (around 07/30/2021) for DM and HTN, hyperlipidemia  (fasting).  Wilfred Lacy, NP

## 2021-05-05 ENCOUNTER — Inpatient Hospital Stay: Payer: Medicare Other | Admitting: Hematology and Oncology

## 2021-05-05 ENCOUNTER — Other Ambulatory Visit: Payer: Self-pay

## 2021-05-05 VITALS — BP 130/80 | HR 78 | Temp 97.4°F | Resp 16 | Wt 248.2 lb

## 2021-05-05 DIAGNOSIS — D696 Thrombocytopenia, unspecified: Secondary | ICD-10-CM | POA: Diagnosis not present

## 2021-05-05 DIAGNOSIS — I10 Essential (primary) hypertension: Secondary | ICD-10-CM | POA: Diagnosis not present

## 2021-05-05 DIAGNOSIS — D472 Monoclonal gammopathy: Secondary | ICD-10-CM

## 2021-05-05 DIAGNOSIS — Z87891 Personal history of nicotine dependence: Secondary | ICD-10-CM | POA: Diagnosis not present

## 2021-05-05 DIAGNOSIS — D649 Anemia, unspecified: Secondary | ICD-10-CM | POA: Diagnosis not present

## 2021-05-05 DIAGNOSIS — D72819 Decreased white blood cell count, unspecified: Secondary | ICD-10-CM | POA: Diagnosis not present

## 2021-05-05 IMAGING — DX DG BONE SURVEY MET
10 series · 10 of 10 positions shown · non-contrast
Comparison: CT 06/05/2018

CLINICAL DATA: MGUS

EXAM:
METASTATIC BONE SURVEY

[skull lat]
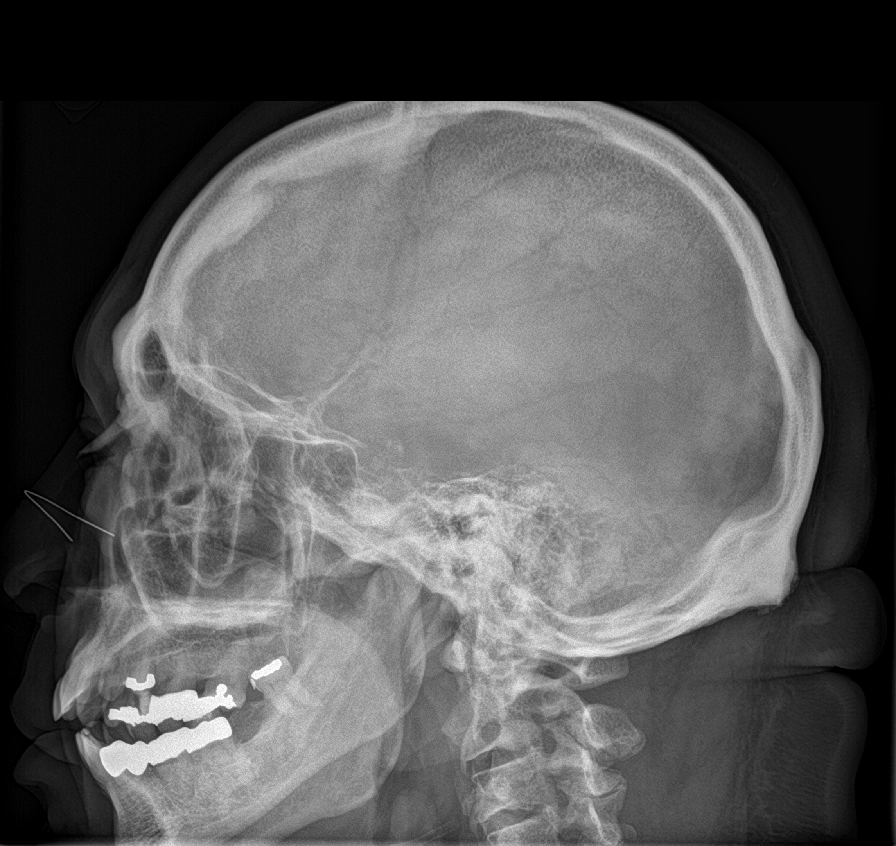

[shoulder ap (1 of 2)]
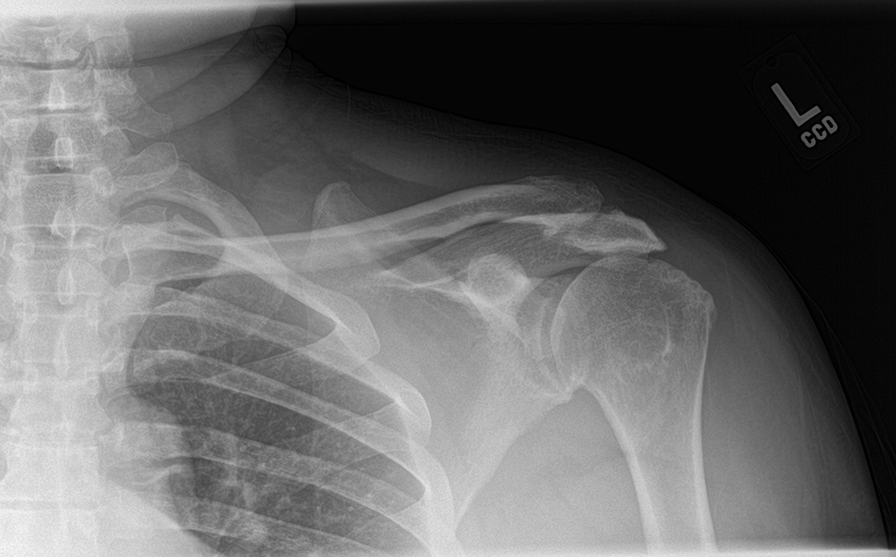

[shoulder ap (2 of 2)]
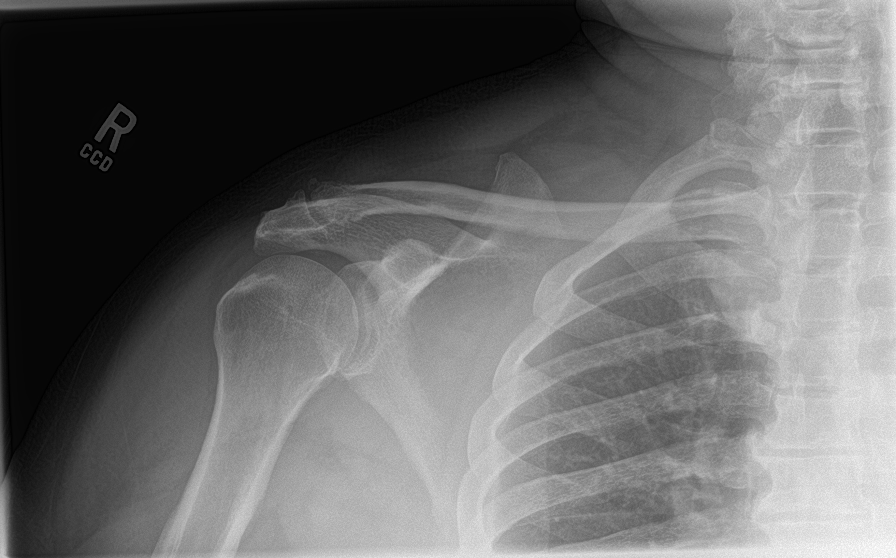

[humerus ap (1 of 2)]
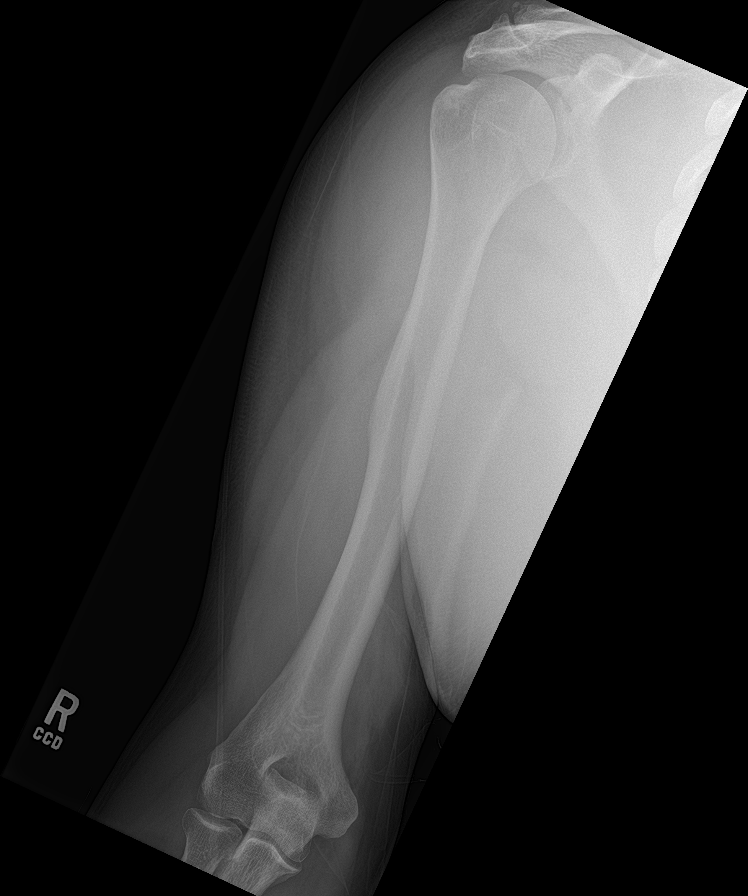

[humerus ap (2 of 2)]
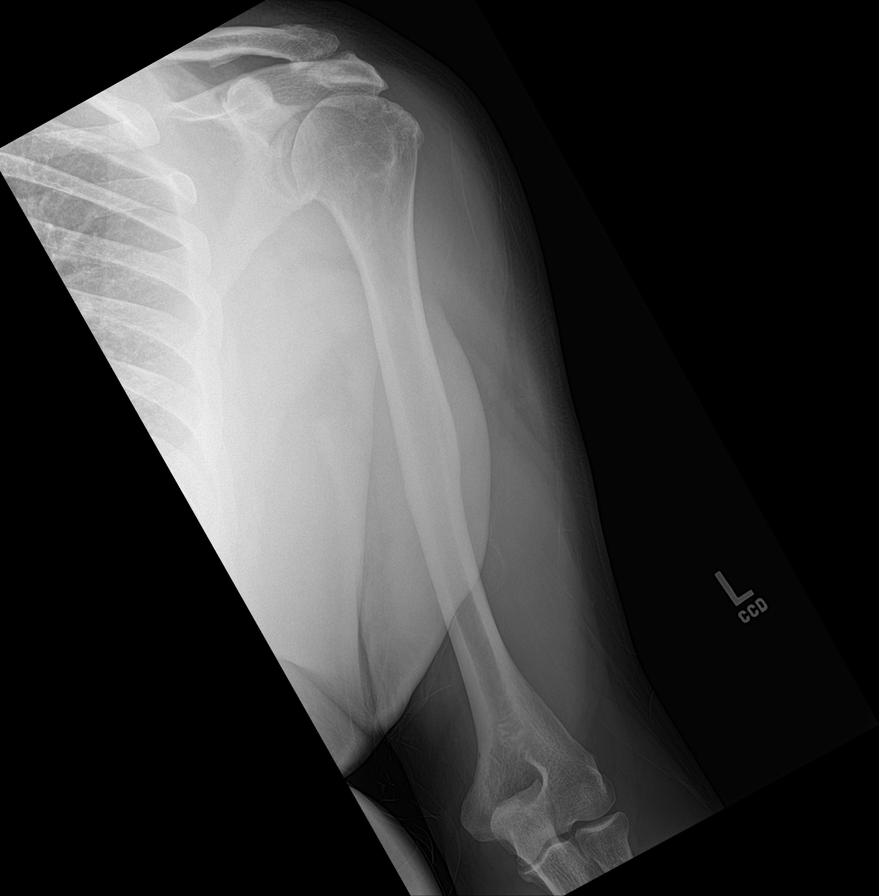

[forearm ap (1 of 2)]
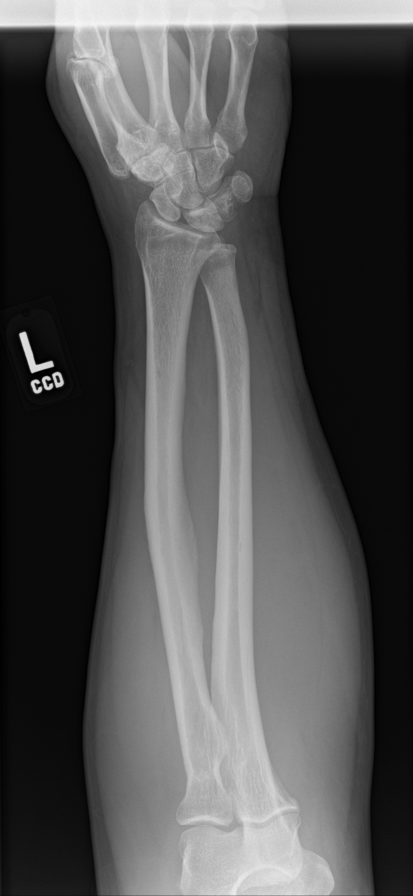

[forearm ap (2 of 2)]
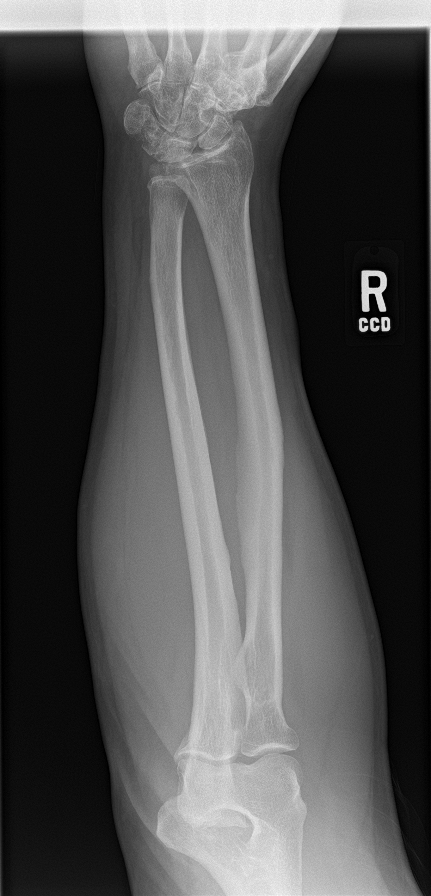

[c-spine ap]
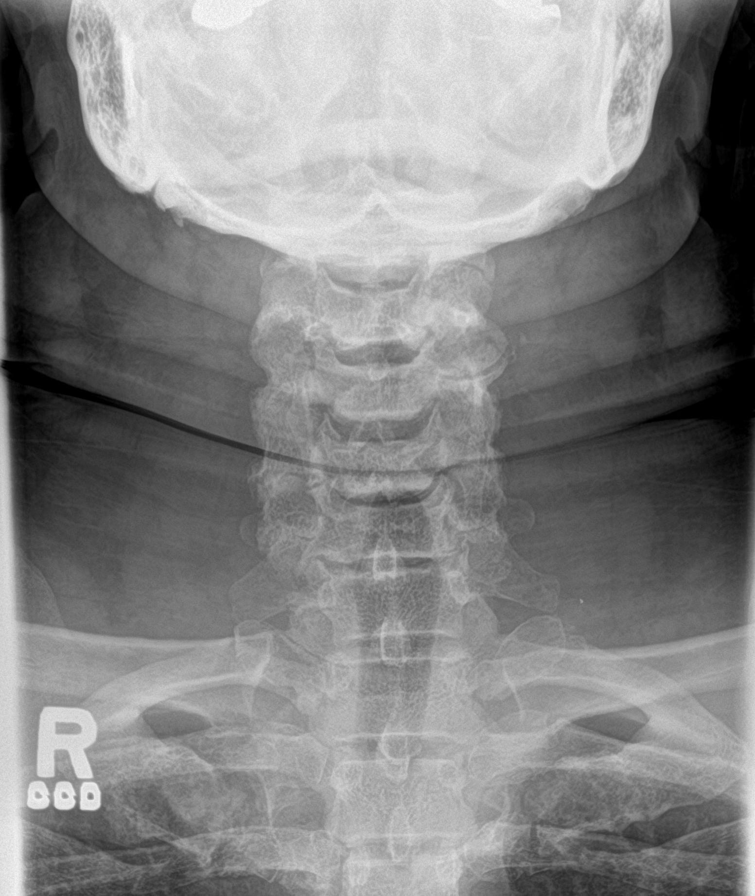

[c-spine lat]
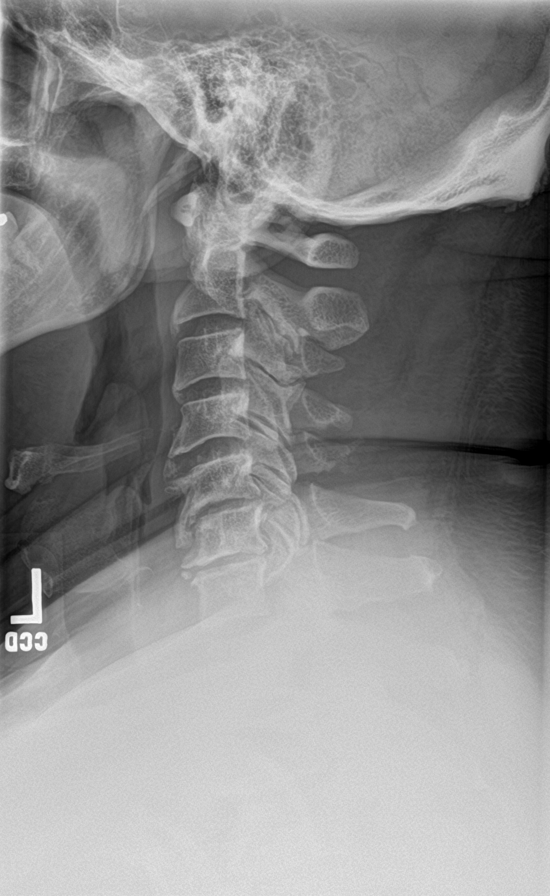

[t-spine ap]
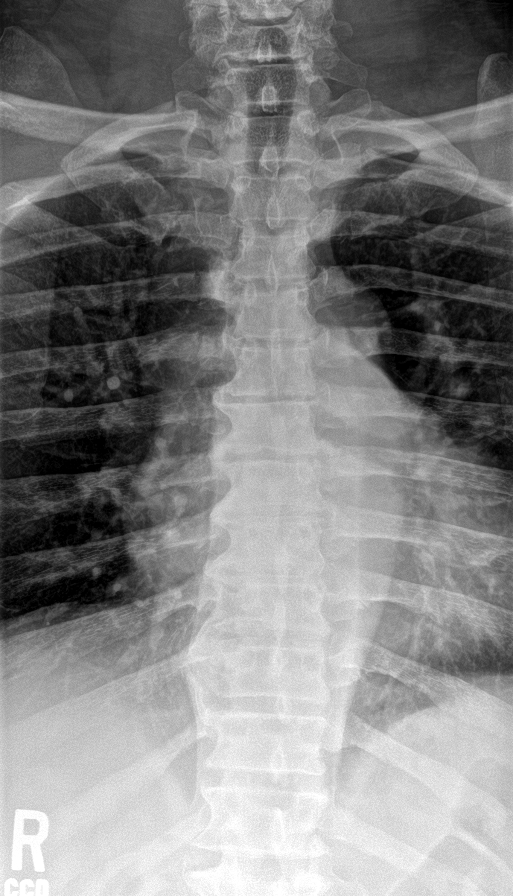

[10 of 10 positions shown; findings below may reference images not displayed]

FINDINGS: Images of the axial and appendicular skeleton are performed.

Calvarium:No lytic bone lesion identified.

Chest and Abdomen:No lytic bone lesion identified.  Lungs are clear.

Spine:No lytic bone lesion identified.  Multilevel spondylosis.

UPPER extremities:No lytic bone lesion identified.

Pelvis and LOWER extremities:No lytic bone lesion identified.
IMPRESSION: No lytic bone lesions identified.

## 2021-05-05 NOTE — Progress Notes (Signed)
[facility] [phone]   Fax:([phone]  PROGRESS NOTE  Patient Care Team: Nche, Charlene Brooke, NP as PCP - General (Internal Medicine) Franchot Gallo, MD as Consulting Physician (Urology) Netta Cedars, MD as Consulting Physician (Orthopedic Surgery) Germaine Pomfret, [facility] as Pharmacist (Pharmacist)  Hematological/Oncological History  # Normocytic Anemia 1) 05/29/2018: HB electrophoresis confirms sickle cell trait. Ferritin 18. Hgb 13.1. MMA 107 2) 01/22/2019: iron 112, TIBC 313, Sat 36%, ferritin 31. Hgb 12.6 3) 01/27/2019: establish care with Dr. Lorenso Courier. WBC 2.6, Hgb 12.6, Plt 121, MCV 91.4 4) 10/20/2019: WBC 3.0, Hgb 12.9, Plt 115, MCV 91   #IgG Kappa MGUS 1) 09/18/18:  M protein 0.2, kappa 38, lambda 17.6, ratio 2.2 2) 01/27/2019: establish care with Dr. Lorenso Courier  3) 10/20/2019: M protein 0.1, Kappa 48.3, Lambda 18, Ratio 2.68 4) 11/05/2020: M protein 0.3, Kappa 56, Lambda 19.2, Ratio 2.92 5) 04/28/2021: M protein 0.2, Kappa 58.9, Lambda 24.1, Ratio 2.44  #Leukopenia 1) Chronically low, with WBC 3.5 dating back to 03/24/2009 2) 2015-2020: stable at baseline WBC 3.5-5.0 3) 01/27/2019: establish care with Dr. Lorenso Courier.  WBC 2.6, Hgb 12.6, Plt 121, MCV 91.4 4) 10/20/2019: WBC 3.0, Hgb 12.9, Plt 115, MCV 91  Interval History:  [name] 71 y.o. male with medical history significant for MGUS presents for a follow up visit. He was last seen on 11/12/2020. In the interim since our last visit he has had no ED visits or hospitalizations.   On exam today Mr. Heinecke reports he has been well overall in the interim since her last visit.  He is doing his best to lose weight because his A1c has been increasing.  His weight is currently 248 pounds.  He reports his energy levels are good and exercise has been helping him feel much better.  He notes that he has decreased his intake of carbohydrates and sweets.  He also notes that his knee is feeling better now  that he is losing weight.  He otherwise denies any fevers, chills, sweats, nausea, vomiting or diarrhea.  Full 10 point ROS is listed below.    MEDICAL HISTORY:  Past Medical History:  Diagnosis Date   Allergy    Arthritis    Diverticulosis    Gout    Hyperlipidemia    Hypertension    Iron deficiency anemia 09/20/2018   OSA (obstructive sleep apnea) 11/13/2013   Sickle cell trait ([place])    Sleep apnea     SURGICAL HISTORY: Past Surgical History:  Procedure Laterality Date   COLONOSCOPY     POLYPECTOMY     ROTATOR CUFF REPAIR     left   THROAT SURGERY     polyps removed from vocal cord    SOCIAL HISTORY: Social History   Socioeconomic History   Marital status: Married    Spouse name: Not on file   Number of children: 1   Years of education: Not on file   Highest education level: Not on file  Occupational History   Occupation: retired  Tobacco Use   Smoking status: Former    Packs/day: 2.00    Years: 5.00    Pack years: 10.00    Types: Cigarettes    Quit date: 05/23/1977    Years since quitting: 43.9   Smokeless tobacco: Never  Vaping Use   Vaping Use: Never used  Substance and Sexual Activity   Alcohol use: No    Comment: QUIT 2002   Drug use: No  Comment: Completed program in april 2000   Sexual activity: Yes  Other Topics Concern   Not on file  Social History Narrative   Not on file   Social Determinants of Health   Financial Resource Strain: Low Risk    Difficulty of Paying Living Expenses: Not hard at all  Food Insecurity: No Food Insecurity   Worried About Charity fundraiser in the Last Year: Never true   [place] in the Last Year: Never true  Transportation Needs: No Transportation Needs   Lack of Transportation (Medical): No   Lack of Transportation (Non-Medical): No  Physical Activity: Insufficiently Active   Days of Exercise per Week: 3 days   Minutes of Exercise per Session: 30 min  Stress: No Stress Concern Present   Feeling of  Stress : Not at all  Social Connections: Socially Integrated   Frequency of Communication with Friends and Family: Twice a week   Frequency of Social Gatherings with Friends and Family: Twice a week   Attends Religious Services: More than 4 times per year   Active Member of [employer] or Organizations: Yes   Attends Music therapist: More than 4 times per year   Marital Status: Married  Human resources officer Violence: Not At Risk   Fear of Current or Ex-Partner: No   Emotionally Abused: No   Physically Abused: No   Sexually Abused: No    FAMILY HISTORY: Family History  Problem Relation Age of Onset   Colon cancer Neg Hx    Esophageal cancer Neg Hx    Rectal cancer Neg Hx    Stomach cancer Neg Hx     ALLERGIES:  has No Known Allergies.  MEDICATIONS:  Current Outpatient Medications  Medication Sig Dispense Refill   acetaminophen (TYLENOL) 500 MG tablet Take 1,000 mg by mouth every 6 (six) hours as needed.     allopurinol (ZYLOPRIM) 300 MG tablet Take 1 tablet (300 mg total) by mouth daily. 90 tablet 3   aspirin 81 MG tablet Take 81 mg by mouth daily.     diclofenac Sodium (VOLTAREN) 1 % GEL Apply 2 g topically 4 (four) times daily as needed.     fenofibrate 54 MG tablet Take 1 tablet (54 mg total) by mouth daily. 90 tablet 3   finasteride (PROSCAR) 5 MG tablet Take 1 tablet (5 mg total) by mouth daily. 30 tablet 0   fluticasone (FLONASE) 50 MCG/ACT nasal spray Place 2 sprays into both nostrils daily.     irbesartan (AVAPRO) 300 MG tablet Take 1 tablet (300 mg total) by mouth daily. 90 tablet 3   loratadine (CLARITIN) 10 MG tablet Take 10 mg by mouth daily.     pantoprazole (PROTONIX) 40 MG tablet TAKE 1 TABLET BY MOUTH DAILY  PLEASE CALL OFFICE TO SCHEDULE  FOLLOW UP 90 tablet 1   rosuvastatin (CRESTOR) 20 MG tablet Take 1 tablet (20 mg total) by mouth daily. 90 tablet 3   sildenafil (VIAGRA) 100 MG tablet Take 1 tablet (100 mg total) by mouth as needed for erectile  dysfunction. Yearly physical w/labs due in February must see MD for refills 10 tablet 0   tamsulosin (FLOMAX) 0.4 MG CAPS capsule TAKE 1 CAPSULE BY MOUTH  DAILY 90 capsule 3   verapamil (CALAN-SR) 240 MG CR tablet TAKE 1 TABLET BY MOUTH AT  BEDTIME 90 tablet 3   No current facility-administered medications for this visit.    REVIEW OF SYSTEMS:   Constitutional: ( - )  fevers, ( - )  chills , ( - ) night sweats Eyes: ( - ) blurriness of vision, ( - ) double vision, ( - ) watery eyes Ears, nose, mouth, throat, and face: ( - ) mucositis, ( - ) sore throat Respiratory: ( - ) cough, ( - ) dyspnea, ( - ) wheezes Cardiovascular: ( - ) palpitation, ( - ) chest discomfort, ( - ) lower extremity swelling Gastrointestinal:  ( - ) nausea, ( - ) heartburn, ( - ) change in bowel habits Skin: ( - ) abnormal skin rashes Lymphatics: ( - ) new lymphadenopathy, ( - ) easy bruising Neurological: ( - ) numbness, ( - ) tingling, ( - ) new weaknesses Behavioral/Psych: ( - ) mood change, ( - ) new changes  All other systems were reviewed with the patient and are negative.  PHYSICAL EXAMINATION: ECOG PERFORMANCE STATUS: 1 - Symptomatic but completely ambulatory  Vitals:   05/05/21 1004  BP: 130/80  Pulse: 78  Resp: 16  Temp: (!) 97.4 F (36.3 C)  SpO2: 97%   Filed Weights   05/05/21 1004  Weight: 248 lb 3.2 oz (112.6 kg)    GENERAL: well appearing African American male in NAD  SKIN: skin color, texture, turgor are normal, no rashes or significant lesions EYES: conjunctiva are pink and non-injected, sclera clear LUNGS: clear to auscultation and percussion with normal breathing effort HEART: regular rate & rhythm and no murmurs and no lower extremity edema Musculoskeletal: no cyanosis of digits and no clubbing  PSYCH: alert & oriented x 3, fluent speech NEURO: no focal motor/sensory deficits  LABORATORY DATA:  I have reviewed the data as listed CBC Latest Ref Rng & Units 04/28/2021 11/05/2020  04/19/2020  WBC 4.0 - 10.5 K/uL 3.1(L) 3.2(L) 2.9(L)  Hemoglobin 13.0 - 17.0 g/dL 11.3(L) 11.4(L) 12.1(L)  Hematocrit 39.0 - 52.0 % 34.5(L) 33.2(L) 35.9(L)  Platelets 150 - 400 K/uL 163 116(L) 142(L)   CMP Latest Ref Rng & Units 04/28/2021 11/05/2020 04/19/2020  Glucose 70 - 99 mg/dL 98 109(H) 100(H)  BUN 8 - 23 mg/dL _0 Creatinine 0.61 - 1.24 mg/dL 1.33(H) 1.60(H) 1.46(H)  Sodium 135 - 145 mmol/L 142 142 141  Potassium 3.5 - 5.1 mmol/L 4.3 3.9 3.9  Chloride 98 - 111 mmol/L 109 110 109  CO2 22 - 32 mmol/L _1 Calcium 8.9 - 10.3 mg/dL 9.0 9.1 8.7(L)  Total Protein 6.5 - 8.1 g/dL 7.4 7.3 7.1  Total Bilirubin 0.3 - 1.2 mg/dL 0.5 0.5 0.5  Alkaline Phos 38 - 126 U/L 56 55 65  AST 15 - 41 U/L _2 ALT 0 - 44 U/L _3 Lab Results  Component Value Date   KPAFRELGTCHN 58.9 (H) 04/28/2021   KPAFRELGTCHN 56.0 (H) 11/05/2020   KPAFRELGTCHN 45.1 (H) 04/19/2020   LAMBDASER 24.1 04/28/2021   LAMBDASER 19.2 11/05/2020   LAMBDASER 19.0 04/19/2020   KAPLAMBRATIO 2.44 (H) 04/28/2021   KAPLAMBRATIO 2.92 (H) 11/05/2020   KAPLAMBRATIO 2.37 (H) 04/19/2020    Lab Results  Component Value Date   MPROTEIN 0.2 (H) 04/28/2021   MPROTEIN 0.3 (H) 11/05/2020   MPROTEIN 0.2 (H) 04/19/2020     RADIOGRAPHIC STUDIES: None to review  ASSESSMENT & PLAN [name] 71 y.o. male with medical history significant for MGUS presents for a follow up visit.  Review of Mr. Willetts labs appear stable.    Given the stability of his reported health and  the stability of his labs I think we can safely follow-up the patient approximately 6 months time.  At this juncture there is no clear indication for a bone marrow biopsy though I have would have a low threshold to perform this if any worsening abnormalities were noted in his blood.  We will plan to see the patient back in approximately 6 months however if he were to have any new symptoms in the interim we recommend that he call our clinic  and we can always see him sooner.  #Normocytic Anemia, stable --HB electrophoresis confirms sickle cell trait, though he has had normal baseline Hgb before in the past.  --Mr. Archuletta was a long time blood donor, donating as frequently as he could for years. He may have developed low iron stores due to this generosity. He last donated in 2018. His counts appear stable at this time. Iron stores appear replete as of 04/30/2019.  --RTC in 6 months for further review.   #IgG Kappa Monoclonal Gammopathy of Undetermined Significance, stable  --last SPEP/SFLC showed stable labs --will repeat SPEP and SFLC 1 week prior to his next visit.   --patient will require a bone survey at least yearly. Bone met survey last performed in Sept 2022.  --continue to monitor at least q 70month.   #Leukopenia, stable --low WBC dating back to 2011, stably low around 3.5-.5.0.  --WBC 3.1 on 04/28/2021.   --05/29/2018: negative for HIV, Hep C, but had Hep B core antibody, no surface antigen or surface antibody.  --continue to monitor   #Thrombocytopenia --chronic fluctuating thrombocytopenia. There does appear to be a component of clumping --Plt 163 on 04/28/2021, though typically ranges from 100-150 --CT scan of abdomen in March 2020 showed no evidence of cirrhosis. --continue to monitor   #Suspected Arrhythmia --on ascultation of heart I thought I detected a slight arrhythmia. --ordered EKG to confirm. No abnormalities on EKG --no issue, continued to monitor  No orders of the defined types were placed in this encounter.  All questions were answered. The patient knows to call the clinic with any problems, questions or concerns.  A total of more than 30 minutes were spent face-to-face with the patient during this encounter and over half of that time was spent on counseling and coordination of care as outlined above.   JLedell Peoples MD Department of Hematology/Oncology [facility]Phone: [phone]: [phone]: [email or web address]  05/05/2021 10:28 AM

## 2021-05-09 ENCOUNTER — Other Ambulatory Visit: Payer: Self-pay

## 2021-05-09 ENCOUNTER — Encounter: Payer: Medicare Other | Attending: Nurse Practitioner | Admitting: Dietician

## 2021-05-09 DIAGNOSIS — R7303 Prediabetes: Secondary | ICD-10-CM | POA: Diagnosis not present

## 2021-05-09 NOTE — Progress Notes (Signed)
On 05/09/2021 patient completed Core Session 1 of Diabetes Prevention Program course virtually with [facility]. The following learning objectives were met by the patient during this class:   Virtual Visit via Video Note  I connected with Calvin Broad., 10-02-1950 by a video enabled application and verified that I am speaking with the correct person using two identifiers.   I discussed the limitations of evaluation and management by telemedicine and the availability of in person appointments. The patient expressed understanding and agreed to proceed.  Location: Patient: Virtual Provider: Office   Learning Objectives:  Be able to explain the purpose and benefits of the National Diabetes Prevention Program.  Be able to describe the events that will take place at every session.  Know the weight loss and physical activity goals established by the [facility] Diabetes Prevention Program.  Know their own individual weight loss and physical activity goals.  Be able to explain the important effect of self-monitoring on behavior change.   Goals:  Record food and beverage intake in "Food and Activity Tracker" over the next week.  E-mail completed "Food and Activity Tracker" to Lifestyle Coach next week before session 2. Circle the foods or beverages you think are highest in fat and calories in your food tracker. Read the labels on the food you buy, and consider using measuring cups and spoons to help you calculate the amount you eat. We will talk about measuring in more detail in the coming weeks.   Follow-Up Plan: Attend Core Session 2 next week.  E-mail completed "Food and Activity Tracker" to Lifestyle Coach next week before class.

## 2021-05-10 ENCOUNTER — Other Ambulatory Visit: Payer: Self-pay | Admitting: Nurse Practitioner

## 2021-05-10 DIAGNOSIS — N401 Enlarged prostate with lower urinary tract symptoms: Secondary | ICD-10-CM

## 2021-05-16 ENCOUNTER — Other Ambulatory Visit: Payer: Self-pay

## 2021-05-16 ENCOUNTER — Encounter: Payer: Medicare Other | Attending: Nurse Practitioner | Admitting: Dietician

## 2021-05-16 DIAGNOSIS — R7303 Prediabetes: Secondary | ICD-10-CM | POA: Insufficient documentation

## 2021-05-16 NOTE — Progress Notes (Signed)
On 05/16/2021 patient completed Core Session 2 of Diabetes Prevention Program course virtually with [facility]. The following learning objectives were met by the patient during this class:  ? ?Virtual Visit via Video Note ? ?I connected with Calvin [name]., 1950-04-29 on 05/16/21 at 10:30 AM EST by a video enabled application and verified that I am speaking with the correct person using two identifiers. ?  ?I discussed the limitations of evaluation and management by telemedicine and the availability of in person appointments. The patient expressed understanding and agreed to proceed. ? ?Location: ?Patient: Virtual ?Provider: Office ? ?Learning Objectives: ?Self-monitor their weight during the weeks following Session 2.  ?Describe the relationship between fat and calories.  ?Explain the reason for, and basic principles of, self-monitoring fat grams and calories.  ?Identify their personal fat gram goals.  ?Use the ?Fat and Calorie Counter to calculate the calories and fat grams of a given selection of foods.  ?Keep a running total of the fat grams they eat each day.  ?Calculate fat, calories, and serving sizes from nutrition labels.  ? ?Goals:  ?Weigh yourself at the same time each day, or every few days, and record your weight in your Food and Activity Tracker. ?Write down everything you eat and drink in your Food and Activity Tracker. ?Measure portions as much as you can, and start reading labels.  ?Use the ?Fat and Calorie Counter to figure out the amount of fat and calories in what you ate, and write the amount down in your Food and Activity Tracker. ?Keep a running fat gram total throughout the day. Come as close to your fat gram goal as you can.  ? ?Follow-Up Plan: ?Attend Core Session 3 next week.  ?Email completed  "Food and Activity Tracker" to Lifestyle Coach next week.  ? ?

## 2021-05-23 ENCOUNTER — Other Ambulatory Visit: Payer: Self-pay

## 2021-05-23 ENCOUNTER — Encounter (HOSPITAL_BASED_OUTPATIENT_CLINIC_OR_DEPARTMENT_OTHER): Payer: Medicare Other | Admitting: Dietician

## 2021-05-23 DIAGNOSIS — R7303 Prediabetes: Secondary | ICD-10-CM | POA: Diagnosis not present

## 2021-05-23 NOTE — Progress Notes (Signed)
On 05/23/2021 patient completed Core Session 3 of Diabetes Prevention Program course virtually with [facility]. The following learning objectives were met by the patient during this class:  ? ?Virtual Visit via Video Note ? ?I connected with [name], [birth year] on 05/23/21 at 10:30 AM EDT by a video enabled application and verified that I am speaking with the correct person using two identifiers. ? ?Location: ?Patient: Virtual ?Provider: Office ? ?Learning Objectives: ?Weigh and measure foods. ?Estimate the fat and calorie content of common foods. ?Describe three ways to eat less fat and fewer calories. ?Create a plan to eat less fat for the following week.  ? ?Goals:  ?Track weight when weighing outside of class.  ?Track food and beverages eaten each day in Food and Activity Tracker and include fat grams and calories for each.  ?Try to stay within fat gram goal.  ?Complete plan for eating less high fat foods and answer related homework questions.  ? ? ?Follow-Up Plan: ?Attend Core Session 4 next week.  ?Bring completed "Food and Activity Tracker" next week to be reviewed by Lifestyle Coach.  ? ?

## 2021-05-24 ENCOUNTER — Other Ambulatory Visit: Payer: Self-pay

## 2021-05-24 ENCOUNTER — Ambulatory Visit: Payer: Medicare Other | Admitting: Sports Medicine

## 2021-05-24 VITALS — BP 132/80 | HR 82 | Ht 67.0 in | Wt 253.0 lb

## 2021-05-24 DIAGNOSIS — M1712 Unilateral primary osteoarthritis, left knee: Secondary | ICD-10-CM | POA: Diagnosis not present

## 2021-05-24 DIAGNOSIS — M1A0620 Idiopathic chronic gout, left knee, without tophus (tophi): Secondary | ICD-10-CM | POA: Diagnosis not present

## 2021-05-24 DIAGNOSIS — M170 Bilateral primary osteoarthritis of knee: Secondary | ICD-10-CM

## 2021-05-24 NOTE — Patient Instructions (Addendum)
Good to see you  ?Continue Tylenol [phone] mg 2-3 times a day for pain relief  ?Continue Voltaren gel  ?3 week follow up  ?

## 2021-05-24 NOTE — Progress Notes (Signed)
? ? Calvin [name] ?[facility] ?[place] ?Phone: [phone] ?  ?Assessment and Plan:   ?  ?1. Chronic gout of left knee, unspecified cause ?2. Primary osteoarthritis of both knees ?3. Primary osteoarthritis of left knee ?-Chronic with exacerbation, subsequent sports medicine visit ?- 4 days of acute flare of chronic left knee pain with history of gout and severe tricompartmental DJD left knee ?- Patient elected for CSI.  Tolerated well per note below ?- Continue Tylenol 500 mg 2-3 times a day for day-to-day pain relief ?- Continue Voltaren gel as needed for pain relief ?- Congratulated patient on his weight loss and encouraged him to continue to be active ? ?Procedure: Knee Joint Injection ?Side: Left ?Indication: Acute flare of DJD with underlying gout ? ?Risks explained and consent was given verbally. The site was cleaned with alcohol prep. A needle was introduced with an anterio-lateral approach. Injection given using 34m of 1% lidocaine without epinephrine and 135mof kenalog '40mg'$ /ml. This was well tolerated and resulted in symptomatic relief.  Needle was removed, hemostasis achieved, and post injection instructions were explained.   Pt was advised to call or return to clinic if these symptoms worsen or fail to improve as anticipated.  ? ?Pertinent previous records reviewed include left knee x-ray 08/24/2020, uric acid level from 2022, uric acid level from 2021, uric acid level from 2020 ?  ?Follow Up: 3 weeks for reevaluation.  Could consider HA injection if CSI was ineffective ?  ?Subjective:   ?I, MoPincus Badderam serving as a scEducation administratoror [employer] ?Chief Complaint: Left knee pain  ? ?HPI:  ? ?05/24/21 ?Patient is a [age]ear old male complaining of left knee pain . Patient states that his knee got inflamed Sunday or Monday, doesn't remember an MOI , concerned him because he had been doing really well he walks and works out  usually has pain on the medial joint line and below the knee cap, now pain is the whole knee makes him feel like his joint may have opened up when he lifted his leg feels heavy and everything is being pulled  ? ?Relevant Historical Information: Gout, hypertension, OSA ? ?Additional pertinent review of systems negative. ? ? ?Current Outpatient Medications:  ?  acetaminophen (TYLENOL) 500 MG tablet, Take 1,000 mg by mouth every 6 (six) hours as needed., Disp: , Rfl:  ?  allopurinol (ZYLOPRIM) 300 MG tablet, Take 1 tablet (300 mg total) by mouth daily., Disp: 90 tablet, Rfl: 3 ?  aspirin 81 MG tablet, Take 81 mg by mouth daily., Disp: , Rfl:  ?  diclofenac Sodium (VOLTAREN) 1 % GEL, Apply 2 g topically 4 (four) times daily as needed., Disp: , Rfl:  ?  fenofibrate 54 MG tablet, Take 1 tablet (54 mg total) by mouth daily., Disp: 90 tablet, Rfl: 3 ?  finasteride (PROSCAR) 5 MG tablet, Take 1 tablet (5 mg total) by mouth daily., Disp: 30 tablet, Rfl: 0 ?  fluticasone (FLONASE) 50 MCG/ACT nasal spray, Place 2 sprays into both nostrils daily., Disp: , Rfl:  ?  irbesartan (AVAPRO) 300 MG tablet, Take 1 tablet (300 mg total) by mouth daily., Disp: 90 tablet, Rfl: 3 ?  loratadine (CLARITIN) 10 MG tablet, Take 10 mg by mouth daily., Disp: , Rfl:  ?  pantoprazole (PROTONIX) 40 MG tablet, TAKE 1 TABLET BY MOUTH DAILY  PLEASE CALL OFFICE TO SCHEDULE  FOLLOW UP, Disp: 90 tablet, Rfl: 1 ?  rosuvastatin (CRESTOR) 20 MG tablet, Take 1 tablet (20 mg total) by mouth daily., Disp: 90 tablet, Rfl: 3 ?  sildenafil (VIAGRA) 100 MG tablet, Take 1 tablet (100 mg total) by mouth as needed for erectile dysfunction. Yearly physical w/labs due in February must see MD for refills, Disp: 10 tablet, Rfl: 0 ?  tamsulosin (FLOMAX) 0.4 MG CAPS capsule, TAKE 1 CAPSULE BY MOUTH  DAILY, Disp: 90 capsule, Rfl: 1 ?  verapamil (CALAN-SR) 240 MG CR tablet, TAKE 1 TABLET BY MOUTH AT  BEDTIME, Disp: 90 tablet, Rfl: 3  ? ?Objective:   ?  ?Vitals:  ? 05/24/21 0936   ?BP: 132/80  ?Pulse: 82  ?SpO2: 99%  ?Weight: 253 lb (114.8 kg)  ?Height: '5\' 7"'$  (1.702 m)  ?  ?  ?Body mass index is 39.63 kg/m?.  ?  ?Physical Exam:   ? ?General:  awake, alert oriented, no acute distress nontoxic ?Skin: no suspicious lesions or rashes ?Neuro:sensation intact, no deficits, strength 5/5 with no deficits, no atrophy, normal muscle tone ?Psych: No signs of anxiety, depression or other mood disorder ? ?Left knee: ?Mild swelling ?No deformity ?Neg fluid wave, joint milking ?ROM Flex 90, Ext 15 ?TTP medial and lateral joint line ?NTTP over the quad tendon, medial fem condyle, lat fem condyle, patella, plica, patella tendon, tibial tuberostiy, fibular head, posterior fossa, pes anserine bursa, gerdy's tubercle,  ?Neg anterior and posterior drawer ?Neg lachman ?Neg sag sign ?Negative varus stress ?Negative valgus stress ?Negative McMurray ?  ? ?Gait normal  ? ? ?Electronically signed by:  ?Calvin [name] ?[facility] ?10:04 AM 05/24/21 ?

## 2021-05-25 DIAGNOSIS — G4733 Obstructive sleep apnea (adult) (pediatric): Secondary | ICD-10-CM | POA: Diagnosis not present

## 2021-05-30 ENCOUNTER — Ambulatory Visit: Payer: Medicare Other | Admitting: Nurse Practitioner

## 2021-05-30 ENCOUNTER — Other Ambulatory Visit: Payer: Self-pay

## 2021-05-30 ENCOUNTER — Encounter (HOSPITAL_BASED_OUTPATIENT_CLINIC_OR_DEPARTMENT_OTHER): Payer: Medicare Other | Admitting: Dietician

## 2021-05-30 DIAGNOSIS — R7303 Prediabetes: Secondary | ICD-10-CM

## 2021-05-30 NOTE — Progress Notes (Signed)
On 05/25/2021 patient completed Core Session 4 of Diabetes Prevention Program course virtually with [facility]. The following learning objectives were met by the patient during this class:  ? ?Virtual Visit via Video Note ? ?I connected with Calvin Broad., [birth year] on 05/30/21 at 10:30 AM EDT by a video enabled application and verified that I am speaking with the correct person using two identifiers. ? ?I discussed the limitations of evaluation and management by telemedicine and the availability of in person appointments. The patient expressed understanding and agreed to proceed. ? ?Location: ?Patient: Virtual ?Provider: Office ? ?Learning Objectives: ?Describe the MyPlate food guide and its recommendations, including how to reduce fat and calories in our diet. ?Compare and contrast MyPlate guidelines with participants? eating habits. ?List ways to replace high-fat and high-calorie foods with low-fat and low-calorie foods. ?Explain the importance of eating plenty of whole grains, vegetables, and fruits, while staying within fat gram goals. ?Explain the importance of eating foods from all groups of MyPlate and of eating a variety of foods from within each group. ?Explain why a balanced diet is beneficial to health. ? ?Goals:  ?Record weight taken outside of class.  ?Track foods and beverages eaten each day in the "Food and Activity Tracker," including calories and fat grams for each item.  ?Practice comparing what you eat with the recommendations of MyPlate using the "Rate Your Plate" handout.  ?Complete the "Rate Your Plate" handout form on at least 3 days.  ?Answer homework questions.  ? ?Follow-Up Plan: ?Attend Core Session 5 next week.  ?Email completed "Food and Activity Tracker" next week to be reviewed by Lifestyle Coach.  ? ?

## 2021-06-03 ENCOUNTER — Other Ambulatory Visit: Payer: Self-pay | Admitting: Nurse Practitioner

## 2021-06-03 DIAGNOSIS — H524 Presbyopia: Secondary | ICD-10-CM | POA: Diagnosis not present

## 2021-06-03 DIAGNOSIS — N401 Enlarged prostate with lower urinary tract symptoms: Secondary | ICD-10-CM

## 2021-06-06 ENCOUNTER — Other Ambulatory Visit: Payer: Self-pay

## 2021-06-06 ENCOUNTER — Encounter (HOSPITAL_BASED_OUTPATIENT_CLINIC_OR_DEPARTMENT_OTHER): Payer: Medicare Other | Admitting: Dietician

## 2021-06-06 DIAGNOSIS — R7303 Prediabetes: Secondary | ICD-10-CM | POA: Diagnosis not present

## 2021-06-06 NOTE — Progress Notes (Signed)
On 06/06/2021 patient completed Core Session 5 of Diabetes Prevention Program course virtually with [facility]. The following learning objectives were met by the patient during this class:  ? ?Virtual Visit via Video Note ? ?I connected with Calvin Broad., 05-07-1950 on 06/06/21 at 10:30 AM EDT by a video enabled application and verified that I am speaking with the correct person using two identifiers. ?  ?I discussed the limitations of evaluation and management by telemedicine and the availability of in person appointments. The patient expressed understanding and agreed to proceed. ? ?Location: ?Patient: Virtual ?Provider: Office ? ?Learning Objectives: ?Establish a physical activity goal. ?Explain the importance of the physical activity goal. ?Describe their current level of physical activity. ?Name ways that they are already physically active. ?Develop personal plans for physical activity for the next week.  ? ?Goals:  ?Record weight taken outside of class.  ?Track foods and beverages eaten each day in the "Food and Activity Tracker," including calories and fat grams for each item.  ?Make an Activity Plan including date, specific type of activity, and length of time you plan to be active that includes at last 60 minutes of activity for the week.  ?Track activity type, minutes you were active, and distance you reached each day in the "Food and Activity Tracker."  ? ?Follow-Up Plan: ?Attend Core Session 6 next week.  ?E-mail completed "Food and Activity Tracker" to Lifestyle Coach next week before class ? ?

## 2021-06-13 ENCOUNTER — Encounter: Payer: Medicare Other | Attending: Nurse Practitioner | Admitting: Dietician

## 2021-06-13 DIAGNOSIS — R7303 Prediabetes: Secondary | ICD-10-CM | POA: Insufficient documentation

## 2021-06-13 NOTE — Progress Notes (Signed)
On 06/13/2021 patient completed Core Session 6 of Diabetes Prevention Program course virtually with [facility]. The following learning objectives were met by the patient during this class:  ? ?Virtual Visit via Video Note ? ?I connected with [name], May 20, 1950 on 06/13/21 at 10:30 AM EDT by a video enabled application and verified that I am speaking with the correct person using two identifiers. ?  ?I discussed the limitations of evaluation and management by telemedicine and the availability of in person appointments. The patient expressed understanding and agreed to proceed. ? ? ?Location: ?Patient: Virtual ?Provider: Office ? ?Learning Objectives: ?Graph their daily physical activity.  ?Describe two ways of finding the time to be active.  ?Define ?lifestyle activity.?  ?Describe how to prevent injury.  ?Develop an activity plan for the coming week.  ? ?Goals:  ?Record weight taken outside of class.  ?Track foods and beverages eaten each day in the "Food and Activity Tracker," including calories and fat grams for each item.   ?Track activity type, minutes you were active, and distance you reached each day in the "Food and Activity Tracker."  ?Set aside one 20 to 30-minute block of time every day or find two or more periods of 10 to15 minutes each for physical activity.  ?Warm up, cool down, and stretch. ?Make a Physical Activities Plan for the Week.  ? ?Follow-Up Plan: ?Attend Core Session 7 next week.  ?E-mail completed "Food and Activity Tracker" to Lifestyle Coach next week before class ? ?

## 2021-06-13 NOTE — Progress Notes (Signed)
? ? Calvin [name] D.Merril Abbe ?[facility] ?[place] ?Phone: [phone] ?  ?Assessment and Plan:   ?  ?1. Primary osteoarthritis of both knees ?2. Chronic pain of left knee ?-Chronic with exacerbation, subsequent visit ?- Significant improvement in left knee pain after CSI at office visit on 05/24/2021.  Could repeat CSI 08/24/2021 allowing for 3 months in between injections.  If patient does not receive at least 3 months of relief, could consider HA injection ?- Continue Tylenol 500 mg 2-3 times a day for day-to-day pain relief ?- Continue Voltaren gel as needed for pain relief ? ?  ?Pertinent previous records reviewed include none ?  ?Follow Up: as needed. Could consider repeat CSI after 08/24/21 if needed or HA if pain returns sooner  ? ?  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 ?Chief Complaint: left knee pain  ? ?HPI:  ?  ?05/24/21 ?Patient is a 71 year old male complaining of left knee pain . Patient states that his knee got inflamed Sunday or Monday, doesn't remember an MOI , concerned him because he had been doing really well he walks and works out usually has pain on the medial joint line and below the knee cap, now pain is the whole knee makes him feel like his joint may have opened up when he lifted his leg feels heavy and everything is being pulled  ? ?06/14/2021 ?Patient states that he's good doing alright, pain is moderate , pain from last time has vanished he's as good as he could be concerned about rarely has used the cream and hasn't had to use the tylenol  ? ?  ?Relevant Historical Information: Gout, hypertension, OSA ? ? ?Additional pertinent review of systems negative. ? ? ?Current Outpatient Medications:  ?  acetaminophen (TYLENOL) 500 MG tablet, Take 1,000 mg by mouth every 6 (six) hours as needed., Disp: , Rfl:  ?  allopurinol (ZYLOPRIM) 300 MG tablet, Take 1 tablet (300 mg total) by mouth daily.,  Disp: 90 tablet, Rfl: 3 ?  aspirin 81 MG tablet, Take 81 mg by mouth daily., Disp: , Rfl:  ?  diclofenac Sodium (VOLTAREN) 1 % GEL, Apply 2 g topically 4 (four) times daily as needed., Disp: , Rfl:  ?  fenofibrate 54 MG tablet, Take 1 tablet (54 mg total) by mouth daily., Disp: 90 tablet, Rfl: 3 ?  finasteride (PROSCAR) 5 MG tablet, TAKE 1 TABLET(5 MG) BY MOUTH DAILY, Disp: 30 tablet, Rfl: 0 ?  fluticasone (FLONASE) 50 MCG/ACT nasal spray, Place 2 sprays into both nostrils daily., Disp: , Rfl:  ?  irbesartan (AVAPRO) 300 MG tablet, Take 1 tablet (300 mg total) by mouth daily., Disp: 90 tablet, Rfl: 3 ?  loratadine (CLARITIN) 10 MG tablet, Take 10 mg by mouth daily., Disp: , Rfl:  ?  pantoprazole (PROTONIX) 40 MG tablet, TAKE 1 TABLET BY MOUTH DAILY  PLEASE CALL OFFICE TO SCHEDULE  FOLLOW UP, Disp: 90 tablet, Rfl: 1 ?  rosuvastatin (CRESTOR) 20 MG tablet, Take 1 tablet (20 mg total) by mouth daily., Disp: 90 tablet, Rfl: 3 ?  sildenafil (VIAGRA) 100 MG tablet, Take 1 tablet (100 mg total) by mouth as needed for erectile dysfunction. Yearly physical w/labs due in February must see MD for refills, Disp: 10 tablet, Rfl: 0 ?  tamsulosin (FLOMAX) 0.4 MG CAPS capsule, TAKE 1 CAPSULE BY MOUTH  DAILY, Disp: 90 capsule, Rfl: 1 ?  verapamil (CALAN-SR) 240 MG CR tablet, TAKE 1 TABLET BY MOUTH AT  BEDTIME, Disp: 90 tablet, Rfl: 3  ? ?Objective:   ?  ?Vitals:  ? 06/14/21 0943  ?BP: 130/80  ?Pulse: 66  ?SpO2: 97%  ?Weight: 246 lb (111.6 kg)  ?Height: '5\' 7"'$  (1.702 m)  ?  ?  ?Body mass index is 38.53 kg/m?.  ?  ?Physical Exam:   ? ?General:  awake, alert oriented, no acute distress nontoxic ?Skin: no suspicious lesions or rashes ?Neuro:sensation intact, no deficits, strength 5/5 with no deficits, no atrophy, normal muscle tone ?Psych: No signs of anxiety, depression or other mood disorder ?  ?Left knee: ?Mild swelling ?No deformity ?Neg fluid wave, joint milking ?ROM Flex 90, Ext 15 ?TTP mildly medial and lateral joint line ?NTTP  over the quad tendon, medial fem condyle, lat fem condyle, patella, plica, patella tendon, tibial tuberostiy, fibular head, posterior fossa, pes anserine bursa, gerdy's tubercle,  ?Neg anterior and posterior drawer ?Neg lachman ?Neg sag sign ?Negative varus stress ?Negative valgus stress ?Negative McMurray ?  ?  ?Gait normal  ? ? ?Electronically signed by:  ?Calvin [name] D.Merril Abbe ?[facility] ?10:06 AM 06/14/21 ?

## 2021-06-14 ENCOUNTER — Ambulatory Visit: Payer: Medicare Other | Admitting: Sports Medicine

## 2021-06-14 VITALS — BP 130/80 | HR 66 | Ht 67.0 in | Wt 246.0 lb

## 2021-06-14 DIAGNOSIS — G8929 Other chronic pain: Secondary | ICD-10-CM

## 2021-06-14 DIAGNOSIS — M25562 Pain in left knee: Secondary | ICD-10-CM | POA: Diagnosis not present

## 2021-06-14 DIAGNOSIS — M170 Bilateral primary osteoarthritis of knee: Secondary | ICD-10-CM | POA: Diagnosis not present

## 2021-06-14 NOTE — Patient Instructions (Addendum)
Good to see you  ?Recommend using elliptical, stationary bike, and water aerobics  ?Tylenol [phone] mg 2-3 times a day for pain relief  ?Voltaren gel as needed for pain relief ?As needed follow up  ? ?

## 2021-06-16 ENCOUNTER — Ambulatory Visit: Payer: Medicare Other

## 2021-06-27 ENCOUNTER — Encounter (HOSPITAL_BASED_OUTPATIENT_CLINIC_OR_DEPARTMENT_OTHER): Payer: Medicare Other | Admitting: Dietician

## 2021-06-27 DIAGNOSIS — R7303 Prediabetes: Secondary | ICD-10-CM | POA: Diagnosis not present

## 2021-06-27 NOTE — Progress Notes (Signed)
On 06/27/2021 patient completed Core Session 7 of Diabetes Prevention Program course virtually with [facility]. The following learning objectives were met by the patient during this class:  ? ?Virtual Visit via Video Note ? ?I connected with Calvin [name]., 1952/07/07on 06/27/21 at 10:30 AM EDT by a video enabled application and verified that I am speaking with the correct person using two identifiers. ? ?Location: ?Patient: Virtual ?Provider: Office ? ?Learning Objectives: ?Define calorie balance. ?Explain how healthy eating and being active are related in terms of calorie balance.  ?Describe the relationship between calorie balance and weight loss.  ?Describe his or her progress as it relates to calorie balance.  ?Develop an activity plan for the coming week.  ? ?Goals:  ?Record weight taken outside of class.  ?Track foods and beverages eaten each day in the "Food and Activity Tracker," including calories and fat grams for each item.   ?Track activity type, minutes you were active, and distance you reached each day in the "Food and Activity Tracker."  ?Set aside one 20 to 30-minute block of time every day or find two or more periods of 10 to15 minutes each for physical activity.  ?Make a Physical Activities Plan for the Week.  ?Make active lifestyle choices all through the day  ?Stay at or go slightly over activity goal.  ? ?Follow-Up Plan: ?Attend Core Session 8 next week.  ?E-mail completed "Food and Activity Tracker" to Lifestyle Coach next week before class ? ?

## 2021-07-04 ENCOUNTER — Encounter (HOSPITAL_BASED_OUTPATIENT_CLINIC_OR_DEPARTMENT_OTHER): Payer: Medicare Other | Admitting: Dietician

## 2021-07-04 DIAGNOSIS — R7303 Prediabetes: Secondary | ICD-10-CM

## 2021-07-04 NOTE — Progress Notes (Signed)
On 07/04/2021 patient completed Core Session 8 of Diabetes Prevention Program course virtually with [facility]. The following learning objectives were met by the patient during this class:  ? ?Virtual Visit via Video Note ? ?I connected with Calvin [name]., 1951/03/05 by a video enabled application and verified that I am speaking with the correct person using two identifiers. ? ?Location: ?Patient: Virtual ?Provider: Office ? ?Learning Objectives: ?Recognize positive and negative food and activity cues.  ?Change negative food and activity cues to positive cues.  ?Add positive cues for activity and eliminate cues for inactivity.  ?Develop a plan for removing one problem food cue for the coming week.  ? ?Goals:  ?Record weight taken outside of class.  ?Track foods and beverages eaten each day in the "Food and Activity Tracker," including calories and fat grams for each item.   ?Track activity type, minutes you were active, and distance you reached each day in the "Food and Activity Tracker."  ?Set aside one 20 to 30-minute block of time every day or find two or more periods of 10 to15 minutes each for physical activity.  ?Remove one problem food cue.  ?Add one positive cue for being more active. ? ?Follow-Up Plan: ?Attend Core Session 9 next week.  ?Email completed "Food and Activity Tracker" next week to be reviewed by Lifestyle Coach. ? ?

## 2021-07-11 ENCOUNTER — Encounter: Payer: Medicare Other | Attending: Nurse Practitioner | Admitting: Dietician

## 2021-07-11 DIAGNOSIS — R7303 Prediabetes: Secondary | ICD-10-CM

## 2021-07-11 NOTE — Progress Notes (Addendum)
On 07/11/2021 patient completed Core Session 9 of Diabetes Prevention Program course virtually with [facility]. The following learning objectives were met by the patient during this class:  ? ?Virtual Visit via Video Note ? ?I connected with [name] [birth year] by a video enabled application and verified that I am speaking with the correct person using two identifiers. ? ?Location: ?Patient: Virtual ?Provider: Office ? ?Learning Objectives: ?List and describe five steps to problem solving.  ?Apply the five problem solving steps to resolve a problem he or she has with eating less fat and fewer calories or being more active.  ? ?Goals:  ?Record weight taken outside of class.  ?Track foods and beverages eaten each day in the "Food and Activity Tracker," including calories and fat grams for each item.   ?Track activity type, minutes you were active, and distance you reached each day in the "Food and Activity Tracker."  ?Set aside one 20 to 30-minute block of time every day or find two or more periods of 10 to15 minutes each for physical activity.  ?Use problem solving action plan created during session to problem solve.  ? ?Follow-Up Plan: ?Attend Core Session 10 next week.  ?Email completed "Food and Activity Tracker" next week to be reviewed by Lifestyle Coach. ?Email menus from favorite restaurants to next session for future discussion.   ?

## 2021-07-17 ENCOUNTER — Encounter: Payer: Self-pay | Admitting: Nurse Practitioner

## 2021-07-18 ENCOUNTER — Encounter: Payer: Medicare Other | Admitting: Dietician

## 2021-07-18 DIAGNOSIS — R7303 Prediabetes: Secondary | ICD-10-CM

## 2021-07-19 ENCOUNTER — Encounter: Payer: Self-pay | Admitting: Dietician

## 2021-07-19 NOTE — Progress Notes (Signed)
On 07/18/2021 patient attended a virtual grocery store tour session as part of the Diabetes Prevention Program with [facility]. ? ?Virtual Visit via Video Note ? ?I connected with Calvin [name]. by a video enabled application and verified that I am speaking with the correct person using two identifiers. ? ?Location: ?Patient: home ?Provider: office ? ? Learning Objectives: ?Develop a plan for our grocery shopping experience ?Putting together a list ?How to navigate the grocery store ?Identify 4 main sections of the grocery store ?Produce ?Meat/Poultry/Fish ?Dairy  ?Inside Aisles ?Consider tips for shopping in each of these four sections ?Reflect on our own shopping habits ?Create a new goal for our next grocery shopping experience ?Engage in a group discussion ? ?Goals:  ?Record weight taken outside of class.  ?Track foods and beverages eaten each day in the "Food and Activity Tracker," including calories and fat grams for each item.  ?Create one new goal for the next grocery shopping experience based on the information provided today ? ?Follow-Up Plan: ?Attend next session.  ?Email completed "Food and Activity Tracker" before next session to be reviewed by Lifestyle Coach.   ?

## 2021-07-24 ENCOUNTER — Other Ambulatory Visit: Payer: Self-pay | Admitting: Nurse Practitioner

## 2021-07-24 DIAGNOSIS — N401 Enlarged prostate with lower urinary tract symptoms: Secondary | ICD-10-CM

## 2021-07-25 ENCOUNTER — Encounter: Payer: Self-pay | Admitting: Dietician

## 2021-07-25 ENCOUNTER — Encounter: Payer: Medicare Other | Admitting: Dietician

## 2021-07-25 DIAGNOSIS — R7303 Prediabetes: Secondary | ICD-10-CM

## 2021-07-25 NOTE — Progress Notes (Signed)
On 07/25/2021 patient completed a post core session of the Diabetes Prevention Program course virtually with [facility]. By the end of this session patients are able to complete the following objectives:  ? ?Virtual Visit via Video Note ? ?I connected with [name] by a video enabled application and verified that I am speaking with the correct person using two identifiers. ?  ?I discussed the limitations of evaluation and management by telemedicine and the availability of in person appointments. The patient expressed understanding and agreed to proceed. ? ?Location: ?Patient: home (virtual) ?Provider: office ? ?Learning Objectives: ?Identify which foods contain carbohydrates.  ?List functions for carbohydrates on the body.  ?Describe the relationship between carbohydrate intake and blood sugar.  ?Create balanced snack choices.  ? ?Goals:  ?Record weight taken outside of class.  ?Track foods and beverages eaten each day in the "Food and Activity Tracker," including calories and fat grams for each item.   ?Track activity type, minutes you were active, and distance you reached each day in the "Food and Activity Tracker."  ? ?Follow-Up Plan: ?Attend next session.  ?Email completed "Food and Activity Trackers" before next session to be reviewed by Lifestyle Coach.  ?

## 2021-07-26 NOTE — Telephone Encounter (Signed)
Chart supports Rx ?Last seen 04/2021 ?Next OV 07/2021 ?

## 2021-07-28 NOTE — Progress Notes (Signed)
Calvin [name] D.[place] Phone: [phone]   Assessment and Plan:     1. Left foot pain 2. 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  My interpretation: No acute fracture or dislocation.  Calcific concentrations dispersed through Achilles tendon.  Small bone spur over plantar surface of calcaneus.  No acute fracture or dislocation   Pertinent previous records reviewed include none   Follow Up: As needed if no improvement or worsening of symptoms   Subjective:   I, Calvin [name], am serving as a Education administrator for Doctor Glennon Mac  Chief Complaint: left heel pain   HPI:   07/29/2021 Patient is a 71 year old male complaining of left heel pain. Patient states that he  has been walking and broke out new  sandals and walked in them the next day the back of his heel started bothering him Tuesday it was  the first day he put them on , the pain has gotten better since feels like new today , is going to [place] next week for two weeks   Relevant Historical Information: Hypertension, left knee OA  Additional pertinent review of systems negative.   Current Outpatient Medications:    acetaminophen (TYLENOL) 500 MG tablet, Take 1,000 mg by mouth every 6 (six) hours as needed., Disp: , Rfl:    allopurinol (ZYLOPRIM) 300 MG tablet, Take 1 tablet (300 mg total) by mouth daily., Disp: 90 tablet, Rfl: 3   aspirin 81 MG tablet, Take 81 mg by mouth daily., Disp: , Rfl:    diclofenac Sodium (VOLTAREN) 1 % GEL, Apply 2 g topically 4 (four) times daily as needed., Disp: , Rfl:    fenofibrate 54 MG tablet, Take 1 tablet (54 mg total) by mouth daily.,  Disp: 90 tablet, Rfl: 3   finasteride (PROSCAR) 5 MG tablet, TAKE 1 TABLET BY MOUTH  DAILY, Disp: 90 tablet, Rfl: 3   fluticasone (FLONASE) 50 MCG/ACT nasal spray, Place 2 sprays into both nostrils daily., Disp: , Rfl:    irbesartan (AVAPRO) 300 MG tablet, Take 1 tablet (300 mg total) by mouth daily., Disp: 90 tablet, Rfl: 3   loratadine (CLARITIN) 10 MG tablet, Take 10 mg by mouth daily., Disp: , Rfl:    pantoprazole (PROTONIX) 40 MG tablet, TAKE 1 TABLET BY MOUTH DAILY  PLEASE CALL OFFICE TO SCHEDULE  FOLLOW UP, Disp: 90 tablet, Rfl: 1   rosuvastatin (CRESTOR) 20 MG tablet, Take 1 tablet (20 mg total) by mouth daily., Disp: 90 tablet, Rfl: 3   sildenafil (VIAGRA) 100 MG tablet, Take 1 tablet (100 mg total) by mouth as needed for erectile dysfunction. Yearly physical w/labs due in February must see MD for refills, Disp: 10 tablet, Rfl: 0   tamsulosin (FLOMAX) 0.4 MG CAPS capsule, TAKE 1 CAPSULE BY MOUTH  DAILY, Disp: 90 capsule, Rfl: 1   verapamil (CALAN-SR) 240 MG CR tablet, TAKE 1 TABLET BY MOUTH AT  BEDTIME, Disp: 90 tablet, Rfl: 3   Objective:     Vitals:   07/29/21 1035  BP: 130/84  Pulse: 68  SpO2: 99%  Weight: 244 lb (110.7 kg)  Height: '5\' 7"'$  (1.702 m)      Body mass index is  38.22 kg/m.    Physical Exam:    Gen: Appears well, nad, nontoxic and pleasant Psych: Alert and oriented, appropriate mood and affect Neuro: sensation intact, strength is 5/5 with df/pf/inv/ev, muscle tone wnl Skin: no susupicious lesions or rashes  Left foot/ankle: no deformity, no swelling or effusion TTP minimally Achilles insertion at calcaneus NTTP over fibular head, lat mal, medial mal, achilles, navicular, base of 5th, ATFL, CFL, deltoid, calcaneous or midfoot ROM DF 30, PF 45, inv/ev intact Negative ant drawer, talar tilt, rotation test, squeeze test. Neg thompson No pain with resisted inversion or eversion  Negative calcaneal compression test  Electronically signed by:  Calvin [name] D.[facility] 11:06 AM 07/29/21

## 2021-07-29 ENCOUNTER — Ambulatory Visit: Payer: Medicare Other | Admitting: Sports Medicine

## 2021-07-29 ENCOUNTER — Ambulatory Visit (INDEPENDENT_AMBULATORY_CARE_PROVIDER_SITE_OTHER): Payer: Medicare Other

## 2021-07-29 VITALS — BP 130/84 | HR 68 | Ht 67.0 in | Wt 244.0 lb

## 2021-07-29 DIAGNOSIS — M6528 Calcific tendinitis, other site: Secondary | ICD-10-CM

## 2021-07-29 DIAGNOSIS — M79672 Pain in left foot: Secondary | ICD-10-CM

## 2021-07-29 NOTE — Patient Instructions (Addendum)
Good to see you  Calf Hep Voltaren gel over heel  As needed follow up

## 2021-08-01 ENCOUNTER — Encounter: Payer: Self-pay | Admitting: Nurse Practitioner

## 2021-08-01 ENCOUNTER — Encounter: Payer: Self-pay | Admitting: Dietician

## 2021-08-01 ENCOUNTER — Ambulatory Visit: Payer: Medicare Other | Admitting: Nurse Practitioner

## 2021-08-01 ENCOUNTER — Encounter: Payer: Medicare Other | Admitting: Dietician

## 2021-08-01 VITALS — BP 138/80 | HR 63 | Temp 96.2°F | Ht 67.0 in | Wt 244.6 lb

## 2021-08-01 DIAGNOSIS — I10 Essential (primary) hypertension: Secondary | ICD-10-CM

## 2021-08-01 DIAGNOSIS — Z6838 Body mass index (BMI) 38.0-38.9, adult: Secondary | ICD-10-CM

## 2021-08-01 DIAGNOSIS — R739 Hyperglycemia, unspecified: Secondary | ICD-10-CM | POA: Diagnosis not present

## 2021-08-01 DIAGNOSIS — R7303 Prediabetes: Secondary | ICD-10-CM

## 2021-08-01 LAB — POCT GLYCOSYLATED HEMOGLOBIN (HGB A1C): Hemoglobin A1C: 5.7 % — AB (ref 4.0–5.6)

## 2021-08-01 NOTE — Assessment & Plan Note (Signed)
Has made additional diet changes after appt with nutritionist. Has also incorporated daily exercise: cardio and weight training. Lost 2lbs in last 78monthWt Readings from Last 3 Encounters:  08/01/21 244 lb 9.6 oz (110.9 kg)  07/29/21 244 lb (110.7 kg)  06/14/21 246 lb (111.6 kg)   Encouraged to continue lifestyle modifications F/up in 3-64month

## 2021-08-01 NOTE — Patient Instructions (Addendum)
Repeat hgbA1c at 5.7% Stable Continue heart healthy diet and exercise as discussed. F/up in 52month Have a safe trip. Keep up the great work.

## 2021-08-01 NOTE — Progress Notes (Signed)
Established Patient Visit  Patient: Calvin [name].   DOB: 06-Jan-1951   70 y.o. Male  MRN: [id number] Visit Date: 08/01/2021  Subjective:    Chief Complaint  Patient presents with   Office Visit    3 month f/u for DM/HTH & Hyperlipidemia Pt doesn't check blood sugar or BP at home. No Concern today     HPI Hyperglycemia Repeat hgbA1c at 5.7% Stable Continue heart healthy diet and exercise as discussed. F/up in 75month  Obesity Has made additional diet changes after appt with nutritionist. Has also incorporated daily exercise: cardio and weight training. Lost 2lbs in last 158montht Readings from Last 3 Encounters:  08/01/21 244 lb 9.6 oz (110.9 kg)  07/29/21 244 lb (110.7 kg)  06/14/21 246 lb (111.6 kg)   Encouraged to continue lifestyle modifications F/up in 3-102m61month BP Readings from Last 3 Encounters:  08/01/21 138/80  07/29/21 130/84  06/14/21 130/80    Reviewed medical, surgical, and social history today  Medications: Outpatient Medications Prior to Visit  Medication Sig   acetaminophen (TYLENOL) 500 MG tablet Take 1,000 mg by mouth every 6 (six) hours as needed.   allopurinol (ZYLOPRIM) 300 MG tablet Take 1 tablet (300 mg total) by mouth daily.   aspirin 81 MG tablet Take 81 mg by mouth daily.   diclofenac Sodium (VOLTAREN) 1 % GEL Apply 2 g topically 4 (four) times daily as needed.   fenofibrate 54 MG tablet Take 1 tablet (54 mg total) by mouth daily.   finasteride (PROSCAR) 5 MG tablet TAKE 1 TABLET BY MOUTH  DAILY   fluticasone (FLONASE) 50 MCG/ACT nasal spray Place 2 sprays into both nostrils daily.   irbesartan (AVAPRO) 300 MG tablet Take 1 tablet (300 mg total) by mouth daily.   loratadine (CLARITIN) 10 MG tablet Take 10 mg by mouth daily.   pantoprazole (PROTONIX) 40 MG tablet TAKE 1 TABLET BY MOUTH DAILY  PLEASE CALL OFFICE TO SCHEDULE  FOLLOW UP   rosuvastatin (CRESTOR) 20 MG tablet Take 1 tablet (20 mg total) by mouth daily.    sildenafil (VIAGRA) 100 MG tablet Take 1 tablet (100 mg total) by mouth as needed for erectile dysfunction. Yearly physical w/labs due in February must see MD for refills   tamsulosin (FLOMAX) 0.4 MG CAPS capsule TAKE 1 CAPSULE BY MOUTH  DAILY   verapamil (CALAN-SR) 240 MG CR tablet TAKE 1 TABLET BY MOUTH AT  BEDTIME   No facility-administered medications prior to visit.   Reviewed past medical and social history.   ROS per HPI above      Objective:  BP 138/80 (BP Location: Left Arm, Patient Position: Sitting, Cuff Size: Normal)   Pulse 63   Temp (!) 96.2 F (35.7 C) (Temporal)   Ht '5\' 7"'$  (1.702 m)   Wt 244 lb 9.6 oz (110.9 kg)   SpO2 96%   BMI 38.31 kg/m      Physical Exam Vitals reviewed.  Cardiovascular:     Rate and Rhythm: Normal rate.     Pulses: Normal pulses.  Pulmonary:     Effort: Pulmonary effort is normal.  Neurological:     Mental Status: He is alert.    Results for orders placed or performed in visit on 08/01/21  POCT glycosylated hemoglobin (Hb A1C)  Result Value Ref Range   Hemoglobin A1C 5.7 (A) 4.0 - 5.6 %  Assessment & Plan:    Problem List Items Addressed This Visit       Cardiovascular and Mediastinum   Essential hypertension     Other   Hyperglycemia - Primary    Repeat hgbA1c at 5.7% Stable Continue heart healthy diet and exercise as discussed. F/up in 67month       Relevant Orders   POCT glycosylated hemoglobin (Hb A1C) (Completed)   Obesity    Has made additional diet changes after appt with nutritionist. Has also incorporated daily exercise: cardio and weight training. Lost 2lbs in last 1263montht Readings from Last 3 Encounters:  08/01/21 244 lb 9.6 oz (110.9 kg)  07/29/21 244 lb (110.7 kg)  06/14/21 246 lb (111.6 kg)   Encouraged to continue lifestyle modifications F/up in 3-63m60month    Return in about 6 months (around 02/01/2022) for  HTN, hyperlipidemia, prediabetes (fasting).     ChaWilfred LacyP

## 2021-08-01 NOTE — Assessment & Plan Note (Signed)
Repeat hgbA1c at 5.7% Stable Continue heart healthy diet and exercise as discussed. F/up in 47month

## 2021-08-01 NOTE — Progress Notes (Unsigned)
Waiting for smart phrase

## 2021-08-15 ENCOUNTER — Encounter: Payer: Medicare Other | Attending: Nurse Practitioner | Admitting: Dietician

## 2021-08-15 DIAGNOSIS — R7303 Prediabetes: Secondary | ICD-10-CM | POA: Insufficient documentation

## 2021-08-15 NOTE — Progress Notes (Unsigned)
Waiting on smart phrase

## 2021-08-16 ENCOUNTER — Encounter: Payer: Self-pay | Admitting: Dietician

## 2021-08-22 ENCOUNTER — Encounter: Payer: Medicare Other | Admitting: Dietician

## 2021-08-22 ENCOUNTER — Encounter: Payer: Self-pay | Admitting: Dietician

## 2021-08-22 DIAGNOSIS — R7303 Prediabetes: Secondary | ICD-10-CM

## 2021-08-22 NOTE — Progress Notes (Signed)
On 08/22/2021 completed a post core session of the Diabetes Prevention Program with [facility].    Virtual Visit via Video Note   I connected with Calvin [name]. by a video enabled application and verified that I am speaking with the correct person using two identifiers.   Location: Patient: home (virtual) Provider: office  By the end of this session patients are able to complete the following objectives:   Learning Objectives: List indoor physical activity options.  Identify any barriers to being active and brainstorm how to overcome barriers.  Describe short and long-term health benefits of physical activity.   Goals:  Record weight taken outside of class.  Track foods and beverages eaten each day in the "Food and Activity Tracker," including calories and fat grams for each item.   Track activity type, minutes you were active, and distance you reached each day in the "Food and Activity Tracker."   Follow-Up Plan: Attend next session.  Bring completed "Food and Activity Trackers" to next session to be reviewed by Lifestyle Coach.

## 2021-08-23 ENCOUNTER — Encounter: Payer: Self-pay | Admitting: Nurse Practitioner

## 2021-08-23 ENCOUNTER — Ambulatory Visit (INDEPENDENT_AMBULATORY_CARE_PROVIDER_SITE_OTHER): Payer: Medicare Other | Admitting: Nurse Practitioner

## 2021-08-23 VITALS — BP 138/80 | HR 57 | Temp 97.0°F | Ht 67.0 in | Wt 244.0 lb

## 2021-08-23 DIAGNOSIS — M898X9 Other specified disorders of bone, unspecified site: Secondary | ICD-10-CM | POA: Diagnosis not present

## 2021-08-23 NOTE — Patient Instructions (Signed)
Call office if any redness, swelling, limited elbow range of motion, warm to touch.
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
## 2021-08-24 DIAGNOSIS — G4733 Obstructive sleep apnea (adult) (pediatric): Secondary | ICD-10-CM | POA: Diagnosis not present

## 2021-08-29 ENCOUNTER — Encounter: Payer: Self-pay | Admitting: Dietician

## 2021-08-29 ENCOUNTER — Other Ambulatory Visit: Payer: Self-pay | Admitting: Internal Medicine

## 2021-08-29 ENCOUNTER — Encounter: Payer: Medicare Other | Attending: Nurse Practitioner | Admitting: Dietician

## 2021-08-29 DIAGNOSIS — R7303 Prediabetes: Secondary | ICD-10-CM | POA: Insufficient documentation

## 2021-08-29 NOTE — Progress Notes (Signed)
On 08/29/2021 patient completed a post core session of the Diabetes Prevention Program course virtually with [facility]. By the end of this session patients are able to complete the following objectives:   Virtual Visit via Video Note  I connected with Calvin [name]. by a video enabled application and verified that I am speaking with the correct person using two identifiers.  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  Define the difference between HDL and LDL cholesterol List ways to reduce risk for heart disease.   Goals:  Record weight taken outside of class.  Track foods and beverages eaten each day in the "Food and Activity Tracker," including calories and fat grams for each item.   Track activity type, minutes you were active, and distance you reached each day in the "Food and Activity Tracker."   Follow-Up Plan: Attend next session.  Email completed "Food and Activity Trackers" before next session to be reviewed by Lifestyle Coach.

## 2021-09-05 ENCOUNTER — Encounter: Payer: Medicare Other | Attending: Nurse Practitioner | Admitting: Registered"

## 2021-09-05 DIAGNOSIS — R7303 Prediabetes: Secondary | ICD-10-CM | POA: Insufficient documentation

## 2021-09-16 ENCOUNTER — Encounter: Payer: Self-pay | Admitting: Registered"

## 2021-09-16 NOTE — Progress Notes (Signed)
On 09/05/21 patient completed Core Session 10 of Diabetes Prevention Program course virtually with [facility]. The following learning objectives were met by the patient during this class:   Virtual Visit via Video Note  I connected with Calvin [name], Calvin [name]. by a video enabled application and verified that I am speaking with the correct person using two identifiers.  Location: Patient: Home.  Provider: Office.   Learning Objectives: List and describe the four keys for healthy eating out.  Give examples of how to apply these keys at the type of restaurants that the participants go to regularly.  Make an appropriate meal selection from a restaurant menu.  Demonstrate how to ask for a substitute item using assertive language and a polite tone of voice.    Goals:  Record weight taken outside of class.  Track foods and beverages eaten each day in the "Food and Activity Tracker," including calories and fat grams for each item.   Track activity type, minutes you were active, and distance you reached each day in the "Food and Activity Tracker."  Set aside one 20 to 30-minute block of time every day or find two or more periods of 10 to15 minutes each for physical activity.  Utilize positive action plan and complete questions on "To Do List."   Follow-Up Plan: Attend Core Session 11.  Email completed "Food and Activity Tracker" to be reviewed by Lifestyle Coach.

## 2021-09-19 ENCOUNTER — Encounter: Payer: Medicare Other | Attending: Nurse Practitioner | Admitting: Registered"

## 2021-09-19 DIAGNOSIS — R7303 Prediabetes: Secondary | ICD-10-CM | POA: Insufficient documentation

## 2021-09-21 ENCOUNTER — Encounter: Payer: Self-pay | Admitting: Registered"

## 2021-09-21 NOTE — Progress Notes (Signed)
On 09/19/21 patient completed Session 11 of Diabetes Prevention Program course virtually with [facility]. By the end of this session patients are able to complete the following objectives:   Virtual Visit via Video Note  I connected with Loney Laurence. by a video enabled application and verified that I am speaking with the correct person using two identifiers.  Location: Patient: Home.  Provider: Office.   Learning Objectives: Give examples of negative thoughts that could prevent them from meeting their goals of losing weight and being more physically active.  Describe how to stop negative thoughts and talk back to them with positive thoughts.  Practice 1) stopping negative thoughts and 2) talking back to negative thoughts with positive ones.    Goals:  Record weight taken outside of class.  Track foods and beverages eaten each day in the "Food and Activity Tracker," including calories and fat grams for each item.   Track activity type, minutes you were active, and distance you reached each day in the "Food and Activity Tracker."  If you have any negative thoughts-write them in your Food and Activity Trackers, along with how you talked back to them. Practice stopping negative thoughts and talking back to them with positive thoughts.   Follow-Up Plan: Attend Core Session 12 next week.  Email completed "Food and Activity Tracker" before next week to be reviewed by Lifestyle Coach.

## 2021-09-26 ENCOUNTER — Encounter: Payer: Medicare Other | Admitting: Registered"

## 2021-10-03 ENCOUNTER — Ambulatory Visit (HOSPITAL_BASED_OUTPATIENT_CLINIC_OR_DEPARTMENT_OTHER): Payer: Medicare Other | Admitting: Registered"

## 2021-10-03 ENCOUNTER — Encounter: Payer: Self-pay | Admitting: Registered"

## 2021-10-03 DIAGNOSIS — R7303 Prediabetes: Secondary | ICD-10-CM | POA: Diagnosis not present

## 2021-10-03 NOTE — Progress Notes (Signed)
On 10/03/21 patient completed the Core Session 13 of Diabetes Prevention Program course virtually with [facility]. By the end of this session patients are able to complete the following objectives:   Virtual Visit via Video Note  I connected with Loney Laurence. on 10/03/21 at 10:30 AM EDT by a video enabled application and verified that I am speaking with the correct person using two identifiers.  Location: Patient: Home.  Provider: Office.   Learning Objectives: Describe ways to add interest and variety to their activity plans. Define ?aerobic fitness. Explain the four F.I.T.T. principles (frequency, intensity, time, and type of activity) and how they relate to aerobic fitness.   Goals:  Record weight taken outside of class.  Track foods and beverages eaten each day in the "Food and Activity Tracker," including calories and fat grams for each item.   Track activity type, minutes you were active, and distance you reached each day in the "Food and Activity Tracker."  Do your best to reach activity goal for the week. Use one of the F.I.T.T. principles to jump start workouts. Document activity level on the "To Do Next Week" handout.  Follow-Up Plan: Attend Core Session 14 next week.  Email completed "Food and Activity Tracker" before next week to be reviewed by Lifestyle Coach.

## 2021-10-10 ENCOUNTER — Encounter: Payer: Medicare Other | Attending: Nurse Practitioner

## 2021-10-17 ENCOUNTER — Encounter: Payer: Self-pay | Admitting: Registered"

## 2021-10-17 ENCOUNTER — Encounter: Payer: Medicare Other | Attending: Nurse Practitioner | Admitting: Registered"

## 2021-10-17 DIAGNOSIS — R7303 Prediabetes: Secondary | ICD-10-CM | POA: Diagnosis not present

## 2021-10-17 NOTE — Progress Notes (Signed)
On 10/17/21 patient completed Core Session 14 of Diabetes Prevention Program course virtually with [facility]. By the end of this session patients are able to complete the following objectives:   Virtual Visit via Video Note  I connected with Calvin [name], Calvin [name]. on 10/17/21 at 10:30 AM EDT by a video enabled application and verified that I am speaking with the correct person using two identifiers.  Location: Patient: Home.  Provider: Office.  Learning Objectives: Give examples of problem social cues and helpful social cues.  Explain how to remove problem social cues and add helpful ones.  Describe ways of coping with vacations and social events such as parties, holidays, and visits from relatives and friends.  Create an action plan to change a problem social cue and add a helpful one.   Goals:  Record weight taken outside of class.  Track foods and beverages eaten each day in the "Food and Activity Tracker," including calories and fat grams for each item.   Track activity type, minutes you were active, and distance you reached each day in the "Food and Activity Tracker."  Do your best to reach activity goal for the week. Use action plan created during session to change a problem social cue and add a helpful social cue.  Answer questions regarding success of changing social cues on "To Do Next Week" handout.   Follow-Up Plan: Attend Core Session 15 next week.  Email completed "Food and Activity Tracker" before next week to be reviewed by Lifestyle Coach.

## 2021-10-21 ENCOUNTER — Other Ambulatory Visit: Payer: Self-pay | Admitting: Nurse Practitioner

## 2021-10-21 DIAGNOSIS — R350 Frequency of micturition: Secondary | ICD-10-CM

## 2021-10-21 DIAGNOSIS — N401 Enlarged prostate with lower urinary tract symptoms: Secondary | ICD-10-CM

## 2021-10-24 ENCOUNTER — Encounter: Payer: Medicare Other | Attending: Nurse Practitioner | Admitting: Dietician

## 2021-10-24 ENCOUNTER — Encounter: Payer: Self-pay | Admitting: Dietician

## 2021-10-24 DIAGNOSIS — R7303 Prediabetes: Secondary | ICD-10-CM | POA: Insufficient documentation

## 2021-10-24 NOTE — Progress Notes (Signed)
On 10/24/2021 patient completed Core Session 15 of Diabetes Prevention Program course virtually with [facility]. By the end of this session patients are able to complete the following objectives:   Virtual Visit via Video Note  I connected with Calvin [name]. on 10/24/21 at 10:30 AM EDT by a video enabled application and verified that I am speaking with the correct person using two identifiers.  Location: Patient: home (virtual) Provider: office  Learning Objectives: Explain how to prevent stress or cope with unavoidable stress.  Describe how this program can be a source of stress.  Explain how to manage stressful situations.  Create and follow an action plan for either preventing or coping with a stressful situation.   Goals:  Record weight taken outside of class.  Track foods and beverages eaten each day in the "Food and Activity Tracker," including calories and fat grams for each item.   Track activity type, minutes you were active, and distance you reached each day in the "Food and Activity Tracker."  Do your best to reach activity goal for the week. Follow your action plan to reduce stress.  Answer questions on handout regarding success of action plan.   Follow-Up Plan: Attend Core Session 16 next week.  Email completed "Food and Activity Tracker" before next week to be reviewed by Lifestyle Coach.

## 2021-10-24 NOTE — Telephone Encounter (Signed)
Chart supports Rx Last OV: 08/2021 Next OV: 01/2022  

## 2021-10-26 ENCOUNTER — Other Ambulatory Visit: Payer: Self-pay

## 2021-10-26 ENCOUNTER — Inpatient Hospital Stay: Payer: Medicare Other | Attending: Hematology and Oncology

## 2021-10-26 DIAGNOSIS — D696 Thrombocytopenia, unspecified: Secondary | ICD-10-CM | POA: Diagnosis not present

## 2021-10-26 DIAGNOSIS — I10 Essential (primary) hypertension: Secondary | ICD-10-CM | POA: Insufficient documentation

## 2021-10-26 DIAGNOSIS — D649 Anemia, unspecified: Secondary | ICD-10-CM | POA: Insufficient documentation

## 2021-10-26 DIAGNOSIS — D472 Monoclonal gammopathy: Secondary | ICD-10-CM | POA: Insufficient documentation

## 2021-10-26 DIAGNOSIS — D573 Sickle-cell trait: Secondary | ICD-10-CM | POA: Diagnosis not present

## 2021-10-26 DIAGNOSIS — D72819 Decreased white blood cell count, unspecified: Secondary | ICD-10-CM | POA: Insufficient documentation

## 2021-10-26 DIAGNOSIS — Z87891 Personal history of nicotine dependence: Secondary | ICD-10-CM | POA: Diagnosis not present

## 2021-10-26 LAB — CMP (CANCER CENTER ONLY)
ALT: 16 U/L (ref 0–44)
AST: 28 U/L (ref 15–41)
Albumin: 4.5 g/dL (ref 3.5–5.0)
Alkaline Phosphatase: 49 U/L (ref 38–126)
Anion gap: 3 — ABNORMAL LOW (ref 5–15)
BUN: 17 mg/dL (ref 8–23)
CO2: 28 mmol/L (ref 22–32)
Calcium: 9.3 mg/dL (ref 8.9–10.3)
Chloride: 110 mmol/L (ref 98–111)
Creatinine: 1.48 mg/dL — ABNORMAL HIGH (ref 0.61–1.24)
GFR, Estimated: 50 mL/min — ABNORMAL LOW (ref >60)
Glucose, Bld: 88 mg/dL (ref 70–99)
Potassium: 3.8 mmol/L (ref 3.5–5.1)
Sodium: 141 mmol/L (ref 135–145)
Total Bilirubin: 0.6 mg/dL (ref 0.3–1.2)
Total Protein: 7.6 g/dL (ref 6.5–8.1)

## 2021-10-26 LAB — CBC WITH DIFFERENTIAL (CANCER CENTER ONLY)
Abs Immature Granulocytes: 0.01 10*3/uL (ref 0.00–0.07)
Basophils Absolute: 0 10*3/uL (ref 0.0–0.1)
Basophils Relative: 0 %
Eosinophils Absolute: 0 10*3/uL (ref 0.0–0.5)
Eosinophils Relative: 1 %
HCT: 34.1 % — ABNORMAL LOW (ref 39.0–52.0)
Hemoglobin: 11.7 g/dL — ABNORMAL LOW (ref 13.0–17.0)
Immature Granulocytes: 0 %
Lymphocytes Relative: 41 %
Lymphs Abs: 1 10*3/uL (ref 0.7–4.0)
MCH: 29.2 pg (ref 26.0–34.0)
MCHC: 34.3 g/dL (ref 30.0–36.0)
MCV: 85 fL (ref 80.0–100.0)
Monocytes Absolute: 0.4 10*3/uL (ref 0.1–1.0)
Monocytes Relative: 16 %
Neutro Abs: 1 10*3/uL — ABNORMAL LOW (ref 1.7–7.7)
Neutrophils Relative %: 42 %
Platelet Count: 131 10*3/uL — ABNORMAL LOW (ref 150–400)
RBC: 4.01 MIL/uL — ABNORMAL LOW (ref 4.22–5.81)
RDW: 14.6 % (ref 11.5–15.5)
WBC Count: 2.3 10*3/uL — ABNORMAL LOW (ref 4.0–10.5)
nRBC: 0 % (ref 0.0–0.2)

## 2021-10-26 LAB — LACTATE DEHYDROGENASE: LDH: 147 U/L (ref 98–192)

## 2021-10-27 LAB — KAPPA/LAMBDA LIGHT CHAINS
Kappa free light chain: 52.3 mg/L — ABNORMAL HIGH (ref 3.3–19.4)
Kappa, lambda light chain ratio: 2.55 — ABNORMAL HIGH (ref 0.26–1.65)
Lambda free light chains: 20.5 mg/L (ref 5.7–26.3)

## 2021-10-28 LAB — MULTIPLE MYELOMA PANEL, SERUM
Albumin SerPl Elph-Mcnc: 3.8 g/dL (ref 2.9–4.4)
Albumin/Glob SerPl: 1.3 (ref 0.7–1.7)
Alpha 1: 0.2 g/dL (ref 0.0–0.4)
Alpha2 Glob SerPl Elph-Mcnc: 0.6 g/dL (ref 0.4–1.0)
B-Globulin SerPl Elph-Mcnc: 1 g/dL (ref 0.7–1.3)
Gamma Glob SerPl Elph-Mcnc: 1.3 g/dL (ref 0.4–1.8)
Globulin, Total: 3 g/dL (ref 2.2–3.9)
IgA: 161 mg/dL (ref 61–437)
IgG (Immunoglobin G), Serum: 1180 mg/dL (ref 603–1613)
IgM (Immunoglobulin M), Srm: 104 mg/dL (ref 15–143)
M Protein SerPl Elph-Mcnc: 0.3 g/dL — ABNORMAL HIGH
Total Protein ELP: 6.8 g/dL (ref 6.0–8.5)

## 2021-10-31 ENCOUNTER — Encounter: Payer: Self-pay | Admitting: Dietician

## 2021-10-31 ENCOUNTER — Encounter: Payer: Medicare Other | Attending: Nurse Practitioner | Admitting: Dietician

## 2021-10-31 DIAGNOSIS — R7303 Prediabetes: Secondary | ICD-10-CM

## 2021-10-31 NOTE — Progress Notes (Signed)
On 10/31/2021 patient completed Core Session 16 of Diabetes Prevention Program course virtually with [facility]. By the end of this session patients are able to complete the following objectives:   Virtual Visit via Video Note  I connected with Vanessa [place]. on 10/31/21 at 10:30 AM EDT by a video enabled application and verified that I am speaking with the correct person using two identifiers.  Location: Patient: home (virtual) Provider: office  Learning Objectives: Measure their progress toward weight and physical activity goals since Session 1.  Develop a plan for improving progress, if their goals have not yet been attained.  Describe ways to stay motivated long-term.   Goals:  Record weight taken outside of class.  Track foods and beverages eaten each day in the "Food and Activity Tracker," including calories and fat grams for each item.   Track activity type, minutes you were active, and distance you reached each day in the "Food and Activity Tracker."  Utilize action plan to help stay motivated and complete questions on "To Do List."   Follow-Up Plan: Attend session 17 in two weeks.  Email completed "Food and Activity Tracker" before next session to be reviewed by Lifestyle Coach.

## 2021-11-02 ENCOUNTER — Inpatient Hospital Stay: Payer: Medicare Other | Admitting: Hematology and Oncology

## 2021-11-02 ENCOUNTER — Other Ambulatory Visit: Payer: Self-pay

## 2021-11-02 VITALS — BP 130/84 | HR 61 | Temp 97.2°F | Resp 16 | Wt 245.0 lb

## 2021-11-02 DIAGNOSIS — I10 Essential (primary) hypertension: Secondary | ICD-10-CM | POA: Diagnosis not present

## 2021-11-02 DIAGNOSIS — D472 Monoclonal gammopathy: Secondary | ICD-10-CM

## 2021-11-02 DIAGNOSIS — D649 Anemia, unspecified: Secondary | ICD-10-CM | POA: Diagnosis not present

## 2021-11-02 DIAGNOSIS — D696 Thrombocytopenia, unspecified: Secondary | ICD-10-CM | POA: Diagnosis not present

## 2021-11-02 DIAGNOSIS — Z87891 Personal history of nicotine dependence: Secondary | ICD-10-CM | POA: Diagnosis not present

## 2021-11-02 DIAGNOSIS — D72819 Decreased white blood cell count, unspecified: Secondary | ICD-10-CM | POA: Diagnosis not present

## 2021-11-02 NOTE — Progress Notes (Signed)
[place] [phone]   [phone]  PROGRESS NOTE  Patient Care Team: Nche, Charlene Brooke, NP as PCP - General (Internal Medicine) Franchot Gallo, MD as Consulting Physician (Urology) Netta Cedars, MD as Consulting Physician (Orthopedic Surgery) Germaine Pomfret, [facility] as Pharmacist (Pharmacist)  Hematological/Oncological History  # Normocytic Anemia 1) 05/29/2018: HB electrophoresis confirms sickle cell trait. Ferritin 18. Hgb 13.1. MMA 107 2) 01/22/2019: iron 112, TIBC 313, Sat 36%, ferritin 31. Hgb 12.6 3) 01/27/2019: establish care with Dr. Lorenso Courier. WBC 2.6, Hgb 12.6, Plt 121, MCV 91.4 4) 10/20/2019: WBC 3.0, Hgb 12.9, Plt 115, MCV 91   #IgG Kappa MGUS 1) 09/18/18:  M protein 0.2, kappa 38, lambda 17.6, ratio 2.2 2) 01/27/2019: establish care with Dr. Lorenso Courier  3) 10/20/2019: M protein 0.1, Kappa 48.3, Lambda 18, Ratio 2.68 4) 11/05/2020: M protein 0.3, Kappa 56, Lambda 19.2, Ratio 2.92 5) 04/28/2021: M protein 0.2, Kappa 58.9, Lambda 24.1, Ratio 2.44  #Leukopenia 1) Chronically low, with WBC 3.5 dating back to 03/24/2009 2) 2015-2020: stable at baseline WBC 3.5-5.0 3) 01/27/2019: establish care with Dr. Lorenso Courier.  WBC 2.6, Hgb 12.6, Plt 121, MCV 91.4 4) 10/20/2019: WBC 3.0, Hgb 12.9, Plt 115, MCV 91 5) 10/26/2021: WBC 2.3, Hgb 11.7, MCV 85, Plt 131  Interval History:  Calvin Broad. 71 y.o. male with medical history significant for MGUS presents for a follow up visit. He was last seen on 05/05/2021. In the interim since our last visit he has had no ED visits or hospitalizations.   On exam today Calvin [name] reports he has been feeling "all right" in the interim since her last visit.  His energy has been good and his appetite has been steady.  His weight has also been steady.  He has been taking a nutrition class and doing his best to try and lose weight.  He has been tracking his calories and eating a more balanced diet.  He notes that he does  eat red meat on occasion but "not a whole lot".  Overall he has had no other major changes in his health.  He otherwise denies any fevers, chills, sweats, nausea, vomiting or diarrhea.  Full 10 point ROS is listed below.    MEDICAL HISTORY:  Past Medical History:  Diagnosis Date   Allergy    Arthritis    Diverticulosis    Gout    Hyperlipidemia    Hypertension    Iron deficiency anemia 09/20/2018   OSA (obstructive sleep apnea) 11/13/2013   Sickle cell trait ([place])    Sleep apnea     SURGICAL HISTORY: Past Surgical History:  Procedure Laterality Date   COLONOSCOPY     POLYPECTOMY     ROTATOR CUFF REPAIR     left   THROAT SURGERY     polyps removed from vocal cord    SOCIAL HISTORY: Social History   Socioeconomic History   Marital status: Married    Spouse name: Not on file   Number of children: 1   Years of education: Not on file   Highest education level: Not on file  Occupational History   Occupation: retired  Tobacco Use   Smoking status: Former    Packs/day: 2.00    Years: 5.00    Total pack years: 10.00    Types: Cigarettes    Quit date: 05/23/1977    Years since quitting: 44.4   Smokeless tobacco: Never  Vaping Use   Vaping Use: Never used  Substance and Sexual Activity   Alcohol use: No    Comment: QUIT 2002   Drug use: No    Comment: Completed program in april 2000   Sexual activity: Yes  Other Topics Concern   Not on file  Social History Narrative   Not on file   Social Determinants of Health   Financial Resource Strain: Low Risk  (12/13/2020)   Overall Financial Resource Strain (CARDIA)    Difficulty of Paying Living Expenses: Not hard at all  Food Insecurity: No Food Insecurity (02/15/2021)   Hunger Vital Sign    Worried About Running Out of Food in the Last Year: Never true    Ran Out of Food in the Last Year: Never true  Transportation Needs: No Transportation Needs (02/15/2021)   PRAPARE - Hydrologist  (Medical): No    Lack of Transportation (Non-Medical): No  Physical Activity: Insufficiently Active (02/15/2021)   Exercise Vital Sign    Days of Exercise per Week: 3 days    Minutes of Exercise per Session: 30 min  Stress: No Stress Concern Present (02/15/2021)   [place]    Feeling of Stress : Not at all  Social Connections: [place] (02/15/2021)   Social Connection and Isolation Panel [NHANES]    Frequency of Communication with Friends and Family: Twice a week    Frequency of Social Gatherings with Friends and Family: Twice a week    Attends Religious Services: More than 4 times per year    Active Member of [employer] or Organizations: Yes    Attends Music therapist: More than 4 times per year    Marital Status: Married  Human resources officer Violence: Not At Risk (02/15/2021)   Humiliation, Afraid, Rape, and Kick questionnaire    Fear of Current or Ex-Partner: No    Emotionally Abused: No    Physically Abused: No    Sexually Abused: No    FAMILY HISTORY: Family History  Problem Relation Age of Onset   Colon cancer Neg Hx    Esophageal cancer Neg Hx    Rectal cancer Neg Hx    Stomach cancer Neg Hx     ALLERGIES:  has No Known Allergies.  MEDICATIONS:  Current Outpatient Medications  Medication Sig Dispense Refill   acetaminophen (TYLENOL) 500 MG tablet Take 1,000 mg by mouth every 6 (six) hours as needed.     allopurinol (ZYLOPRIM) 300 MG tablet Take 1 tablet (300 mg total) by mouth daily. 90 tablet 3   aspirin 81 MG tablet Take 81 mg by mouth daily.     diclofenac Sodium (VOLTAREN) 1 % GEL Apply 2 g topically 4 (four) times daily as needed. (Patient not taking: Reported on 08/23/2021)     fenofibrate 54 MG tablet Take 1 tablet (54 mg total) by mouth daily. 90 tablet 3   finasteride (PROSCAR) 5 MG tablet TAKE 1 TABLET BY MOUTH  DAILY 90 tablet 3   fluticasone (FLONASE) 50 MCG/ACT nasal spray  Place 2 sprays into both nostrils daily. (Patient not taking: Reported on 08/23/2021)     irbesartan (AVAPRO) 300 MG tablet Take 1 tablet (300 mg total) by mouth daily. 90 tablet 3   loratadine (CLARITIN) 10 MG tablet Take 10 mg by mouth daily. (Patient not taking: Reported on 08/23/2021)     pantoprazole (PROTONIX) 40 MG tablet TAKE 1 TABLET BY MOUTH DAILY  PLEASE CALL OFFICE TO SCHEDULE  FOLLOW  UP 90 tablet 1   rosuvastatin (CRESTOR) 20 MG tablet Take 1 tablet (20 mg total) by mouth daily. 90 tablet 3   sildenafil (VIAGRA) 100 MG tablet Take 1 tablet (100 mg total) by mouth as needed for erectile dysfunction. Yearly physical w/labs due in February must see MD for refills 10 tablet 0   tamsulosin (FLOMAX) 0.4 MG CAPS capsule TAKE 1 CAPSULE BY MOUTH DAILY 90 capsule 3   verapamil (CALAN-SR) 240 MG CR tablet TAKE 1 TABLET BY MOUTH AT  BEDTIME 90 tablet 3   No current facility-administered medications for this visit.    REVIEW OF SYSTEMS:   Constitutional: ( - ) fevers, ( - )  chills , ( - ) night sweats Eyes: ( - ) blurriness of vision, ( - ) double vision, ( - ) watery eyes Ears, nose, mouth, throat, and face: ( - ) mucositis, ( - ) sore throat Respiratory: ( - ) cough, ( - ) dyspnea, ( - ) wheezes Cardiovascular: ( - ) palpitation, ( - ) chest discomfort, ( - ) lower extremity swelling Gastrointestinal:  ( - ) nausea, ( - ) heartburn, ( - ) change in bowel habits Skin: ( - ) abnormal skin rashes Lymphatics: ( - ) new lymphadenopathy, ( - ) easy bruising Neurological: ( - ) numbness, ( - ) tingling, ( - ) new weaknesses Behavioral/Psych: ( - ) mood change, ( - ) new changes  All other systems were reviewed with the patient and are negative.  PHYSICAL EXAMINATION: ECOG PERFORMANCE STATUS: 1 - Symptomatic but completely ambulatory  Vitals:   11/02/21 1011  BP: 130/84  Pulse: 61  Resp: 16  Temp: (!) 97.2 F (36.2 C)  SpO2: 93%   Filed Weights   11/02/21 1011  Weight: 245 lb (111.1  kg)    GENERAL: well appearing African American male in NAD  SKIN: skin color, texture, turgor are normal, no rashes or significant lesions EYES: conjunctiva are pink and non-injected, sclera clear LUNGS: clear to auscultation and percussion with normal breathing effort HEART: regular rate & rhythm and no murmurs and no lower extremity edema Musculoskeletal: no cyanosis of digits and no clubbing  PSYCH: alert & oriented x 3, fluent speech NEURO: no focal motor/sensory deficits  LABORATORY DATA:  I have reviewed the data as listed    Latest Ref Rng & Units 10/26/2021   10:44 AM 04/28/2021   10:24 AM 11/05/2020    9:31 AM  CBC  WBC 4.0 - 10.5 K/uL 2.3  3.1  3.2   Hemoglobin 13.0 - 17.0 g/dL 11.7  11.3  11.4   Hematocrit 39.0 - 52.0 % 34.1  34.5  33.2   Platelets 150 - 400 K/uL 131  163  116       Latest Ref Rng & Units 10/26/2021   10:44 AM 04/28/2021   10:24 AM 11/05/2020    9:31 AM  CMP  Glucose 70 - 99 mg/dL 88  98  109   BUN 8 - 23 mg/dL _0 Creatinine 0.61 - 1.24 mg/dL 1.48  1.33  1.60   Sodium 135 - 145 mmol/L 141  142  142   Potassium 3.5 - 5.1 mmol/L 3.8  4.3  3.9   Chloride 98 - 111 mmol/L 110  109  110   CO2 22 - 32 mmol/L _1 Calcium 8.9 - 10.3 mg/dL 9.3  9.0  9.1   Total Protein  6.5 - 8.1 g/dL 7.6  7.4  7.3   Total Bilirubin 0.3 - 1.2 mg/dL 0.6  0.5  0.5   Alkaline Phos 38 - 126 U/L 49  56  55   AST 15 - 41 U/L _0 ALT 0 - 44 U/L _1 Lab Results  Component Value Date   KPAFRELGTCHN 52.3 (H) 10/26/2021   KPAFRELGTCHN 58.9 (H) 04/28/2021   KPAFRELGTCHN 56.0 (H) 11/05/2020   LAMBDASER 20.5 10/26/2021   LAMBDASER 24.1 04/28/2021   LAMBDASER 19.2 11/05/2020   KAPLAMBRATIO 2.55 (H) 10/26/2021   KAPLAMBRATIO 2.44 (H) 04/28/2021   KAPLAMBRATIO 2.92 (H) 11/05/2020    Lab Results  Component Value Date   MPROTEIN 0.3 (H) 10/26/2021   MPROTEIN 0.2 (H) 04/28/2021   MPROTEIN 0.3 (H) 11/05/2020     RADIOGRAPHIC STUDIES: None  to review  ASSESSMENT & PLAN Calvin Broad. 71 y.o. male with medical history significant for MGUS presents for a follow up visit.  Review of Mr. Kervin labs appear stable.    Given the stability of his reported health and the stability of his labs I think we can safely follow-up the patient approximately 6 months time.  At this juncture there is no clear indication for a bone marrow biopsy though I have would have a low threshold to perform this if any worsening abnormalities were noted in his blood.  We will plan to see the patient back in approximately 6 months however if he were to have any new symptoms in the interim we recommend that he call our clinic and we can always see him sooner.  #Normocytic Anemia, stable --HB electrophoresis confirms sickle cell trait, though he has had normal baseline Hgb before in the past.  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 He may have developed low iron stores due to this generosity. He last donated in 2018. His counts appear stable at this time. Iron stores appear replete as of 04/30/2019.  --RTC in 6 months for further review.   #IgG Kappa Monoclonal Gammopathy of Undetermined Significance, stable  --last SPEP/SFLC showed stable labs --will repeat SPEP and SFLC 1 week prior to his next visit.   --patient will require a bone survey at least yearly. Bone met survey last performed in Sept 2022.  --continue to monitor at least q 55month.   #Leukopenia, stable --low WBC dating back to 2011, stably low around 3.5-.5.0.  --WBC 2.3 on 10/26/2021.   --05/29/2018: negative for HIV, Hep C, but had Hep B core antibody, no surface antigen or surface antibody.  --continue to monitor   #Thrombocytopenia --chronic fluctuating thrombocytopenia. There does appear to be a component of clumping --Plt 131 on 10/26/2021, though typically ranges from 100-150 --CT scan of abdomen in March 2020 showed no evidence of  cirrhosis. --continue to monitor   #Suspected Arrhythmia --on ascultation of heart I thought I detected a slight arrhythmia at prior visit. --ordered EKG to confirm. No abnormalities on EKG --no issue, continued to monitor  No orders of the defined types were placed in this encounter.  All questions were answered. The patient knows to call the clinic with any problems, questions or concerns.  A total of more than 30 minutes were spent face-to-face with the patient during this encounter and over half of that time was spent on counseling and coordination of care as outlined above.   JJenny Reichmann  Lauretta Grill, MD Department of Hematology/Oncology [facility] Phone: [phone] Pager: [phone] Email: [email or web address]  11/06/2021 1:26 PM

## 2021-11-06 ENCOUNTER — Encounter: Payer: Self-pay | Admitting: Internal Medicine

## 2021-11-08 DIAGNOSIS — M79672 Pain in left foot: Secondary | ICD-10-CM | POA: Diagnosis not present

## 2021-11-08 DIAGNOSIS — M7662 Achilles tendinitis, left leg: Secondary | ICD-10-CM | POA: Diagnosis not present

## 2021-11-21 ENCOUNTER — Other Ambulatory Visit: Payer: Self-pay | Admitting: Internal Medicine

## 2021-11-21 DIAGNOSIS — M2560 Stiffness of unspecified joint, not elsewhere classified: Secondary | ICD-10-CM | POA: Diagnosis not present

## 2021-11-21 DIAGNOSIS — R262 Difficulty in walking, not elsewhere classified: Secondary | ICD-10-CM | POA: Diagnosis not present

## 2021-11-21 DIAGNOSIS — M7662 Achilles tendinitis, left leg: Secondary | ICD-10-CM | POA: Diagnosis not present

## 2021-11-24 DIAGNOSIS — G4733 Obstructive sleep apnea (adult) (pediatric): Secondary | ICD-10-CM | POA: Diagnosis not present

## 2021-11-28 ENCOUNTER — Encounter: Payer: Medicare Other | Attending: Nurse Practitioner | Admitting: Dietician

## 2021-11-28 ENCOUNTER — Encounter: Payer: Self-pay | Admitting: Dietician

## 2021-11-28 DIAGNOSIS — R7303 Prediabetes: Secondary | ICD-10-CM | POA: Insufficient documentation

## 2021-11-28 NOTE — Progress Notes (Signed)
On 11/28/2021 patient completed a post core session of the Diabetes Prevention Program course virtually with [facility]. By the end of this session patients are able to complete the following objectives:   Virtual Visit via Video Note  I connected with Calvin [name]. "Calvin [name]" by a video enabled application and verified that I am speaking with the correct person using two identifiers.  Location: Patient: home (virtual) Provider: office  Learning Objectives: Identify how to maintain and/or continue working toward program goals for the remainder of the program.  Describe ways that food and activity tracking can assist them in maintaining/reaching program goals.  Identify progress they have made since the beginning of the program.   Goals:  Record weight taken outside of class.  Track foods and beverages eaten each day in the "Food and Activity Tracker," including calories and fat grams for each item.   Track activity type, minutes you were active, and distance you reached each day in the "Food and Activity Tracker."   Follow-Up Plan: Attend session 18 in two weeks.  Email completed "Food and Activity Trackers" before next session to be reviewed by Lifestyle Coach.

## 2021-12-06 DIAGNOSIS — R262 Difficulty in walking, not elsewhere classified: Secondary | ICD-10-CM | POA: Diagnosis not present

## 2021-12-06 DIAGNOSIS — M2560 Stiffness of unspecified joint, not elsewhere classified: Secondary | ICD-10-CM | POA: Diagnosis not present

## 2021-12-06 DIAGNOSIS — M7662 Achilles tendinitis, left leg: Secondary | ICD-10-CM | POA: Diagnosis not present

## 2021-12-19 ENCOUNTER — Other Ambulatory Visit: Payer: Self-pay | Admitting: Nurse Practitioner

## 2021-12-19 DIAGNOSIS — E782 Mixed hyperlipidemia: Secondary | ICD-10-CM

## 2021-12-19 DIAGNOSIS — I10 Essential (primary) hypertension: Secondary | ICD-10-CM

## 2021-12-19 DIAGNOSIS — M1A0620 Idiopathic chronic gout, left knee, without tophus (tophi): Secondary | ICD-10-CM

## 2021-12-26 ENCOUNTER — Encounter: Payer: Self-pay | Admitting: Dietician

## 2021-12-26 ENCOUNTER — Encounter: Payer: Medicare Other | Attending: Nurse Practitioner | Admitting: Dietician

## 2021-12-26 DIAGNOSIS — R7303 Prediabetes: Secondary | ICD-10-CM | POA: Insufficient documentation

## 2021-12-26 NOTE — Telephone Encounter (Signed)
Chart supports Rx Last OV: 08/2021 Next OV: 01/2022  

## 2021-12-26 NOTE — Progress Notes (Signed)
On 12/26/2021 patient completed a post core session of Diabetes Prevention Program course virtually with [facility]. By the end of this session patients are able to complete the following objectives:   Virtual Visit via Video Note  I connected with Calvin [name]. by a video enabled application and verified that I am speaking with the correct person using two identifiers.   I discussed the limitations of evaluation and management by telemedicine and the availability of in person appointments. The patient expressed understanding and agreed to proceed.  Location: Patient: home (virtual) Provider: office  Learning Objectives: Explain how glucose is used in the body and it's relationship with insulin/insulin resistance.  Identify symptoms of diabetes.  Describe lab tests used to diagnose diabetes.  Describe health complications and conditions related to diabetes.   Goals:  Record weight taken outside of class.  Track foods and beverages eaten each day in the "Food and Activity Tracker," including calories and fat grams for each item.   Track activity type, minutes you were active, and distance you reached each day in the "Food and Activity Tracker."   Follow-Up Plan: Attend next session.  Email completed "Food and Activity Trackers" before next session to be reviewed by Lifestyle Coach.

## 2022-01-25 ENCOUNTER — Inpatient Hospital Stay: Payer: Medicare Other | Attending: Hematology and Oncology

## 2022-01-25 ENCOUNTER — Other Ambulatory Visit: Payer: Self-pay | Admitting: *Deleted

## 2022-01-25 DIAGNOSIS — D649 Anemia, unspecified: Secondary | ICD-10-CM | POA: Diagnosis not present

## 2022-01-25 DIAGNOSIS — D72819 Decreased white blood cell count, unspecified: Secondary | ICD-10-CM | POA: Insufficient documentation

## 2022-01-25 DIAGNOSIS — D472 Monoclonal gammopathy: Secondary | ICD-10-CM

## 2022-01-25 LAB — CMP (CANCER CENTER ONLY)
ALT: 12 U/L (ref 0–44)
AST: 21 U/L (ref 15–41)
Albumin: 4.6 g/dL (ref 3.5–5.0)
Alkaline Phosphatase: 46 U/L (ref 38–126)
Anion gap: 4 — ABNORMAL LOW (ref 5–15)
BUN: 24 mg/dL — ABNORMAL HIGH (ref 8–23)
CO2: 29 mmol/L (ref 22–32)
Calcium: 9.3 mg/dL (ref 8.9–10.3)
Chloride: 110 mmol/L (ref 98–111)
Creatinine: 1.49 mg/dL — ABNORMAL HIGH (ref 0.61–1.24)
GFR, Estimated: 49 mL/min — ABNORMAL LOW (ref >60)
Glucose, Bld: 115 mg/dL — ABNORMAL HIGH (ref 70–99)
Potassium: 4 mmol/L (ref 3.5–5.1)
Sodium: 143 mmol/L (ref 135–145)
Total Bilirubin: 0.5 mg/dL (ref 0.3–1.2)
Total Protein: 7.6 g/dL (ref 6.5–8.1)

## 2022-01-25 LAB — LACTATE DEHYDROGENASE: LDH: 124 U/L (ref 98–192)

## 2022-01-25 LAB — CBC WITH DIFFERENTIAL (CANCER CENTER ONLY)
Abs Immature Granulocytes: 0 10*3/uL (ref 0.00–0.07)
Basophils Absolute: 0 10*3/uL (ref 0.0–0.1)
Basophils Relative: 0 %
Eosinophils Absolute: 0 10*3/uL (ref 0.0–0.5)
Eosinophils Relative: 1 %
HCT: 35.9 % — ABNORMAL LOW (ref 39.0–52.0)
Hemoglobin: 12.3 g/dL — ABNORMAL LOW (ref 13.0–17.0)
Immature Granulocytes: 0 %
Lymphocytes Relative: 38 %
Lymphs Abs: 1.2 10*3/uL (ref 0.7–4.0)
MCH: 30.1 pg (ref 26.0–34.0)
MCHC: 34.3 g/dL (ref 30.0–36.0)
MCV: 87.8 fL (ref 80.0–100.0)
Monocytes Absolute: 0.2 10*3/uL (ref 0.1–1.0)
Monocytes Relative: 5 %
Neutro Abs: 1.8 10*3/uL (ref 1.7–7.7)
Neutrophils Relative %: 56 %
Platelet Count: 127 10*3/uL — ABNORMAL LOW (ref 150–400)
RBC: 4.09 MIL/uL — ABNORMAL LOW (ref 4.22–5.81)
RDW: 14.9 % (ref 11.5–15.5)
WBC Count: 3.2 10*3/uL — ABNORMAL LOW (ref 4.0–10.5)
nRBC: 0 % (ref 0.0–0.2)

## 2022-01-26 LAB — KAPPA/LAMBDA LIGHT CHAINS
Kappa free light chain: 50.4 mg/L — ABNORMAL HIGH (ref 3.3–19.4)
Kappa, lambda light chain ratio: 2.95 — ABNORMAL HIGH (ref 0.26–1.65)
Lambda free light chains: 17.1 mg/L (ref 5.7–26.3)

## 2022-01-30 ENCOUNTER — Encounter: Payer: Self-pay | Admitting: Dietician

## 2022-01-30 ENCOUNTER — Encounter: Payer: Medicare Other | Attending: Nurse Practitioner | Admitting: Dietician

## 2022-01-30 DIAGNOSIS — R7303 Prediabetes: Secondary | ICD-10-CM | POA: Insufficient documentation

## 2022-01-30 LAB — MULTIPLE MYELOMA PANEL, SERUM
Albumin SerPl Elph-Mcnc: 4 g/dL (ref 2.9–4.4)
Albumin/Glob SerPl: 1.4 (ref 0.7–1.7)
Alpha 1: 0.2 g/dL (ref 0.0–0.4)
Alpha2 Glob SerPl Elph-Mcnc: 0.6 g/dL (ref 0.4–1.0)
B-Globulin SerPl Elph-Mcnc: 0.9 g/dL (ref 0.7–1.3)
Gamma Glob SerPl Elph-Mcnc: 1.3 g/dL (ref 0.4–1.8)
Globulin, Total: 3 g/dL (ref 2.2–3.9)
IgA: 151 mg/dL (ref 61–437)
IgG (Immunoglobin G), Serum: 1216 mg/dL (ref 603–1613)
IgM (Immunoglobulin M), Srm: 101 mg/dL (ref 15–143)
M Protein SerPl Elph-Mcnc: 0.3 g/dL — ABNORMAL HIGH
Total Protein ELP: 7 g/dL (ref 6.0–8.5)

## 2022-01-30 NOTE — Progress Notes (Signed)
On 01/30/2022 patient completed a post core session of the Diabetes Prevention Program course virtually with [facility]. By the end of this session patients are able to complete the following objectives:   Virtual Visit via Video Note  I connected with Calvin [name]. by a video enabled application and verified that I am speaking with the correct person using two identifiers.  Location: Patient: home (virtual) Provider: office  Learning Objectives: List 9 ways to to stay on track during special events/occasions. Create a plan to avoid a food problem at an upcoming special event/occasion Describe ways to make time for healthy behaviors during special events/occasions   Goals:  Record weight taken outside of class.  Track foods and beverages eaten each day in the "Food and Activity Tracker," including calories and fat grams for each item.   Track activity type, minutes you were active, and distance you reached each day in the "Food and Activity Tracker."   Follow-Up Plan: Attend next session.  Email completed "Food and Activity Trackers" before next session to be reviewed by Lifestyle Coach.

## 2022-02-01 ENCOUNTER — Ambulatory Visit (INDEPENDENT_AMBULATORY_CARE_PROVIDER_SITE_OTHER): Payer: Medicare Other | Admitting: Nurse Practitioner

## 2022-02-01 ENCOUNTER — Encounter: Payer: Self-pay | Admitting: Nurse Practitioner

## 2022-02-01 VITALS — BP 132/78 | HR 66 | Temp 95.9°F | Ht 67.0 in | Wt 243.0 lb

## 2022-02-01 DIAGNOSIS — N400 Enlarged prostate without lower urinary tract symptoms: Secondary | ICD-10-CM

## 2022-02-01 DIAGNOSIS — E782 Mixed hyperlipidemia: Secondary | ICD-10-CM

## 2022-02-01 DIAGNOSIS — I10 Essential (primary) hypertension: Secondary | ICD-10-CM

## 2022-02-01 DIAGNOSIS — R739 Hyperglycemia, unspecified: Secondary | ICD-10-CM | POA: Diagnosis not present

## 2022-02-01 LAB — HEMOGLOBIN A1C: Hgb A1c MFr Bld: 5.7 % (ref 4.6–6.5)

## 2022-02-01 LAB — LDL CHOLESTEROL, DIRECT: Direct LDL: 57 mg/dL

## 2022-02-01 LAB — PSA: PSA: 1.91 ng/mL (ref 0.10–4.00)

## 2022-02-01 NOTE — Progress Notes (Signed)
Established Patient Visit  Patient: Calvin [name].   DOB: 09/25/1950   71 y.o. Male  MRN: [id number] Visit Date: 02/01/2022  Subjective:    Chief Complaint  Patient presents with   Office Visit    Hyperlipidemia / HTN/ Prediabetes  Pt not fasting  Checks BP daily but forgot his readings and Home BP cuff  No concerns    HPI Essential hypertension Bp at goal with verapamil and irbesartan Compliant with CPAP machine BP Readings from Last 3 Encounters:  02/01/22 132/78  11/02/21 130/84  08/23/21 138/80    Maintain med doses Stable renal function  Hyperglycemia Repeat hgbA1c  Mixed hyperlipidemia No adverse effects with crestor and fenofibrate Repeat direct LDL due to non fasting today  BPH without obstruction/lower urinary tract symptoms Current use of finasteride and flomax Asymptomatic Repeat PSA today  Has annual wellness visit with insurance nurse at home.  Wt Readings from Last 3 Encounters:  02/01/22 243 lb (110.2 kg)  11/02/21 245 lb (111.1 kg)  08/23/21 244 lb (110.7 kg)     Reviewed medical, surgical, and social history today  Medications: Outpatient Medications Prior to Visit  Medication Sig   allopurinol (ZYLOPRIM) 300 MG tablet TAKE 1 TABLET BY MOUTH DAILY   aspirin 81 MG tablet Take 81 mg by mouth daily.   fenofibrate 54 MG tablet Take 1 tablet (54 mg total) by mouth daily.   finasteride (PROSCAR) 5 MG tablet TAKE 1 TABLET BY MOUTH  DAILY   irbesartan (AVAPRO) 300 MG tablet TAKE 1 TABLET BY MOUTH DAILY   Multiple Vitamins-Minerals (CENTRUM MEN PO) Take 1 tablet by mouth daily.   rosuvastatin (CRESTOR) 20 MG tablet TAKE 1 TABLET BY MOUTH DAILY   sildenafil (VIAGRA) 100 MG tablet Take 1 tablet (100 mg total) by mouth as needed for erectile dysfunction. Yearly physical w/labs due in February must see MD for refills   tamsulosin (FLOMAX) 0.4 MG CAPS capsule TAKE 1 CAPSULE BY MOUTH DAILY   verapamil (CALAN-SR) 240 MG CR tablet TAKE 1  TABLET BY MOUTH AT  BEDTIME   [DISCONTINUED] acetaminophen (TYLENOL) 500 MG tablet Take 1,000 mg by mouth every 6 (six) hours as needed. (Patient not taking: Reported on 02/01/2022)   [DISCONTINUED] diclofenac Sodium (VOLTAREN) 1 % GEL Apply 2 g topically 4 (four) times daily as needed. (Patient not taking: Reported on 08/23/2021)   [DISCONTINUED] fluticasone (FLONASE) 50 MCG/ACT nasal spray Place 2 sprays into both nostrils daily. (Patient not taking: Reported on 08/23/2021)   [DISCONTINUED] loratadine (CLARITIN) 10 MG tablet Take 10 mg by mouth daily. (Patient not taking: Reported on 08/23/2021)   [DISCONTINUED] pantoprazole (PROTONIX) 40 MG tablet TAKE 1 TABLET BY MOUTH DAILY  PLEASE CALL OFFICE TO SCHEDULE  FOLLOW UP (Patient not taking: Reported on 02/01/2022)   No facility-administered medications prior to visit.   Reviewed past medical and social history.   ROS per HPI above      Objective:  BP 132/78   Pulse 66   Temp (!) 95.9 F (35.5 C) (Temporal)   Ht '5\' 7"'$  (1.702 m)   Wt 243 lb (110.2 kg)   SpO2 99%   BMI 38.06 kg/m      Physical Exam Cardiovascular:     Rate and Rhythm: Normal rate and regular rhythm.     Pulses: Normal pulses.     Heart sounds: Normal heart sounds.  Pulmonary:  Effort: Pulmonary effort is normal.     Breath sounds: Normal breath sounds.  Musculoskeletal:     Right lower leg: No edema.     Left lower leg: No edema.  Neurological:     Mental Status: He is alert and oriented to person, place, and time.     No results found for any visits on 02/01/22.    Assessment & Plan:    Problem List Items Addressed This Visit       Cardiovascular and Mediastinum   Essential hypertension - Primary    Bp at goal with verapamil and irbesartan Compliant with CPAP machine BP Readings from Last 3 Encounters:  02/01/22 132/78  11/02/21 130/84  08/23/21 138/80    Maintain med doses Stable renal function        Genitourinary   BPH without  obstruction/lower urinary tract symptoms    Current use of finasteride and flomax Asymptomatic Repeat PSA today        Other   Hyperglycemia    Repeat hgbA1c      Relevant Orders   Hemoglobin A1c   Mixed hyperlipidemia    No adverse effects with crestor and fenofibrate Repeat direct LDL due to non fasting today      Relevant Orders   Direct LDL   Return in about 6 months (around 08/02/2022) for DM, HTN, hyperlipidemia (fasting).     Wilfred Lacy, NP

## 2022-02-01 NOTE — Assessment & Plan Note (Addendum)
No adverse effects with crestor and fenofibrate Repeat direct LDL due to non fasting today

## 2022-02-01 NOTE — Assessment & Plan Note (Signed)
Current use of finasteride and flomax Asymptomatic Repeat PSA today

## 2022-02-01 NOTE — Assessment & Plan Note (Addendum)
Bp at goal with verapamil and irbesartan Compliant with CPAP machine BP Readings from Last 3 Encounters:  02/01/22 132/78  11/02/21 130/84  08/23/21 138/80    Maintain med doses Stable renal function

## 2022-02-01 NOTE — Patient Instructions (Signed)
Maintain current medications Schedule nurse visit to have BP machine checked. Go to lab

## 2022-02-01 NOTE — Assessment & Plan Note (Signed)
Repeat hgbA1c 

## 2022-02-06 ENCOUNTER — Telehealth: Payer: Self-pay | Admitting: *Deleted

## 2022-02-06 NOTE — Telephone Encounter (Signed)
TCT patient regarding recent labs. No answer but lvm for pt to call back at his convenience to [phone]

## 2022-02-06 NOTE — Telephone Encounter (Signed)
-----   Message from Orson Slick, MD sent at 02/05/2022  6:17 PM EST ----- Please let Calvin [name] know that his blood counts have improved some since his last visit. Unclear why his WBC, Hgb and Plt dropped as low as they did his last visit. We will continue to monitor his counts.  ----- Message ----- From: Interface, Lab In [place] Sent: 01/25/2022  10:54 AM EST To: Orson Slick, MD

## 2022-02-07 ENCOUNTER — Ambulatory Visit (INDEPENDENT_AMBULATORY_CARE_PROVIDER_SITE_OTHER): Payer: Medicare Other

## 2022-02-07 VITALS — BP 122/76 | HR 67

## 2022-02-07 DIAGNOSIS — I10 Essential (primary) hypertension: Secondary | ICD-10-CM | POA: Diagnosis not present

## 2022-02-07 NOTE — Progress Notes (Addendum)
Pt came in to have BP checked and have home electric BP cuff looked at. BP was taken on the office manual cuff and patient cuff to compare. BP 122/76. Provided patient with home BP logs. Advised patient to engage in some sort of cardio as tolerated. No further questions or concerns.

## 2022-02-09 ENCOUNTER — Telehealth: Payer: Self-pay | Admitting: Nurse Practitioner

## 2022-02-09 NOTE — Telephone Encounter (Signed)
Left message for patient to call back and schedule Medicare Annual Wellness Visit (AWV) in office.   If not able to come in office, please offer to do virtually or by telephone.  Left office number and my jabber [phone].  Last AWV:02/15/2021   Please schedule at anytime with Nurse Health Advisor.

## 2022-02-14 LAB — HM DIABETES FOOT EXAM: HM Diabetic Foot Exam: NORMAL

## 2022-02-26 ENCOUNTER — Other Ambulatory Visit: Payer: Self-pay | Admitting: Nurse Practitioner

## 2022-02-26 DIAGNOSIS — E782 Mixed hyperlipidemia: Secondary | ICD-10-CM

## 2022-02-27 ENCOUNTER — Encounter: Payer: Self-pay | Admitting: Dietician

## 2022-02-27 ENCOUNTER — Encounter: Payer: Medicare Other | Attending: Nurse Practitioner | Admitting: Dietician

## 2022-02-27 DIAGNOSIS — R7303 Prediabetes: Secondary | ICD-10-CM | POA: Insufficient documentation

## 2022-02-27 NOTE — Telephone Encounter (Signed)
Chart supports Rx Last OV: 01/2022 Next OV: 08/2022

## 2022-02-27 NOTE — Progress Notes (Signed)
On 02/27/2022 patient completed a post core session of the Diabetes Prevention Program course virtually with [facility]. By the end of this session patients are able to complete the following objectives:    Virtual Visit via Video Note   I connected with Vanessa [place]. by a video enabled application and verified that I am speaking with the correct person using two identifiers.   Location: Patient: home (virtual) Provider: office   Learning Objectives: List ways to make recipes healthier.  List lower-fat and lower-calorie substitutions for common ingredients.  Identify low-fat cooking methods.  Describe how to choose a cookbook that works best for their needs.    Goals:  Record weight taken outside of class.  Track foods and beverages eaten each day in the "Food and Activity Tracker," including calories and fat grams for each item.   Track activity type, minutes you were active, and distance you reached each day in the "Food and Activity Tracker."    Follow-Up Plan: Attend next session.  Email completed "Food and Activity Trackers" before next session to be reviewed by Lifestyle Coach.

## 2022-03-10 DIAGNOSIS — G4733 Obstructive sleep apnea (adult) (pediatric): Secondary | ICD-10-CM | POA: Diagnosis not present

## 2022-03-14 ENCOUNTER — Ambulatory Visit (INDEPENDENT_AMBULATORY_CARE_PROVIDER_SITE_OTHER): Payer: Medicare Other

## 2022-03-14 ENCOUNTER — Ambulatory Visit: Payer: Medicare Other | Admitting: Sports Medicine

## 2022-03-14 VITALS — BP 120/80 | HR 69 | Ht 67.0 in | Wt 250.0 lb

## 2022-03-14 DIAGNOSIS — M25561 Pain in right knee: Secondary | ICD-10-CM

## 2022-03-14 DIAGNOSIS — G8929 Other chronic pain: Secondary | ICD-10-CM

## 2022-03-14 DIAGNOSIS — M1711 Unilateral primary osteoarthritis, right knee: Secondary | ICD-10-CM

## 2022-03-14 NOTE — Progress Notes (Signed)
Calvin [name] D.[place] Phone: [phone]   Assessment and Plan:     1. Chronic pain of right knee 2. Primary osteoarthritis of right knee  -Chronic with exacerbation, initial sports medicine visit - Consistent with flare of osteoarthritis of right knee after patient dropped a weight onto distal quadricep - X-ray obtained in clinic.  My interpretation: No acute fracture or dislocation.  Significant tricompartmental cortical changes with bone spurring, decreased joint space, free-floating cortical densities in lateral compartment, chondrocalcinosis - Patient elected for intra-articular knee CSI.  Tolerated well per note below  Procedure: Knee Joint Injection Side: Right Indication: Flare of osteoarthritis  Risks explained and consent was given verbally. The site was cleaned with alcohol prep. A needle was introduced with an anterio-lateral approach. Injection given using 40m of 1% lidocaine without epinephrine and 173mof kenalog '40mg'$ /ml. This was well tolerated and resulted in symptomatic relief.  Needle was removed, hemostasis achieved, and post injection instructions were explained.   Pt was advised to call or return to clinic if these symptoms worsen or fail to improve as anticipated.   Pertinent previous records reviewed include none   Follow Up: 3 to 4 weeks for reevaluation.  If no improve or worsening of symptoms, could consider alternative injections such as HA versus Zilretta versus PRP.  Could also consider physical therapy   Subjective:   I, MoMalvernam serving as a scEducation administratoror Doctor BeGlennon MacChief Complaint: right knee pain   HPI:   03/14/22 Patient is a [age]ear old male complaining of right knee pain. Patient states that he dropped a weight on his quad , pain radiates down to his patellar tendon, voltaren gel helped , that was a few days ago, states he is still swollen, due to  the swelling it felt like his joint  was ripping and separating when he was walking and then it hurt when he put the leg down , no numbness or tingling, states he was doing alright but when he dropped that weight he regressed   Relevant Historical Information: Gout, hypertension  Additional pertinent review of systems negative.   Current Outpatient Medications:    allopurinol (ZYLOPRIM) 300 MG tablet, TAKE 1 TABLET BY MOUTH DAILY, Disp: 90 tablet, Rfl: 3   aspirin 81 MG tablet, Take 81 mg by mouth daily., Disp: , Rfl:    fenofibrate 54 MG tablet, TAKE 1 TABLET BY MOUTH DAILY, Disp: 90 tablet, Rfl: 3   finasteride (PROSCAR) 5 MG tablet, TAKE 1 TABLET BY MOUTH  DAILY, Disp: 90 tablet, Rfl: 3   irbesartan (AVAPRO) 300 MG tablet, TAKE 1 TABLET BY MOUTH DAILY, Disp: 90 tablet, Rfl: 3   Multiple Vitamins-Minerals (CENTRUM MEN PO), Take 1 tablet by mouth daily., Disp: , Rfl:    rosuvastatin (CRESTOR) 20 MG tablet, TAKE 1 TABLET BY MOUTH DAILY, Disp: 90 tablet, Rfl: 3   sildenafil (VIAGRA) 100 MG tablet, Take 1 tablet (100 mg total) by mouth as needed for erectile dysfunction. Yearly physical w/labs due in February must see MD for refills, Disp: 10 tablet, Rfl: 0   tamsulosin (FLOMAX) 0.4 MG CAPS capsule, TAKE 1 CAPSULE BY MOUTH DAILY, Disp: 90 capsule, Rfl: 3   verapamil (CALAN-SR) 240 MG CR tablet, TAKE 1 TABLET BY MOUTH AT  BEDTIME, Disp: 90 tablet, Rfl: 3   Objective:     Vitals:   03/14/22 0858  BP: 120/80  Pulse: 69  SpO2: 97%  Weight: 250 lb (113.4 kg)  Height: '5\' 7"'$  (1.702 m)      Body mass index is 39.16 kg/m.    Physical Exam:    General:  awake, alert oriented, no acute distress nontoxic Skin: no suspicious lesions or rashes Neuro:sensation intact and strength 5/5 with no deficits, no atrophy, normal muscle tone Psych: No signs of anxiety, depression or other mood disorder  Knee: No swelling No deformity Neg fluid wave, joint milking ROM Flex 90, Ext 30 NTTP over the  quad tendon, medial fem condyle, lat fem condyle, patella, plica, patella tendon, tibial tuberostiy, fibular head, posterior fossa, pes anserine bursa, gerdy's tubercle, medial jt line, lateral jt line Neg anterior and posterior drawer Neg lachman Neg sag sign Negative varus stress Negative valgus stress Negative McMurray for painful palpable pop, though did reproduce pain    Gait antalgic, favoring left knee   Electronically signed by:  Calvin [name] D.[facility] 9:21 AM 03/14/22

## 2022-03-14 NOTE — Patient Instructions (Signed)
Good to see you Knee HEP 3-4 week follow up   

## 2022-03-27 ENCOUNTER — Encounter: Payer: Self-pay | Admitting: Dietician

## 2022-03-27 ENCOUNTER — Encounter: Payer: Medicare Other | Attending: Nurse Practitioner | Admitting: Dietician

## 2022-03-27 DIAGNOSIS — R7303 Prediabetes: Secondary | ICD-10-CM

## 2022-03-27 NOTE — Progress Notes (Signed)
On 03/27/2022 patient completed a post core session of the Diabetes Prevention Program course virtually with [facility]. By the end of this session patients are able to complete the following objectives:   Virtual Visit via Video Note  I connected with Calvin [name]. by a video enabled application and verified that I am speaking with the correct person using two identifiers.  Location: Patient: home (virtual) Provider: office  Learning Objectives: Describe how to incorporate more fruits and vegetables into meals. List criteria for selecting good quality fruits and vegetables at the store.  Define mindful eating. List the benefits of eating mindfully.   Goals:  Record weight taken outside of class.  Track foods and beverages eaten each day in the "Food and Activity Tracker," including calories and fat grams for each item.   Track activity type, minutes you were active, and distance you reached each day in the "Food and Activity Tracker."   Follow-Up Plan: Attend next session.  Email completed "Food and Activity Trackers" before next session to be reviewed by Lifestyle Coach.

## 2022-04-18 ENCOUNTER — Ambulatory Visit: Payer: Medicare Other | Admitting: Sports Medicine

## 2022-04-18 ENCOUNTER — Encounter: Payer: Self-pay | Admitting: Nurse Practitioner

## 2022-04-19 ENCOUNTER — Encounter: Payer: Self-pay | Admitting: Nurse Practitioner

## 2022-04-19 ENCOUNTER — Telehealth (INDEPENDENT_AMBULATORY_CARE_PROVIDER_SITE_OTHER): Payer: Medicare Other | Admitting: Nurse Practitioner

## 2022-04-19 VITALS — Temp 98.0°F | Ht 67.0 in | Wt 235.0 lb

## 2022-04-19 DIAGNOSIS — U071 COVID-19: Secondary | ICD-10-CM

## 2022-04-19 MED ORDER — NIRMATRELVIR/RITONAVIR (PAXLOVID) TABLET (RENAL DOSING): 2.0000 | ORAL_TABLET | Freq: Two times a day (BID) | ORAL | 0 refills | Status: AC

## 2022-04-19 NOTE — Progress Notes (Signed)
Virtual Visit via Video Note  I connected withNAME@ on 04/19/22 at 11:00 AM EST by a video enabled telemedicine application and verified that I am speaking with the correct person using two identifiers.  Location: Patient:Home Provider: Office Participants: patient and provider  I discussed the limitations of evaluation and management by telemedicine and the availability of in person appointments. I also discussed with the patient that there may be a patient responsible charge related to this service. The patient expressed understanding and agreed to proceed.  CC:URI symptoms x 3days, positive home COVID test yesterday  History of Present Illness: URI  This is a new problem. The current episode started in the past 7 days. The problem has been unchanged. There has been no fever. Associated symptoms include congestion, coughing, headaches, joint pain, rhinorrhea and sinus pain. Pertinent negatives include no abdominal pain, chest pain, diarrhea, dysuria, ear pain, joint swelling, nausea, neck pain, plugged ear sensation, rash, sneezing, sore throat, swollen glands, vomiting or wheezing. He has tried acetaminophen and increased fluids (mucinex Dm) for the symptoms. The treatment provided moderate relief.       Latest Ref Rng & Units 01/25/2022   10:38 AM 10/26/2021   10:44 AM 04/28/2021   10:24 AM  BMP  Glucose 70 - 99 mg/dL 115  88  98   BUN 8 - 23 mg/dL '24  17  19   '$ Creatinine 0.61 - 1.24 mg/dL 1.49  1.48  1.33   Sodium 135 - 145 mmol/L 143  141  142   Potassium 3.5 - 5.1 mmol/L 4.0  3.8  4.3   Chloride 98 - 111 mmol/L 110  110  109   CO2 22 - 32 mmol/L '29  28  28   '$ Calcium 8.9 - 10.3 mg/dL 9.3  9.3  9.0     Observations/Objective: Physical Exam Vitals reviewed.  Constitutional:      General: He is not in acute distress. Cardiovascular:     Rate and Rhythm: Normal rate.     Pulses: Normal pulses.  Pulmonary:     Effort: Pulmonary effort is normal.  Neurological:     Mental  Status: He is alert and oriented to person, place, and time.     Assessment and Plan: Calvin [name] was seen today for acute visit.  Diagnoses and all orders for this visit:  COVID-19 -     nirmatrelvir/ritonavir, renal dosing, (PAXLOVID) 10 x 150 MG & 10 x '100MG'$  TABS; Take 2 tablets by mouth 2 (two) times daily for 5 days. (Take nirmatrelvir 150 mg one tablet twice daily for 5 days and ritonavir 100 mg one tablet twice daily for 5 days) Patient GFR is 66m/min   Follow Up Instructions: Hold crestor while taking paxlovid. Take paxlovid with food Call office if no improvement in 1week Go to ED if your symptoms worsen. Maintain adequate oral hydration Continue use of mucinex DM for cough and tylenol for fever.   I discussed the assessment and treatment plan with the patient. The patient was provided an opportunity to ask questions and all were answered. The patient agreed with the plan and demonstrated an understanding of the instructions.   The patient was advised to call back or seek an in-person evaluation if the symptoms worsen or if the condition fails to improve as anticipated.  CWilfred Lacy NP

## 2022-04-19 NOTE — Patient Instructions (Signed)
Hold crestor while taking paxlovid. Take paxlovid with food Call office if no improvement in 1week Go to ED if your symptoms worsen. Maintain adequate oral hydration Continue use of mucinex DM for cough and tylenol for fever

## 2022-04-26 ENCOUNTER — Other Ambulatory Visit: Payer: Self-pay | Admitting: Hematology and Oncology

## 2022-04-26 DIAGNOSIS — D472 Monoclonal gammopathy: Secondary | ICD-10-CM

## 2022-04-27 ENCOUNTER — Inpatient Hospital Stay: Payer: Medicare Other | Attending: Hematology and Oncology

## 2022-04-27 ENCOUNTER — Other Ambulatory Visit: Payer: Self-pay

## 2022-04-27 DIAGNOSIS — D472 Monoclonal gammopathy: Secondary | ICD-10-CM | POA: Diagnosis not present

## 2022-04-27 DIAGNOSIS — D72819 Decreased white blood cell count, unspecified: Secondary | ICD-10-CM | POA: Diagnosis not present

## 2022-04-27 DIAGNOSIS — D696 Thrombocytopenia, unspecified: Secondary | ICD-10-CM | POA: Insufficient documentation

## 2022-04-27 DIAGNOSIS — D573 Sickle-cell trait: Secondary | ICD-10-CM | POA: Insufficient documentation

## 2022-04-27 DIAGNOSIS — Z87891 Personal history of nicotine dependence: Secondary | ICD-10-CM | POA: Diagnosis not present

## 2022-04-27 LAB — CBC WITH DIFFERENTIAL (CANCER CENTER ONLY)
Abs Immature Granulocytes: 0.05 10*3/uL (ref 0.00–0.07)
Basophils Absolute: 0 10*3/uL (ref 0.0–0.1)
Basophils Relative: 0 %
Eosinophils Absolute: 0 10*3/uL (ref 0.0–0.5)
Eosinophils Relative: 1 %
HCT: 39.2 % (ref 39.0–52.0)
Hemoglobin: 13.8 g/dL (ref 13.0–17.0)
Immature Granulocytes: 1 %
Lymphocytes Relative: 34 %
Lymphs Abs: 1.7 10*3/uL (ref 0.7–4.0)
MCH: 30.9 pg (ref 26.0–34.0)
MCHC: 35.2 g/dL (ref 30.0–36.0)
MCV: 87.7 fL (ref 80.0–100.0)
Monocytes Absolute: 0.3 10*3/uL (ref 0.1–1.0)
Monocytes Relative: 6 %
Neutro Abs: 2.9 10*3/uL (ref 1.7–7.7)
Neutrophils Relative %: 58 %
Platelet Count: 181 10*3/uL (ref 150–400)
RBC: 4.47 MIL/uL (ref 4.22–5.81)
RDW: 14 % (ref 11.5–15.5)
WBC Count: 5 10*3/uL (ref 4.0–10.5)
nRBC: 0 % (ref 0.0–0.2)

## 2022-04-27 LAB — CMP (CANCER CENTER ONLY)
ALT: 13 U/L (ref 0–44)
AST: 17 U/L (ref 15–41)
Albumin: 4.2 g/dL (ref 3.5–5.0)
Alkaline Phosphatase: 43 U/L (ref 38–126)
Anion gap: 9 (ref 5–15)
BUN: 26 mg/dL — ABNORMAL HIGH (ref 8–23)
CO2: 27 mmol/L (ref 22–32)
Calcium: 9.6 mg/dL (ref 8.9–10.3)
Chloride: 108 mmol/L (ref 98–111)
Creatinine: 1.38 mg/dL — ABNORMAL HIGH (ref 0.61–1.24)
GFR, Estimated: 54 mL/min — ABNORMAL LOW (ref >60)
Glucose, Bld: 125 mg/dL — ABNORMAL HIGH (ref 70–99)
Potassium: 3.9 mmol/L (ref 3.5–5.1)
Sodium: 144 mmol/L (ref 135–145)
Total Bilirubin: 0.5 mg/dL (ref 0.3–1.2)
Total Protein: 7.2 g/dL (ref 6.5–8.1)

## 2022-04-27 LAB — LACTATE DEHYDROGENASE: LDH: 127 U/L (ref 98–192)

## 2022-04-28 LAB — KAPPA/LAMBDA LIGHT CHAINS
Kappa free light chain: 41.2 mg/L — ABNORMAL HIGH (ref 3.3–19.4)
Kappa, lambda light chain ratio: 2.33 — ABNORMAL HIGH (ref 0.26–1.65)
Lambda free light chains: 17.7 mg/L (ref 5.7–26.3)

## 2022-05-01 ENCOUNTER — Encounter: Payer: Self-pay | Admitting: Dietician

## 2022-05-01 ENCOUNTER — Ambulatory Visit: Payer: Medicare Other | Admitting: Dietician

## 2022-05-01 DIAGNOSIS — R7303 Prediabetes: Secondary | ICD-10-CM

## 2022-05-01 NOTE — Progress Notes (Signed)
On 05/01/2022 pt completed a post core session of the Diabetes Prevention Program course virtually with [facility]. By the end of this session patients are able to complete the following objectives:   Virtual Visit via Video Note  I connected with Calvin [name]. by a video enabled application and verified that I am speaking with the correct person using two identifiers.  Location: Patient: home Provider: office  Learning Objectives: Reflect on lifestyle changes they have made since starting the DPP.  Set long-term goals to promote continued maintenance of lifestyle changes made during the program.   Goals:  Work toward reaching new long-term goals set during class.   Follow-Up Plan: Contact Lifestyle Coach with questions/concerns PRN.

## 2022-05-02 NOTE — Progress Notes (Unsigned)
    Calvin [name] D.[place] Phone: [phone]   Assessment and Plan:     There are no diagnoses linked to this encounter.  ***   Pertinent previous records reviewed include ***   Follow Up: ***     Subjective:   I, Calvin [name], am serving as a Education administrator for Doctor Glennon Mac   Chief Complaint: right knee pain    HPI:    03/14/22 Patient is a 72 year old male complaining of right knee pain. Patient states that he dropped a weight on his quad , pain radiates down to his patellar tendon, voltaren gel helped , that was a few days ago, states he is still swollen, due to the swelling it felt like his joint  was ripping and separating when he was walking and then it hurt when he put the leg down , no numbness or tingling, states he was doing alright but when he dropped that weight he regressed   05/03/2022 Patient states    Relevant Historical Information: Gout, hypertension  Additional pertinent review of systems negative.   Current Outpatient Medications:    allopurinol (ZYLOPRIM) 300 MG tablet, TAKE 1 TABLET BY MOUTH DAILY, Disp: 90 tablet, Rfl: 3   aspirin 81 MG tablet, Take 81 mg by mouth daily., Disp: , Rfl:    fenofibrate 54 MG tablet, TAKE 1 TABLET BY MOUTH DAILY, Disp: 90 tablet, Rfl: 3   finasteride (PROSCAR) 5 MG tablet, TAKE 1 TABLET BY MOUTH  DAILY, Disp: 90 tablet, Rfl: 3   FLUZONE HIGH-DOSE QUADRIVALENT 0.7 ML SUSY, , Disp: , Rfl:    irbesartan (AVAPRO) 300 MG tablet, TAKE 1 TABLET BY MOUTH DAILY, Disp: 90 tablet, Rfl: 3   Multiple Vitamins-Minerals (CENTRUM MEN PO), Take 1 tablet by mouth daily., Disp: , Rfl:    rosuvastatin (CRESTOR) 20 MG tablet, TAKE 1 TABLET BY MOUTH DAILY, Disp: 90 tablet, Rfl: 3   SPIKEVAX syringe, , Disp: , Rfl:    tamsulosin (FLOMAX) 0.4 MG CAPS capsule, TAKE 1 CAPSULE BY MOUTH DAILY, Disp: 90 capsule, Rfl: 3   verapamil (CALAN-SR) 240 MG CR tablet, TAKE 1  TABLET BY MOUTH AT  BEDTIME, Disp: 90 tablet, Rfl: 3   Objective:     There were no vitals filed for this visit.    There is no height or weight on file to calculate BMI.    Physical Exam:    ***   Electronically signed by:  Calvin [name] D.[facility] 9:19 AM 05/02/22

## 2022-05-03 ENCOUNTER — Ambulatory Visit: Payer: Medicare Other | Admitting: Sports Medicine

## 2022-05-03 VITALS — BP 130/82 | HR 93 | Ht 67.0 in | Wt 249.0 lb

## 2022-05-03 DIAGNOSIS — M1711 Unilateral primary osteoarthritis, right knee: Secondary | ICD-10-CM | POA: Diagnosis not present

## 2022-05-03 DIAGNOSIS — M25561 Pain in right knee: Secondary | ICD-10-CM | POA: Diagnosis not present

## 2022-05-03 DIAGNOSIS — G8929 Other chronic pain: Secondary | ICD-10-CM

## 2022-05-03 LAB — MULTIPLE MYELOMA PANEL, SERUM
Albumin SerPl Elph-Mcnc: 3.9 g/dL (ref 2.9–4.4)
Albumin/Glob SerPl: 1.4 (ref 0.7–1.7)
Alpha 1: 0.2 g/dL (ref 0.0–0.4)
Alpha2 Glob SerPl Elph-Mcnc: 0.7 g/dL (ref 0.4–1.0)
B-Globulin SerPl Elph-Mcnc: 0.9 g/dL (ref 0.7–1.3)
Gamma Glob SerPl Elph-Mcnc: 1.2 g/dL (ref 0.4–1.8)
Globulin, Total: 3 g/dL (ref 2.2–3.9)
IgA: 188 mg/dL (ref 61–437)
IgG (Immunoglobin G), Serum: 1281 mg/dL (ref 603–1613)
IgM (Immunoglobulin M), Srm: 109 mg/dL (ref 15–143)
M Protein SerPl Elph-Mcnc: 0.2 g/dL — ABNORMAL HIGH
Total Protein ELP: 6.9 g/dL (ref 6.0–8.5)

## 2022-05-03 NOTE — Patient Instructions (Signed)
Good to see you   

## 2022-05-04 ENCOUNTER — Inpatient Hospital Stay: Payer: Medicare Other | Admitting: Hematology and Oncology

## 2022-05-04 VITALS — BP 146/86 | HR 67 | Temp 97.8°F | Resp 14 | Wt 248.9 lb

## 2022-05-04 DIAGNOSIS — D472 Monoclonal gammopathy: Secondary | ICD-10-CM | POA: Diagnosis not present

## 2022-05-04 DIAGNOSIS — Z87891 Personal history of nicotine dependence: Secondary | ICD-10-CM | POA: Diagnosis not present

## 2022-05-04 DIAGNOSIS — D72819 Decreased white blood cell count, unspecified: Secondary | ICD-10-CM | POA: Diagnosis not present

## 2022-05-04 DIAGNOSIS — D696 Thrombocytopenia, unspecified: Secondary | ICD-10-CM | POA: Diagnosis not present

## 2022-05-04 NOTE — Progress Notes (Signed)
[facility] [phone]   [phone]  PROGRESS NOTE  Patient Care Team: Nche, Charlene Brooke, NP as PCP - General (Internal Medicine) Franchot Gallo, MD as Consulting Physician (Urology) Netta Cedars, MD as Consulting Physician (Orthopedic Surgery) Germaine Pomfret, [facility] as Pharmacist (Pharmacist)  Hematological/Oncological History  # Normocytic Anemia 1) 05/29/2018: HB electrophoresis confirms sickle cell trait. Ferritin 18. Hgb 13.1. MMA 107 2) 01/22/2019: iron 112, TIBC 313, Sat 36%, ferritin 31. Hgb 12.6 3) 01/27/2019: establish care with Dr. Lorenso Courier. WBC 2.6, Hgb 12.6, Plt 121, MCV 91.4 4) 10/20/2019: WBC 3.0, Hgb 12.9, Plt 115, MCV 91   #IgG Kappa MGUS 1) 09/18/18:  M protein 0.2, kappa 38, lambda 17.6, ratio 2.2 2) 01/27/2019: establish care with Dr. Lorenso Courier  3) 10/20/2019: M protein 0.1, Kappa 48.3, Lambda 18, Ratio 2.68 4) 11/05/2020: M protein 0.3, Kappa 56, Lambda 19.2, Ratio 2.92 5) 04/28/2021: M protein 0.2, Kappa 58.9, Lambda 24.1, Ratio 2.44  #Leukopenia 1) Chronically low, with WBC 3.5 dating back to 03/24/2009 2) 2015-2020: stable at baseline WBC 3.5-5.0 3) 01/27/2019: establish care with Dr. Lorenso Courier.  WBC 2.6, Hgb 12.6, Plt 121, MCV 91.4 4) 10/20/2019: WBC 3.0, Hgb 12.9, Plt 115, MCV 91 5) 10/26/2021: WBC 2.3, Hgb 11.7, MCV 85, Plt 131  Interval History:  Calvin [name]. 72 y.o. male with medical history significant for MGUS presents for a follow up visit. He was last seen on 05/05/2021. In the interim since our last visit he has had no ED visits or hospitalizations.   On exam today Calvin [name] reports he has had no major changes in his health in the last 6 months.  He reports he did unfortunately have COVID recently and had a negative test starting 2 weeks ago.  He reports the infection was "not as bad as last time".  He notes that he is taking a multivitamin and trying to eat better as he recently started a prediabetes class.  He  notes that he has had no major changes but overall is just "trying to eat better".  He is not having any issues with nausea, vomiting, diarrhea, or shortness of breath.  He reports overall he feels quite well.  He does have some occasional joint pain but nothing that interferes with his day-to-day life.  Overall he has had no other major changes in his health. Full 10 point ROS is listed below.    MEDICAL HISTORY:  Past Medical History:  Diagnosis Date   Allergy    Arthritis    Degenerative TFCC tear, right 11/08/2017   Injected November 08, 2017   Diverticulosis    Gout    Hyperlipidemia    Hypertension    Iron deficiency anemia 09/20/2018   OSA (obstructive sleep apnea) 11/13/2013   Sickle cell trait ([place])    Sleep apnea    Urinary frequency 07/20/2012   Urinary retention 07/20/2012    SURGICAL HISTORY: Past Surgical History:  Procedure Laterality Date   COLONOSCOPY     POLYPECTOMY     ROTATOR CUFF REPAIR     left   THROAT SURGERY     polyps removed from vocal cord    SOCIAL HISTORY: Social History   Socioeconomic History   Marital status: Married    Spouse name: Not on file   Number of children: 1   Years of education: Not on file   Highest education level: Not on file  Occupational History   Occupation: retired  Tobacco Use  Smoking status: Former    Packs/day: 1.00    Years: 5.00    Total pack years: 5.00    Types: Cigarettes    Quit date: 05/23/1977    Years since quitting: 44.9   Smokeless tobacco: Never   Tobacco comments:    5pack years, no need for lung cancer screen  Vaping Use   Vaping Use: Never used  Substance and Sexual Activity   Alcohol use: No    Comment: QUIT 2002   Drug use: No    Comment: Completed program in april 2000   Sexual activity: Yes  Other Topics Concern   Not on file  Social History Narrative   Not on file   Social Determinants of Health   Financial Resource Strain: Low Risk  (12/13/2020)   Overall Financial Resource  Strain (CARDIA)    Difficulty of Paying Living Expenses: Not hard at all  Food Insecurity: No Food Insecurity (02/15/2021)   Hunger Vital Sign    Worried About Running Out of Food in the Last Year: Never true    Ran Out of Food in the Last Year: Never true  Transportation Needs: No Transportation Needs (02/15/2021)   PRAPARE - Hydrologist (Medical): No    Lack of Transportation (Non-Medical): No  Physical Activity: Insufficiently Active (02/15/2021)   Exercise Vital Sign    Days of Exercise per Week: 3 days    Minutes of Exercise per Session: 30 min  Stress: No Stress Concern Present (02/15/2021)   [place]    Feeling of Stress : Not at all  Social Connections: [place] (02/15/2021)   Social Connection and Isolation Panel [NHANES]    Frequency of Communication with Friends and Family: Twice a week    Frequency of Social Gatherings with Friends and Family: Twice a week    Attends Religious Services: More than 4 times per year    Active Member of [employer] or Organizations: Yes    Attends Music therapist: More than 4 times per year    Marital Status: Married  Human resources officer Violence: Not At Risk (02/15/2021)   Humiliation, Afraid, Rape, and Kick questionnaire    Fear of Current or Ex-Partner: No    Emotionally Abused: No    Physically Abused: No    Sexually Abused: No    FAMILY HISTORY: Family History  Problem Relation Age of Onset   Colon cancer Neg Hx    Esophageal cancer Neg Hx    Rectal cancer Neg Hx    Stomach cancer Neg Hx     ALLERGIES:  has No Known Allergies.  MEDICATIONS:  Current Outpatient Medications  Medication Sig Dispense Refill   allopurinol (ZYLOPRIM) 300 MG tablet TAKE 1 TABLET BY MOUTH DAILY 90 tablet 3   aspirin 81 MG tablet Take 81 mg by mouth daily.     fenofibrate 54 MG tablet TAKE 1 TABLET BY MOUTH DAILY 90 tablet 3   finasteride  (PROSCAR) 5 MG tablet TAKE 1 TABLET BY MOUTH  DAILY 90 tablet 3   FLUZONE HIGH-DOSE QUADRIVALENT 0.7 ML SUSY      irbesartan (AVAPRO) 300 MG tablet TAKE 1 TABLET BY MOUTH DAILY 90 tablet 3   Multiple Vitamins-Minerals (CENTRUM MEN PO) Take 1 tablet by mouth daily.     rosuvastatin (CRESTOR) 20 MG tablet TAKE 1 TABLET BY MOUTH DAILY 90 tablet 3   SPIKEVAX syringe      tamsulosin (  FLOMAX) 0.4 MG CAPS capsule TAKE 1 CAPSULE BY MOUTH DAILY 90 capsule 3   verapamil (CALAN-SR) 240 MG CR tablet TAKE 1 TABLET BY MOUTH AT  BEDTIME 90 tablet 3   No current facility-administered medications for this visit.    REVIEW OF SYSTEMS:   Constitutional: ( - ) fevers, ( - )  chills , ( - ) night sweats Eyes: ( - ) blurriness of vision, ( - ) double vision, ( - ) watery eyes Ears, nose, mouth, throat, and face: ( - ) mucositis, ( - ) sore throat Respiratory: ( - ) cough, ( - ) dyspnea, ( - ) wheezes Cardiovascular: ( - ) palpitation, ( - ) chest discomfort, ( - ) lower extremity swelling Gastrointestinal:  ( - ) nausea, ( - ) heartburn, ( - ) change in bowel habits Skin: ( - ) abnormal skin rashes Lymphatics: ( - ) new lymphadenopathy, ( - ) easy bruising Neurological: ( - ) numbness, ( - ) tingling, ( - ) new weaknesses Behavioral/Psych: ( - ) mood change, ( - ) new changes  All other systems were reviewed with the patient and are negative.  PHYSICAL EXAMINATION: ECOG PERFORMANCE STATUS: 1 - Symptomatic but completely ambulatory  Vitals:   05/04/22 1103  BP: (!) 146/86  Pulse: 67  Resp: 14  Temp: 97.8 F (36.6 C)  SpO2: 97%   Filed Weights   05/04/22 1103  Weight: 248 lb 14.4 oz (112.9 kg)    GENERAL: well appearing African American male in NAD  SKIN: skin color, texture, turgor are normal, no rashes or significant lesions EYES: conjunctiva are pink and non-injected, sclera clear LUNGS: clear to auscultation and percussion with normal breathing effort HEART: regular rate & rhythm and no  murmurs and no lower extremity edema Musculoskeletal: no cyanosis of digits and no clubbing  PSYCH: alert & oriented x 3, fluent speech NEURO: no focal motor/sensory deficits  LABORATORY DATA:  I have reviewed the data as listed    Latest Ref Rng & Units 04/27/2022   10:31 AM 01/25/2022   10:38 AM 10/26/2021   10:44 AM  CBC  WBC 4.0 - 10.5 K/uL 5.0  3.2  2.3   Hemoglobin 13.0 - 17.0 g/dL 13.8  12.3  11.7   Hematocrit 39.0 - 52.0 % 39.2  35.9  34.1   Platelets 150 - 400 K/uL 181  127  131       Latest Ref Rng & Units 04/27/2022   10:31 AM 01/25/2022   10:38 AM 10/26/2021   10:44 AM  CMP  Glucose 70 - 99 mg/dL 125  115  88   BUN 8 - 23 mg/dL 26  24  17   $ Creatinine 0.61 - 1.24 mg/dL 1.38  1.49  1.48   Sodium 135 - 145 mmol/L 144  143  141   Potassium 3.5 - 5.1 mmol/L 3.9  4.0  3.8   Chloride 98 - 111 mmol/L 108  110  110   CO2 22 - 32 mmol/L 27  29  28   $ Calcium 8.9 - 10.3 mg/dL 9.6  9.3  9.3   Total Protein 6.5 - 8.1 g/dL 7.2  7.6  7.6   Total Bilirubin 0.3 - 1.2 mg/dL 0.5  0.5  0.6   Alkaline Phos 38 - 126 U/L 43  46  49   AST 15 - 41 U/L 17  21  28   $ ALT 0 - 44 U/L 13  12  16  Lab Results  Component Value Date   KPAFRELGTCHN 41.2 (H) 04/27/2022   KPAFRELGTCHN 50.4 (H) 01/25/2022   KPAFRELGTCHN 52.3 (H) 10/26/2021   LAMBDASER 17.7 04/27/2022   LAMBDASER 17.1 01/25/2022   LAMBDASER 20.5 10/26/2021   KAPLAMBRATIO 2.33 (H) 04/27/2022   KAPLAMBRATIO 2.95 (H) 01/25/2022   KAPLAMBRATIO 2.55 (H) 10/26/2021    Lab Results  Component Value Date   MPROTEIN 0.2 (H) 04/27/2022   MPROTEIN 0.3 (H) 01/25/2022   MPROTEIN 0.3 (H) 10/26/2021     RADIOGRAPHIC STUDIES: None to review  ASSESSMENT & PLAN Calvin [name]. 72 y.o. male with medical history significant for MGUS presents for a follow up visit.  Review of Calvin [name] labs appear stable.    Given the stability of his reported health and the stability of his labs I think we can safely follow-up the patient  approximately 6 months time.  At this juncture there is no clear indication for a bone marrow biopsy though I have would have a low threshold to perform this if any worsening abnormalities were noted in his blood.  We will plan to see the patient back in approximately 6 months however if he were to have any new symptoms in the interim we recommend that he call our clinic and we can always see him sooner.  #Normocytic Anemia, stable --HB electrophoresis confirms sickle cell trait, though he has had normal baseline Hgb before in the past.  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 He may have developed low iron stores due to this generosity. He last donated in 2018. His counts appear stable at this time. Iron stores appear replete as of 04/30/2019.  --RTC in 6 months for further review.   #IgG Kappa Monoclonal Gammopathy of Undetermined Significance, stable  --last SPEP/SFLC showed stable labs --will repeat SPEP and SFLC 1 week prior to his next visit.   --patient will require a bone survey at least yearly.  --continue to monitor at least q 3month.   #Leukopenia, stable --low WBC dating back to 2011, stably low around 3.5-.5.0.  --WBC 5.0 --05/29/2018: negative for HIV, Hep C, but had Hep B core antibody, no surface antigen or surface antibody.  --continue to monitor   #Thrombocytopenia-improved --chronic fluctuating thrombocytopenia. There does appear to be a component of clumping --Plt 181 on 10/26/2021, though typically ranges from 100-150 --CT scan of abdomen in March 2020 showed no evidence of cirrhosis. --continue to monitor   #Suspected Arrhythmia --on ascultation of heart I thought I detected a slight arrhythmia at prior visit. --ordered EKG to confirm. No abnormalities on EKG --no issue, continued to monitor  No orders of the defined types were placed in this encounter.  All questions were answered. The patient knows to call the  clinic with any problems, questions or concerns.  A total of more than 25 minutes were spent face-to-face with the patient during this encounter and over half of that time was spent on counseling and coordination of care as outlined above.   JLedell Peoples MD Department of Hematology/On[facility]Phone: [phone]: [phone]: [email or web address]  05/04/2022 3:55 PM

## 2022-06-09 DIAGNOSIS — G4733 Obstructive sleep apnea (adult) (pediatric): Secondary | ICD-10-CM | POA: Diagnosis not present

## 2022-06-13 DIAGNOSIS — L720 Epidermal cyst: Secondary | ICD-10-CM | POA: Diagnosis not present

## 2022-06-13 DIAGNOSIS — L821 Other seborrheic keratosis: Secondary | ICD-10-CM | POA: Diagnosis not present

## 2022-06-13 DIAGNOSIS — L853 Xerosis cutis: Secondary | ICD-10-CM | POA: Diagnosis not present

## 2022-06-13 DIAGNOSIS — L738 Other specified follicular disorders: Secondary | ICD-10-CM | POA: Diagnosis not present

## 2022-06-25 ENCOUNTER — Other Ambulatory Visit: Payer: Self-pay | Admitting: Nurse Practitioner

## 2022-06-25 DIAGNOSIS — N401 Enlarged prostate with lower urinary tract symptoms: Secondary | ICD-10-CM

## 2022-06-26 ENCOUNTER — Ambulatory Visit (HOSPITAL_BASED_OUTPATIENT_CLINIC_OR_DEPARTMENT_OTHER): Payer: Medicare Other | Admitting: Pulmonary Disease

## 2022-06-26 ENCOUNTER — Encounter (HOSPITAL_BASED_OUTPATIENT_CLINIC_OR_DEPARTMENT_OTHER): Payer: Self-pay | Admitting: Pulmonary Disease

## 2022-06-26 VITALS — BP 134/84 | HR 69 | Temp 97.7°F | Ht 67.0 in | Wt 255.7 lb

## 2022-06-26 DIAGNOSIS — G4733 Obstructive sleep apnea (adult) (pediatric): Secondary | ICD-10-CM

## 2022-06-26 NOTE — Progress Notes (Signed)
[facility]  Chief Complaint  Patient presents with   Follow-up    Follow up. Patient has no complaints.     Past Surgical History:  Calvin [name]  has a past surgical history that includes Rotator cuff repair; Throat surgery; Polypectomy; and Colonoscopy.  Past Medical History:  Sickle Cell Trait, HTN, HLD, Gout, Diverticulosis, OA, Allergies  Constitutional:  BP 134/84 (BP Location: Left Arm, Patient Position: Sitting, Cuff Size: Normal)   Pulse 69   Temp 97.7 F (36.5 C) (Oral)   Ht  (1.702 m)   Wt 255 lb 11.7 oz (116 kg)   SpO2 100%   BMI 40.05 kg/m   Brief Summary:  Calvin Slaughter. is a 72 y.o. male with obstructive sleep apnea.      Subjective:   Calvin [name] uses CPAP nightly.  Has a nasal mask.  No issues with mask fit or pressure setting.  Goes to bed at 10 pm.  Falls asleep easily.  Uses bathroom 2 or 3 times per night.  Gets up at 7 am.  Feels rested.  Doesn't usually need to take naps.  Physical Exam:   Appearance - well kempt   ENMT - no sinus tenderness, no oral exudate, no LAN, Mallampati 3 airway, no stridor  Respiratory - equal breath sounds bilaterally, no wheezing or rales  CV - s1s2 regular rate and rhythm, no murmurs  Ext - no clubbing, no edema  Skin - no rashes  Psych - normal mood and affect   Sleep Tests:  HST 12/29/13 >> AHI 62.8, SaO2 low 71%. Auto CPAP 03/23/21 to 04/21/21 >> used on 30 of 30 nights with average 8 hrs 11 min.  Average AHI 1.7 with median CPAP 7 and 95 th percentile CPAP 10 cm H2O  Social History:  Calvin [name]  reports that Calvin [name] quit smoking about 45 years ago. His smoking use included cigarettes. Calvin [name] has a 5.00 pack-year smoking history. Calvin [name] has never used smokeless tobacco. Calvin [name] reports that Calvin [name] does not drink alcohol and does not use drugs.  Family History:  His family history is not on file.     Assessment/Plan:   Obstructive sleep apnea. - Calvin [name] is compliant with CPAP and reports benefit  from therapy - Calvin [name] uses [facility] for his DME - current CPAP ordered January 2022 - continue auto CPAP 5 to 20 cm H2O  Time Spent Involved in Patient Care on Day of Examination:  16 minutes  Follow up:   Patient Instructions  Follow up in 1 year  Medication List:   Allergies as of 06/26/2022   No Known Allergies      Medication List        Accurate as of June 26, 2022 11:50 AM. If you have any questions, ask your nurse or doctor.          allopurinol 300 MG tablet Commonly known as: ZYLOPRIM TAKE 1 TABLET BY MOUTH DAILY   aspirin 81 MG tablet Take 81 mg by mouth daily.   CENTRUM MEN PO Take 1 tablet by mouth daily.   fenofibrate 54 MG tablet TAKE 1 TABLET BY MOUTH DAILY   finasteride 5 MG tablet Commonly known as: PROSCAR TAKE 1 TABLET BY MOUTH  DAILY   Fluzone High-Dose Quadrivalent 0.7 ML Susy Generic drug: Influenza Vac High-Dose Quad   irbesartan 300 MG tablet Commonly known as: AVAPRO TAKE 1 TABLET BY MOUTH DAILY   rosuvastatin 20 MG tablet Commonly known as: CRESTOR  TAKE 1 TABLET BY MOUTH DAILY   Spikevax syringe Generic drug: COVID-19 mRNA vaccine 2023-2024   tamsulosin 0.4 MG Caps capsule Commonly known as: FLOMAX TAKE 1 CAPSULE BY MOUTH DAILY   verapamil 240 MG CR tablet Commonly known as: CALAN-SR TAKE 1 TABLET BY MOUTH AT  BEDTIME        Signature:  Coralyn Helling, MD [facility] Pager - [phone] 06/26/2022, 11:50 AM

## 2022-06-26 NOTE — Patient Instructions (Signed)
Follow up in 1 year.

## 2022-07-05 ENCOUNTER — Other Ambulatory Visit: Payer: Self-pay | Admitting: Nurse Practitioner

## 2022-07-05 DIAGNOSIS — N401 Enlarged prostate with lower urinary tract symptoms: Secondary | ICD-10-CM

## 2022-08-03 ENCOUNTER — Other Ambulatory Visit: Payer: Self-pay | Admitting: Nurse Practitioner

## 2022-08-03 DIAGNOSIS — I10 Essential (primary) hypertension: Secondary | ICD-10-CM

## 2022-08-03 DIAGNOSIS — M1A0620 Idiopathic chronic gout, left knee, without tophus (tophi): Secondary | ICD-10-CM

## 2022-08-03 DIAGNOSIS — E782 Mixed hyperlipidemia: Secondary | ICD-10-CM

## 2022-08-28 ENCOUNTER — Encounter: Payer: Self-pay | Admitting: Nurse Practitioner

## 2022-08-28 ENCOUNTER — Ambulatory Visit (INDEPENDENT_AMBULATORY_CARE_PROVIDER_SITE_OTHER): Payer: Medicare Other | Admitting: Nurse Practitioner

## 2022-08-28 VITALS — BP 134/86 | HR 86 | Temp 97.8°F | Resp 16 | Ht 67.0 in | Wt 256.4 lb

## 2022-08-28 DIAGNOSIS — Z6841 Body Mass Index (BMI) 40.0 and over, adult: Secondary | ICD-10-CM

## 2022-08-28 DIAGNOSIS — M1A0620 Idiopathic chronic gout, left knee, without tophus (tophi): Secondary | ICD-10-CM

## 2022-08-28 DIAGNOSIS — R739 Hyperglycemia, unspecified: Secondary | ICD-10-CM

## 2022-08-28 DIAGNOSIS — E782 Mixed hyperlipidemia: Secondary | ICD-10-CM | POA: Diagnosis not present

## 2022-08-28 DIAGNOSIS — N183 Chronic kidney disease, stage 3 unspecified: Secondary | ICD-10-CM | POA: Insufficient documentation

## 2022-08-28 DIAGNOSIS — N1831 Chronic kidney disease, stage 3a: Secondary | ICD-10-CM | POA: Diagnosis not present

## 2022-08-28 LAB — LIPID PANEL
Cholesterol: 103 mg/dL (ref 0–200)
HDL: 24.6 mg/dL — ABNORMAL LOW (ref >39.00)
LDL Cholesterol: 65 mg/dL (ref 0–99)
NonHDL: 78.41
Total CHOL/HDL Ratio: 4
Triglycerides: 69 mg/dL (ref 0.0–149.0)
VLDL: 13.8 mg/dL (ref 0.0–40.0)

## 2022-08-28 LAB — HEMOGLOBIN A1C: Hgb A1c MFr Bld: 5.4 % (ref 4.6–6.5)

## 2022-08-28 LAB — URIC ACID: Uric Acid, Serum: 4.8 mg/dL (ref 4.0–7.8)

## 2022-08-28 NOTE — Progress Notes (Signed)
Established Patient Visit  Patient: Calvin [name].   DOB: 09-19-1950   72 y.o. Male  MRN: [id number] Visit Date: 08/28/2022  Subjective:    Chief Complaint  Patient presents with   Diabetes   Hypertension    Fasting - Had a banana and orange    GOUT No acute gout exacerbation Current use of allopurinol Repeat uric acid Stable stage 3 CKD  CKD (chronic kidney disease) stage 3, GFR 30-59 ml/min (HCC) Stable Estimated creatinine clearance: 77.46ml/min  Hyperglycemia Repeat hgbA1c  Mixed hyperlipidemia No adverse effects with crestor and fenofibrate Repeat lipid panel  Reviewed medical, surgical, and social history today  Medications: Outpatient Medications Prior to Visit  Medication Sig   allopurinol (ZYLOPRIM) 300 MG tablet TAKE 1 TABLET BY MOUTH DAILY   aspirin 81 MG tablet Take 81 mg by mouth daily.   fenofibrate 54 MG tablet TAKE 1 TABLET BY MOUTH DAILY   finasteride (PROSCAR) 5 MG tablet TAKE 1 TABLET BY MOUTH DAILY   FLUZONE HIGH-DOSE QUADRIVALENT 0.7 ML SUSY    irbesartan (AVAPRO) 300 MG tablet TAKE 1 TABLET BY MOUTH DAILY   Multiple Vitamins-Minerals (CENTRUM MEN PO) Take 1 tablet by mouth daily.   rosuvastatin (CRESTOR) 20 MG tablet TAKE 1 TABLET BY MOUTH DAILY   SPIKEVAX syringe    tamsulosin (FLOMAX) 0.4 MG CAPS capsule TAKE 1 CAPSULE BY MOUTH DAILY   verapamil (CALAN-SR) 240 MG CR tablet TAKE 1 TABLET BY MOUTH AT  BEDTIME   No facility-administered medications prior to visit.   Reviewed past medical and social history.   ROS per HPI above      Objective:  BP 134/86 (BP Location: Left Arm, Patient Position: Sitting, Cuff Size: Large)   Pulse 86   Temp 97.8 F (36.6 C) (Temporal)   Resp 16   Ht 5\' 7"  (1.702 m)   Wt 256 lb 6.4 oz (116.3 kg)   SpO2 97%   BMI 40.16 kg/m      Physical Exam Cardiovascular:     Rate and Rhythm: Normal rate and regular rhythm.     Pulses: Normal pulses.     Heart sounds: Normal heart sounds.   Pulmonary:     Effort: Pulmonary effort is normal.     Breath sounds: Normal breath sounds.  Musculoskeletal:     Right lower leg: No edema.  Neurological:     Mental Status: He is alert and oriented to person, place, and time.     No results found for any visits on 08/28/22.    Assessment & Plan:    Problem List Items Addressed This Visit       Genitourinary   CKD (chronic kidney disease) stage 3, GFR 30-59 ml/min (HCC)    Stable Estimated creatinine clearance: 77.83ml/min        Other   GOUT    No acute gout exacerbation Current use of allopurinol Repeat uric acid Stable stage 3 CKD      Relevant Orders   Uric acid   Hyperglycemia    Repeat hgbA1c      Relevant Orders   Hemoglobin A1c   Mixed hyperlipidemia - Primary    No adverse effects with crestor and fenofibrate Repeat lipid panel      Relevant Orders   Lipid panel   Obesity   Return in about 6 months (around 02/27/2023) for HTN, hyperlipidemia (fasting).     Claris Gower  Gavino Fouch, NP

## 2022-08-28 NOTE — Assessment & Plan Note (Signed)
No acute gout exacerbation Current use of allopurinol Repeat uric acid Stable stage 3 CKD

## 2022-08-28 NOTE — Assessment & Plan Note (Signed)
Repeat hgbA1c 

## 2022-08-28 NOTE — Patient Instructions (Signed)
Resume heart healthy diet and daily exercise Go to lab Maintain current medications

## 2022-08-28 NOTE — Assessment & Plan Note (Signed)
No adverse effects with crestor and fenofibrate Repeat lipid panel

## 2022-08-28 NOTE — Assessment & Plan Note (Addendum)
Stable Estimated creatinine clearance: 77.65ml/min

## 2022-09-05 DIAGNOSIS — G4733 Obstructive sleep apnea (adult) (pediatric): Secondary | ICD-10-CM | POA: Diagnosis not present

## 2022-10-02 DIAGNOSIS — G4733 Obstructive sleep apnea (adult) (pediatric): Secondary | ICD-10-CM | POA: Diagnosis not present

## 2022-10-09 DIAGNOSIS — H524 Presbyopia: Secondary | ICD-10-CM | POA: Diagnosis not present

## 2022-11-02 ENCOUNTER — Other Ambulatory Visit: Payer: Self-pay | Admitting: Hematology and Oncology

## 2022-11-02 ENCOUNTER — Inpatient Hospital Stay: Payer: Medicare Other | Attending: Hematology and Oncology

## 2022-11-02 DIAGNOSIS — D472 Monoclonal gammopathy: Secondary | ICD-10-CM | POA: Insufficient documentation

## 2022-11-02 LAB — CBC WITH DIFFERENTIAL (CANCER CENTER ONLY)
Abs Immature Granulocytes: 0 10*3/uL (ref 0.00–0.07)
Basophils Absolute: 0 10*3/uL (ref 0.0–0.1)
Basophils Relative: 0 %
Eosinophils Absolute: 0 10*3/uL (ref 0.0–0.5)
Eosinophils Relative: 1 %
HCT: 38.1 % — ABNORMAL LOW (ref 39.0–52.0)
Hemoglobin: 12.9 g/dL — ABNORMAL LOW (ref 13.0–17.0)
Immature Granulocytes: 0 %
Lymphocytes Relative: 40 %
Lymphs Abs: 1.1 10*3/uL (ref 0.7–4.0)
MCH: 30.9 pg (ref 26.0–34.0)
MCHC: 33.9 g/dL (ref 30.0–36.0)
MCV: 91.1 fL (ref 80.0–100.0)
Monocytes Absolute: 0.2 10*3/uL (ref 0.1–1.0)
Monocytes Relative: 7 %
Neutro Abs: 1.4 10*3/uL — ABNORMAL LOW (ref 1.7–7.7)
Neutrophils Relative %: 52 %
Platelet Count: 126 10*3/uL — ABNORMAL LOW (ref 150–400)
RBC: 4.18 MIL/uL — ABNORMAL LOW (ref 4.22–5.81)
RDW: 13.1 % (ref 11.5–15.5)
WBC Count: 2.8 10*3/uL — ABNORMAL LOW (ref 4.0–10.5)
nRBC: 0 % (ref 0.0–0.2)

## 2022-11-02 LAB — CMP (CANCER CENTER ONLY)
ALT: 22 U/L (ref 0–44)
AST: 34 U/L (ref 15–41)
Albumin: 4.1 g/dL (ref 3.5–5.0)
Alkaline Phosphatase: 40 U/L (ref 38–126)
Anion gap: 8 (ref 5–15)
BUN: 17 mg/dL (ref 8–23)
CO2: 26 mmol/L (ref 22–32)
Calcium: 9.2 mg/dL (ref 8.9–10.3)
Chloride: 109 mmol/L (ref 98–111)
Creatinine: 1.39 mg/dL — ABNORMAL HIGH (ref 0.61–1.24)
GFR, Estimated: 53 mL/min — ABNORMAL LOW (ref >60)
Glucose, Bld: 101 mg/dL — ABNORMAL HIGH (ref 70–99)
Potassium: 3.6 mmol/L (ref 3.5–5.1)
Sodium: 143 mmol/L (ref 135–145)
Total Bilirubin: 0.9 mg/dL (ref 0.3–1.2)
Total Protein: 7.5 g/dL (ref 6.5–8.1)

## 2022-11-02 LAB — LACTATE DEHYDROGENASE: LDH: 141 U/L (ref 98–192)

## 2022-11-03 LAB — KAPPA/LAMBDA LIGHT CHAINS
Kappa free light chain: 50.8 mg/L — ABNORMAL HIGH (ref 3.3–19.4)
Kappa, lambda light chain ratio: 2.54 — ABNORMAL HIGH (ref 0.26–1.65)
Lambda free light chains: 20 mg/L (ref 5.7–26.3)

## 2022-11-06 ENCOUNTER — Other Ambulatory Visit: Payer: Self-pay | Admitting: Nurse Practitioner

## 2022-11-06 DIAGNOSIS — E782 Mixed hyperlipidemia: Secondary | ICD-10-CM

## 2022-11-07 LAB — MULTIPLE MYELOMA PANEL, SERUM
Albumin SerPl Elph-Mcnc: 3.9 g/dL (ref 2.9–4.4)
Albumin/Glob SerPl: 1.3 (ref 0.7–1.7)
Alpha 1: 0.2 g/dL (ref 0.0–0.4)
Alpha2 Glob SerPl Elph-Mcnc: 0.6 g/dL (ref 0.4–1.0)
B-Globulin SerPl Elph-Mcnc: 1 g/dL (ref 0.7–1.3)
Gamma Glob SerPl Elph-Mcnc: 1.3 g/dL (ref 0.4–1.8)
Globulin, Total: 3.1 g/dL (ref 2.2–3.9)
IgA: 158 mg/dL (ref 61–437)
IgG (Immunoglobin G), Serum: 1289 mg/dL (ref 603–1613)
IgM (Immunoglobulin M), Srm: 108 mg/dL (ref 15–143)
M Protein SerPl Elph-Mcnc: 0.3 g/dL — ABNORMAL HIGH
Total Protein ELP: 7 g/dL (ref 6.0–8.5)

## 2022-11-09 ENCOUNTER — Telehealth: Payer: Self-pay

## 2022-11-09 NOTE — Telephone Encounter (Addendum)
LVM for pt regarding lab work. Instructed pt to please call back to go over the results. Call back number provided.

## 2022-11-16 ENCOUNTER — Telehealth: Payer: Self-pay

## 2022-11-16 NOTE — Telephone Encounter (Signed)
-----   Message from Nurse Lanora Manis T sent at 11/09/2022  1:01 PM EDT -----  ----- Message ----- From: Jaci Standard, MD Sent: 11/07/2022   8:10 PM EDT To: Kyra Searles, RN  Please let Mr. Kreutzer know that the results of his bloodwork show his levels are stable. We will plan to see him back in Feb 2025 ----- Message ----- From: Leory Plowman, [facility] Sent: 11/02/2022  10:51 AM EDT To: Jaci Standard, MD

## 2022-11-16 NOTE — Telephone Encounter (Signed)
Pt LVM regarding a missed call from this office. This RN attempted to call patient back regarding the call to discuss lab results. This RN LVM with the call back number [phone].

## 2022-11-27 ENCOUNTER — Ambulatory Visit (INDEPENDENT_AMBULATORY_CARE_PROVIDER_SITE_OTHER): Payer: Medicare Other | Admitting: Nurse Practitioner

## 2022-11-27 ENCOUNTER — Encounter: Payer: Self-pay | Admitting: Nurse Practitioner

## 2022-11-27 VITALS — BP 140/80 | HR 70 | Temp 97.5°F | Ht 67.0 in | Wt 256.4 lb

## 2022-11-27 DIAGNOSIS — I10 Essential (primary) hypertension: Secondary | ICD-10-CM

## 2022-11-27 DIAGNOSIS — N1831 Chronic kidney disease, stage 3a: Secondary | ICD-10-CM

## 2022-11-27 DIAGNOSIS — Z23 Encounter for immunization: Secondary | ICD-10-CM

## 2022-11-27 MED ORDER — SPIRONOLACTONE 25 MG PO TABS
12.5000 mg | ORAL_TABLET | Freq: Every morning | ORAL | 5 refills | Status: DC
Start: 2022-11-27 — End: 2023-04-20

## 2022-11-27 NOTE — Progress Notes (Signed)
Established Patient Visit  Patient: Calvin [name].   DOB: Jul 16, 1950   72 y.o. Male  MRN: [id number] Visit Date: 11/27/2022  Subjective:    Chief Complaint  Patient presents with   Hypertension    Follow up on BP. Pt would like a flu vaccine.   Hypertension   Essential hypertension Home BP: 149/89, 160/90 Reports compliance with low sodium diet, use of CPAP, and med doses (verapermil 240mg  and irbesartan 300mg ) BP Readings from Last 3 Encounters:  11/27/22 (!) 140/80  08/28/22 134/86  06/26/22 134/84    Maintain about med doses Add spironolactone 12.5mg  in AM Repeat BMP in 2weeks Advised to bring BP machine to next appointment. F/up in 23month   BP Readings from Last 3 Encounters:  11/27/22 (!) 140/80  08/28/22 134/86  06/26/22 134/84    Reviewed medical, surgical, and social history today  Medications: Outpatient Medications Prior to Visit  Medication Sig   acetaminophen (TYLENOL) 325 MG tablet Take 650 mg by mouth every 6 (six) hours as needed.   allopurinol (ZYLOPRIM) 300 MG tablet TAKE 1 TABLET BY MOUTH DAILY   aspirin 81 MG tablet Take 81 mg by mouth daily.   diclofenac Sodium (VOLTAREN) 1 % GEL Apply topically 4 (four) times daily. Pt uses as needed.   fenofibrate 54 MG tablet TAKE 1 TABLET BY MOUTH DAILY   finasteride (PROSCAR) 5 MG tablet TAKE 1 TABLET BY MOUTH DAILY   FLUZONE HIGH-DOSE QUADRIVALENT 0.7 ML SUSY    irbesartan (AVAPRO) 300 MG tablet TAKE 1 TABLET BY MOUTH DAILY   Multiple Vitamins-Minerals (CENTRUM MEN PO) Take 1 tablet by mouth daily.   rosuvastatin (CRESTOR) 20 MG tablet TAKE 1 TABLET BY MOUTH DAILY   SPIKEVAX syringe    tamsulosin (FLOMAX) 0.4 MG CAPS capsule TAKE 1 CAPSULE BY MOUTH DAILY   verapamil (CALAN-SR) 240 MG CR tablet TAKE 1 TABLET BY MOUTH AT  BEDTIME   No facility-administered medications prior to visit.   Reviewed past medical and social history.   ROS per HPI above      Objective:  BP (!) 140/80    Pulse 70   Temp (!) 97.5 F (36.4 C) (Temporal)   Ht 5\' 7"  (1.702 m)   Wt 256 lb 6.4 oz (116.3 kg)   SpO2 98%   BMI 40.16 kg/m      Physical Exam Cardiovascular:     Rate and Rhythm: Normal rate and regular rhythm.     Pulses: Normal pulses.     Heart sounds: Normal heart sounds.  Pulmonary:     Effort: Pulmonary effort is normal.     Breath sounds: Normal breath sounds.  Musculoskeletal:     Right lower leg: No edema.     Left lower leg: No edema.  Neurological:     Mental Status: He is alert and oriented to person, place, and time.     No results found for any visits on 11/27/22.    Assessment & Plan:    Problem List Items Addressed This Visit     CKD (chronic kidney disease) stage 3, GFR 30-59 ml/min (HCC)   Relevant Orders   Microalbumin / creatinine urine ratio   Renal Function Panel   Essential hypertension - Primary    Home BP: 149/89, 160/90 Reports compliance with low sodium diet, use of CPAP, and med doses (verapermil 240mg  and irbesartan 300mg ) BP Readings from Last 3  Encounters:  11/27/22 (!) 140/80  08/28/22 134/86  06/26/22 134/84    Maintain about med doses Add spironolactone 12.5mg  in AM Repeat BMP in 2weeks Advised to bring BP machine to next appointment. F/up in 57month      Relevant Medications   spironolactone (ALDACTONE) 25 MG tablet   Other Relevant Orders   Renal Function Panel   Other Visit Diagnoses     Immunization due       Relevant Orders   Flu Vaccine Trivalent High Dose (Fluad) (Completed)      Return in about 4 weeks (around 12/25/2022) for HTN.     Alysia Penna, NP

## 2022-11-27 NOTE — Assessment & Plan Note (Signed)
Home BP: 149/89, 160/90 Reports compliance with low sodium diet, use of CPAP, and med doses (verapermil 240mg  and irbesartan 300mg ) BP Readings from Last 3 Encounters:  11/27/22 (!) 140/80  08/28/22 134/86  06/26/22 134/84    Maintain about med doses Add spironolactone 12.5mg  in AM Repeat BMP in 2weeks Advised to bring BP machine to next appointment. F/up in 108month

## 2022-11-27 NOTE — Patient Instructions (Signed)
Add spironolactone 12.5mg  in AM Maintain other med doses Schedule lab appointment to repeat labs in 2weeks Maintain low salt diet Use BP machine with large cuff for BP check at home F/up with me in 97month

## 2022-12-08 ENCOUNTER — Telehealth: Payer: Self-pay

## 2022-12-08 NOTE — Progress Notes (Unsigned)
12/08/2022  Patient ID: Calvin [name]., male   DOB: 1950/07/29, 72 y.o.   MRN: [id number]  Pharmacy Quality Measure Review  This patient is appearing on a report for being at risk of failing the Controlling Blood Pressure measure this calendar year.   Last documented BP 140/80 on 9/16

## 2022-12-11 ENCOUNTER — Other Ambulatory Visit (INDEPENDENT_AMBULATORY_CARE_PROVIDER_SITE_OTHER): Payer: Medicare Other

## 2022-12-11 ENCOUNTER — Other Ambulatory Visit: Payer: Medicare Other

## 2022-12-11 DIAGNOSIS — N1831 Chronic kidney disease, stage 3a: Secondary | ICD-10-CM | POA: Diagnosis not present

## 2022-12-11 DIAGNOSIS — I10 Essential (primary) hypertension: Secondary | ICD-10-CM | POA: Diagnosis not present

## 2022-12-11 LAB — RENAL FUNCTION PANEL
Albumin: 4.3 g/dL (ref 3.5–5.2)
BUN: 23 mg/dL (ref 6–23)
CO2: 25 meq/L (ref 19–32)
Calcium: 9.1 mg/dL (ref 8.4–10.5)
Chloride: 108 meq/L (ref 96–112)
Creatinine, Ser: 1.57 mg/dL — ABNORMAL HIGH (ref 0.40–1.50)
GFR: 43.21 mL/min — ABNORMAL LOW (ref >60.00)
Glucose, Bld: 125 mg/dL — ABNORMAL HIGH (ref 70–99)
Phosphorus: 3.1 mg/dL (ref 2.3–4.6)
Potassium: 4.2 meq/L (ref 3.5–5.1)
Sodium: 143 meq/L (ref 135–145)

## 2022-12-11 LAB — MICROALBUMIN / CREATININE URINE RATIO
Creatinine,U: 129.7 mg/dL
Microalb Creat Ratio: 0.5 mg/g (ref 0.0–30.0)
Microalb, Ur: 0.7 mg/dL (ref 0.0–1.9)

## 2022-12-11 NOTE — Addendum Note (Signed)
Addended by: Michaela Corner on: 12/11/2022 02:34 PM   Modules accepted: Orders

## 2022-12-11 NOTE — Progress Notes (Signed)
12/11/2022  Patient ID: Calvin [name]., male   DOB: 22-Feb-1951, 72 y.o.   MRN: [id number]  Pharmacy Quality Measure Review  This patient is appearing on a report for being at risk of failing the Controlling Blood Pressure measure this calendar year.   Last documented BP 140/80 on 9/16  -Telephone visit to obtain BP readings since spironolactone 12.5mg  initiated and patient obtained new automated, upper-arm BP monitor -Recent readings include:  Friday- 125/79, Saturday- 130/78, Sunday-120/70, Monday- 138/78, 127/74, 124/76 -Discussed how certain factors affect BP, such as stress, exertion, diet/salt intake -Educated on proper BP monitoring technique -Based on recent home BP values averaging 124/76, HTN is currently controlled on current medication regimen -Patient will continue to monitor and record BP regularly and bring readings to upcoming appointment with PCP 10/14 -He also plans to bring new BP monitor to compare accuracy to clinic BP monitor  Lenna Gilford, PharmD, DPLA

## 2022-12-12 ENCOUNTER — Telehealth: Payer: Self-pay | Admitting: Nurse Practitioner

## 2022-12-12 NOTE — Telephone Encounter (Signed)
12/12/22 - Pt called wanting to speak with the nurse to get clarification on a referral letter he received. He wants a call back at  [phone]

## 2022-12-12 NOTE — Telephone Encounter (Signed)
Spoke with pt and provided clarification about referral.

## 2022-12-15 ENCOUNTER — Telehealth: Payer: Self-pay

## 2022-12-15 NOTE — Patient Outreach (Signed)
Care Coordination   Initial Visit Note   12/15/2022 Name: Calvin [name]. MRN: [id number] DOB: 12-23-1950  Calvin [name]. is a 72 y.o. year old male who sees Nche, Bonna Gains, NP for primary care. I spoke with  Calvin [name]. by phone today.  What matters to the patients health and wellness today?  none    Goals Addressed             This Visit's Progress    COMPLETED: Care Coordination Activities-No follow up required       Care Coordination Interventions: Advised patient to Annual Wellness exam. Discussed [facility] services and support. Assessed SDOH. Advised to discuss with primary care physician if services needed in the future.        SDOH assessments and interventions completed:  Yes  SDOH Interventions Today    Flowsheet Row Most Recent Value  SDOH Interventions   Food Insecurity Interventions Intervention Not Indicated  Utilities Interventions Intervention Not Indicated        Care Coordination Interventions:  Yes, provided   Follow up plan: No further intervention required.   Encounter Outcome:  Patient Visit Completed   Bary Leriche, RN, MSN [facility]  [facility], [facility] Management Community Coordinator Direct Dial: [phone]  Fax: [phone] Website: [email or web address]

## 2022-12-15 NOTE — Patient Instructions (Signed)
Visit Information  Thank you for taking time to visit with me today. Please don't hesitate to contact me if I can be of assistance to you.   Following are the goals we discussed today:   Goals Addressed             This Visit's Progress    COMPLETED: Care Coordination Activities-No follow up required       Care Coordination Interventions: Advised patient to Annual Wellness exam. Discussed [facility] services and support. Assessed SDOH. Advised to discuss with primary care physician if services needed in the future.          If you are experiencing a Mental Health or Behavioral Health Crisis or need someone to talk to, please call the Suicide and Crisis Lifeline: 988   Patient verbalizes understanding of instructions and care plan provided today and agrees to view in MyChart. Active MyChart status and patient understanding of how to access instructions and care plan via MyChart confirmed with patient.     The patient has been provided with contact information for the care management team and has been advised to call with any health related questions or concerns.   Bary Leriche, RN, MSN [facility], [facility] Management Community Coordinator Direct Dial: [phone]  Fax: [phone] Website: [email or web address]

## 2022-12-25 ENCOUNTER — Ambulatory Visit (INDEPENDENT_AMBULATORY_CARE_PROVIDER_SITE_OTHER): Payer: Medicare Other | Admitting: Nurse Practitioner

## 2022-12-25 ENCOUNTER — Ambulatory Visit
Admission: RE | Admit: 2022-12-25 | Discharge: 2022-12-25 | Disposition: A | Discharge status: Home / Self Care | Payer: Medicare Other | Source: Ambulatory Visit | Attending: Nurse Practitioner | Admitting: Nurse Practitioner

## 2022-12-25 ENCOUNTER — Encounter: Payer: Self-pay | Admitting: Nurse Practitioner

## 2022-12-25 VITALS — BP 110/70 | HR 69 | Temp 97.6°F | Ht 67.0 in | Wt 255.8 lb

## 2022-12-25 DIAGNOSIS — I10 Essential (primary) hypertension: Secondary | ICD-10-CM | POA: Diagnosis not present

## 2022-12-25 DIAGNOSIS — N1832 Chronic kidney disease, stage 3b: Secondary | ICD-10-CM

## 2022-12-25 DIAGNOSIS — N281 Cyst of kidney, acquired: Secondary | ICD-10-CM | POA: Diagnosis not present

## 2022-12-25 LAB — RENAL FUNCTION PANEL
Albumin: 4.5 g/dL (ref 3.5–5.2)
BUN: 31 mg/dL — ABNORMAL HIGH (ref 6–23)
CO2: 27 meq/L (ref 19–32)
Calcium: 9.9 mg/dL (ref 8.4–10.5)
Chloride: 107 meq/L (ref 96–112)
Creatinine, Ser: 1.52 mg/dL — ABNORMAL HIGH (ref 0.40–1.50)
GFR: 44.9 mL/min — ABNORMAL LOW (ref >60.00)
Glucose, Bld: 91 mg/dL (ref 70–99)
Phosphorus: 3.2 mg/dL (ref 2.3–4.6)
Potassium: 4.4 meq/L (ref 3.5–5.1)
Sodium: 141 meq/L (ref 135–145)

## 2022-12-25 LAB — URINALYSIS, MICROSCOPIC ONLY: RBC / HPF: NONE SEEN (ref 0–?)

## 2022-12-25 NOTE — Assessment & Plan Note (Signed)
BP at goal Home BP: 125/79, 130/78, 120/70, 138/78, 115/73, 127/74, 123/70, 116/71, 138/83, 120/72, 128/76, 126/76, 123/72, 114/65, 124/77, 121/73, 120/76 Denies any adverse effects with current meds BP Readings from Last 3 Encounters:  12/25/22 110/70  12/11/22 124/76  11/27/22 (!) 140/80    Maintain med doses Repeat BMP F/up in 3months

## 2022-12-25 NOTE — Patient Instructions (Signed)
Go to lab Maintain current med doses

## 2022-12-25 NOTE — Progress Notes (Signed)
Established Patient Visit  Patient: Calvin [name].   DOB: 11-20-1950   72 y.o. Male  MRN: [id number] Visit Date: 12/25/2022  Subjective:    Chief Complaint  Patient presents with   Follow-up   HPI Essential hypertension BP at goal Home BP: 125/79, 130/78, 120/70, 138/78, 115/73, 127/74, 123/70, 116/71, 138/83, 120/72, 128/76, 126/76, 123/72, 114/65, 124/77, 121/73, 120/76 Denies any adverse effects with current meds BP Readings from Last 3 Encounters:  12/25/22 110/70  12/11/22 124/76  11/27/22 (!) 140/80    Maintain med doses Repeat BMP F/up in 3months  CKD (chronic kidney disease) stage 3, GFR 30-59 ml/min (HCC) Pending appointment with nephrology Normal UACr Check urine microscope today Ordered renal US  Reviewed medical, surgical, and social history today  Medications: Outpatient Medications Prior to Visit  Medication Sig   acetaminophen (TYLENOL) 325 MG tablet Take 650 mg by mouth every 6 (six) hours as needed.   allopurinol (ZYLOPRIM) 300 MG tablet TAKE 1 TABLET BY MOUTH DAILY   aspirin 81 MG tablet Take 81 mg by mouth daily.   diclofenac Sodium (VOLTAREN) 1 % GEL Apply topically 4 (four) times daily. Pt uses as needed.   fenofibrate 54 MG tablet TAKE 1 TABLET BY MOUTH DAILY   finasteride (PROSCAR) 5 MG tablet TAKE 1 TABLET BY MOUTH DAILY   FLUZONE HIGH-DOSE QUADRIVALENT 0.7 ML SUSY    irbesartan (AVAPRO) 300 MG tablet TAKE 1 TABLET BY MOUTH DAILY   Multiple Vitamins-Minerals (CENTRUM MEN PO) Take 1 tablet by mouth daily.   rosuvastatin (CRESTOR) 20 MG tablet TAKE 1 TABLET BY MOUTH DAILY   spironolactone (ALDACTONE) 25 MG tablet Take 0.5 tablets (12.5 mg total) by mouth every morning.   tamsulosin (FLOMAX) 0.4 MG CAPS capsule TAKE 1 CAPSULE BY MOUTH DAILY   verapamil (CALAN-SR) 240 MG CR tablet TAKE 1 TABLET BY MOUTH AT  BEDTIME   SPIKEVAX syringe    No facility-administered medications prior to visit.   Reviewed past medical and social  history.   ROS per HPI above      Objective:  BP 110/70 Comment: manual cuff  Pulse 69   Temp 97.6 F (36.4 C) (Temporal)   Ht 5\' 7"  (1.702 m)   Wt 255 lb 12.8 oz (116 kg)   SpO2 99%   BMI 40.06 kg/m      Physical Exam Vitals and nursing note reviewed.  Cardiovascular:     Rate and Rhythm: Normal rate and regular rhythm.     Pulses: Normal pulses.     Heart sounds: Normal heart sounds.  Pulmonary:     Effort: Pulmonary effort is normal.     Breath sounds: Normal breath sounds.  Musculoskeletal:     Right lower leg: No edema.     Left lower leg: No edema.  Neurological:     Mental Status: He is alert and oriented to person, place, and time.     Results for orders placed or performed in visit on 12/25/22  Urine Microscopic Only  Result Value Ref Range   WBC, UA 0-2/hpf 0-2/hpf   RBC / HPF none seen 0-2/hpf   Squamous Epithelial / HPF Rare(0-4/hpf) Rare(0-4/hpf)      Assessment & Plan:    Problem List Items Addressed This Visit     CKD (chronic kidney disease) stage 3, GFR 30-59 ml/min (HCC)    Pending appointment with nephrology Normal UACr Check urine microscope  today Ordered renal US      Relevant Orders   Renal Function Panel   Urine Microscopic Only (Completed)   US RENAL   Essential hypertension - Primary    BP at goal Home BP: 125/79, 130/78, 120/70, 138/78, 115/73, 127/74, 123/70, 116/71, 138/83, 120/72, 128/76, 126/76, 123/72, 114/65, 124/77, 121/73, 120/76 Denies any adverse effects with current meds BP Readings from Last 3 Encounters:  12/25/22 110/70  12/11/22 124/76  11/27/22 (!) 140/80    Maintain med doses Repeat BMP F/up in 3months      Relevant Orders   Renal Function Panel   Urine Microscopic Only (Completed)   US RENAL   Return for maintain upcoming appt in December.     Alysia Penna, NP

## 2022-12-25 NOTE — Assessment & Plan Note (Signed)
Pending appointment with nephrology Normal UACr Check urine microscope today Ordered renal US

## 2022-12-26 ENCOUNTER — Telehealth: Payer: Self-pay | Admitting: Pulmonary Disease

## 2022-12-26 ENCOUNTER — Encounter: Payer: Self-pay | Admitting: Nurse Practitioner

## 2022-12-26 DIAGNOSIS — G4733 Obstructive sleep apnea (adult) (pediatric): Secondary | ICD-10-CM

## 2022-12-26 NOTE — Telephone Encounter (Addendum)
Pt calling in to get his DME to a new company his insurance isn't in network with [employer] any longer. Ins in network with [employer] [phone]

## 2022-12-29 NOTE — Telephone Encounter (Signed)
Patient is a former pt of Dr. Craige Cotta and looks like he has only seen Dr. Craige Cotta for appts.  With Dr. Tonia Brooms being provider of the day and since he is not a sleep provider, sending to [facility] since she does see sleep pts for assistance to see if she is okay with [place] placing the order for DME switch for pt to receive machine.

## 2022-12-29 NOTE — Telephone Encounter (Signed)
Yes we can send CPAP order to [facility]

## 2022-12-30 NOTE — Telephone Encounter (Signed)
Cpap order placed.

## 2023-01-16 ENCOUNTER — Encounter: Payer: Self-pay | Admitting: Nurse Practitioner

## 2023-01-24 ENCOUNTER — Telehealth: Payer: Self-pay | Admitting: Pulmonary Disease

## 2023-01-24 NOTE — Telephone Encounter (Signed)
Calvin [name] is is calling PT stating they are missing something from [place] in order to fill his CPAP order. Please call [facility] and PT to advise. PT # is [phone]

## 2023-01-26 NOTE — Telephone Encounter (Signed)
error 

## 2023-01-26 NOTE — Telephone Encounter (Signed)
I spoke to the patient & advised him I will reach out to [facility] & [facility] to see what is needed & make sure it is taken care of.  I let the patient know I will call him with an update,  Pt verbalized understanding, but said that he spoke to someone earlier this week who took down all of this information, but couldn't remember who he spoke to & was advised to call back today for an update.

## 2023-01-26 NOTE — Telephone Encounter (Signed)
I have received a request for this pt's CPAP supply order they received for an Rx for the CPAP equipment (mask, tubing, settings, etc).

## 2023-02-01 ENCOUNTER — Telehealth: Payer: Self-pay | Admitting: Hematology and Oncology

## 2023-02-06 DIAGNOSIS — M109 Gout, unspecified: Secondary | ICD-10-CM | POA: Diagnosis not present

## 2023-02-06 DIAGNOSIS — E785 Hyperlipidemia, unspecified: Secondary | ICD-10-CM | POA: Diagnosis not present

## 2023-02-06 DIAGNOSIS — I129 Hypertensive chronic kidney disease with stage 1 through stage 4 chronic kidney disease, or unspecified chronic kidney disease: Secondary | ICD-10-CM | POA: Diagnosis not present

## 2023-02-06 DIAGNOSIS — D472 Monoclonal gammopathy: Secondary | ICD-10-CM | POA: Diagnosis not present

## 2023-02-06 DIAGNOSIS — N1831 Chronic kidney disease, stage 3a: Secondary | ICD-10-CM | POA: Diagnosis not present

## 2023-02-15 ENCOUNTER — Telehealth (HOSPITAL_BASED_OUTPATIENT_CLINIC_OR_DEPARTMENT_OTHER): Payer: Self-pay | Admitting: Primary Care

## 2023-02-15 NOTE — Telephone Encounter (Signed)
[facility] calling with patient on the line. Patient expressed that he has yet to receive supplies and is running out. Verified order was placed by [place].   Also verified with the [facility] rep Caryn Bee states order is good and all that is needed is a signature from the provider. They have Buelah Manis as the provider signing and verified with 11/15 encounter.  Please advise. F: [phone] and email for electronic send: [email or web address]   Jeanice Lim I am routing to you let me know if I did incorrectly, wanted to see if you had any papers for this patient from 11/15. Thank you!

## 2023-02-16 NOTE — Telephone Encounter (Signed)
Triage - please see if you have anything from [facility] for this patient.  Thank you!

## 2023-02-16 NOTE — Telephone Encounter (Signed)
I do not have any pending [facility] orders.

## 2023-02-19 NOTE — Telephone Encounter (Signed)
I checked Calvin [name]'s paperwork and have not received anything on this pt

## 2023-02-27 ENCOUNTER — Ambulatory Visit: Payer: Medicare Other | Admitting: Nurse Practitioner

## 2023-02-27 NOTE — Telephone Encounter (Signed)
Can you please look at this one. It looks as if it was sent to [place] and not [facility].

## 2023-02-27 NOTE — Telephone Encounter (Signed)
Patient calling again for an update. Would like to have this resolved before christmas please.   Could we request the order to be faxed to [place] again so they can have a signature? Please advise if possible

## 2023-02-28 ENCOUNTER — Ambulatory Visit: Payer: Medicare Other | Admitting: Nurse Practitioner

## 2023-02-28 ENCOUNTER — Encounter: Payer: Self-pay | Admitting: Nurse Practitioner

## 2023-02-28 VITALS — BP 130/68 | HR 60 | Temp 97.8°F | Resp 18 | Ht 67.0 in | Wt 257.8 lb

## 2023-02-28 DIAGNOSIS — E782 Mixed hyperlipidemia: Secondary | ICD-10-CM | POA: Diagnosis not present

## 2023-02-28 DIAGNOSIS — D573 Sickle-cell trait: Secondary | ICD-10-CM

## 2023-02-28 DIAGNOSIS — I10 Essential (primary) hypertension: Secondary | ICD-10-CM | POA: Diagnosis not present

## 2023-02-28 DIAGNOSIS — R739 Hyperglycemia, unspecified: Secondary | ICD-10-CM

## 2023-02-28 DIAGNOSIS — N1831 Chronic kidney disease, stage 3a: Secondary | ICD-10-CM | POA: Diagnosis not present

## 2023-02-28 LAB — LIPID PANEL
Cholesterol: 120 mg/dL (ref 0–200)
HDL: 24.5 mg/dL — ABNORMAL LOW (ref >39.00)
LDL Cholesterol: 76 mg/dL (ref 0–99)
NonHDL: 95.33
Total CHOL/HDL Ratio: 5
Triglycerides: 98 mg/dL (ref 0.0–149.0)
VLDL: 19.6 mg/dL (ref 0.0–40.0)

## 2023-02-28 LAB — HEMOGLOBIN A1C: Hgb A1c MFr Bld: 5.8 % (ref 4.6–6.5)

## 2023-02-28 NOTE — Assessment & Plan Note (Signed)
Average home BP 110s-120s/60s/70s BP at goal Denies any adverse effects with current meds BP Readings from Last 3 Encounters:  02/28/23 130/68  12/25/22 110/70  12/11/22 124/76    Maintain med doses F/up in 3months

## 2023-02-28 NOTE — Progress Notes (Signed)
Established Patient Visit  Patient: Calvin [name].   DOB: [birth year]   72 y.o. Male  MRN: [id number] Visit Date: 02/28/2023  Subjective:    Chief Complaint  Patient presents with   OFFICE VISIT     6 month follow up   Hyperlipidemia   Hypertension   HPI Sickle cell trait (HCC) Followed by hematology  Mixed hyperlipidemia No adverse effects with crestor and fenofibrate Repeat lipid panel  Hyperglycemia Repeat hgbA1c  CKD (chronic kidney disease) stage 3, GFR 30-59 ml/min (HCC) Had appointment with nephrology: recommended proper oral hydration and avoid NSAIDs. Reviewed OV note: normal UA and urine microscope  F/up annually  Essential hypertension Average home BP 110s-120s/60s/70s BP at goal Denies any adverse effects with current meds BP Readings from Last 3 Encounters:  02/28/23 130/68  12/25/22 110/70  12/11/22 124/76    Maintain med doses F/up in 3months  Reviewed medical, surgical, and social history today  Medications: Outpatient Medications Prior to Visit  Medication Sig   acetaminophen (TYLENOL) 325 MG tablet Take 650 mg by mouth every 6 (six) hours as needed.   allopurinol (ZYLOPRIM) 300 MG tablet TAKE 1 TABLET BY MOUTH DAILY   aspirin 81 MG tablet Take 81 mg by mouth daily.   clindamycin (CLEOCIN T) 1 % external solution Apply topically as directed.   diclofenac Sodium (VOLTAREN) 1 % GEL Apply topically 4 (four) times daily. Pt uses as needed.   fenofibrate 54 MG tablet TAKE 1 TABLET BY MOUTH DAILY   finasteride (PROSCAR) 5 MG tablet TAKE 1 TABLET BY MOUTH DAILY   FLUZONE HIGH-DOSE QUADRIVALENT 0.7 ML SUSY    irbesartan (AVAPRO) 300 MG tablet TAKE 1 TABLET BY MOUTH DAILY   Multiple Vitamins-Minerals (CENTRUM MEN PO) Take 1 tablet by mouth daily.   rosuvastatin (CRESTOR) 20 MG tablet TAKE 1 TABLET BY MOUTH DAILY   SPIKEVAX syringe    spironolactone (ALDACTONE) 25 MG tablet Take 0.5 tablets (12.5 mg total) by mouth every morning.    tamsulosin (FLOMAX) 0.4 MG CAPS capsule TAKE 1 CAPSULE BY MOUTH DAILY   verapamil (CALAN-SR) 240 MG CR tablet TAKE 1 TABLET BY MOUTH AT  BEDTIME   No facility-administered medications prior to visit.   Reviewed past medical and social history.   ROS per HPI above      Objective:  BP 130/68 (BP Location: Left Arm, Patient Position: Sitting, Cuff Size: Large)   Pulse 60   Temp 97.8 F (36.6 C) (Oral)   Resp 18   Ht 5\' 7"  (1.702 m)   Wt 257 lb 12.8 oz (116.9 kg)   SpO2 98%   BMI 40.38 kg/m      Physical Exam Vitals and nursing note reviewed.  Cardiovascular:     Rate and Rhythm: Normal rate and regular rhythm.     Pulses: Normal pulses.     Heart sounds: Normal heart sounds.  Pulmonary:     Effort: Pulmonary effort is normal.     Breath sounds: Normal breath sounds.  Musculoskeletal:     Right lower leg: No edema.     Left lower leg: No edema.  Neurological:     Mental Status: He is alert and oriented to person, place, and time.     No results found for any visits on 02/28/23.    Assessment & Plan:    Problem List Items Addressed This Visit     CKD (  chronic kidney disease) stage 3, GFR 30-59 ml/min (HCC)   Had appointment with nephrology: recommended proper oral hydration and avoid NSAIDs. Reviewed OV note: normal UA and urine microscope  F/up annually      Essential hypertension - Primary   Average home BP 110s-120s/60s/70s BP at goal Denies any adverse effects with current meds BP Readings from Last 3 Encounters:  02/28/23 130/68  12/25/22 110/70  12/11/22 124/76    Maintain med doses F/up in 3months      Hyperglycemia   Repeat hgbA1c      Relevant Orders   Hemoglobin A1c   Mixed hyperlipidemia   No adverse effects with crestor and fenofibrate Repeat lipid panel      Relevant Orders   Lipid panel   Sickle cell trait (HCC)   Followed by hematology      Return in about 6 months (around 08/29/2023) for HTN, hyperlipidemia, hyperlipidemia  (fasting).     Alysia Penna, NP

## 2023-02-28 NOTE — Assessment & Plan Note (Signed)
Repeat hgbA1c 

## 2023-02-28 NOTE — Assessment & Plan Note (Signed)
No adverse effects with crestor and fenofibrate Repeat lipid panel

## 2023-02-28 NOTE — Assessment & Plan Note (Signed)
Had appointment with nephrology: recommended proper oral hydration and avoid NSAIDs. Reviewed OV note: normal UA and urine microscope  F/up annually

## 2023-02-28 NOTE — Assessment & Plan Note (Signed)
Followed by hematology 

## 2023-02-28 NOTE — Patient Instructions (Signed)
Go to lab Continue Heart healthy diet and daily exercise. Maintain current medications. 

## 2023-03-12 ENCOUNTER — Other Ambulatory Visit: Payer: Self-pay | Admitting: Nurse Practitioner

## 2023-03-12 DIAGNOSIS — N401 Enlarged prostate with lower urinary tract symptoms: Secondary | ICD-10-CM

## 2023-03-15 ENCOUNTER — Other Ambulatory Visit: Payer: Self-pay | Admitting: Nurse Practitioner

## 2023-03-15 DIAGNOSIS — M1A0620 Idiopathic chronic gout, left knee, without tophus (tophi): Secondary | ICD-10-CM

## 2023-03-15 DIAGNOSIS — I10 Essential (primary) hypertension: Secondary | ICD-10-CM

## 2023-03-15 DIAGNOSIS — E782 Mixed hyperlipidemia: Secondary | ICD-10-CM

## 2023-03-15 DIAGNOSIS — N401 Enlarged prostate with lower urinary tract symptoms: Secondary | ICD-10-CM

## 2023-04-18 ENCOUNTER — Other Ambulatory Visit: Payer: Self-pay | Admitting: Hematology and Oncology

## 2023-04-18 DIAGNOSIS — D472 Monoclonal gammopathy: Secondary | ICD-10-CM

## 2023-04-19 ENCOUNTER — Inpatient Hospital Stay: Payer: Medicare Other | Attending: Hematology and Oncology

## 2023-04-19 DIAGNOSIS — D649 Anemia, unspecified: Secondary | ICD-10-CM | POA: Insufficient documentation

## 2023-04-19 DIAGNOSIS — D573 Sickle-cell trait: Secondary | ICD-10-CM | POA: Diagnosis present

## 2023-04-19 DIAGNOSIS — D696 Thrombocytopenia, unspecified: Secondary | ICD-10-CM | POA: Diagnosis not present

## 2023-04-19 DIAGNOSIS — Z87891 Personal history of nicotine dependence: Secondary | ICD-10-CM | POA: Insufficient documentation

## 2023-04-19 DIAGNOSIS — D472 Monoclonal gammopathy: Secondary | ICD-10-CM | POA: Insufficient documentation

## 2023-04-19 DIAGNOSIS — D72819 Decreased white blood cell count, unspecified: Secondary | ICD-10-CM | POA: Insufficient documentation

## 2023-04-19 LAB — CMP (CANCER CENTER ONLY)
ALT: 13 U/L (ref 0–44)
AST: 20 U/L (ref 15–41)
Albumin: 4.4 g/dL (ref 3.5–5.0)
Alkaline Phosphatase: 38 U/L (ref 38–126)
Anion gap: 3 — ABNORMAL LOW (ref 5–15)
BUN: 29 mg/dL — ABNORMAL HIGH (ref 8–23)
CO2: 27 mmol/L (ref 22–32)
Calcium: 9.2 mg/dL (ref 8.9–10.3)
Chloride: 111 mmol/L (ref 98–111)
Creatinine: 1.71 mg/dL — ABNORMAL HIGH (ref 0.61–1.24)
GFR, Estimated: 41 mL/min — ABNORMAL LOW (ref >60)
Glucose, Bld: 97 mg/dL (ref 70–99)
Potassium: 4.5 mmol/L (ref 3.5–5.1)
Sodium: 141 mmol/L (ref 135–145)
Total Bilirubin: 0.5 mg/dL (ref 0.0–1.2)
Total Protein: 7.5 g/dL (ref 6.5–8.1)

## 2023-04-19 LAB — CBC WITH DIFFERENTIAL (CANCER CENTER ONLY)
Abs Immature Granulocytes: 0.01 10*3/uL (ref 0.00–0.07)
Basophils Absolute: 0 10*3/uL (ref 0.0–0.1)
Basophils Relative: 0 %
Eosinophils Absolute: 0 10*3/uL (ref 0.0–0.5)
Eosinophils Relative: 1 %
HCT: 34.3 % — ABNORMAL LOW (ref 39.0–52.0)
Hemoglobin: 11.5 g/dL — ABNORMAL LOW (ref 13.0–17.0)
Immature Granulocytes: 0 %
Lymphocytes Relative: 39 %
Lymphs Abs: 1.1 10*3/uL (ref 0.7–4.0)
MCH: 31.3 pg (ref 26.0–34.0)
MCHC: 33.5 g/dL (ref 30.0–36.0)
MCV: 93.5 fL (ref 80.0–100.0)
Monocytes Absolute: 0.2 10*3/uL (ref 0.1–1.0)
Monocytes Relative: 6 %
Neutro Abs: 1.5 10*3/uL — ABNORMAL LOW (ref 1.7–7.7)
Neutrophils Relative %: 54 %
Platelet Count: 142 10*3/uL — ABNORMAL LOW (ref 150–400)
RBC: 3.67 MIL/uL — ABNORMAL LOW (ref 4.22–5.81)
RDW: 13.3 % (ref 11.5–15.5)
WBC Count: 2.9 10*3/uL — ABNORMAL LOW (ref 4.0–10.5)
nRBC: 0 % (ref 0.0–0.2)

## 2023-04-19 LAB — LACTATE DEHYDROGENASE: LDH: 125 U/L (ref 98–192)

## 2023-04-20 ENCOUNTER — Encounter: Payer: Self-pay | Admitting: Nurse Practitioner

## 2023-04-20 DIAGNOSIS — I10 Essential (primary) hypertension: Secondary | ICD-10-CM

## 2023-04-20 LAB — KAPPA/LAMBDA LIGHT CHAINS
Kappa free light chain: 55.7 mg/L — ABNORMAL HIGH (ref 3.3–19.4)
Kappa, lambda light chain ratio: 2.63 — ABNORMAL HIGH (ref 0.26–1.65)
Lambda free light chains: 21.2 mg/L (ref 5.7–26.3)

## 2023-04-20 MED ORDER — SPIRONOLACTONE 25 MG PO TABS: 12.5000 mg | ORAL_TABLET | Freq: Every morning | ORAL | 5 refills | Status: DC

## 2023-04-23 LAB — MULTIPLE MYELOMA PANEL, SERUM
Albumin SerPl Elph-Mcnc: 4 g/dL (ref 2.9–4.4)
Albumin/Glob SerPl: 1.3 (ref 0.7–1.7)
Alpha 1: 0.2 g/dL (ref 0.0–0.4)
Alpha2 Glob SerPl Elph-Mcnc: 0.6 g/dL (ref 0.4–1.0)
B-Globulin SerPl Elph-Mcnc: 1 g/dL (ref 0.7–1.3)
Gamma Glob SerPl Elph-Mcnc: 1.3 g/dL (ref 0.4–1.8)
Globulin, Total: 3.1 g/dL (ref 2.2–3.9)
IgA: 154 mg/dL (ref 61–437)
IgG (Immunoglobin G), Serum: 1338 mg/dL (ref 603–1613)
IgM (Immunoglobulin M), Srm: 106 mg/dL (ref 15–143)
M Protein SerPl Elph-Mcnc: 0.3 g/dL — ABNORMAL HIGH
Total Protein ELP: 7.1 g/dL (ref 6.0–8.5)

## 2023-04-26 ENCOUNTER — Inpatient Hospital Stay: Payer: Medicare Other

## 2023-04-26 ENCOUNTER — Inpatient Hospital Stay: Payer: Medicare Other | Admitting: Hematology and Oncology

## 2023-04-26 VITALS — BP 134/80 | HR 75 | Temp 97.3°F | Resp 15 | Wt 263.9 lb

## 2023-04-26 DIAGNOSIS — D72819 Decreased white blood cell count, unspecified: Secondary | ICD-10-CM | POA: Diagnosis not present

## 2023-04-26 DIAGNOSIS — D696 Thrombocytopenia, unspecified: Secondary | ICD-10-CM | POA: Diagnosis not present

## 2023-04-26 DIAGNOSIS — D472 Monoclonal gammopathy: Secondary | ICD-10-CM | POA: Diagnosis not present

## 2023-04-26 DIAGNOSIS — D500 Iron deficiency anemia secondary to blood loss (chronic): Secondary | ICD-10-CM

## 2023-04-26 DIAGNOSIS — Z87891 Personal history of nicotine dependence: Secondary | ICD-10-CM | POA: Diagnosis not present

## 2023-04-26 DIAGNOSIS — D649 Anemia, unspecified: Secondary | ICD-10-CM | POA: Diagnosis not present

## 2023-04-26 DIAGNOSIS — D573 Sickle-cell trait: Secondary | ICD-10-CM | POA: Diagnosis not present

## 2023-04-26 LAB — IRON AND IRON BINDING CAPACITY (CC-WL,HP ONLY)
Iron: 107 ug/dL (ref 45–182)
Saturation Ratios: 25 % (ref 17.9–39.5)
TIBC: 434 ug/dL (ref 250–450)
UIBC: 327 ug/dL (ref 117–376)

## 2023-04-26 LAB — FERRITIN: Ferritin: 37 ng/mL (ref 24–336)

## 2023-04-26 NOTE — Progress Notes (Signed)
[facility] [phone]   [phone]  PROGRESS NOTE  Patient Care Team: Nche, Bonna Gains, NP as PCP - General (Internal Medicine) Marcine Matar, MD as Consulting Physician (Urology) Beverely Low, MD as Consulting Physician (Orthopedic Surgery) Lenna Gilford, [facility] (Pharmacist)  Hematological/Oncological History  # Normocytic Anemia 1) 05/29/2018: HB electrophoresis confirms sickle cell trait. Ferritin 18. Hgb 13.1. MMA 107 2) 01/22/2019: iron 112, TIBC 313, Sat 36%, ferritin 31. Hgb 12.6 3) 01/27/2019: establish care with Dr. Leonides Schanz. WBC 2.6, Hgb 12.6, Plt 121, MCV 91.4 4) 10/20/2019: WBC 3.0, Hgb 12.9, Plt 115, MCV 91   #IgG Kappa MGUS 1) 09/18/2018:  M protein 0.2, kappa 38, lambda 17.6, ratio 2.2 2) 01/27/2019: establish care with Dr. Leonides Schanz  3) 10/20/2019: M protein 0.1, Kappa 48.3, Lambda 18, Ratio 2.68 4) 11/05/2020: M protein 0.3, Kappa 56, Lambda 19.2, Ratio 2.92 5) 04/28/2021: M protein 0.2, Kappa 58.9, Lambda 24.1, Ratio 2.44  #Leukopenia 1) Chronically low, with WBC 3.5 dating back to 03/24/2009 2) 2015-2020: stable at baseline WBC 3.5-5.0 3) 01/27/2019: establish care with Dr. Leonides Schanz.  WBC 2.6, Hgb 12.6, Plt 121, MCV 91.4 4) 10/20/2019: WBC 3.0, Hgb 12.9, Plt 115, MCV 91 5) 10/26/2021: WBC 2.3, Hgb 11.7, MCV 85, Plt 131  Interval History:  Calvin [name]. 73 y.o. male with medical history significant for MGUS presents for a follow up visit. He was last seen on 05/04/2022. In the interim since our last visit he has had no ED visits or hospitalizations.   On exam today Calvin [name] reports he has been well overall since her last visit.  He reports that he has had no recent infectious symptoms such as runny nose, sore throat, or cough.  He has not had any flu or COVID this season.  He reports that he did recently start on spironolactone as recommended by his doctor for blood pressure.  He reports he does feel like he is urinating more  but there is no bubbling, foaming, or abnormal color of the urine.  He reports that he has good energy and appetite.  He is disheartened by the fact his weight is continuing to increase.  He reports his weight is 260 pounds, up from 255 back in October.  He reports he is not having any bone or back pain..  Overall he has had no other major changes in his health. Full 10 point ROS is listed below.    MEDICAL HISTORY:  Past Medical History:  Diagnosis Date   Allergy    Arthritis    Degenerative TFCC tear, right 11/08/2017   Injected November 08, 2017   Diverticulosis    Gout    Hyperlipidemia    Hypertension    Iron deficiency anemia 09/20/2018   OSA (obstructive sleep apnea) 11/13/2013   Sickle cell trait (HCC)    Sleep apnea    Urinary frequency 07/20/2012   Urinary retention 07/20/2012    SURGICAL HISTORY: Past Surgical History:  Procedure Laterality Date   COLONOSCOPY     POLYPECTOMY     ROTATOR CUFF REPAIR     left   THROAT SURGERY     polyps removed from vocal cord    SOCIAL HISTORY: Social History   Socioeconomic History   Marital status: Married    Spouse name: Not on file   Number of children: 1   Years of education: Not on file   Highest education level: Bachelor's degree (e.g., BA, AB, BS)  Occupational History  Occupation: retired  Tobacco Use   Smoking status: Former    Current packs/day: 0.00    Average packs/day: 1 pack/day for 5.0 years (5.0 ttl pk-yrs)    Types: Cigarettes    Start date: 05/23/1972    Quit date: 05/23/1977    Years since quitting: 45.9   Smokeless tobacco: Never   Tobacco comments:    5pack years, no need for lung cancer screen  Vaping Use   Vaping status: Never Used  Substance and Sexual Activity   Alcohol use: No    Comment: QUIT 2002   Drug use: No    Comment: Completed program in april 2000   Sexual activity: Yes  Other Topics Concern   Not on file  Social History Narrative   Not on file   Social Drivers of Health    Financial Resource Strain: Low Risk  (02/25/2023)   Overall Financial Resource Strain (CARDIA)    Difficulty of Paying Living Expenses: Not hard at all  Food Insecurity: No Food Insecurity (02/25/2023)   Hunger Vital Sign    Worried About Running Out of Food in the Last Year: Never true    Ran Out of Food in the Last Year: Never true  Transportation Needs: No Transportation Needs (02/25/2023)   PRAPARE - Administrator, Civil Service (Medical): No    Lack of Transportation (Non-Medical): No  Physical Activity: Insufficiently Active (02/25/2023)   Exercise Vital Sign    Days of Exercise per Week: 3 days    Minutes of Exercise per Session: 20 min  Stress: No Stress Concern Present (02/25/2023)   [employer] of Occupational Health - Occupational Stress Questionnaire    Feeling of Stress : Not at all  Social Connections: Socially Integrated (02/25/2023)   Social Connection and Isolation Panel [NHANES]    Frequency of Communication with Friends and Family: More than three times a week    Frequency of Social Gatherings with Friends and Family: Twice a week    Attends Religious Services: More than 4 times per year    Active Member of [employer] or Organizations: Yes    Attends Engineer, structural: More than 4 times per year    Marital Status: Married  Catering manager Violence: Not At Risk (02/15/2021)   Humiliation, Afraid, Rape, and Kick questionnaire    Fear of Current or Ex-Partner: No    Emotionally Abused: No    Physically Abused: No    Sexually Abused: No    FAMILY HISTORY: Family History  Problem Relation Age of Onset   Colon cancer Neg Hx    Esophageal cancer Neg Hx    Rectal cancer Neg Hx    Stomach cancer Neg Hx     ALLERGIES:  has no known allergies.  MEDICATIONS:  Current Outpatient Medications  Medication Sig Dispense Refill   acetaminophen (TYLENOL) 325 MG tablet Take 650 mg by mouth every 6 (six) hours as needed.     allopurinol  (ZYLOPRIM) 300 MG tablet TAKE 1 TABLET BY MOUTH DAILY 100 tablet 1   aspirin 81 MG tablet Take 81 mg by mouth daily.     clindamycin (CLEOCIN T) 1 % external solution Apply topically as directed.     diclofenac Sodium (VOLTAREN) 1 % GEL Apply topically 4 (four) times daily. Pt uses as needed.     fenofibrate 54 MG tablet TAKE 1 TABLET BY MOUTH DAILY 100 tablet 2   finasteride (PROSCAR) 5 MG tablet TAKE 1 TABLET BY MOUTH  DAILY 100 tablet 1   FLUZONE HIGH-DOSE QUADRIVALENT 0.7 ML SUSY      irbesartan (AVAPRO) 300 MG tablet TAKE 1 TABLET BY MOUTH DAILY 100 tablet 1   Multiple Vitamins-Minerals (CENTRUM MEN PO) Take 1 tablet by mouth daily.     rosuvastatin (CRESTOR) 20 MG tablet TAKE 1 TABLET BY MOUTH DAILY 100 tablet 1   SPIKEVAX syringe      spironolactone (ALDACTONE) 25 MG tablet Take 0.5 tablets (12.5 mg total) by mouth every morning. 15 tablet 5   tamsulosin (FLOMAX) 0.4 MG CAPS capsule TAKE 1 CAPSULE BY MOUTH DAILY 100 capsule 1   verapamil (CALAN-SR) 240 MG CR tablet TAKE 1 TABLET BY MOUTH AT  BEDTIME 100 tablet 2   No current facility-administered medications for this visit.    REVIEW OF SYSTEMS:   Constitutional: ( - ) fevers, ( - )  chills , ( - ) night sweats Eyes: ( - ) blurriness of vision, ( - ) double vision, ( - ) watery eyes Ears, nose, mouth, throat, and face: ( - ) mucositis, ( - ) sore throat Respiratory: ( - ) cough, ( - ) dyspnea, ( - ) wheezes Cardiovascular: ( - ) palpitation, ( - ) chest discomfort, ( - ) lower extremity swelling Gastrointestinal:  ( - ) nausea, ( - ) heartburn, ( - ) change in bowel habits Skin: ( - ) abnormal skin rashes Lymphatics: ( - ) new lymphadenopathy, ( - ) easy bruising Neurological: ( - ) numbness, ( - ) tingling, ( - ) new weaknesses Behavioral/Psych: ( - ) mood change, ( - ) new changes  All other systems were reviewed with the patient and are negative.  PHYSICAL EXAMINATION: ECOG PERFORMANCE STATUS: 1 - Symptomatic but completely  ambulatory  Vitals:   04/26/23 1049  BP: 134/80  Pulse: 75  Resp: 15  Temp: (!) 97.3 F (36.3 C)  SpO2: 100%    Filed Weights   04/26/23 1049  Weight: 263 lb 14.4 oz (119.7 kg)     GENERAL: well appearing African American male in NAD  SKIN: skin color, texture, turgor are normal, no rashes or significant lesions EYES: conjunctiva are pink and non-injected, sclera clear LUNGS: clear to auscultation and percussion with normal breathing effort HEART: regular rate & rhythm and no murmurs and no lower extremity edema Musculoskeletal: no cyanosis of digits and no clubbing  PSYCH: alert & oriented x 3, fluent speech NEURO: no focal motor/sensory deficits  LABORATORY DATA:  I have reviewed the data as listed    Latest Ref Rng & Units 04/19/2023   10:44 AM 11/02/2022   10:37 AM 04/27/2022   10:31 AM  CBC  WBC 4.0 - 10.5 K/uL 2.9  2.8  5.0   Hemoglobin 13.0 - 17.0 g/dL 24.4  01.0  27.2   Hematocrit 39.0 - 52.0 % 34.3  38.1  39.2   Platelets 150 - 400 K/uL 142  126  181       Latest Ref Rng & Units 04/19/2023   10:44 AM 12/25/2022   10:39 AM 12/11/2022    9:32 AM  CMP  Glucose 70 - 99 mg/dL 97  91  536   BUN 8 - 23 mg/dL 29  31  23    Creatinine 0.61 - 1.24 mg/dL 6.44  0.34  7.42   Sodium 135 - 145 mmol/L 141  141  143   Potassium 3.5 - 5.1 mmol/L 4.5  4.4  4.2   Chloride 98 -  111 mmol/L 111  107  108   CO2 22 - 32 mmol/L 27  27  25    Calcium 8.9 - 10.3 mg/dL 9.2  9.9  9.1   Total Protein 6.5 - 8.1 g/dL 7.5     Total Bilirubin 0.0 - 1.2 mg/dL 0.5     Alkaline Phos 38 - 126 U/L 38     AST 15 - 41 U/L 20     ALT 0 - 44 U/L 13      Lab Results  Component Value Date   KPAFRELGTCHN 55.7 (H) 04/19/2023   KPAFRELGTCHN 50.8 (H) 11/02/2022   KPAFRELGTCHN 41.2 (H) 04/27/2022   LAMBDASER 21.2 04/19/2023   LAMBDASER 20.0 11/02/2022   LAMBDASER 17.7 04/27/2022   KAPLAMBRATIO 2.63 (H) 04/19/2023   KAPLAMBRATIO 2.54 (H) 11/02/2022   KAPLAMBRATIO 2.33 (H) 04/27/2022    Lab  Results  Component Value Date   MPROTEIN 0.3 (H) 04/19/2023   MPROTEIN 0.3 (H) 11/02/2022   MPROTEIN 0.2 (H) 04/27/2022     RADIOGRAPHIC STUDIES: None to review  ASSESSMENT & PLAN Calvin [name]. 73 y.o. male with medical history significant for MGUS presents for a follow up visit.  Review of Mr. Kutzer labs appear stable.    Given the stability of his reported health and the stability of his labs I think we can safely follow-up the patient approximately 6 months time.  At this juncture there is no clear indication for a bone marrow biopsy though I have would have a low threshold to perform this if any worsening abnormalities were noted in his blood.  We will plan to see the patient back in approximately 6 months however if he were to have any new symptoms in the interim we recommend that he call our clinic and we can always see him sooner.  #Normocytic Anemia, stable --HB electrophoresis confirms sickle cell trait, though he has had normal baseline Hgb before in the past.  --Mr. Pinard was a long time blood donor, donating as frequently as he could for years. He may have developed low iron stores due to this generosity. He last donated in 2018. His counts appear stable at this time. Iron stores appear replete as of 04/30/2019.  --labs today show WBC 2.9, Hgb 11.5, MCV 93.5, Plt 142. Cr 1.71  --RTC in 6 months for further review.   #IgG Kappa Monoclonal Gammopathy of Undetermined Significance, stable  --last SPEP/SFLC showed stable labs --will repeat SPEP and SFLC 1 week prior to his next visit.   --patient will require a bone survey at least yearly.  --continue to monitor at least q 6months.   #Leukopenia, stable --low WBC dating back to 2011, stably low around 3.5-.5.0.  --05/29/2018: negative for HIV, Hep C, but had Hep B core antibody, no surface antigen or surface antibody.  --continue to monitor   #Thrombocytopenia-improved --chronic fluctuating thrombocytopenia.  There does appear to be a component of clumping --typically ranges from 100-150 --CT scan of abdomen in March 2020 showed no evidence of cirrhosis. --continue to monitor   #Suspected Arrhythmia --on ascultation of heart I thought I detected a slight arrhythmia at prior visit. --ordered EKG to confirm. No abnormalities on EKG --no issue, continued to monitor  Orders Placed This Encounter  Procedures   Ferritin    Standing Status:   Future    Number of Occurrences:   1    Expiration Date:   04/25/2024   Iron and Iron Binding Capacity ([facility] only)  Standing Status:   Future    Number of Occurrences:   1    Expiration Date:   04/25/2024   All questions were answered. The patient knows to call the clinic with any problems, questions or concerns.  A total of more than 25 minutes were spent face-to-face with the patient during this encounter and over half of that time was spent on counseling and coordination of care as outlined above.   Ulysees Barns, MD Department of Hematology/Oncology [facility] Phone: [phone] Pager: [phone] Email: [email or web address]  04/26/2023 11:56 AM

## 2023-05-03 ENCOUNTER — Other Ambulatory Visit: Payer: Medicare Other

## 2023-05-10 ENCOUNTER — Ambulatory Visit: Payer: Medicare Other | Admitting: Hematology and Oncology

## 2023-05-19 DIAGNOSIS — Z719 Counseling, unspecified: Secondary | ICD-10-CM | POA: Diagnosis not present

## 2023-05-19 DIAGNOSIS — Z23 Encounter for immunization: Secondary | ICD-10-CM | POA: Diagnosis not present

## 2023-07-05 ENCOUNTER — Encounter: Payer: Self-pay | Admitting: Nurse Practitioner

## 2023-07-05 ENCOUNTER — Ambulatory Visit: Payer: Self-pay

## 2023-07-05 ENCOUNTER — Ambulatory Visit (INDEPENDENT_AMBULATORY_CARE_PROVIDER_SITE_OTHER): Admitting: Nurse Practitioner

## 2023-07-05 VITALS — BP 136/78 | HR 70 | Temp 97.4°F | Ht 68.0 in | Wt 264.2 lb

## 2023-07-05 DIAGNOSIS — M62838 Other muscle spasm: Secondary | ICD-10-CM | POA: Diagnosis not present

## 2023-07-05 DIAGNOSIS — Z0184 Encounter for antibody response examination: Secondary | ICD-10-CM

## 2023-07-05 DIAGNOSIS — M5412 Radiculopathy, cervical region: Secondary | ICD-10-CM | POA: Insufficient documentation

## 2023-07-05 NOTE — Assessment & Plan Note (Signed)
 Onset ago, associated with Left arm tingling extends to left hand and fingers. Worse in lateral position, Resolves with repositioning to supine position. Rate discomfort at 3/10. No muscle weakness. Or limited ROM Denies any previous neck injury or surgery.  Unable to use NSAIDs due to CKD. He opted to wait on PT referral due to upcoming travel in May and June. Advised to get and ergonomic pillow.

## 2023-07-05 NOTE — Progress Notes (Signed)
 Acute Office Visit  Subjective:    Patient ID: Calvin [name]., male    DOB: [birth year], 73 y.o.   MRN: [id number]  Chief Complaint  Patient presents with   Numbness    Left hand numbness for 4-5 months tingling in fingers at night and has to get up and move arm to get relief    Back Pain    Mid back of right side for 4-5 months    Vaccines     Discuss vaccines for travel    Upcoming travel to [place] in May and [place] in June. He has received a yellow fever vaccine on 06/06/2023. He was also provided malarone and azithromycin  vaccine. He wants Hep. B, Varicella and MMR titer to determine if vaccines are needed. He plans to obtain TDAP from retail pharmacy  Cervical radiculopathy Onset ago, associated with Left arm tingling extends to left hand and fingers. Worse in lateral position, Resolves with repositioning to supine position. Rate discomfort at 3/10. No muscle weakness. Or limited ROM Denies any previous neck injury or surgery.  Unable to use NSAIDs due to CKD. He opted to wait on PT referral due to upcoming travel in May and June. Advised to get and ergonomic pillow.  Outpatient Medications Prior to Visit  Medication Sig   acetaminophen (TYLENOL) 325 MG tablet Take 650 mg by mouth every 6 (six) hours as needed.   allopurinol  (ZYLOPRIM ) 300 MG tablet TAKE 1 TABLET BY MOUTH DAILY   aspirin 81 MG tablet Take 81 mg by mouth daily.   clindamycin (CLEOCIN T) 1 % external solution Apply topically as directed.   diclofenac  Sodium (VOLTAREN ) 1 % GEL Apply topically 4 (four) times daily. Pt uses as needed.   fenofibrate  54 MG tablet TAKE 1 TABLET BY MOUTH DAILY   finasteride  (PROSCAR ) 5 MG tablet TAKE 1 TABLET BY MOUTH DAILY   FLUZONE HIGH-DOSE QUADRIVALENT 0.7 ML SUSY    irbesartan  (AVAPRO ) 300 MG tablet TAKE 1 TABLET BY MOUTH DAILY   Multiple Vitamins-Minerals (CENTRUM MEN PO) Take 1 tablet by mouth daily.   rosuvastatin  (CRESTOR ) 20 MG tablet TAKE 1 TABLET BY MOUTH  DAILY   SPIKEVAX syringe    spironolactone  (ALDACTONE ) 25 MG tablet Take 0.5 tablets (12.5 mg total) by mouth every morning.   tamsulosin  (FLOMAX ) 0.4 MG CAPS capsule TAKE 1 CAPSULE BY MOUTH DAILY   verapamil  (CALAN -SR) 240 MG CR tablet TAKE 1 TABLET BY MOUTH AT  BEDTIME   No facility-administered medications prior to visit.    Reviewed past medical and social history.  Review of Systems  Musculoskeletal:  Positive for back pain.   Per HPI     Objective:    Physical Exam Neck:     Thyroid : No thyroid  mass, thyromegaly or thyroid  tenderness.  Cardiovascular:     Rate and Rhythm: Normal rate.     Pulses: Normal pulses.  Pulmonary:     Effort: Pulmonary effort is normal.  Musculoskeletal:     Right shoulder: Normal.     Left shoulder: Normal.     Right upper arm: Normal.     Left upper arm: Normal.     Right elbow: Normal.     Left elbow: Normal.     Right forearm: Normal.     Left forearm: Normal.     Right wrist: Normal.     Left wrist: Normal.     Right hand: Normal.     Left hand: Normal.  Cervical back: Normal, normal range of motion and neck supple. No pain with movement, spinous process tenderness or muscular tenderness.     Thoracic back: Normal.     Lumbar back: Normal.     Right lower leg: No edema.     Left lower leg: No edema.  Lymphadenopathy:     Cervical: No cervical adenopathy.    BP 136/78 (BP Location: Right Arm, Patient Position: Sitting, Cuff Size: Large)   Pulse 70   Temp (!) 97.4 F (36.3 C) (Temporal)   Ht 5\' 8"  (1.727 m)   Wt 264 lb 3.2 oz (119.8 kg)   SpO2 98%   BMI 40.17 kg/m    No results found for any visits on 07/05/23.     Assessment & Plan:   Problem List Items Addressed This Visit     Cervical radiculopathy - Primary   Onset ago, associated with Left arm tingling extends to left hand and fingers. Worse in lateral position, Resolves with repositioning to supine position. Rate discomfort at 3/10. No muscle  weakness. Or limited ROM Denies any previous neck injury or surgery.  Unable to use NSAIDs due to CKD. He opted to wait on PT referral due to upcoming travel in May and June. Advised to get and ergonomic pillow.      Other Visit Diagnoses       Immunity to varicella determined by serologic test       Relevant Orders   Varicella zoster antibody, IgG     Immunity to measles, mumps, and rubella determined by serologic test       Relevant Orders   Measles/Mumps/Rubella Immunity     Immunity to hepatitis B virus demonstrated by serologic test       Relevant Orders   Hepatitis B Surface Antigen, Quantitative, Monitoring (Not for Diagnosis)     Muscle spasm          No orders of the defined types were placed in this encounter.  Return if symptoms worsen or fail to improve, for maintain upcoming appt.    Kathrene Parents, NP

## 2023-07-05 NOTE — Telephone Encounter (Signed)
 Noted. Dm/cma

## 2023-07-05 NOTE — Telephone Encounter (Signed)
 Chief Complaint: Left hand numbness Symptoms: right flank/back pain Frequency: x 4-5 months Pertinent Negatives: Patient denies weakness, numbness, dizziness, HA, vision changes Disposition: [] ED /[] Urgent Care (no appt availability in office) / [x] Appointment(In office/virtual)/ []  [facility]/ [] Home Care/ [] Refused Recommended Disposition /[] [facility]/ []  Follow-up with PCP Additional Notes: Pt reports he has been experiencing ongoing right side back pain and left hand numbness, he notes he mentioned this to the provider at last OV but it has since worsened and he is beginning to experiencing tingling in his hand in the morning after sleeping. OV scheduled. This RN educated pt on home care, new-worsening symptoms, when to call back/seek emergent care. Pt verbalized understanding and agrees to plan.    Copied from CRM [id number]. Topic: Clinical - Red Word Triage >> Jul 05, 2023 12:21 PM Kita Perish H wrote: [facility] that prompted transfer to Nurse Triage: Left hand numbness, nagging off and on pain on right side and back Reason for Disposition  Back pain (and neurologic deficit)  Answer Assessment - Initial Assessment Questions 1. SYMPTOM: "What is the main symptom you are concerned about?" (e.g., weakness, numbness)     Numbness 2. ONSET: "When did this start?" (minutes, hours, days; while sleeping)     X 4-5 months ago 4. PATTERN "Does this come and go, or has it been constant since it started?"  "Is it present now?"     Intermittent, primarily at night when lying down 5. CARDIAC SYMPTOMS: "Have you had any of the following symptoms: chest pain, difficulty breathing, palpitations?"     None 6. NEUROLOGIC SYMPTOMS: "Have you had any of the following symptoms: headache, dizziness, vision loss, double vision, changes in speech, unsteady on your feet?"     None  Protocols used: Neurologic Deficit-A-AH

## 2023-07-05 NOTE — Patient Instructions (Addendum)
 Use an ergonomic pillow Call office for PT referral when you return from travel. Maintain daily exercise: stretch before and after exercise. Go to lab  Cervical Radiculopathy  Cervical radiculopathy means that a nerve in the neck (a cervical nerve) is pinched or bruised. This can happen because of an injury to the cervical spine (vertebrae) in the neck, or as a normal part of getting older. This condition can cause pain or loss of feeling (numbness) that runs from your neck all the way down to your arm and fingers. Often, this condition gets better with rest. Treatment may be needed if the condition does not get better. What are the causes? A neck injury. A bulging disk in your spine. Sudden muscle tightening (muscle spasms). Tight muscles in your neck due to overuse. Arthritis. Breakdown in the bones and joints of the spine (spondylosis) due to getting older. Bone spurs that form near the nerves in the neck. What are the signs or symptoms? Pain. The pain may: Run from the neck to the arm and hand. Be very bad or irritating. Get worse when you move your neck. Loss of feeling or tingling in your arm or hand. Weakness in your arm or hand, in very bad cases. How is this treated? In many cases, treatment is not needed for this condition. With rest, the condition often gets better over time. If treatment is needed, options may include: Wearing a soft neck collar (cervical collar) for short periods of time. Doing exercises (physical therapy) to strengthen your neck muscles. Taking medicines. Having shots (injections) in your spine, in very bad cases. Having surgery. This may be needed if other treatments do not help. The type of surgery that is used will depend on the cause of your condition. Follow these instructions at home: If you have a soft neck collar: Wear it as told by your doctor. Take it off only as told by your doctor. Ask your doctor if you can take the collar off for cleaning  and bathing. If you are allowed to take the collar off for cleaning or bathing: Follow instructions from your doctor about how to take off the collar safely. Clean the collar by wiping it with mild soap and water and drying it completely. Take out any removable pads in the collar every 1-2 days. Wash them by hand with soap and water. Let them air-dry completely before you put them back in the collar. Check your skin under the collar for redness or sores. If you see any, tell your doctor. Managing pain     Take over-the-counter and prescription medicines only as told by your doctor. If told, put ice on the painful area. To do this: If you have a soft neck collar, take if off as told by your doctor. Put ice in a plastic bag. Place a towel between your skin and the bag. Leave the ice on for 20 minutes, 2-3 times a day. Take off the ice if your skin turns bright red. This is very important. If you cannot feel pain, heat, or cold, you have a greater risk of damage to the area. If using ice does not help, you can try using heat. Use the heat source that your doctor recommends, such as a moist heat pack or a heating pad. Place a towel between your skin and the heat source. Leave the heat on for 20-30 minutes. Take off the heat if your skin turns bright red. This is very important. If you cannot feel pain,  heat, or cold, you have a greater risk of getting burned. You may try a gentle neck and shoulder rub (massage). Activity Rest as needed. Return to your normal activities when your doctor says that it is safe. Do exercises as told by your doctor or physical therapist. You may have to avoid lifting. Ask your doctor how much you can safely lift. General instructions Use a flat pillow when you sleep. Do not drive while wearing a soft neck collar. If you do not have a soft neck collar, ask your doctor if it is safe to drive while your neck heals. Ask your doctor if you should avoid driving or using  machines while you are taking your medicine. Do not smoke or use any products that contain nicotine or tobacco. If you need help quitting, ask your doctor. Keep all follow-up visits. Contact a doctor if: Your condition does not get better with treatment. Get help right away if: Your pain gets worse and medicine does not help. You lose feeling or feel weak in your hand, arm, face, or leg. You have a high fever. Your neck is stiff. You cannot control when you poop or pee (have incontinence). You have trouble with walking, balance, or talking. Summary Cervical radiculopathy means that a nerve in the neck is pinched or bruised. A nerve can get pinched from a bulging disk, arthritis, an injury to the neck, or other causes. Symptoms include pain, tingling, or loss of feeling that goes from the neck to the arm or hand. Weakness in your arm or hand can happen in very bad cases. Treatment may include resting, wearing a soft neck collar, and doing exercises. You might need to take medicines for pain. In very bad cases, shots or surgery may be needed. This information is not intended to replace advice given to you by your health care provider. Make sure you discuss any questions you have with your health care provider. Document Revised: 09/02/2020 Document Reviewed: 09/02/2020 Elsevier Patient Education  2024 [employer].

## 2023-07-09 ENCOUNTER — Encounter: Payer: Self-pay | Admitting: Nurse Practitioner

## 2023-07-09 NOTE — Addendum Note (Signed)
 Addended by: Lovey Rudd on: 07/09/2023 03:38 PM   Modules accepted: Orders

## 2023-07-10 ENCOUNTER — Other Ambulatory Visit

## 2023-07-11 LAB — TEST AUTHORIZATION

## 2023-07-11 LAB — MEASLES/MUMPS/RUBELLA IMMUNITY
Mumps IgG: 106 [AU]/ml
Rubella: 28.9 {index}
Rubeola IgG: 102 [AU]/ml

## 2023-07-11 LAB — VARICELLA ZOSTER ANTIBODY, IGG: Varicella IgG: 21.6 {s_co_ratio}

## 2023-07-11 LAB — HEPATITIS B SURFACE ANTIGEN, QUANTITATIVE, MONITORING: HEPATITIS B SURFACE AG,QN MONITOR (NOT FOR DIAG): <0.05 [IU]/mL

## 2023-07-11 LAB — HEPATITIS B SURFACE ANTIBODY,QUALITATIVE: Hep B S Ab: NONREACTIVE

## 2023-07-12 ENCOUNTER — Encounter: Payer: Self-pay | Admitting: Nurse Practitioner

## 2023-07-13 ENCOUNTER — Other Ambulatory Visit: Payer: Self-pay | Admitting: Nurse Practitioner

## 2023-07-13 ENCOUNTER — Encounter: Payer: Self-pay | Admitting: Nurse Practitioner

## 2023-07-13 DIAGNOSIS — Z23 Encounter for immunization: Secondary | ICD-10-CM

## 2023-07-13 MED ORDER — ENGERIX-B 20 MCG/ML IJ SUSP: 1.0000 mL | Freq: Once | INTRAMUSCULAR | 0 refills | Status: AC

## 2023-07-13 NOTE — Telephone Encounter (Signed)
 Called and spoke to Calvin [name] and he stated that he and his wife went to the pharmacy to get the Hep vaccine and was told that in order for insurance to cover a Rx needs to be sent to the pharmacy by PCP.

## 2023-07-17 ENCOUNTER — Encounter: Payer: Self-pay | Admitting: Internal Medicine

## 2023-07-17 ENCOUNTER — Telehealth: Payer: Self-pay

## 2023-07-17 ENCOUNTER — Other Ambulatory Visit (HOSPITAL_COMMUNITY): Payer: Self-pay

## 2023-07-17 NOTE — Telephone Encounter (Signed)
        Office has been made aware. Nothing further needed

## 2023-07-17 NOTE — Telephone Encounter (Signed)
 Received Prior Authorization Needed fax from patient's pharmacy for the Engerix-B 1 mL IM Vial (Adult 20+).    Hep B vaccine

## 2023-07-18 NOTE — Telephone Encounter (Signed)
 Left voice message per DPR on file asking patient to give me a call back at the office at [phone].

## 2023-07-19 ENCOUNTER — Other Ambulatory Visit: Payer: Self-pay | Admitting: Nurse Practitioner

## 2023-07-19 DIAGNOSIS — E782 Mixed hyperlipidemia: Secondary | ICD-10-CM

## 2023-07-20 NOTE — Progress Notes (Unsigned)
    Calvin [name] D.[name] [address] [place] [address] Phone: [phone]   Assessment and Plan:     There are no diagnoses linked to this encounter.  ***   Pertinent previous records reviewed include ***    Follow Up: ***     Subjective:   I, Calvin [name], am serving as a Neurosurgeon for Doctor Ulysees Gander  Chief Complaint: bilat knee pain   HPI:   07/23/2023 Patient is a 73 year old male with bilat knee pain. Patient states   Relevant Historical Information: ***  Additional pertinent review of systems negative.   Current Outpatient Medications:    acetaminophen (TYLENOL) 325 MG tablet, Take 650 mg by mouth every 6 (six) hours as needed., Disp: , Rfl:    allopurinol  (ZYLOPRIM ) 300 MG tablet, TAKE 1 TABLET BY MOUTH DAILY, Disp: 100 tablet, Rfl: 1   aspirin 81 MG tablet, Take 81 mg by mouth daily., Disp: , Rfl:    clindamycin (CLEOCIN T) 1 % external solution, Apply topically as directed., Disp: , Rfl:    diclofenac  Sodium (VOLTAREN ) 1 % GEL, Apply topically 4 (four) times daily. Pt uses as needed., Disp: , Rfl:    fenofibrate  54 MG tablet, TAKE 1 TABLET BY MOUTH DAILY, Disp: 100 tablet, Rfl: 2   finasteride  (PROSCAR ) 5 MG tablet, TAKE 1 TABLET BY MOUTH DAILY, Disp: 100 tablet, Rfl: 1   FLUZONE HIGH-DOSE QUADRIVALENT 0.7 ML SUSY, , Disp: , Rfl:    irbesartan  (AVAPRO ) 300 MG tablet, TAKE 1 TABLET BY MOUTH DAILY, Disp: 100 tablet, Rfl: 1   Multiple Vitamins-Minerals (CENTRUM MEN PO), Take 1 tablet by mouth daily., Disp: , Rfl:    rosuvastatin  (CRESTOR ) 20 MG tablet, TAKE 1 TABLET BY MOUTH DAILY, Disp: 100 tablet, Rfl: 1   SPIKEVAX syringe, , Disp: , Rfl:    spironolactone  (ALDACTONE ) 25 MG tablet, Take 0.5 tablets (12.5 mg total) by mouth every morning., Disp: 15 tablet, Rfl: 5   tamsulosin  (FLOMAX ) 0.4 MG CAPS capsule, TAKE 1 CAPSULE BY MOUTH DAILY, Disp: 100 capsule, Rfl: 1   verapamil  (CALAN -SR) 240 MG CR tablet, TAKE 1  TABLET BY MOUTH AT  BEDTIME, Disp: 100 tablet, Rfl: 2   Objective:     There were no vitals filed for this visit.    There is no height or weight on file to calculate BMI.    Physical Exam:    ***   Electronically signed by:  Marshall Skeeter D.[name] 11:39 AM 07/20/23

## 2023-07-23 ENCOUNTER — Ambulatory Visit: Admitting: Sports Medicine

## 2023-07-23 VITALS — BP 126/78 | HR 81 | Ht 68.0 in | Wt 266.0 lb

## 2023-07-23 DIAGNOSIS — M170 Bilateral primary osteoarthritis of knee: Secondary | ICD-10-CM

## 2023-07-23 DIAGNOSIS — G5602 Carpal tunnel syndrome, left upper limb: Secondary | ICD-10-CM

## 2023-07-23 DIAGNOSIS — M25562 Pain in left knee: Secondary | ICD-10-CM | POA: Diagnosis not present

## 2023-07-23 DIAGNOSIS — R6889 Other general symptoms and signs: Secondary | ICD-10-CM | POA: Diagnosis not present

## 2023-07-23 DIAGNOSIS — G8929 Other chronic pain: Secondary | ICD-10-CM | POA: Diagnosis not present

## 2023-07-23 DIAGNOSIS — M25561 Pain in right knee: Secondary | ICD-10-CM | POA: Diagnosis not present

## 2023-07-23 NOTE — Patient Instructions (Addendum)
 Injected knees today. Carpal tunnel HEP. Follow up as needed.

## 2023-08-24 ENCOUNTER — Other Ambulatory Visit: Payer: Self-pay | Admitting: Nurse Practitioner

## 2023-08-24 DIAGNOSIS — N401 Enlarged prostate with lower urinary tract symptoms: Secondary | ICD-10-CM

## 2023-08-28 DIAGNOSIS — J301 Allergic rhinitis due to pollen: Secondary | ICD-10-CM | POA: Diagnosis not present

## 2023-08-28 DIAGNOSIS — G4733 Obstructive sleep apnea (adult) (pediatric): Secondary | ICD-10-CM | POA: Diagnosis not present

## 2023-08-31 ENCOUNTER — Other Ambulatory Visit: Payer: Self-pay | Admitting: Nurse Practitioner

## 2023-08-31 DIAGNOSIS — N401 Enlarged prostate with lower urinary tract symptoms: Secondary | ICD-10-CM

## 2023-09-07 ENCOUNTER — Other Ambulatory Visit: Payer: Self-pay | Admitting: Nurse Practitioner

## 2023-09-07 DIAGNOSIS — I10 Essential (primary) hypertension: Secondary | ICD-10-CM

## 2023-09-14 ENCOUNTER — Other Ambulatory Visit: Payer: Self-pay | Admitting: Medical Genetics

## 2023-09-17 ENCOUNTER — Ambulatory Visit (INDEPENDENT_AMBULATORY_CARE_PROVIDER_SITE_OTHER): Payer: Medicare Other | Admitting: Nurse Practitioner

## 2023-09-17 ENCOUNTER — Encounter: Payer: Self-pay | Admitting: Nurse Practitioner

## 2023-09-17 VITALS — BP 126/74 | HR 64 | Temp 97.6°F | Ht 68.0 in | Wt 250.8 lb

## 2023-09-17 DIAGNOSIS — E782 Mixed hyperlipidemia: Secondary | ICD-10-CM | POA: Diagnosis not present

## 2023-09-17 DIAGNOSIS — R5383 Other fatigue: Secondary | ICD-10-CM | POA: Diagnosis not present

## 2023-09-17 DIAGNOSIS — M79652 Pain in left thigh: Secondary | ICD-10-CM | POA: Diagnosis not present

## 2023-09-17 DIAGNOSIS — N1832 Chronic kidney disease, stage 3b: Secondary | ICD-10-CM

## 2023-09-17 DIAGNOSIS — G4733 Obstructive sleep apnea (adult) (pediatric): Secondary | ICD-10-CM | POA: Diagnosis not present

## 2023-09-17 DIAGNOSIS — R739 Hyperglycemia, unspecified: Secondary | ICD-10-CM

## 2023-09-17 DIAGNOSIS — I10 Essential (primary) hypertension: Secondary | ICD-10-CM

## 2023-09-17 DIAGNOSIS — E66812 Obesity, class 2: Secondary | ICD-10-CM

## 2023-09-17 DIAGNOSIS — Z6838 Body mass index (BMI) 38.0-38.9, adult: Secondary | ICD-10-CM

## 2023-09-17 DIAGNOSIS — M79651 Pain in right thigh: Secondary | ICD-10-CM | POA: Diagnosis not present

## 2023-09-17 LAB — CBC
HCT: 27.1 % — ABNORMAL LOW (ref 39.0–52.0)
Hemoglobin: 9 g/dL — ABNORMAL LOW (ref 13.0–17.0)
MCHC: 33.4 g/dL (ref 30.0–36.0)
MCV: 93.8 fl (ref 78.0–100.0)
Platelets: 113 K/uL — ABNORMAL LOW (ref 150.0–400.0)
RBC: 2.89 Mil/uL — ABNORMAL LOW (ref 4.22–5.81)
RDW: 15.1 % (ref 11.5–15.5)
WBC: 3.5 K/uL — ABNORMAL LOW (ref 4.0–10.5)

## 2023-09-17 LAB — COMPREHENSIVE METABOLIC PANEL WITH GFR
ALT: 15 U/L (ref 0–53)
AST: 24 U/L (ref 0–37)
Albumin: 4.4 g/dL (ref 3.5–5.2)
Alkaline Phosphatase: 28 U/L — ABNORMAL LOW (ref 39–117)
BUN: 34 mg/dL — ABNORMAL HIGH (ref 6–23)
CO2: 22 meq/L (ref 19–32)
Calcium: 9 mg/dL (ref 8.4–10.5)
Chloride: 114 meq/L — ABNORMAL HIGH (ref 96–112)
Creatinine, Ser: 2.44 mg/dL — ABNORMAL HIGH (ref 0.40–1.50)
GFR: 25.32 mL/min — ABNORMAL LOW (ref >60.00)
Glucose, Bld: 96 mg/dL (ref 70–99)
Potassium: 6 meq/L — ABNORMAL HIGH (ref 3.5–5.1)
Sodium: 139 meq/L (ref 135–145)
Total Bilirubin: 0.5 mg/dL (ref 0.2–1.2)
Total Protein: 7.1 g/dL (ref 6.0–8.3)

## 2023-09-17 LAB — LIPID PANEL
Cholesterol: 102 mg/dL (ref 0–200)
HDL: 27.7 mg/dL — ABNORMAL LOW (ref >39.00)
LDL Cholesterol: 59 mg/dL (ref 0–99)
NonHDL: 74.26
Total CHOL/HDL Ratio: 4
Triglycerides: 77 mg/dL (ref 0.0–149.0)
VLDL: 15.4 mg/dL (ref 0.0–40.0)

## 2023-09-17 LAB — TSH: TSH: 1 u[IU]/mL (ref 0.35–5.50)

## 2023-09-17 LAB — HEMOGLOBIN A1C: Hgb A1c MFr Bld: 5.8 % (ref 4.6–6.5)

## 2023-09-17 NOTE — Assessment & Plan Note (Signed)
 Reports Intermittent lightheadedness, occurs with rapid change in position: laying to standing or sitting to standing. Onset 2months ago, each episode last <70mins, no syncope, no vertigo BP in AM 124/67,  BP 1hr after meds: 92/53 (experienced light headedness) BP Readings from Last 3 Encounters:  09/17/23 126/74  07/23/23 126/78  07/05/23 136/78    D/c spironolactone  Maintain irbesartan  and verapamil  doses He is to call office if BP>140/80 F/up in 15month

## 2023-09-17 NOTE — Progress Notes (Signed)
 Established Patient Visit  Patient: Calvin [name].   DOB: 1950/10/23   73 y.o. Male  MRN: [id number] Visit Date: 09/17/2023  Subjective:    Chief Complaint  Patient presents with   Follow-up    (FASTING) 6 month follow up for HT, Hyperlipidemia    HPI Essential hypertension Reports Intermittent lightheadedness, occurs with rapid change in position: laying to standing or sitting to standing. Onset 2months ago, each episode last <25mins, no syncope, no vertigo BP in AM 124/67,  BP 1hr after meds: 92/53 (experienced light headedness) BP Readings from Last 3 Encounters:  09/17/23 126/74  07/23/23 126/78  07/05/23 136/78    D/c spironolactone  Maintain irbesartan  and verapamil  doses He is to call office if BP>140/80 F/up in 49month  CKD (chronic kidney disease) stage 3, GFR 30-59 ml/min (HCC) Repeat CMP  Pain in both thighs Chronic, onset 2years ago, gradually worsening, describes as Bilateral paresthesia in anterior thighs with standing >66mins. Started 84yrs ago, intermittent, denies any back pain or hip pain or unsteady gait.  Possible Lumbar radiculopathy? Agreed to outpatient PT Encourage to start home core exercises and/or yoga  Mixed hyperlipidemia No adverse effects with crestor  and fenofibrate  Repeat lipid panel  Hyperglycemia Repeat hgbA1c  Obesity Has made additional diet changes after appt with nutritionist. Struggles with consistent exercise regimen Lost16lbs in last 2months due to increased mobility during international travels  Wt Readings from Last 3 Encounters:  09/17/23 250 lb 12.8 oz (113.8 kg)  07/23/23 266 lb (120.7 kg)  07/05/23 264 lb 3.2 oz (119.8 kg)   Encouraged to maintain daily exercise: yoga, water aerobics, walking, yoga, chair exercises  etc   Reviewed medical, surgical, and social history today  Medications: Outpatient Medications Prior to Visit  Medication Sig   acetaminophen (TYLENOL) 325 MG tablet Take 650 mg by  mouth every 6 (six) hours as needed.   allopurinol  (ZYLOPRIM ) 300 MG tablet TAKE 1 TABLET BY MOUTH DAILY   aspirin 81 MG tablet Take 81 mg by mouth daily.   atovaquone-proguanil (MALARONE) 250-100 MG TABS tablet Take 1 tablet by mouth daily. Take as instructed   diclofenac  Sodium (VOLTAREN ) 1 % GEL Apply topically 4 (four) times daily. Pt uses as needed.   fenofibrate  54 MG tablet TAKE 1 TABLET BY MOUTH DAILY   finasteride  (PROSCAR ) 5 MG tablet TAKE 1 TABLET BY MOUTH DAILY   irbesartan  (AVAPRO ) 300 MG tablet TAKE 1 TABLET BY MOUTH DAILY   rosuvastatin  (CRESTOR ) 20 MG tablet TAKE 1 TABLET BY MOUTH DAILY   tamsulosin  (FLOMAX ) 0.4 MG CAPS capsule TAKE 1 CAPSULE BY MOUTH DAILY   verapamil  (CALAN -SR) 240 MG CR tablet TAKE 1 TABLET BY MOUTH AT  BEDTIME   [DISCONTINUED] spironolactone  (ALDACTONE ) 25 MG tablet Take 0.5 tablets (12.5 mg total) by mouth every morning.   SPIKEVAX syringe    [DISCONTINUED] clindamycin (CLEOCIN T) 1 % external solution Apply topically as directed. (Patient not taking: Reported on 09/17/2023)   [DISCONTINUED] FLUZONE HIGH-DOSE QUADRIVALENT 0.7 ML SUSY    [DISCONTINUED] Multiple Vitamins-Minerals (CENTRUM MEN PO) Take 1 tablet by mouth daily. (Patient not taking: Reported on 09/17/2023)   No facility-administered medications prior to visit.   Reviewed past medical and social history.   ROS per HPI above      Objective:  BP 126/74 (BP Location: Left Arm, Patient Position: Sitting, Cuff Size: Large)   Pulse 64   Temp 97.6 F (36.4  C) (Oral)   Ht 5' 8 (1.727 m)   Wt 250 lb 12.8 oz (113.8 kg)   SpO2 98%   BMI 38.13 kg/m      Physical Exam Vitals and nursing note reviewed.  Cardiovascular:     Rate and Rhythm: Normal rate and regular rhythm.     Pulses: Normal pulses.     Heart sounds: Normal heart sounds.  Pulmonary:     Effort: Pulmonary effort is normal.     Breath sounds: Normal breath sounds.  Musculoskeletal:     Lumbar back: Normal.     Right hip:  Normal.     Left hip: Normal.     Right upper leg: Normal.     Left upper leg: Normal.     Comments: No muscle atrophy noted  Neurological:     Mental Status: He is alert and oriented to person, place, and time.     No results found for any visits on 09/17/23.    Assessment & Plan:    Problem List Items Addressed This Visit     CKD (chronic kidney disease) stage 3, GFR 30-59 ml/min (HCC)   Repeat CMP      Relevant Orders   Comprehensive metabolic panel with GFR   Essential hypertension - Primary   Reports Intermittent lightheadedness, occurs with rapid change in position: laying to standing or sitting to standing. Onset 2months ago, each episode last <52mins, no syncope, no vertigo BP in AM 124/67,  BP 1hr after meds: 92/53 (experienced light headedness) BP Readings from Last 3 Encounters:  09/17/23 126/74  07/23/23 126/78  07/05/23 136/78    D/c spironolactone  Maintain irbesartan  and verapamil  doses He is to call office if BP>140/80 F/up in 13month      Relevant Orders   Comprehensive metabolic panel with GFR   Hyperglycemia   Repeat hgbA1c      Relevant Orders   Hemoglobin A1c   Comprehensive metabolic panel with GFR   Mixed hyperlipidemia   No adverse effects with crestor  and fenofibrate  Repeat lipid panel      Relevant Orders   Lipid panel   Obesity   Has made additional diet changes after appt with nutritionist. Struggles with consistent exercise regimen Lost16lbs in last 2months due to increased mobility during international travels  Wt Readings from Last 3 Encounters:  09/17/23 250 lb 12.8 oz (113.8 kg)  07/23/23 266 lb (120.7 kg)  07/05/23 264 lb 3.2 oz (119.8 kg)   Encouraged to maintain daily exercise: yoga, water aerobics, walking, yoga, chair exercises  etc      Pain in both thighs   Chronic, onset 2years ago, gradually worsening, describes as Bilateral paresthesia in anterior thighs with standing >45mins. Started 22yrs ago, intermittent,  denies any back pain or hip pain or unsteady gait.  Possible Lumbar radiculopathy? Agreed to outpatient PT Encourage to start home core exercises and/or yoga      Relevant Orders   Ambulatory referral to Physical Therapy   Other Visit Diagnoses       Other fatigue       Relevant Orders   Comprehensive metabolic panel with GFR   CBC   TSH      Return in about 4 weeks (around 10/15/2023) for HTN.     Roselie Mood, NP

## 2023-09-17 NOTE — Assessment & Plan Note (Addendum)
 Has made additional diet changes after appt with nutritionist. Struggles with consistent exercise regimen Lost16lbs in last 2months due to increased mobility during international travels  Wt Readings from Last 3 Encounters:  09/17/23 250 lb 12.8 oz (113.8 kg)  07/23/23 266 lb (120.7 kg)  07/05/23 264 lb 3.2 oz (119.8 kg)   Encouraged to maintain daily exercise: yoga, water aerobics, walking, yoga, chair exercises  etc

## 2023-09-17 NOTE — Assessment & Plan Note (Signed)
 Repeat CMP

## 2023-09-17 NOTE — Assessment & Plan Note (Signed)
 Repeat hgbA1c

## 2023-09-17 NOTE — Patient Instructions (Addendum)
 Stop spironolactone . Continue to monitor BP in AM Call office or send mychart message if BP >140/80. Maintain other med doses Go to lab

## 2023-09-17 NOTE — Assessment & Plan Note (Signed)
No adverse effects with crestor and fenofibrate Repeat lipid panel

## 2023-09-17 NOTE — Assessment & Plan Note (Signed)
 Chronic, onset 2years ago, gradually worsening, describes as Bilateral paresthesia in anterior thighs with standing >53mins. Started 44yrs ago, intermittent, denies any back pain or hip pain or unsteady gait.  Possible Lumbar radiculopathy? Agreed to outpatient PT Encourage to start home core exercises and/or yoga

## 2023-09-18 ENCOUNTER — Ambulatory Visit: Payer: Self-pay | Admitting: Nurse Practitioner

## 2023-09-18 DIAGNOSIS — R5383 Other fatigue: Secondary | ICD-10-CM

## 2023-09-18 DIAGNOSIS — D500 Iron deficiency anemia secondary to blood loss (chronic): Secondary | ICD-10-CM

## 2023-09-18 DIAGNOSIS — N1832 Chronic kidney disease, stage 3b: Secondary | ICD-10-CM

## 2023-09-19 NOTE — Telephone Encounter (Addendum)
 Called patient and left a voice message on both his mobile and home numbers per DPR on file asking to give me a call back at the office at [phone] it's pertaining to recent labs from office visit. I will try calling patient again.  ----- Message from [facility] sent at 09/18/2023  8:05 PM EDT ----- Decline in renal function and increase in potassium: stop malarone, maintain at lease 64 oz of water intake daily. Avoid all OVER THE COUNTER NSAIDs (ibuprofen, aleve , motrin, advil, naproxen ) Mild decline in hgb, hct, platelet, and RBC. Normal Tsh and hgbA1c Schedule lab appointment to repeat renal function in 1week.   ----- Message ----- From: Interface, [facility] Sent: 09/17/2023   4:05 PM EDT To: Roselie Bishop Mood, NP

## 2023-09-25 DIAGNOSIS — L853 Xerosis cutis: Secondary | ICD-10-CM | POA: Diagnosis not present

## 2023-09-25 DIAGNOSIS — L299 Pruritus, unspecified: Secondary | ICD-10-CM | POA: Diagnosis not present

## 2023-09-25 DIAGNOSIS — L720 Epidermal cyst: Secondary | ICD-10-CM | POA: Diagnosis not present

## 2023-09-25 DIAGNOSIS — L821 Other seborrheic keratosis: Secondary | ICD-10-CM | POA: Diagnosis not present

## 2023-09-25 DIAGNOSIS — L738 Other specified follicular disorders: Secondary | ICD-10-CM | POA: Diagnosis not present

## 2023-09-26 ENCOUNTER — Other Ambulatory Visit (INDEPENDENT_AMBULATORY_CARE_PROVIDER_SITE_OTHER)

## 2023-09-26 DIAGNOSIS — N1832 Chronic kidney disease, stage 3b: Secondary | ICD-10-CM | POA: Diagnosis not present

## 2023-09-26 DIAGNOSIS — R5383 Other fatigue: Secondary | ICD-10-CM | POA: Diagnosis not present

## 2023-09-26 DIAGNOSIS — Z0184 Encounter for antibody response examination: Secondary | ICD-10-CM | POA: Diagnosis not present

## 2023-09-26 DIAGNOSIS — D500 Iron deficiency anemia secondary to blood loss (chronic): Secondary | ICD-10-CM | POA: Diagnosis not present

## 2023-09-26 LAB — RENAL FUNCTION PANEL
Albumin: 4.3 g/dL (ref 3.5–5.2)
BUN: 37 mg/dL — ABNORMAL HIGH (ref 6–23)
CO2: 23 meq/L (ref 19–32)
Calcium: 9 mg/dL (ref 8.4–10.5)
Chloride: 110 meq/L (ref 96–112)
Creatinine, Ser: 2.15 mg/dL — ABNORMAL HIGH (ref 0.40–1.50)
GFR: 29.46 mL/min — ABNORMAL LOW (ref >60.00)
Glucose, Bld: 106 mg/dL — ABNORMAL HIGH (ref 70–99)
Phosphorus: 3.3 mg/dL (ref 2.3–4.6)
Potassium: 4.6 meq/L (ref 3.5–5.1)
Sodium: 138 meq/L (ref 135–145)

## 2023-09-26 LAB — CBC
HCT: 27.4 % — ABNORMAL LOW (ref 39.0–52.0)
Hemoglobin: 9 g/dL — ABNORMAL LOW (ref 13.0–17.0)
MCHC: 32.8 g/dL (ref 30.0–36.0)
MCV: 95 fl (ref 78.0–100.0)
Platelets: 78 K/uL — ABNORMAL LOW (ref 150.0–400.0)
RBC: 2.88 Mil/uL — ABNORMAL LOW (ref 4.22–5.81)
RDW: 14.9 % (ref 11.5–15.5)
WBC: 3.7 K/uL — ABNORMAL LOW (ref 4.0–10.5)

## 2023-09-27 ENCOUNTER — Other Ambulatory Visit

## 2023-09-27 ENCOUNTER — Ambulatory Visit: Payer: Self-pay | Admitting: Nurse Practitioner

## 2023-09-27 DIAGNOSIS — Z006 Encounter for examination for normal comparison and control in clinical research program: Secondary | ICD-10-CM

## 2023-09-27 LAB — HEPATITIS B SURFACE ANTIBODY, QUANTITATIVE: Hep B S AB Quant (Post): <5 m[IU]/mL — ABNORMAL LOW (ref >=10)

## 2023-09-28 ENCOUNTER — Encounter: Payer: Self-pay | Admitting: Hematology and Oncology

## 2023-10-02 ENCOUNTER — Encounter: Payer: Self-pay | Admitting: Nurse Practitioner

## 2023-10-02 ENCOUNTER — Telehealth: Payer: Self-pay | Admitting: Hematology and Oncology

## 2023-10-02 ENCOUNTER — Other Ambulatory Visit: Payer: Self-pay | Admitting: Nurse Practitioner

## 2023-10-02 ENCOUNTER — Other Ambulatory Visit: Payer: Self-pay | Admitting: Hematology and Oncology

## 2023-10-02 DIAGNOSIS — I10 Essential (primary) hypertension: Secondary | ICD-10-CM

## 2023-10-02 DIAGNOSIS — D472 Monoclonal gammopathy: Secondary | ICD-10-CM

## 2023-10-02 DIAGNOSIS — M1A0620 Idiopathic chronic gout, left knee, without tophus (tophi): Secondary | ICD-10-CM

## 2023-10-02 DIAGNOSIS — E782 Mixed hyperlipidemia: Secondary | ICD-10-CM

## 2023-10-04 ENCOUNTER — Inpatient Hospital Stay: Attending: Hematology and Oncology

## 2023-10-04 DIAGNOSIS — D696 Thrombocytopenia, unspecified: Secondary | ICD-10-CM | POA: Insufficient documentation

## 2023-10-04 DIAGNOSIS — D509 Iron deficiency anemia, unspecified: Secondary | ICD-10-CM | POA: Insufficient documentation

## 2023-10-04 DIAGNOSIS — D72819 Decreased white blood cell count, unspecified: Secondary | ICD-10-CM | POA: Insufficient documentation

## 2023-10-04 DIAGNOSIS — D472 Monoclonal gammopathy: Secondary | ICD-10-CM | POA: Insufficient documentation

## 2023-10-04 LAB — FOLATE: Folate: 10.5 ng/mL (ref >5.9)

## 2023-10-04 LAB — CBC WITH DIFFERENTIAL (CANCER CENTER ONLY)
Abs Immature Granulocytes: 0.02 K/uL (ref 0.00–0.07)
Basophils Absolute: 0 K/uL (ref 0.0–0.1)
Basophils Relative: 1 %
Eosinophils Absolute: 0 K/uL (ref 0.0–0.5)
Eosinophils Relative: 1 %
HCT: 26.8 % — ABNORMAL LOW (ref 39.0–52.0)
Hemoglobin: 8.9 g/dL — ABNORMAL LOW (ref 13.0–17.0)
Immature Granulocytes: 1 %
Lymphocytes Relative: 33 %
Lymphs Abs: 1.1 K/uL (ref 0.7–4.0)
MCH: 30.9 pg (ref 26.0–34.0)
MCHC: 33.2 g/dL (ref 30.0–36.0)
MCV: 93.1 fL (ref 80.0–100.0)
Monocytes Absolute: 0.3 K/uL (ref 0.1–1.0)
Monocytes Relative: 9 %
Neutro Abs: 1.9 K/uL (ref 1.7–7.7)
Neutrophils Relative %: 55 %
Platelet Count: 125 K/uL — ABNORMAL LOW (ref 150–400)
RBC: 2.88 MIL/uL — ABNORMAL LOW (ref 4.22–5.81)
RDW: 14.1 % (ref 11.5–15.5)
WBC Count: 3.4 K/uL — ABNORMAL LOW (ref 4.0–10.5)
nRBC: 0 % (ref 0.0–0.2)

## 2023-10-04 LAB — RETIC PANEL
Immature Retic Fract: 24.3 % — ABNORMAL HIGH (ref 2.3–15.9)
RBC.: 2.87 MIL/uL — ABNORMAL LOW (ref 4.22–5.81)
Retic Count, Absolute: 69.7 K/uL (ref 19.0–186.0)
Retic Ct Pct: 2.4 % (ref 0.4–3.1)
Reticulocyte Hemoglobin: 31.7 pg (ref >27.9)

## 2023-10-04 LAB — IRON AND IRON BINDING CAPACITY (CC-WL,HP ONLY)
Iron: 67 ug/dL (ref 45–182)
Saturation Ratios: 15 % — ABNORMAL LOW (ref 17.9–39.5)
TIBC: 452 ug/dL — ABNORMAL HIGH (ref 250–450)
UIBC: 385 ug/dL — ABNORMAL HIGH (ref 117–376)

## 2023-10-04 LAB — SAMPLE TO BLOOD BANK

## 2023-10-04 LAB — CMP (CANCER CENTER ONLY)
ALT: 16 U/L (ref 0–44)
AST: 22 U/L (ref 15–41)
Albumin: 4.1 g/dL (ref 3.5–5.0)
Alkaline Phosphatase: 30 U/L — ABNORMAL LOW (ref 38–126)
Anion gap: 5 (ref 5–15)
BUN: 34 mg/dL — ABNORMAL HIGH (ref 8–23)
CO2: 24 mmol/L (ref 22–32)
Calcium: 9 mg/dL (ref 8.9–10.3)
Chloride: 113 mmol/L — ABNORMAL HIGH (ref 98–111)
Creatinine: 2.03 mg/dL — ABNORMAL HIGH (ref 0.61–1.24)
GFR, Estimated: 34 mL/min — ABNORMAL LOW (ref >60)
Glucose, Bld: 93 mg/dL (ref 70–99)
Potassium: 4.3 mmol/L (ref 3.5–5.1)
Sodium: 142 mmol/L (ref 135–145)
Total Bilirubin: 0.4 mg/dL (ref 0.0–1.2)
Total Protein: 6.8 g/dL (ref 6.5–8.1)

## 2023-10-04 LAB — LACTATE DEHYDROGENASE: LDH: 135 U/L (ref 98–192)

## 2023-10-04 LAB — FERRITIN: Ferritin: 30 ng/mL (ref 24–336)

## 2023-10-04 LAB — VITAMIN B12: Vitamin B-12: 236 pg/mL (ref 180–914)

## 2023-10-05 LAB — KAPPA/LAMBDA LIGHT CHAINS
Kappa free light chain: 52.4 mg/L — ABNORMAL HIGH (ref 3.3–19.4)
Kappa, lambda light chain ratio: 2.47 — ABNORMAL HIGH (ref 0.26–1.65)
Lambda free light chains: 21.2 mg/L (ref 5.7–26.3)

## 2023-10-07 LAB — MULTIPLE MYELOMA PANEL, SERUM
Albumin SerPl Elph-Mcnc: 3.9 g/dL (ref 2.9–4.4)
Albumin/Glob SerPl: 1.6 (ref 0.7–1.7)
Alpha 1: 0.2 g/dL (ref 0.0–0.4)
Alpha2 Glob SerPl Elph-Mcnc: 0.6 g/dL (ref 0.4–1.0)
B-Globulin SerPl Elph-Mcnc: 1 g/dL (ref 0.7–1.3)
Gamma Glob SerPl Elph-Mcnc: 0.9 g/dL (ref 0.4–1.8)
Globulin, Total: 2.6 g/dL (ref 2.2–3.9)
IgA: 139 mg/dL (ref 61–437)
IgG (Immunoglobin G), Serum: 1055 mg/dL (ref 603–1613)
IgM (Immunoglobulin M), Srm: 85 mg/dL (ref 15–143)
M Protein SerPl Elph-Mcnc: 0.2 g/dL — ABNORMAL HIGH
Total Protein ELP: 6.5 g/dL (ref 6.0–8.5)

## 2023-10-07 LAB — METHYLMALONIC ACID, SERUM: Methylmalonic Acid, Quantitative: 183 nmol/L (ref 0–378)

## 2023-10-08 LAB — GENECONNECT MOLECULAR SCREEN: Genetic Analysis Overall Interpretation: NEGATIVE

## 2023-10-08 NOTE — Therapy (Signed)
 OUTPATIENT PHYSICAL THERAPY LOWER EXTREMITY EVALUATION   Patient Name: Calvin [name]. MRN: [id number] DOB:1950-11-17, 73 y.o., male Today's Date: 10/09/2023  END OF SESSION:  PT End of Session - 10/09/23 1017     Visit Number 1    Authorization Type [employer]    PT Start Time 1015    PT Stop Time 1100    PT Time Calculation (min) 45 min          Past Medical History:  Diagnosis Date   Allergy    Arthritis    Degenerative TFCC tear, right 11/08/2017   Injected November 08, 2017   Diverticulosis    Gout    Hyperlipidemia    Hypertension    Iron deficiency anemia 09/20/2018   OSA (obstructive sleep apnea) 11/13/2013   Sickle cell trait (HCC)    Sleep apnea    Urinary frequency 07/20/2012   Urinary retention 07/20/2012   Past Surgical History:  Procedure Laterality Date   COLONOSCOPY     POLYPECTOMY     ROTATOR CUFF REPAIR     left   THROAT SURGERY     polyps removed from vocal cord   Patient Active Problem List   Diagnosis Date Noted   Pain in both thighs 09/17/2023   Seasonal allergic rhinitis due to pollen 08/28/2023   Cervical radiculopathy 07/05/2023   Sickle cell trait (HCC) 02/28/2023   CKD (chronic kidney disease) stage 3, GFR 30-59 ml/min (HCC) 08/28/2022   Obesity 02/02/2020   Monoclonal gammopathies 01/23/2019   Iron deficiency anemia 09/20/2018   Degenerative arthritis of knee, bilateral 12/17/2017   Carpal tunnel syndrome on right 08/31/2017   Degenerative joint disease of knee, left 08/03/2017   Right wrist pain 07/17/2017   Sinus bradycardia 05/04/2016   Hyperglycemia 05/04/2016   Left rotator cuff tear 11/23/2015   Bursitis of right shoulder 03/03/2015   Acute meniscal tear of left knee 12/10/2013   Localized osteoarthrosis, lower leg 12/10/2013   OSA (obstructive sleep apnea) 11/13/2013   BPH without obstruction/lower urinary tract symptoms 04/02/2013   Bilateral knee pain 11/13/2012   Left knee pain 11/13/2012   Chronic gout of knee due  to drug 11/13/2012   LIBIDO, DECREASED 05/26/2009   BACK PAIN 04/01/2009   GOUT 10/30/2007   Mixed hyperlipidemia 09/04/2007   Nocturia 09/04/2007   CPAP rhinitis 05/01/2007   Venous insufficiency of both lower extremities 03/09/2007   DIVERTICULOSIS, COLON 03/09/2007   History of colonic polyps 03/09/2007   Essential hypertension 10/31/2006    PCP: Roselie Mood  REFERRING PROVIDER: Roselie Mood  REFERRING DIAG:  [phone] (ICD-10-CM) - Pain in both thighs    THERAPY DIAG:  No diagnosis found.  Rationale for Evaluation and Treatment: Rehabilitation  ONSET DATE: about 2 years  SUBJECTIVE:   SUBJECTIVE STATEMENT: When I stand up for a lengthy period of time like in church my thighs would go numb. It is temporary and would go away when I sat or walked.   PERTINENT HISTORY: Chronic, onset 2years ago, gradually worsening, describes as Bilateral paresthesia in anterior thighs with standing >19mins. Started 68yrs ago, intermittent, denies any back pain or hip pain or unsteady gait.   CKD, HTN, Obesity   PAIN:  Are you having pain? No  PRECAUTIONS: None  RED FLAGS: None   WEIGHT BEARING RESTRICTIONS: No  FALLS:  Has patient fallen in last 6 months? No  LIVING ENVIRONMENT: Lives with: lives with their family Lives in: House/apartment Stairs: Yes: Internal: 1 flight steps;  on right going up  PLOF: Independent  PATIENT GOALS:   NEXT MD VISIT:   OBJECTIVE:  Note: Objective measures were completed at Evaluation unless otherwise noted.  DIAGNOSTIC FINDINGS: 03/14/22--Tricompartmental degenerative changes with small joint effusion.   COGNITION: Overall cognitive status: Within functional limits for tasks assessed     SENSATION: WFL  MUSCLE LENGTH: Some tightness in piriformis, quads, and hamstrings  POSTURE: No Significant postural limitations  PALPATION: No TTP   LOWER EXTREMITY ROM: all WNL   LOWER EXTREMITY MMT: 5/5 BLE   FUNCTIONAL  TESTS:  5 times sit to stand: 13.02s  GAIT: Distance walked: in clinic distances Assistive device utilized: None Level of assistance: Complete Independence Comments: no remarkable findings                                                                                         TREATMENT DATE: 10/09/23- EVAL    PATIENT EDUCATION:  Education details: HEP Person educated: Patient Education method: Explanation Education comprehension: verbalized understanding and returned demonstration  HOME EXERCISE PROGRAM: Access Code: [email or web address] URL: [email or web address] Date: 10/09/2023 Prepared by: Almetta Fam  Exercises - Squat with Chair Touch  - 1 x daily - 7 x weekly - 2 sets - 10 reps - Seated Piriformis Stretch  - 1 x daily - 7 x weekly - 2 reps - 15 hold - Supine Single Knee to Chest Stretch  - 1 x daily - 7 x weekly - 2 reps - 15 hold - Supine Bridge  - 1 x daily - 7 x weekly - 2 sets - 10 reps - Theatre manager with Chair and Counter Support  - 1 x daily - 7 x weekly - 2 reps - 15 hold  ASSESSMENT:  CLINICAL IMPRESSION: Patient is a 73 y.o. male who was seen today for physical therapy evaluation and treatment for bilateral numbness in thighs. He reports feeling it when he stands for long periods of time, for example when he is in church. This has been going on for over 2 years now and it does not seem to interfere with his life and he has no pain. We discussed etiology possibilities of nerve compression or reduced blood flow when standing too long. Otherwise, he presents with good overall mobility, range, and strength. We agreed that due to the absence of functional deficits physical therapy services are not warranted at this time. He was given a HEP to work on stretching for the hips, back, and quads. Pt encouraged to call back with any specific changes or development of limitations during functional activities, as well as to continue follow-up with physician, as needed.?    REHAB POTENTIAL: Excellent  CLINICAL DECISION MAKING: Stable/uncomplicated  EVALUATION COMPLEXITY: Low   GOALS: Goals reviewed with patient? Yes  SHORT TERM GOALS: Target date: 10/09/23 Pt will verbalize understanding of role/purpose of physical therapy services and be able to independently identify if/when she may need to seek an add'l referral for services to assist w/ participation in functional activities Baseline: Goal status: MET    PLAN:  PT FREQUENCY: one time visit  PT DURATION: other: 1x visit  PLANNED INTERVENTIONS: [id number]- Self  Care  PLAN FOR NEXT SESSION:    Almetta Fam, PT 10/09/2023, 10:52 AM

## 2023-10-09 ENCOUNTER — Ambulatory Visit: Attending: Nurse Practitioner

## 2023-10-09 DIAGNOSIS — M79651 Pain in right thigh: Secondary | ICD-10-CM | POA: Diagnosis not present

## 2023-10-09 DIAGNOSIS — M79652 Pain in left thigh: Secondary | ICD-10-CM | POA: Insufficient documentation

## 2023-10-12 DIAGNOSIS — D472 Monoclonal gammopathy: Secondary | ICD-10-CM | POA: Diagnosis not present

## 2023-10-12 DIAGNOSIS — I129 Hypertensive chronic kidney disease with stage 1 through stage 4 chronic kidney disease, or unspecified chronic kidney disease: Secondary | ICD-10-CM | POA: Diagnosis not present

## 2023-10-12 DIAGNOSIS — M109 Gout, unspecified: Secondary | ICD-10-CM | POA: Diagnosis not present

## 2023-10-12 DIAGNOSIS — E785 Hyperlipidemia, unspecified: Secondary | ICD-10-CM | POA: Diagnosis not present

## 2023-10-12 DIAGNOSIS — N1831 Chronic kidney disease, stage 3a: Secondary | ICD-10-CM | POA: Diagnosis not present

## 2023-10-12 DIAGNOSIS — N179 Acute kidney failure, unspecified: Secondary | ICD-10-CM | POA: Diagnosis not present

## 2023-10-16 ENCOUNTER — Encounter: Payer: Self-pay | Admitting: Hematology and Oncology

## 2023-10-17 ENCOUNTER — Other Ambulatory Visit: Payer: Self-pay | Admitting: Hematology and Oncology

## 2023-10-18 ENCOUNTER — Other Ambulatory Visit: Payer: Self-pay | Admitting: Hematology and Oncology

## 2023-10-18 ENCOUNTER — Telehealth: Payer: Self-pay | Admitting: *Deleted

## 2023-10-18 ENCOUNTER — Telehealth: Payer: Self-pay

## 2023-10-18 NOTE — Telephone Encounter (Signed)
 TCT patient regarding appts. No answer but was able to leave vm message for him and will send MyChart message as well. Dr. Federico has ordered IV iron and pt to have labs re-checked in 6 weeks from 2nd IV iron infusion.SABRA

## 2023-10-18 NOTE — Telephone Encounter (Signed)
 Dr. Federico, patient will be scheduled as soon as possible.  Auth Submission: NO AUTH NEEDED Site of care: Site of care: [facility] Payer: [employer] medicare Medication & CPT/J Code(s) submitted: Feraheme (ferumoxytol) [id number] Diagnosis Code:  Route of submission (phone, fax, portal):  Phone # Fax # Auth type: Buy/Bill PB Units/visits requested: 510mg  x 2 doses Reference number:  Approval from: 10/18/23 to 02/17/24

## 2023-10-23 ENCOUNTER — Ambulatory Visit (INDEPENDENT_AMBULATORY_CARE_PROVIDER_SITE_OTHER)

## 2023-10-23 VITALS — BP 114/70 | HR 55 | Temp 97.8°F | Resp 16 | Ht 67.0 in | Wt 258.2 lb

## 2023-10-23 DIAGNOSIS — D509 Iron deficiency anemia, unspecified: Secondary | ICD-10-CM | POA: Diagnosis not present

## 2023-10-23 DIAGNOSIS — D500 Iron deficiency anemia secondary to blood loss (chronic): Secondary | ICD-10-CM

## 2023-10-23 MED ORDER — SODIUM CHLORIDE 0.9 % IV SOLN
510.0000 mg | Freq: Once | INTRAVENOUS | Status: AC
Start: 2023-10-23 — End: 2023-10-23
  Administered 2023-10-23 (×2): 510 mg via INTRAVENOUS
  Filled 2023-10-23: qty 17

## 2023-10-23 NOTE — Progress Notes (Signed)
 Diagnosis: Iron Deficiency Anemia  Provider:  Praveen Mannam MD  Procedure: IV Infusion  IV Type: Peripheral, IV Location: R Antecubital  Feraheme (Ferumoxytol ), Dose: 510 mg  Infusion Start Time: 1010  Infusion Stop Time: 1028  Post Infusion IV Care: Observation period completed and Peripheral IV Discontinued  Discharge: Condition: Good, Destination: Home . AVS Declined  Performed by:  Leita FORBES Miles, LPN

## 2023-10-25 ENCOUNTER — Other Ambulatory Visit: Payer: Medicare Other

## 2023-10-29 ENCOUNTER — Encounter: Payer: Self-pay | Admitting: Nurse Practitioner

## 2023-10-29 ENCOUNTER — Ambulatory Visit (INDEPENDENT_AMBULATORY_CARE_PROVIDER_SITE_OTHER): Admitting: Nurse Practitioner

## 2023-10-29 VITALS — BP 132/76 | HR 62 | Temp 97.7°F | Ht 67.0 in | Wt 260.2 lb

## 2023-10-29 DIAGNOSIS — I10 Essential (primary) hypertension: Secondary | ICD-10-CM

## 2023-10-29 NOTE — Progress Notes (Signed)
                Established Patient Visit  Patient: Calvin [name].   DOB: May 23, 1950   73 y.o. Male  MRN: [id number] Visit Date: 10/29/2023  Subjective:    Chief Complaint  Patient presents with   Follow-up    4 week follow up for HTN    HPI Essential hypertension Home BP remained <140/80 Resolved lightheadness Current use of verapamil  and irbesartan  He had appointment with nephrology and has upcoming lab appointment BP Readings from Last 3 Encounters:  10/29/23 132/76  10/23/23 114/70  09/17/23 126/74    Maintain med doses and apt with nephrology F/up with 3months  Wt Readings from Last 3 Encounters:  10/29/23 260 lb 3.2 oz (118 kg)  10/23/23 258 lb 3.2 oz (117.1 kg)  09/17/23 250 lb 12.8 oz (113.8 kg)    Reviewed medical, surgical, and social history today  Medications: Outpatient Medications Prior to Visit  Medication Sig   acetaminophen (TYLENOL) 325 MG tablet Take 650 mg by mouth every 6 (six) hours as needed.   allopurinol  (ZYLOPRIM ) 300 MG tablet TAKE 1 TABLET BY MOUTH DAILY   aspirin 81 MG tablet Take 81 mg by mouth daily.   diclofenac  Sodium (VOLTAREN ) 1 % GEL Apply topically 4 (four) times daily. Pt uses as needed.   fenofibrate  54 MG tablet TAKE 1 TABLET BY MOUTH DAILY   finasteride  (PROSCAR ) 5 MG tablet TAKE 1 TABLET BY MOUTH DAILY   irbesartan  (AVAPRO ) 300 MG tablet TAKE 1 TABLET BY MOUTH DAILY   rosuvastatin  (CRESTOR ) 20 MG tablet TAKE 1 TABLET BY MOUTH DAILY   SPIKEVAX syringe    tamsulosin  (FLOMAX ) 0.4 MG CAPS capsule TAKE 1 CAPSULE BY MOUTH DAILY   verapamil  (CALAN -SR) 240 MG CR tablet TAKE 1 TABLET BY MOUTH AT  BEDTIME   No facility-administered medications prior to visit.   Reviewed past medical and social history.   ROS per HPI above      Objective:  BP 132/76 (BP Location: Left Arm, Patient Position: Sitting, Cuff Size: Large)   Pulse 62   Temp 97.7 F (36.5 C) (Oral)   Ht 5' 7 (1.702 m)   Wt 260 lb 3.2 oz (118 kg)   SpO2 97%    BMI 40.75 kg/m      Physical Exam Vitals and nursing note reviewed.  Cardiovascular:     Rate and Rhythm: Normal rate.     Pulses: Normal pulses.  Pulmonary:     Effort: Pulmonary effort is normal.  Musculoskeletal:     Right lower leg: No edema.     Left lower leg: No edema.  Neurological:     Mental Status: He is alert and oriented to person, place, and time.     No results found for any visits on 10/29/23.    Assessment & Plan:    Problem List Items Addressed This Visit     Essential hypertension - Primary   Home BP remained <140/80 Resolved lightheadness Current use of verapamil  and irbesartan  He had appointment with nephrology and has upcoming lab appointment BP Readings from Last 3 Encounters:  10/29/23 132/76  10/23/23 114/70  09/17/23 126/74    Maintain med doses and apt with nephrology F/up with 3months      Return in about 3 months (around 01/29/2024) for prediabetes, HTN, hyperlipidemia (fasting).     Roselie Mood, NP

## 2023-10-29 NOTE — Patient Instructions (Signed)
 Maintain Heart healthy diet and daily exercise. Maintain current medications.

## 2023-10-29 NOTE — Assessment & Plan Note (Addendum)
 Home BP remained <140/80 Resolved lightheadness Current use of verapamil  and irbesartan  He had appointment with nephrology and has upcoming lab appointment BP Readings from Last 3 Encounters:  10/29/23 132/76  10/23/23 114/70  09/17/23 126/74    Maintain med doses and apt with nephrology F/up with 3months

## 2023-10-30 ENCOUNTER — Ambulatory Visit (INDEPENDENT_AMBULATORY_CARE_PROVIDER_SITE_OTHER)

## 2023-10-30 VITALS — BP 128/75 | HR 59 | Temp 97.8°F | Resp 18 | Ht 67.0 in | Wt 260.0 lb

## 2023-10-30 DIAGNOSIS — D509 Iron deficiency anemia, unspecified: Secondary | ICD-10-CM | POA: Diagnosis not present

## 2023-10-30 DIAGNOSIS — D500 Iron deficiency anemia secondary to blood loss (chronic): Secondary | ICD-10-CM

## 2023-10-30 MED ORDER — SODIUM CHLORIDE 0.9 % IV SOLN
510.0000 mg | Freq: Once | INTRAVENOUS | Status: AC
  Administered 2023-10-30: 510 mg via INTRAVENOUS
  Filled 2023-10-30: qty 17

## 2023-10-30 NOTE — Progress Notes (Signed)
 Diagnosis: Iron Deficiency Anemia  Provider:  Praveen Mannam MD  Procedure: IV Infusion  IV Type: Peripheral, IV Location: R Antecubital  Feraheme (Ferumoxytol ), Dose: 510 mg  Infusion Start Time: 1019  Infusion Stop Time: 1025  Post Infusion IV Care: Patient declined observation and Peripheral IV Discontinued  Discharge: Condition: Good, Destination: Home . AVS Declined  Performed by:  Maximiano JONELLE Pouch, LPN

## 2023-11-09 DIAGNOSIS — N1831 Chronic kidney disease, stage 3a: Secondary | ICD-10-CM | POA: Diagnosis not present

## 2023-12-06 ENCOUNTER — Other Ambulatory Visit: Payer: Self-pay | Admitting: Physician Assistant

## 2023-12-06 ENCOUNTER — Inpatient Hospital Stay: Attending: Hematology and Oncology

## 2023-12-06 DIAGNOSIS — D696 Thrombocytopenia, unspecified: Secondary | ICD-10-CM | POA: Insufficient documentation

## 2023-12-06 DIAGNOSIS — D500 Iron deficiency anemia secondary to blood loss (chronic): Secondary | ICD-10-CM

## 2023-12-06 DIAGNOSIS — D649 Anemia, unspecified: Secondary | ICD-10-CM | POA: Diagnosis not present

## 2023-12-06 DIAGNOSIS — D472 Monoclonal gammopathy: Secondary | ICD-10-CM | POA: Insufficient documentation

## 2023-12-06 LAB — CBC WITH DIFFERENTIAL (CANCER CENTER ONLY)
Abs Immature Granulocytes: 0.01 K/uL (ref 0.00–0.07)
Basophils Absolute: 0 K/uL (ref 0.0–0.1)
Basophils Relative: 1 %
Eosinophils Absolute: 0 K/uL (ref 0.0–0.5)
Eosinophils Relative: 1 %
HCT: 35.7 % — ABNORMAL LOW (ref 39.0–52.0)
Hemoglobin: 12 g/dL — ABNORMAL LOW (ref 13.0–17.0)
Immature Granulocytes: 0 %
Lymphocytes Relative: 41 %
Lymphs Abs: 1.3 K/uL (ref 0.7–4.0)
MCH: 30.8 pg (ref 26.0–34.0)
MCHC: 33.6 g/dL (ref 30.0–36.0)
MCV: 91.8 fL (ref 80.0–100.0)
Monocytes Absolute: 0.3 K/uL (ref 0.1–1.0)
Monocytes Relative: 9 %
Neutro Abs: 1.5 K/uL — ABNORMAL LOW (ref 1.7–7.7)
Neutrophils Relative %: 48 %
Platelet Count: 146 K/uL — ABNORMAL LOW (ref 150–400)
RBC: 3.89 MIL/uL — ABNORMAL LOW (ref 4.22–5.81)
RDW: 13.5 % (ref 11.5–15.5)
WBC Count: 3.1 K/uL — ABNORMAL LOW (ref 4.0–10.5)
nRBC: 0 % (ref 0.0–0.2)

## 2023-12-06 LAB — CMP (CANCER CENTER ONLY)
ALT: 9 U/L (ref 0–44)
AST: 17 U/L (ref 15–41)
Albumin: 4.5 g/dL (ref 3.5–5.0)
Alkaline Phosphatase: 44 U/L (ref 38–126)
Anion gap: 3 — ABNORMAL LOW (ref 5–15)
BUN: 18 mg/dL (ref 8–23)
CO2: 30 mmol/L (ref 22–32)
Calcium: 9.3 mg/dL (ref 8.9–10.3)
Chloride: 110 mmol/L (ref 98–111)
Creatinine: 1.57 mg/dL — ABNORMAL HIGH (ref 0.61–1.24)
GFR, Estimated: 46 mL/min — ABNORMAL LOW (ref >60)
Glucose, Bld: 85 mg/dL (ref 70–99)
Potassium: 4 mmol/L (ref 3.5–5.1)
Sodium: 143 mmol/L (ref 135–145)
Total Bilirubin: 0.5 mg/dL (ref 0.0–1.2)
Total Protein: 7.3 g/dL (ref 6.5–8.1)

## 2023-12-06 LAB — FERRITIN: Ferritin: 93 ng/mL (ref 24–336)

## 2023-12-06 LAB — IRON AND IRON BINDING CAPACITY (CC-WL,HP ONLY)
Iron: 95 ug/dL (ref 45–182)
Saturation Ratios: 26 % (ref 17.9–39.5)
TIBC: 372 ug/dL (ref 250–450)
UIBC: 277 ug/dL (ref 117–376)

## 2023-12-11 ENCOUNTER — Ambulatory Visit: Payer: Self-pay | Admitting: Physician Assistant

## 2023-12-14 DIAGNOSIS — G4733 Obstructive sleep apnea (adult) (pediatric): Secondary | ICD-10-CM | POA: Diagnosis not present

## 2023-12-19 ENCOUNTER — Encounter: Payer: Self-pay | Admitting: Nurse Practitioner

## 2023-12-19 ENCOUNTER — Ambulatory Visit (INDEPENDENT_AMBULATORY_CARE_PROVIDER_SITE_OTHER): Admitting: Nurse Practitioner

## 2023-12-19 VITALS — BP 136/78 | HR 64 | Temp 97.4°F | Ht 68.0 in | Wt 254.8 lb

## 2023-12-19 DIAGNOSIS — I10 Essential (primary) hypertension: Secondary | ICD-10-CM

## 2023-12-19 DIAGNOSIS — E049 Nontoxic goiter, unspecified: Secondary | ICD-10-CM | POA: Insufficient documentation

## 2023-12-19 DIAGNOSIS — I517 Cardiomegaly: Secondary | ICD-10-CM | POA: Diagnosis not present

## 2023-12-19 DIAGNOSIS — I2510 Atherosclerotic heart disease of native coronary artery without angina pectoris: Secondary | ICD-10-CM | POA: Diagnosis not present

## 2023-12-19 NOTE — Assessment & Plan Note (Addendum)
 CT coronary calcium  scan completed by [facility] in [place], [place] on 12/04/2023.(Total score at 1310, RCA 576, L. Circumflex 451, LAD 283) He denies any CP or SOB. No tobacco use, occassional Etoh consumption, no hx of DIABETES. We discussed need for cardiology referral to evaluate need for a coronary angiogram. We discussed need to maintain BP<130/80, hgbA1c <5.7%, and LDL <70. He declined referral to cardiology at this time. I encourage to maintain current dose of verapamil , irbesartan , and crestor . Also encouraged to maintain a mediterranean diet and daily exercise.

## 2023-12-19 NOTE — Assessment & Plan Note (Signed)
 Noted on CT chest completed on 12/04/2023 by [facility] in [place], [place] He denies any SOB or CP or LE edema or palpitaions.  He agreed to echocardiogram order.

## 2023-12-19 NOTE — Patient Instructions (Signed)
 Maintain upcoming appointment You will be contacted to schedule appointment for echocardiogram and thyroid  US 

## 2023-12-19 NOTE — Progress Notes (Signed)
 Established Patient Visit  Patient: Calvin [name].   DOB: November 08, 1950   73 y.o. Male  MRN: [id number] Visit Date: 12/19/2023  Subjective:    Chief Complaint  Patient presents with   Follow-up    Discuss recent scans    HPI Coronary artery calcification seen on CT scan CT coronary calcium  scan completed by [facility] in [place], [place] on 12/04/2023.(Total score at 1310, RCA 576, L. Circumflex 451, LAD 283) He denies any CP or SOB. No tobacco use, occassional Etoh consumption, no hx of DIABETES. We discussed need for cardiology referral to evaluate need for a coronary angiogram. We discussed need to maintain BP<130/80, hgbA1c <5.7%, and LDL <70. He declined referral to cardiology at this time. I encourage to maintain current dose of verapamil , irbesartan , and crestor . Also encouraged to maintain a mediterranean diet and daily exercise.   Cardiomegaly Noted on CT chest completed on 12/04/2023 by [facility] in [place], [place] He denies any SOB or CP or LE edema or palpitaions.  He agreed to echocardiogram order.  Enlarged thyroid  Noted on CT chest 12/04/2023 Tsh normal on 10/04/2023  He agreed to thyroid  US   Reviewed medical, surgical, and social history today  Medications: Outpatient Medications Prior to Visit  Medication Sig   acetaminophen (TYLENOL) 325 MG tablet Take 650 mg by mouth every 6 (six) hours as needed.   allopurinol  (ZYLOPRIM ) 300 MG tablet TAKE 1 TABLET BY MOUTH DAILY   aspirin 81 MG tablet Take 81 mg by mouth daily.   diclofenac  Sodium (VOLTAREN ) 1 % GEL Apply topically 4 (four) times daily. Pt uses as needed.   fenofibrate  54 MG tablet TAKE 1 TABLET BY MOUTH DAILY   finasteride  (PROSCAR ) 5 MG tablet TAKE 1 TABLET BY MOUTH DAILY   irbesartan  (AVAPRO ) 300 MG tablet TAKE 1 TABLET BY MOUTH DAILY   rosuvastatin  (CRESTOR ) 20 MG tablet TAKE 1 TABLET BY MOUTH DAILY   SPIKEVAX syringe    tamsulosin  (FLOMAX ) 0.4 MG CAPS capsule TAKE 1 CAPSULE BY  MOUTH DAILY   verapamil  (CALAN -SR) 240 MG CR tablet TAKE 1 TABLET BY MOUTH AT  BEDTIME   No facility-administered medications prior to visit.   Reviewed past medical and social history.   ROS per HPI above      Objective:  BP 136/78 (BP Location: Left Arm, Patient Position: Sitting, Cuff Size: Large)   Pulse 64   Temp (!) 97.4 F (36.3 C) (Oral)   Ht 5' 8 (1.727 m)   Wt 254 lb 12.8 oz (115.6 kg)   SpO2 97%   BMI 38.74 kg/m      Physical Exam  No results found for any visits on 12/19/23.    Assessment & Plan:    Problem List Items Addressed This Visit     Cardiomegaly   Noted on CT chest completed on 12/04/2023 by [facility] in [place], [place] He denies any SOB or CP or LE edema or palpitaions.  He agreed to echocardiogram order.      Relevant Orders   ECHOCARDIOGRAM COMPLETE   Coronary artery calcification seen on CT scan - Primary   CT coronary calcium  scan completed by [facility] in [place], [place] on 12/04/2023.(Total score at 1310, RCA 576, L. Circumflex 451, LAD 283) He denies any CP or SOB. No tobacco use, occassional Etoh consumption, no hx of DIABETES. We discussed need for cardiology referral to evaluate  need for a coronary angiogram. We discussed need to maintain BP<130/80, hgbA1c <5.7%, and LDL <70. He declined referral to cardiology at this time. I encourage to maintain current dose of verapamil , irbesartan , and crestor . Also encouraged to maintain a mediterranean diet and daily exercise.       Relevant Orders   ECHOCARDIOGRAM COMPLETE   Enlarged thyroid    Noted on CT chest 12/04/2023 Tsh normal on 10/04/2023  He agreed to thyroid  US       Relevant Orders   US  THYROID    Essential hypertension   Relevant Orders   ECHOCARDIOGRAM COMPLETE   Return for maintain upcoming appt.     Roselie Mood, NP

## 2023-12-19 NOTE — Assessment & Plan Note (Signed)
 Noted on CT chest 12/04/2023 Tsh normal on 10/04/2023  He agreed to thyroid  US 

## 2023-12-25 ENCOUNTER — Ambulatory Visit: Payer: Self-pay | Admitting: Nurse Practitioner

## 2023-12-25 ENCOUNTER — Ambulatory Visit
Admission: RE | Admit: 2023-12-25 | Discharge: 2023-12-25 | Disposition: A | Discharge status: Home / Self Care | Source: Ambulatory Visit | Attending: Nurse Practitioner | Admitting: Nurse Practitioner

## 2023-12-25 DIAGNOSIS — E049 Nontoxic goiter, unspecified: Secondary | ICD-10-CM | POA: Diagnosis not present

## 2024-01-14 NOTE — Progress Notes (Signed)
 [facility] [name] [name] [address] [place] [address] Phone: [phone]   Assessment and Plan:     1. Primary osteoarthritis of both knees (Primary) 2. Chronic pain of both knees -Chronic with exacerbation, subsequent visit - Consistent with recurrent flare of bilateral knee osteoarthritis without specific MOI - Patient has had significant improvement from intra-articular CSI in the past with last CSI on 07/23/2023 providing nearly 6 months relief.  Patient elected for repeat intra-articular CSI.  Tolerated well per note below.  CSI may temporarily increase blood pressure in patient past medical history of hypertension - Use Tylenol 500 to 1000 mg tablets 2-3 times a day for day-to-day pain relief - Continue HEP for knees   Procedure: Knee Joint Injection Side: Bilateral  Indication: Flare of osteoarthritis  Risks explained and consent was given verbally. The site was cleaned with alcohol prep. A needle was introduced with an anterio-lateral approach. Injection given using 2mL of 1% lidocaine without epinephrine  and 1mL of kenalog  40mg /ml. This was well tolerated.  Procedure was repeated on contralateral side.  Needle was removed, hemostasis achieved, and post injection instructions were explained.   Pt was advised to call or return to clinic if these symptoms worsen or fail to improve as anticipated.   Pertinent previous records reviewed include none   Follow Up: As needed if no improvement or worsening of symptoms.  Could consider repeat intra-articular CSI   Subjective:   I, Calvin [name], am serving as a neurosurgeon for Doctor Morene Mace   Chief Complaint: bilat knee pain    HPI:  03/14/22 Patient is a 73 year old male complaining of right knee pain. Patient states that he dropped a weight on his quad , pain radiates down to his patellar tendon, voltaren  gel helped , that was a few days ago, states he is still swollen, due to  the swelling it felt like his joint  was ripping and separating when he was walking and then it hurt when he put the leg down , no numbness or tingling, states he was doing alright but when he dropped that weight he regressed    05/03/2022 Patient states that he is good great improvement      07/23/2023 Patient is a 73 year old male with bilat knee pain. Patient state  he is about to go on a trip for about a month and he just wants to check up. States his knees are gingerly   01/21/2024 Patient states he is ready for CSI    Relevant Historical Information: Gout, hypertension  Additional pertinent review of systems negative.   Current Outpatient Medications:    acetaminophen (TYLENOL) 325 MG tablet, Take 650 mg by mouth every 6 (six) hours as needed., Disp: , Rfl:    allopurinol  (ZYLOPRIM ) 300 MG tablet, TAKE 1 TABLET BY MOUTH DAILY, Disp: 100 tablet, Rfl: 2   aspirin 81 MG tablet, Take 81 mg by mouth daily., Disp: , Rfl:    diclofenac  Sodium (VOLTAREN ) 1 % GEL, Apply topically 4 (four) times daily. Pt uses as needed., Disp: , Rfl:    fenofibrate  54 MG tablet, TAKE 1 TABLET BY MOUTH DAILY, Disp: 100 tablet, Rfl: 2   finasteride  (PROSCAR ) 5 MG tablet, TAKE 1 TABLET BY MOUTH DAILY, Disp: 100 tablet, Rfl: 2   irbesartan  (AVAPRO ) 300 MG tablet, TAKE 1 TABLET BY MOUTH DAILY, Disp: 100 tablet, Rfl: 2   rosuvastatin  (CRESTOR ) 20 MG tablet, TAKE 1 TABLET BY MOUTH  DAILY, Disp: 100 tablet, Rfl: 2   SPIKEVAX syringe, , Disp: , Rfl:    tamsulosin  (FLOMAX ) 0.4 MG CAPS capsule, TAKE 1 CAPSULE BY MOUTH DAILY, Disp: 100 capsule, Rfl: 2   verapamil  (CALAN -SR) 240 MG CR tablet, TAKE 1 TABLET BY MOUTH AT  BEDTIME, Disp: 100 tablet, Rfl: 2   Objective:     Vitals:   01/21/24 1447  Pulse: 67  SpO2: 97%  Weight: 262 lb (118.8 kg)  Height: 5' 8 (1.727 m)      Body mass index is 39.84 kg/m.    Physical Exam:    General:  awake, alert oriented, no acute distress nontoxic Skin: no suspicious  lesions or rashes Neuro:sensation intact and strength 5/5 with no deficits, no atrophy, normal muscle tone Psych: No signs of anxiety, depression or other mood disorder   Bilateral knee: No swelling No deformity Neg fluid wave, joint milking ROM Flex 90, Ext 30 NTTP over the quad tendon, medial fem condyle, lat fem condyle, patella, plica, patella tendon, tibial tuberostiy, fibular head, posterior fossa, pes anserine bursa, gerdy's tubercle, medial jt line, lateral jt line Neg anterior and posterior drawer Neg lachman Neg sag sign Negative varus stress Negative valgus stress Negative McMurray for painful palpable pop, though did reproduce pain    Gait normal   Electronically signed by:  Odis Mace [name] 3:04 PM 01/21/24

## 2024-01-21 ENCOUNTER — Ambulatory Visit: Admitting: Sports Medicine

## 2024-01-21 VITALS — HR 67 | Ht 68.0 in | Wt 262.0 lb

## 2024-01-21 DIAGNOSIS — G8929 Other chronic pain: Secondary | ICD-10-CM

## 2024-01-21 DIAGNOSIS — M25562 Pain in left knee: Secondary | ICD-10-CM | POA: Diagnosis not present

## 2024-01-21 DIAGNOSIS — M25561 Pain in right knee: Secondary | ICD-10-CM

## 2024-01-21 DIAGNOSIS — M170 Bilateral primary osteoarthritis of knee: Secondary | ICD-10-CM

## 2024-01-22 ENCOUNTER — Ambulatory Visit (HOSPITAL_COMMUNITY)
Admission: RE | Admit: 2024-01-22 | Discharge: 2024-01-22 | Disposition: A | Source: Ambulatory Visit | Attending: Nurse Practitioner

## 2024-01-22 DIAGNOSIS — I2510 Atherosclerotic heart disease of native coronary artery without angina pectoris: Secondary | ICD-10-CM | POA: Insufficient documentation

## 2024-01-22 DIAGNOSIS — G4730 Sleep apnea, unspecified: Secondary | ICD-10-CM | POA: Diagnosis present

## 2024-01-22 DIAGNOSIS — I119 Hypertensive heart disease without heart failure: Secondary | ICD-10-CM | POA: Insufficient documentation

## 2024-01-22 DIAGNOSIS — I517 Cardiomegaly: Secondary | ICD-10-CM

## 2024-01-22 DIAGNOSIS — I10 Essential (primary) hypertension: Secondary | ICD-10-CM

## 2024-01-22 DIAGNOSIS — I358 Other nonrheumatic aortic valve disorders: Secondary | ICD-10-CM | POA: Diagnosis not present

## 2024-01-22 LAB — ECHOCARDIOGRAM COMPLETE
AR max vel: 2.33 cm2
AV Area VTI: 2.84 cm2
AV Area mean vel: 2.32 cm2
AV Mean grad: 5 mmHg
AV Peak grad: 9.6 mmHg
Ao pk vel: 1.55 m/s
Area-P 1/2: 2.81 cm2
S' Lateral: 2.9 cm

## 2024-01-30 ENCOUNTER — Encounter: Payer: Self-pay | Admitting: Nurse Practitioner

## 2024-01-30 ENCOUNTER — Ambulatory Visit: Admitting: Nurse Practitioner

## 2024-01-30 VITALS — BP 142/80 | HR 60 | Temp 97.5°F | Ht 68.0 in | Wt 258.6 lb

## 2024-01-30 DIAGNOSIS — I10 Essential (primary) hypertension: Secondary | ICD-10-CM

## 2024-01-30 DIAGNOSIS — Z6839 Body mass index (BMI) 39.0-39.9, adult: Secondary | ICD-10-CM

## 2024-01-30 DIAGNOSIS — R739 Hyperglycemia, unspecified: Secondary | ICD-10-CM | POA: Diagnosis not present

## 2024-01-30 LAB — POCT GLYCOSYLATED HEMOGLOBIN (HGB A1C): Hemoglobin A1C: 5.7 % — AB (ref 4.0–5.6)

## 2024-01-30 NOTE — Progress Notes (Signed)
 Established Patient Visit  Patient: Calvin [name].   DOB: Aug 10, 1950   73 y.o. Male  MRN: [id number] Visit Date: 01/30/2024  Subjective:    Chief Complaint  Patient presents with   Follow-up    FASTING 3 month follow up for prediabetes HTN, and Hyperlipidemia    HPI Essential hypertension Elevated BP today Reports med dose compliance. Admits to inconsistent DASH diet compliance BP Readings from Last 3 Encounters:  01/30/24 (!) 142/80  12/19/23 136/78  10/30/23 128/75    Advised to maintain DASH diet and daily exercise, Check BP at home in AM, Send BP readings via mychart on Monday., Call office if BP >160/100 1hrs after BP med Bring BP readings and machine to next appointment. F/up in 3months  Hyperglycemia Repeat hgba1c at 5.7% improving. Advised to maintain a low sugar/low carb diet and daily exercise. Repeat in 3months  Obesity Inconsistent with diet. Continues to struglle with sugar craving and high carb snacks Struggles with consistent exercise regimen Wt Readings from Last 3 Encounters:  01/30/24 258 lb 9.6 oz (117.3 kg)  01/21/24 262 lb (118.8 kg)  12/19/23 254 lb 12.8 oz (115.6 kg)   Encouraged to maintain daily exercise: yoga, water aerobics, walking, yoga, chair exercises  etc Advised to eat fresh fruit and vegetables in place sugary snacks   Reviewed medical, surgical, and social history today  Medications: Outpatient Medications Prior to Visit  Medication Sig   acetaminophen (TYLENOL) 325 MG tablet Take 650 mg by mouth every 6 (six) hours as needed.   allopurinol  (ZYLOPRIM ) 300 MG tablet TAKE 1 TABLET BY MOUTH DAILY   aspirin 81 MG tablet Take 81 mg by mouth daily.   diclofenac  Sodium (VOLTAREN ) 1 % GEL Apply topically 4 (four) times daily. Pt uses as needed.   fenofibrate  54 MG tablet TAKE 1 TABLET BY MOUTH DAILY   finasteride  (PROSCAR ) 5 MG tablet TAKE 1 TABLET BY MOUTH DAILY   irbesartan  (AVAPRO ) 300 MG tablet TAKE 1 TABLET BY  MOUTH DAILY   rosuvastatin  (CRESTOR ) 20 MG tablet TAKE 1 TABLET BY MOUTH DAILY   SPIKEVAX syringe    tamsulosin  (FLOMAX ) 0.4 MG CAPS capsule TAKE 1 CAPSULE BY MOUTH DAILY   verapamil  (CALAN -SR) 240 MG CR tablet TAKE 1 TABLET BY MOUTH AT  BEDTIME   No facility-administered medications prior to visit.   Reviewed past medical and social history.   ROS per HPI above      Objective:  BP (!) 142/80 (BP Location: Left Arm, Patient Position: Sitting, Cuff Size: Normal)   Pulse 60   Temp (!) 97.5 F (36.4 C) (Oral)   Ht 5' 8 (1.727 m)   Wt 258 lb 9.6 oz (117.3 kg)   SpO2 99%   BMI 39.32 kg/m      Physical Exam Vitals and nursing note reviewed.  Constitutional:      Appearance: He is obese.  Cardiovascular:     Rate and Rhythm: Normal rate.     Pulses: Normal pulses.  Pulmonary:     Effort: Pulmonary effort is normal.  Musculoskeletal:     Right lower leg: No edema.     Left lower leg: No edema.  Neurological:     Mental Status: He is alert and oriented to person, place, and time.  Psychiatric:        Mood and Affect: Mood normal.        Behavior: Behavior  normal.        Thought Content: Thought content normal.     Results for orders placed or performed in visit on 01/30/24  POCT glycosylated hemoglobin (Hb A1C)  Result Value Ref Range   Hemoglobin A1C 5.7 (A) 4.0 - 5.6 %   HbA1c POC (<> result, manual entry)     HbA1c, POC (prediabetic range)     HbA1c, POC (controlled diabetic range)        Assessment & Plan:    Problem List Items Addressed This Visit     Essential hypertension - Primary   Elevated BP today Reports med dose compliance. Admits to inconsistent DASH diet compliance BP Readings from Last 3 Encounters:  01/30/24 (!) 142/80  12/19/23 136/78  10/30/23 128/75    Advised to maintain DASH diet and daily exercise, Check BP at home in AM, Send BP readings via mychart on Monday., Call office if BP >160/100 1hrs after BP med Bring BP readings and  machine to next appointment. F/up in 3months      Hyperglycemia   Repeat hgba1c at 5.7% improving. Advised to maintain a low sugar/low carb diet and daily exercise. Repeat in 3months      Relevant Orders   POCT glycosylated hemoglobin (Hb A1C) (Completed)   Obesity   Inconsistent with diet. Continues to struglle with sugar craving and high carb snacks Struggles with consistent exercise regimen Wt Readings from Last 3 Encounters:  01/30/24 258 lb 9.6 oz (117.3 kg)  01/21/24 262 lb (118.8 kg)  12/19/23 254 lb 12.8 oz (115.6 kg)   Encouraged to maintain daily exercise: yoga, water aerobics, walking, yoga, chair exercises  etc Advised to eat fresh fruit and vegetables in place sugary snacks      Return in about 3 months (around 05/01/2024) for HTN, prediabetes, hyperlipidemia (fasting).     Roselie Mood, NP

## 2024-01-30 NOTE — Patient Instructions (Addendum)
 Check BP at home in AM Send BP readings via mychart on Monday. Call office if BP >160/100 1hrs after BP med Bring BP readings and machine to next appointment. Maintain a low salt/sodium diet, and daily exercise (e.g. walking 20-43mins daily, calisthenics etc.  DASH Eating Plan DASH stands for Dietary Approaches to Stop Hypertension. The DASH eating plan is a healthy eating plan that has been shown to: Lower high blood pressure (hypertension). Reduce your risk for type 2 diabetes, heart disease, and stroke. Help with weight loss. What are tips for following this plan? Reading food labels Check food labels for the amount of salt (sodium) per serving. Choose foods with less than 5 percent of the Daily Value (DV) of sodium. In general, foods with less than 300 milligrams (mg) of sodium per serving fit into this eating plan. To find whole grains, look for the word whole as the first word in the ingredient list. Shopping Buy products labeled as low-sodium or no salt added. Buy fresh foods. Avoid canned foods and pre-made or frozen meals. Cooking Try not to add salt when you cook. Use salt-free seasonings or herbs instead of table salt or sea salt. Check with your health care provider or pharmacist before using salt substitutes. Do not fry foods. Cook foods in healthy ways, such as baking, boiling, grilling, roasting, or broiling. Cook using oils that are good for your heart. These include olive, canola, avocado, soybean, and sunflower oil. Meal planning  Eat a balanced diet. This should include: 4 or more servings of fruits and 4 or more servings of vegetables each day. Try to fill half of your plate with fruits and vegetables. 6-8 servings of whole grains each day. 6 or less servings of lean meat, poultry, or fish each day. 1 oz is 1 serving. A 3 oz (85 g) serving of meat is about the same size as the palm of your hand. One egg is 1 oz (28 g). 2-3 servings of low-fat dairy each day. One  serving is 1 cup (237 mL). 1 serving of nuts, seeds, or beans 5 times each week. 2-3 servings of heart-healthy fats. Healthy fats called omega-3 fatty acids are found in foods such as walnuts, flaxseeds, fortified milks, and eggs. These fats are also found in cold-water fish, such as sardines, salmon, and mackerel. Limit how much you eat of: Canned or prepackaged foods. Food that is high in trans fat, such as fried foods. Food that is high in saturated fat, such as fatty meat. Desserts and other sweets, sugary drinks, and other foods with added sugar. Full-fat dairy products. Do not salt foods before eating. Do not eat more than 4 egg yolks a week. Try to eat at least 2 vegetarian meals a week. Eat more home-cooked food and less restaurant, buffet, and fast food. Lifestyle When eating at a restaurant, ask if your food can be made with less salt or no salt. If you drink alcohol: Limit how much you have to: 0-1 drink a day if you are male. 0-2 drinks a day if you are male. Know how much alcohol is in your drink. In the [place] one drink is one 12 oz bottle of beer (355 mL), one 5 oz glass of wine (148 mL), or one 1 oz glass of hard liquor (44 mL). General information Avoid eating more than 2,300 mg of salt a day. If you have hypertension, you may need to reduce your sodium intake to 1,500 mg a day. Work with your provider  to stay at a healthy body weight or lose weight. Ask what the best weight range is for you. On most days of the week, get at least 30 minutes of exercise that causes your heart to beat faster. This may include walking, swimming, or biking. Work with your provider or dietitian to adjust your eating plan to meet your specific calorie needs. What foods should I eat? Fruits All fresh, dried, or frozen fruit. Canned fruits that are in their natural juice and do not have sugar added to them. Vegetables Fresh or frozen vegetables that are raw, steamed, roasted, or grilled.  Low-sodium or reduced-sodium tomato and vegetable juice. Low-sodium or reduced-sodium tomato sauce and tomato paste. Low-sodium or reduced-sodium canned vegetables. Grains Whole-grain or whole-wheat bread. Whole-grain or whole-wheat pasta. Brown rice. [place]neil Madeira. Bulgur. Whole-grain and low-sodium cereals. Pita bread. Low-fat, low-sodium crackers. Whole-wheat flour tortillas. Meats and other proteins Skinless chicken or turkey. Ground chicken or turkey. Pork with fat trimmed off. Fish and seafood. Egg whites. Dried beans, peas, or lentils. Unsalted nuts, nut butters, and seeds. Unsalted canned beans. Lean cuts of beef with fat trimmed off. Low-sodium, lean precooked or cured meat, such as sausages or meat loaves. Dairy Low-fat (1%) or fat-free (skim) milk. Reduced-fat, low-fat, or fat-free cheeses. Nonfat, low-sodium ricotta or cottage cheese. Low-fat or nonfat yogurt. Low-fat, low-sodium cheese. Fats and oils Soft margarine without trans fats. Vegetable oil. Reduced-fat, low-fat, or light mayonnaise and salad dressings (reduced-sodium). Canola, safflower, olive, avocado, soybean, and sunflower oils. Avocado. Seasonings and condiments Herbs. Spices. Seasoning mixes without salt. Other foods Unsalted popcorn and pretzels. Fat-free sweets. The items listed above may not be all the foods and drinks you can have. Talk to a dietitian to learn more. What foods should I avoid? Fruits Canned fruit in a light or heavy syrup. Fried fruit. Fruit in cream or butter sauce. Vegetables Creamed or fried vegetables. Vegetables in a cheese sauce. Regular canned vegetables that are not marked as low-sodium or reduced-sodium. Regular canned tomato sauce and paste that are not marked as low-sodium or reduced-sodium. Regular tomato and vegetable juices that are not marked as low-sodium or reduced-sodium. Dene. Olives. Grains Baked goods made with fat, such as croissants, muffins, or some breads. Dry pasta or  rice meal packs. Meats and other proteins Fatty cuts of meat. Ribs. Fried meat. [place]. Bologna, salami, and other precooked or cured meats, such as sausages or meat loaves, that are not lean and low in sodium. Fat from the back of a pig (fatback). Bratwurst. Salted nuts and seeds. Canned beans with added salt. Canned or smoked fish. Whole eggs or egg yolks. Chicken or turkey with skin. Dairy Whole or 2% milk, cream, and half-and-half. Whole or full-fat cream cheese. Whole-fat or sweetened yogurt. Full-fat cheese. Nondairy creamers. Whipped toppings. Processed cheese and cheese spreads. Fats and oils Butter. Stick margarine. Lard. Shortening. Ghee. Bacon fat. Tropical oils, such as coconut, palm kernel, or palm oil. Seasonings and condiments Onion salt, garlic salt, seasoned salt, table salt, and sea salt. Worcestershire sauce. Tartar sauce. Barbecue sauce. Teriyaki sauce. Soy sauce, including reduced-sodium soy sauce. Steak sauce. Canned and packaged gravies. Fish sauce. Oyster sauce. Cocktail sauce. Store-bought horseradish. Ketchup. Mustard. Meat flavorings and tenderizers. Bouillon cubes. Hot sauces. Pre-made or packaged marinades. Pre-made or packaged taco seasonings. Relishes. Regular salad dressings. Other foods Salted popcorn and pretzels. The items listed above may not be all the foods and drinks you should avoid. Talk to a dietitian to learn more. Where to  find more information National Heart, Lung, and Blood Institute (NHLBI): buffalodrycleaner.gl American Heart Association (AHA): heart.org Academy of Nutrition and Dietetics: eatright.org National Kidney Foundation (NKF): kidney.org This information is not intended to replace advice given to you by your health care provider. Make sure you discuss any questions you have with your health care provider. Document Revised: 03/16/2022 Document Reviewed: 03/16/2022 Elsevier Patient Education  2024 [employer].

## 2024-01-30 NOTE — Assessment & Plan Note (Signed)
 Repeat [id number] at 5.7% improving. Advised to maintain a low sugar/low carb diet and daily exercise. Repeat in 3months

## 2024-01-30 NOTE — Assessment & Plan Note (Signed)
 Inconsistent with diet. Continues to struglle with sugar craving and high carb snacks Struggles with consistent exercise regimen Wt Readings from Last 3 Encounters:  01/30/24 258 lb 9.6 oz (117.3 kg)  01/21/24 262 lb (118.8 kg)  12/19/23 254 lb 12.8 oz (115.6 kg)   Encouraged to maintain daily exercise: yoga, water aerobics, walking, yoga, chair exercises  etc Advised to eat fresh fruit and vegetables in place sugary snacks

## 2024-01-30 NOTE — Assessment & Plan Note (Signed)
 Elevated BP today Reports med dose compliance. Admits to inconsistent DASH diet compliance BP Readings from Last 3 Encounters:  01/30/24 (!) 142/80  12/19/23 136/78  10/30/23 128/75    Advised to maintain DASH diet and daily exercise, Check BP at home in AM, Send BP readings via mychart on Monday., Call office if BP >160/100 1hrs after BP med Bring BP readings and machine to next appointment. F/up in 3months

## 2024-02-04 ENCOUNTER — Encounter: Payer: Self-pay | Admitting: Nurse Practitioner

## 2024-04-24 ENCOUNTER — Other Ambulatory Visit: Payer: Medicare Other

## 2024-05-01 ENCOUNTER — Ambulatory Visit: Payer: Medicare Other | Admitting: Hematology and Oncology

## 2024-05-01 ENCOUNTER — Other Ambulatory Visit: Payer: Medicare Other

## 2024-05-02 ENCOUNTER — Ambulatory Visit: Admitting: Nurse Practitioner
